# Patient Record
Sex: Male | Born: 1950 | Race: White | Hispanic: No | Marital: Married | Smoking: Former smoker
Health system: Southern US, Community
[De-identification: ages and names within clinical notes are randomized; demographics above are authoritative.]

## PROBLEM LIST (undated history)

## (undated) DIAGNOSIS — M1990 Unspecified osteoarthritis, unspecified site: Secondary | ICD-10-CM

## (undated) DIAGNOSIS — M109 Gout, unspecified: Secondary | ICD-10-CM

## (undated) DIAGNOSIS — H409 Unspecified glaucoma: Secondary | ICD-10-CM

## (undated) DIAGNOSIS — E7800 Pure hypercholesterolemia, unspecified: Secondary | ICD-10-CM

## (undated) DIAGNOSIS — C801 Malignant (primary) neoplasm, unspecified: Secondary | ICD-10-CM

## (undated) DIAGNOSIS — I10 Essential (primary) hypertension: Secondary | ICD-10-CM

## (undated) DIAGNOSIS — L570 Actinic keratosis: Secondary | ICD-10-CM

## (undated) DIAGNOSIS — C61 Malignant neoplasm of prostate: Secondary | ICD-10-CM

## (undated) DIAGNOSIS — E119 Type 2 diabetes mellitus without complications: Secondary | ICD-10-CM

## (undated) HISTORY — PX: PROSTATE SURGERY: SHX751

## (undated) HISTORY — PX: HERNIA REPAIR: SHX51

## (undated) HISTORY — DX: Unspecified osteoarthritis, unspecified site: M19.90

## (undated) HISTORY — PX: OTHER SURGICAL HISTORY: SHX169

## (undated) HISTORY — PX: MENISCUS REPAIR: SHX5179

## (undated) HISTORY — PX: RETINAL DETACHMENT SURGERY: SHX105

## (undated) HISTORY — DX: Actinic keratosis: L57.0

## (undated) HISTORY — PX: EYE SURGERY: SHX253

---

## 2008-10-13 ENCOUNTER — Ambulatory Visit: Payer: Self-pay | Admitting: General Surgery

## 2010-09-15 HISTORY — PX: ROTATOR CUFF REPAIR: SHX139

## 2010-10-15 ENCOUNTER — Ambulatory Visit: Payer: Self-pay | Admitting: Gastroenterology

## 2012-01-13 ENCOUNTER — Ambulatory Visit: Payer: Self-pay | Admitting: Family Medicine

## 2012-09-15 HISTORY — PX: JOINT REPLACEMENT: SHX530

## 2014-03-03 ENCOUNTER — Ambulatory Visit: Payer: Self-pay | Admitting: Family Medicine

## 2014-12-21 ENCOUNTER — Ambulatory Visit
Admit: 2014-12-21 | Disposition: A | Discharge status: Home / Self Care | Payer: Self-pay | Attending: Gastroenterology | Admitting: Gastroenterology

## 2015-01-16 ENCOUNTER — Other Ambulatory Visit: Payer: Self-pay | Admitting: Urology

## 2015-01-16 DIAGNOSIS — C61 Malignant neoplasm of prostate: Secondary | ICD-10-CM

## 2015-01-19 ENCOUNTER — Ambulatory Visit
Admission: RE | Admit: 2015-01-19 | Discharge: 2015-01-19 | Disposition: A | Discharge status: Home / Self Care | Payer: TRICARE For Life (TFL) | Source: Ambulatory Visit | Attending: Urology | Admitting: Urology

## 2015-01-19 DIAGNOSIS — M5137 Other intervertebral disc degeneration, lumbosacral region: Secondary | ICD-10-CM | POA: Insufficient documentation

## 2015-01-19 DIAGNOSIS — C61 Malignant neoplasm of prostate: Secondary | ICD-10-CM | POA: Diagnosis not present

## 2015-01-19 DIAGNOSIS — M47816 Spondylosis without myelopathy or radiculopathy, lumbar region: Secondary | ICD-10-CM | POA: Diagnosis not present

## 2015-01-19 DIAGNOSIS — K760 Fatty (change of) liver, not elsewhere classified: Secondary | ICD-10-CM | POA: Diagnosis not present

## 2015-01-19 MED ORDER — IOHEXOL 300 MG/ML  SOLN
100.0000 mL | Freq: Once | INTRAMUSCULAR | Status: AC | PRN
  Administered 2015-01-19: 100 mL via INTRAVENOUS

## 2015-01-26 ENCOUNTER — Encounter
Admission: RE | Admit: 2015-01-26 | Discharge: 2015-01-26 | Disposition: A | Discharge status: Home / Self Care | Payer: TRICARE For Life (TFL) | Source: Ambulatory Visit | Attending: Urology | Admitting: Urology

## 2015-01-26 DIAGNOSIS — C61 Malignant neoplasm of prostate: Secondary | ICD-10-CM | POA: Insufficient documentation

## 2015-01-26 HISTORY — DX: Malignant (primary) neoplasm, unspecified: C80.1

## 2015-01-26 HISTORY — DX: Type 2 diabetes mellitus without complications: E11.9

## 2015-01-26 MED ORDER — TECHNETIUM TC 99M MEDRONATE IV KIT
22.9780 | PACK | Freq: Once | INTRAVENOUS | Status: AC | PRN
  Administered 2015-01-26: 22.978 via INTRAVENOUS

## 2015-01-29 ENCOUNTER — Ambulatory Visit: Admission: RE | Admit: 2015-01-29 | Payer: Self-pay | Source: Ambulatory Visit

## 2015-02-07 ENCOUNTER — Institutional Professional Consult (permissible substitution): Payer: TRICARE For Life (TFL) | Admitting: Radiation Oncology

## 2015-02-13 ENCOUNTER — Ambulatory Visit
Admission: RE | Admit: 2015-02-13 | Discharge: 2015-02-13 | Disposition: A | Discharge status: Home / Self Care | Payer: TRICARE For Life (TFL) | Source: Ambulatory Visit | Attending: Radiation Oncology | Admitting: Radiation Oncology

## 2015-02-13 ENCOUNTER — Encounter: Payer: Self-pay | Admitting: Radiation Oncology

## 2015-02-13 VITALS — BP 140/83 | HR 70 | Temp 96.9°F | Resp 18 | Wt 204.8 lb

## 2015-02-13 DIAGNOSIS — Z510 Encounter for antineoplastic radiation therapy: Secondary | ICD-10-CM | POA: Insufficient documentation

## 2015-02-13 DIAGNOSIS — C61 Malignant neoplasm of prostate: Secondary | ICD-10-CM

## 2015-02-13 HISTORY — DX: Unspecified glaucoma: H40.9

## 2015-02-13 HISTORY — DX: Gout, unspecified: M10.9

## 2015-02-13 HISTORY — DX: Essential (primary) hypertension: I10

## 2015-02-13 HISTORY — DX: Malignant neoplasm of prostate: C61

## 2015-02-13 HISTORY — DX: Pure hypercholesterolemia, unspecified: E78.00

## 2015-02-13 NOTE — Consult Note (Signed)
Radiation Oncology NEW PATIENT EVALUATION  Name: Troy Christensen  MRN: [id number]  Date:   02/13/2015     DOB: Jan 25, 1951   This 64 y.o. 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  REFERRING PHYSICIAN: Guadalupe Maple, MD  CHIEF COMPLAINT:  Chief Complaint  Patient presents with  . Prostate Cancer    Pt is here for initial evaluation of prostate cancer.     DIAGNOSIS: The encounter diagnosis was Malignant neoplasm of prostate.   PREVIOUS INVESTIGATIONS:  CT scans reviewed bone scan reviewed Pathology report reviewed Clinical notes reviewed  HPI: Patient is a pleasant 64 year old male who presented to his PMD with an elevated PSA of 6.3 on 11/07/2013. He was seen by urology noted to have some minor lower urinary tract symptoms such as weak stream and urgency nocturia 1-2. He went on to have a transrectal ultrasound-guided biopsy showing bilateral adenocarcinoma mostly Gleason 8 (4+4) with also some Gleason 7 and Gleason 6 scores. He underwent a CT scan showing a 41 cc prostate no evidence of extracapsular spread or evidence of pelvic adenopathy or osseous metastatic disease. Bone scan also demonstrated no evidence of metastatic disease have focus of increased uptake in the proximal left femur corresponding to a benign-appearing lesion on recent CT scan. Patient is seen today for evaluation. He specifically denies any bone pain and again lower urinary tract symptoms as described above.  PLANNED TREATMENT REGIMEN: Image guidedIMRT radiation therapy treating both prostate and pelvic nodes with Lupron suppression therapy  PAST MEDICAL HISTORY:  has a past medical history of Cancer; Diabetes mellitus without complication; Prostate cancer; Glaucoma; Hypertension; Hypercholesteremia; and Gout.    PAST SURGICAL HISTORY:  Past Surgical History  Procedure Laterality Date   . Joint replacement  2014    Right Knee Replacement  . Rotator cuff repair Right 2012    FAMILY HISTORY: family history includes Cancer (age of onset: 47) in his father.  SOCIAL HISTORY:  reports that he has quit smoking. He does not have any smokeless tobacco history on file.  ALLERGIES: Naprosyn  MEDICATIONS:  Current Outpatient Prescriptions  Medication Sig Dispense Refill  . allopurinol (ZYLOPRIM) 100 MG tablet Take 100 mg by mouth daily.    Marland Kitchen amLODipine-benazepril (LOTREL) 5-20 MG per capsule Take 1 capsule by mouth daily.    Marland Kitchen aspirin 81 MG tablet Take 81 mg by mouth daily.    Marland Kitchen lovastatin (MEVACOR) 40 MG tablet Take 40 mg by mouth at bedtime.    . meloxicam (MOBIC) 15 MG tablet Take 15 mg by mouth daily.    . metFORMIN (GLUCOPHAGE) 500 MG tablet Take 1,000 mg by mouth 2 (two) times daily with a meal.    . pioglitazone (ACTOS) 45 MG tablet Take 45 mg by mouth daily.    . prenatal vitamin w/FE, FA (PRENATAL 1 + 1) 27-1 MG TABS tablet Take 1 tablet by mouth daily at 12 noon.    . timolol (TIMOPTIC) 0.5 % ophthalmic solution 1 drop 2 (two) times daily.     No current facility-administered medications for this encounter.    ECOG PERFORMANCE STATUS:  0 - Asymptomatic  REVIEW OF SYSTEMS:  Patient denies any weight loss, fatigue, weakness, fever, chills or night sweats. Patient denies any loss of vision, blurred vision. Patient denies any ringing  of the ears or hearing loss. No irregular heartbeat. Patient denies  heart murmur or history of fainting. Patient denies any chest pain or pain radiating to her upper extremities. Patient denies any shortness of breath, difficulty breathing at night, cough or hemoptysis. Patient denies any swelling in the lower legs. Patient denies any nausea vomiting, vomiting of blood, or coffee ground material in the vomitus. Patient denies any stomach pain. Patient states has had normal bowel movements no significant constipation or diarrhea. Patient  denies any dysuria, hematuria or significant nocturia. Patient denies any problems walking, swelling in the joints or loss of balance. Patient denies any skin changes, loss of hair or loss of weight. Patient denies any excessive worrying or anxiety or significant depression. Patient denies any problems with insomnia. Patient denies excessive thirst, polyuria, polydipsia. Patient denies any swollen glands, patient denies easy bruising or easy bleeding. Patient denies any recent infections, allergies or URI. Patient "s visual fields have not changed significantly in recent time.    PHYSICAL EXAM: BP 140/83 mmHg  Pulse 70  Temp(Src) 96.9 F (36.1 C)  Resp 18  Wt 204 lb 12.9 oz (92.9 kg) Well-developed slightly obese male in NAD. On rectal exam rectal sphincter tone is good prostate is firm no evidence of discrete nodularity is noted. Measures of breath approximate 40 cc prostate. Sulcus is preserved bilaterally. No other rectal abnormality is identified. Well-developed well-nourished patient in NAD. HEENT reveals PERLA, EOMI, discs not visualized.  Oral cavity is clear. No oral mucosal lesions are identified. Neck is clear without evidence of cervical or supraclavicular adenopathy. Lungs are clear to A&P. Cardiac examination is essentially unremarkable with regular rate and rhythm without murmur rub or thrill. Abdomen is benign with no organomegaly or masses noted. Motor sensory and DTR levels are equal and symmetric in the upper and lower extremities. Cranial nerves II through XII are grossly intact. Proprioception is intact. No peripheral adenopathy or edema is identified. No motor or sensory levels are noted. Crude visual fields are within normal range.   LABORATORY DATA:  Surgical pathology report of prostate biopsies are reviewed  RADIOLOGY RESULTS: CT scan and bone scan are reviewed IMPRESSION: Stage IIB adenocarcinoma the prostate in 64 year old male  PLAN: At this time I have run his  clinical markers through the [facility] nomogram which shows approximate 70% chance of extracapsular spread of his disease as well as an 18% chance of pelvic lymph node involvement. I believe radiation therapy with image guided technique andIMRT dose painting technique to treat his prostate 8000 cGy as well as his pelvic lymph nodes to 5400 cGy over 8 weeks. I also would like the patient suppressed on Lupron for about a year and a half and have ordered a 4 month Lupron Depo injection. Risks and benefits of treatment including increased lower urinary tract symptoms including frequency urgency and nocturia, possible diarrhea, fatigue, alteration of blood counts, and discussion of erectile dysfunction all were discussed with the patient and his wife. They both seem to comprehend my treatment plan well. I have asked urology to place gold fiduciary markers for image guided treatment. I have set and ordered CT simulation shortly thereafter and have also ordered a Lupron Depo injection.  I would like to take this opportunity for allowing me to participate in the care of your patient.Armstead Peaks., MD

## 2015-02-14 ENCOUNTER — Other Ambulatory Visit: Payer: Self-pay | Admitting: *Deleted

## 2015-02-14 DIAGNOSIS — C61 Malignant neoplasm of prostate: Secondary | ICD-10-CM

## 2015-02-22 ENCOUNTER — Other Ambulatory Visit: Payer: Self-pay | Admitting: *Deleted

## 2015-02-23 ENCOUNTER — Ambulatory Visit
Admission: RE | Admit: 2015-02-23 | Discharge: 2015-02-23 | Disposition: A | Discharge status: Home / Self Care | Payer: TRICARE For Life (TFL) | Source: Ambulatory Visit | Attending: Radiation Oncology | Admitting: Radiation Oncology

## 2015-02-23 ENCOUNTER — Inpatient Hospital Stay: Payer: TRICARE For Life (TFL) | Attending: Radiation Oncology

## 2015-02-23 DIAGNOSIS — Z79818 Long term (current) use of other agents affecting estrogen receptors and estrogen levels: Secondary | ICD-10-CM | POA: Diagnosis not present

## 2015-02-23 DIAGNOSIS — C61 Malignant neoplasm of prostate: Secondary | ICD-10-CM

## 2015-02-23 DIAGNOSIS — Z510 Encounter for antineoplastic radiation therapy: Secondary | ICD-10-CM | POA: Diagnosis present

## 2015-02-23 MED ORDER — LEUPROLIDE ACETATE (4 MONTH) 30 MG IM KIT
30.0000 mg | PACK | Freq: Once | INTRAMUSCULAR | Status: AC
  Administered 2015-02-23: 30 mg via INTRAMUSCULAR
  Filled 2015-02-23: qty 30

## 2015-03-01 DIAGNOSIS — Z510 Encounter for antineoplastic radiation therapy: Secondary | ICD-10-CM | POA: Diagnosis not present

## 2015-03-02 ENCOUNTER — Other Ambulatory Visit: Payer: Self-pay | Admitting: *Deleted

## 2015-03-02 DIAGNOSIS — C61 Malignant neoplasm of prostate: Secondary | ICD-10-CM

## 2015-03-05 ENCOUNTER — Other Ambulatory Visit: Payer: Self-pay

## 2015-03-05 DIAGNOSIS — A048 Other specified bacterial intestinal infections: Secondary | ICD-10-CM

## 2015-03-06 ENCOUNTER — Ambulatory Visit
Admission: RE | Admit: 2015-03-06 | Discharge: 2015-03-06 | Disposition: A | Discharge status: Home / Self Care | Payer: TRICARE For Life (TFL) | Source: Ambulatory Visit | Attending: Radiation Oncology | Admitting: Radiation Oncology

## 2015-03-06 DIAGNOSIS — Z510 Encounter for antineoplastic radiation therapy: Secondary | ICD-10-CM | POA: Diagnosis not present

## 2015-03-07 ENCOUNTER — Other Ambulatory Visit: Payer: Self-pay | Admitting: Family Medicine

## 2015-03-07 ENCOUNTER — Ambulatory Visit
Admission: RE | Admit: 2015-03-07 | Discharge: 2015-03-07 | Disposition: A | Discharge status: Home / Self Care | Payer: TRICARE For Life (TFL) | Source: Ambulatory Visit | Attending: Radiation Oncology | Admitting: Radiation Oncology

## 2015-03-07 DIAGNOSIS — Z510 Encounter for antineoplastic radiation therapy: Secondary | ICD-10-CM | POA: Diagnosis not present

## 2015-03-07 MED ORDER — LOVASTATIN 40 MG PO TABS: 40.0000 mg | ORAL_TABLET | Freq: Every day | ORAL | Status: DC

## 2015-03-08 ENCOUNTER — Ambulatory Visit
Admission: RE | Admit: 2015-03-08 | Discharge: 2015-03-08 | Disposition: A | Discharge status: Home / Self Care | Payer: TRICARE For Life (TFL) | Source: Ambulatory Visit | Attending: Radiation Oncology | Admitting: Radiation Oncology

## 2015-03-08 DIAGNOSIS — Z510 Encounter for antineoplastic radiation therapy: Secondary | ICD-10-CM | POA: Diagnosis not present

## 2015-03-09 ENCOUNTER — Ambulatory Visit
Admission: RE | Admit: 2015-03-09 | Discharge: 2015-03-09 | Disposition: A | Discharge status: Home / Self Care | Payer: TRICARE For Life (TFL) | Source: Ambulatory Visit | Attending: Radiation Oncology | Admitting: Radiation Oncology

## 2015-03-09 DIAGNOSIS — Z510 Encounter for antineoplastic radiation therapy: Secondary | ICD-10-CM | POA: Diagnosis not present

## 2015-03-12 ENCOUNTER — Ambulatory Visit
Admission: RE | Admit: 2015-03-12 | Discharge: 2015-03-12 | Disposition: A | Discharge status: Home / Self Care | Payer: TRICARE For Life (TFL) | Source: Ambulatory Visit | Attending: Radiation Oncology | Admitting: Radiation Oncology

## 2015-03-12 DIAGNOSIS — Z510 Encounter for antineoplastic radiation therapy: Secondary | ICD-10-CM | POA: Diagnosis not present

## 2015-03-13 ENCOUNTER — Ambulatory Visit
Admission: RE | Admit: 2015-03-13 | Discharge: 2015-03-13 | Disposition: A | Discharge status: Home / Self Care | Payer: TRICARE For Life (TFL) | Source: Ambulatory Visit | Attending: Radiation Oncology | Admitting: Radiation Oncology

## 2015-03-13 DIAGNOSIS — Z510 Encounter for antineoplastic radiation therapy: Secondary | ICD-10-CM | POA: Diagnosis not present

## 2015-03-14 ENCOUNTER — Ambulatory Visit
Admission: RE | Admit: 2015-03-14 | Discharge: 2015-03-14 | Disposition: A | Discharge status: Home / Self Care | Payer: TRICARE For Life (TFL) | Source: Ambulatory Visit | Attending: Radiation Oncology | Admitting: Radiation Oncology

## 2015-03-14 DIAGNOSIS — Z510 Encounter for antineoplastic radiation therapy: Secondary | ICD-10-CM | POA: Diagnosis not present

## 2015-03-15 ENCOUNTER — Ambulatory Visit
Admission: RE | Admit: 2015-03-15 | Discharge: 2015-03-15 | Disposition: A | Discharge status: Home / Self Care | Payer: TRICARE For Life (TFL) | Source: Ambulatory Visit | Attending: Radiation Oncology | Admitting: Radiation Oncology

## 2015-03-15 DIAGNOSIS — Z510 Encounter for antineoplastic radiation therapy: Secondary | ICD-10-CM | POA: Diagnosis not present

## 2015-03-16 ENCOUNTER — Ambulatory Visit
Admission: RE | Admit: 2015-03-16 | Discharge: 2015-03-16 | Disposition: A | Discharge status: Home / Self Care | Payer: TRICARE For Life (TFL) | Source: Ambulatory Visit | Attending: Radiation Oncology | Admitting: Radiation Oncology

## 2015-03-16 DIAGNOSIS — Z510 Encounter for antineoplastic radiation therapy: Secondary | ICD-10-CM | POA: Diagnosis not present

## 2015-03-20 ENCOUNTER — Ambulatory Visit
Admission: RE | Admit: 2015-03-20 | Discharge: 2015-03-20 | Disposition: A | Discharge status: Home / Self Care | Payer: TRICARE For Life (TFL) | Source: Ambulatory Visit | Attending: Radiation Oncology | Admitting: Radiation Oncology

## 2015-03-20 DIAGNOSIS — Z510 Encounter for antineoplastic radiation therapy: Secondary | ICD-10-CM | POA: Diagnosis not present

## 2015-03-21 ENCOUNTER — Inpatient Hospital Stay: Attending: Radiation Oncology

## 2015-03-21 ENCOUNTER — Ambulatory Visit
Admission: RE | Admit: 2015-03-21 | Discharge: 2015-03-21 | Disposition: A | Discharge status: Home / Self Care | Payer: TRICARE For Life (TFL) | Source: Ambulatory Visit | Attending: Radiation Oncology | Admitting: Radiation Oncology

## 2015-03-21 DIAGNOSIS — E669 Obesity, unspecified: Secondary | ICD-10-CM | POA: Insufficient documentation

## 2015-03-21 DIAGNOSIS — C61 Malignant neoplasm of prostate: Secondary | ICD-10-CM | POA: Diagnosis not present

## 2015-03-21 DIAGNOSIS — Z510 Encounter for antineoplastic radiation therapy: Secondary | ICD-10-CM | POA: Diagnosis not present

## 2015-03-21 DIAGNOSIS — E1169 Type 2 diabetes mellitus with other specified complication: Secondary | ICD-10-CM

## 2015-03-21 DIAGNOSIS — M1990 Unspecified osteoarthritis, unspecified site: Secondary | ICD-10-CM

## 2015-03-21 DIAGNOSIS — E785 Hyperlipidemia, unspecified: Secondary | ICD-10-CM

## 2015-03-21 DIAGNOSIS — Z125 Encounter for screening for malignant neoplasm of prostate: Secondary | ICD-10-CM

## 2015-03-21 DIAGNOSIS — IMO0002 Reserved for concepts with insufficient information to code with codable children: Secondary | ICD-10-CM | POA: Insufficient documentation

## 2015-03-21 DIAGNOSIS — D500 Iron deficiency anemia secondary to blood loss (chronic): Secondary | ICD-10-CM

## 2015-03-21 DIAGNOSIS — H40019 Open angle with borderline findings, low risk, unspecified eye: Secondary | ICD-10-CM

## 2015-03-21 DIAGNOSIS — E119 Type 2 diabetes mellitus without complications: Secondary | ICD-10-CM

## 2015-03-21 DIAGNOSIS — M75110 Incomplete rotator cuff tear or rupture of unspecified shoulder, not specified as traumatic: Secondary | ICD-10-CM

## 2015-03-21 DIAGNOSIS — E1159 Type 2 diabetes mellitus with other circulatory complications: Secondary | ICD-10-CM

## 2015-03-21 DIAGNOSIS — I10 Essential (primary) hypertension: Secondary | ICD-10-CM

## 2015-03-21 LAB — CBC
HCT: 39.1 % — ABNORMAL LOW (ref 40.0–52.0)
Hemoglobin: 12.8 g/dL — ABNORMAL LOW (ref 13.0–18.0)
MCH: 29.5 pg (ref 26.0–34.0)
MCHC: 32.8 g/dL (ref 32.0–36.0)
MCV: 89.9 fL (ref 80.0–100.0)
PLATELETS: 150 10*3/uL (ref 150–440)
RBC: 4.35 MIL/uL — AB (ref 4.40–5.90)
RDW: 17.6 % — ABNORMAL HIGH (ref 11.5–14.5)
WBC: 3 10*3/uL — AB (ref 3.8–10.6)

## 2015-03-22 ENCOUNTER — Ambulatory Visit
Admission: RE | Admit: 2015-03-22 | Discharge: 2015-03-22 | Disposition: A | Discharge status: Home / Self Care | Payer: TRICARE For Life (TFL) | Source: Ambulatory Visit | Attending: Radiation Oncology | Admitting: Radiation Oncology

## 2015-03-22 DIAGNOSIS — Z510 Encounter for antineoplastic radiation therapy: Secondary | ICD-10-CM | POA: Diagnosis not present

## 2015-03-23 ENCOUNTER — Ambulatory Visit
Admission: RE | Admit: 2015-03-23 | Discharge: 2015-03-23 | Disposition: A | Discharge status: Home / Self Care | Payer: TRICARE For Life (TFL) | Source: Ambulatory Visit | Attending: Radiation Oncology | Admitting: Radiation Oncology

## 2015-03-23 DIAGNOSIS — Z510 Encounter for antineoplastic radiation therapy: Secondary | ICD-10-CM | POA: Diagnosis not present

## 2015-03-26 ENCOUNTER — Ambulatory Visit
Admission: RE | Admit: 2015-03-26 | Discharge: 2015-03-26 | Disposition: A | Discharge status: Home / Self Care | Payer: TRICARE For Life (TFL) | Source: Ambulatory Visit | Attending: Radiation Oncology | Admitting: Radiation Oncology

## 2015-03-26 ENCOUNTER — Ambulatory Visit (INDEPENDENT_AMBULATORY_CARE_PROVIDER_SITE_OTHER): Payer: TRICARE For Life (TFL) | Admitting: Family Medicine

## 2015-03-26 ENCOUNTER — Encounter: Payer: Self-pay | Admitting: Family Medicine

## 2015-03-26 VITALS — BP 122/76 | HR 60 | Temp 98.6°F | Ht 70.0 in | Wt 197.0 lb

## 2015-03-26 DIAGNOSIS — I10 Essential (primary) hypertension: Secondary | ICD-10-CM

## 2015-03-26 DIAGNOSIS — Z510 Encounter for antineoplastic radiation therapy: Secondary | ICD-10-CM | POA: Diagnosis not present

## 2015-03-26 DIAGNOSIS — E119 Type 2 diabetes mellitus without complications: Secondary | ICD-10-CM

## 2015-03-26 DIAGNOSIS — D500 Iron deficiency anemia secondary to blood loss (chronic): Secondary | ICD-10-CM | POA: Diagnosis not present

## 2015-03-26 LAB — CBC WITH DIFFERENTIAL/PLATELET
HEMATOCRIT: 38.1 % (ref 37.5–51.0)
Hemoglobin: 13.1 g/dL (ref 12.6–17.7)
LYMPHS ABS: 0.9 10*3/uL (ref 0.7–3.1)
Lymphs: 28 %
MCH: 30.5 pg (ref 26.6–33.0)
MCHC: 34.4 g/dL (ref 31.5–35.7)
MCV: 89 fL (ref 79–97)
MID (ABSOLUTE): 0.5 10*3/uL (ref 0.1–1.6)
MID: 16 %
Neutrophils Absolute: 1.8 10*3/uL (ref 1.4–7.0)
Neutrophils: 56 %
PLATELETS: 146 10*3/uL — AB (ref 150–379)
RBC: 4.3 x10E6/uL (ref 4.14–5.80)
RDW: 16.6 % — AB (ref 12.3–15.4)
WBC: 3.2 10*3/uL — AB (ref 3.4–10.8)

## 2015-03-26 LAB — BAYER DCA HB A1C WAIVED: HB A1C (BAYER DCA - WAIVED): 7.4 % — ABNORMAL HIGH (ref <7.0)

## 2015-03-26 NOTE — Assessment & Plan Note (Signed)
The current medical regimen is effective;  continue present plan and medications.  

## 2015-03-26 NOTE — Assessment & Plan Note (Signed)
Resolved will cont iron for 6 more mo

## 2015-03-26 NOTE — Progress Notes (Signed)
BP 122/76 mmHg  Pulse 60  Temp(Src) 98.6 F (37 C)  Ht 5\' 10"  (1.778 m)  Wt 197 lb (89.359 kg)  BMI 28.27 kg/m2  SpO2 99%   Subjective:    Patient ID: Troy Christensen, male    DOB: 06/23/1951, 64 y.o.   MRN: [id number]  HPI: Troy Christensen is a 64 y.o. male  Chief Complaint  Patient presents with  . Anemia  . Diabetes  . Hypertension  getting radiation for prostate and doing well # 14 today Has continued to work on diet and modest exercise No low glu spells Also checking Fe deficiency from ulcer resolving and still taking vit with iron meds no side effects  Relevant past medical, surgical, family and social history reviewed and updated as indicated. Interim medical history since our last visit reviewed. Allergies and medications reviewed and updated.  Review of Systems  Constitutional: Negative.   Respiratory: Negative.   Cardiovascular: Negative.     Per HPI unless specifically indicated above     Objective:    BP 122/76 mmHg  Pulse 60  Temp(Src) 98.6 F (37 C)  Ht 5\' 10"  (1.778 m)  Wt 197 lb (89.359 kg)  BMI 28.27 kg/m2  SpO2 99%  Wt Readings from Last 3 Encounters:  03/26/15 197 lb (89.359 kg)  01/03/15 203 lb (92.08 kg)  02/13/15 204 lb 12.9 oz (92.9 kg)    Physical Exam  Constitutional: He is oriented to person, place, and time. He appears well-developed and well-nourished. No distress.  HENT:  Head: Normocephalic and atraumatic.  Right Ear: Hearing normal.  Left Ear: Hearing normal.  Nose: Nose normal.  Eyes: Conjunctivae and lids are normal. Right eye exhibits no discharge. Left eye exhibits no discharge. No scleral icterus.  Pulmonary/Chest: Effort normal. No respiratory distress.  Musculoskeletal: Normal range of motion.  Neurological: He is alert and oriented to person, place, and time.  Skin: Skin is intact. No rash noted.  Psychiatric: He has a normal mood and affect. His speech is normal and behavior is normal. Judgment and thought  content normal. Cognition and memory are normal.    Results for orders placed or performed in visit on 03/21/15  CBC  Result Value Ref Range   WBC 3.0 (L) 3.8 - 10.6 K/uL   RBC 4.35 (L) 4.40 - 5.90 MIL/uL   Hemoglobin 12.8 (L) 13.0 - 18.0 g/dL   HCT 39.1 (L) 40.0 - 52.0 %   MCV 89.9 80.0 - 100.0 fL   MCH 29.5 26.0 - 34.0 pg   MCHC 32.8 32.0 - 36.0 g/dL   RDW 17.6 (H) 11.5 - 14.5 %   Platelets 150 150 - 440 K/uL      Assessment & Plan:   Problem List Items Addressed This Visit      Cardiovascular and Mediastinum   Hypertension    The current medical regimen is effective;  continue present plan and medications.         Endocrine   Diabetes mellitus    The current medical regimen is effective;  continue present plan and medications.         Other   Blood loss anemia - Primary    Resolved will cont iron for 6 more mo      Relevant Orders   CBC With Differential/Platelet    Other Visit Diagnoses    Diabetes mellitus without complication        Relevant Orders    Bayer DCA Hb A1c  Waived    Essential hypertension, benign        Relevant Orders    Basic metabolic panel        Follow up plan: Return in about 3 months (around 06/26/2015) for recheck DM A1C.

## 2015-03-27 ENCOUNTER — Ambulatory Visit
Admission: RE | Admit: 2015-03-27 | Discharge: 2015-03-27 | Disposition: A | Discharge status: Home / Self Care | Payer: TRICARE For Life (TFL) | Source: Ambulatory Visit | Attending: Radiation Oncology | Admitting: Radiation Oncology

## 2015-03-27 DIAGNOSIS — Z510 Encounter for antineoplastic radiation therapy: Secondary | ICD-10-CM | POA: Diagnosis not present

## 2015-03-27 LAB — BASIC METABOLIC PANEL
BUN/Creatinine Ratio: 18 (ref 10–22)
BUN: 18 mg/dL (ref 8–27)
CALCIUM: 9.5 mg/dL (ref 8.6–10.2)
CO2: 22 mmol/L (ref 18–29)
Chloride: 98 mmol/L (ref 97–108)
Creatinine, Ser: 0.98 mg/dL (ref 0.76–1.27)
GFR calc Af Amer: 94 mL/min/{1.73_m2} (ref >59)
GFR calc non Af Amer: 82 mL/min/{1.73_m2} (ref >59)
Glucose: 145 mg/dL — ABNORMAL HIGH (ref 65–99)
Potassium: 4.8 mmol/L (ref 3.5–5.2)
Sodium: 139 mmol/L (ref 134–144)

## 2015-03-28 ENCOUNTER — Ambulatory Visit
Admission: RE | Admit: 2015-03-28 | Discharge: 2015-03-28 | Disposition: A | Discharge status: Home / Self Care | Payer: TRICARE For Life (TFL) | Source: Ambulatory Visit | Attending: Radiation Oncology | Admitting: Radiation Oncology

## 2015-03-28 DIAGNOSIS — Z510 Encounter for antineoplastic radiation therapy: Secondary | ICD-10-CM | POA: Diagnosis not present

## 2015-03-29 ENCOUNTER — Ambulatory Visit
Admission: RE | Admit: 2015-03-29 | Discharge: 2015-03-29 | Disposition: A | Discharge status: Home / Self Care | Payer: TRICARE For Life (TFL) | Source: Ambulatory Visit | Attending: Radiation Oncology | Admitting: Radiation Oncology

## 2015-03-29 DIAGNOSIS — Z510 Encounter for antineoplastic radiation therapy: Secondary | ICD-10-CM | POA: Diagnosis not present

## 2015-03-30 ENCOUNTER — Ambulatory Visit
Admission: RE | Admit: 2015-03-30 | Discharge: 2015-03-30 | Disposition: A | Discharge status: Home / Self Care | Payer: TRICARE For Life (TFL) | Source: Ambulatory Visit | Attending: Radiation Oncology | Admitting: Radiation Oncology

## 2015-03-30 ENCOUNTER — Other Ambulatory Visit: Payer: Self-pay | Admitting: Family Medicine

## 2015-03-30 DIAGNOSIS — Z510 Encounter for antineoplastic radiation therapy: Secondary | ICD-10-CM | POA: Diagnosis not present

## 2015-04-02 ENCOUNTER — Ambulatory Visit
Admission: RE | Admit: 2015-04-02 | Discharge: 2015-04-02 | Disposition: A | Discharge status: Home / Self Care | Payer: TRICARE For Life (TFL) | Source: Ambulatory Visit | Attending: Radiation Oncology | Admitting: Radiation Oncology

## 2015-04-02 DIAGNOSIS — Z510 Encounter for antineoplastic radiation therapy: Secondary | ICD-10-CM | POA: Diagnosis not present

## 2015-04-03 ENCOUNTER — Ambulatory Visit
Admission: RE | Admit: 2015-04-03 | Discharge: 2015-04-03 | Disposition: A | Discharge status: Home / Self Care | Payer: TRICARE For Life (TFL) | Source: Ambulatory Visit | Attending: Radiation Oncology | Admitting: Radiation Oncology

## 2015-04-03 DIAGNOSIS — Z510 Encounter for antineoplastic radiation therapy: Secondary | ICD-10-CM | POA: Diagnosis not present

## 2015-04-04 ENCOUNTER — Inpatient Hospital Stay

## 2015-04-04 ENCOUNTER — Ambulatory Visit
Admission: RE | Admit: 2015-04-04 | Discharge: 2015-04-04 | Disposition: A | Discharge status: Home / Self Care | Payer: TRICARE For Life (TFL) | Source: Ambulatory Visit | Attending: Radiation Oncology | Admitting: Radiation Oncology

## 2015-04-04 DIAGNOSIS — C61 Malignant neoplasm of prostate: Secondary | ICD-10-CM | POA: Diagnosis not present

## 2015-04-04 LAB — CBC
HCT: 38.1 % — ABNORMAL LOW (ref 40.0–52.0)
Hemoglobin: 12.8 g/dL — ABNORMAL LOW (ref 13.0–18.0)
MCH: 30.2 pg (ref 26.0–34.0)
MCHC: 33.6 g/dL (ref 32.0–36.0)
MCV: 89.9 fL (ref 80.0–100.0)
PLATELETS: 159 10*3/uL (ref 150–440)
RBC: 4.24 MIL/uL — ABNORMAL LOW (ref 4.40–5.90)
RDW: 17.3 % — ABNORMAL HIGH (ref 11.5–14.5)
WBC: 3.1 10*3/uL — ABNORMAL LOW (ref 3.8–10.6)

## 2015-04-05 ENCOUNTER — Ambulatory Visit
Admission: RE | Admit: 2015-04-05 | Discharge: 2015-04-05 | Disposition: A | Discharge status: Home / Self Care | Payer: TRICARE For Life (TFL) | Source: Ambulatory Visit | Attending: Radiation Oncology | Admitting: Radiation Oncology

## 2015-04-05 DIAGNOSIS — Z510 Encounter for antineoplastic radiation therapy: Secondary | ICD-10-CM | POA: Diagnosis not present

## 2015-04-06 ENCOUNTER — Ambulatory Visit
Admission: RE | Admit: 2015-04-06 | Discharge: 2015-04-06 | Disposition: A | Discharge status: Home / Self Care | Payer: TRICARE For Life (TFL) | Source: Ambulatory Visit | Attending: Radiation Oncology | Admitting: Radiation Oncology

## 2015-04-06 DIAGNOSIS — Z510 Encounter for antineoplastic radiation therapy: Secondary | ICD-10-CM | POA: Diagnosis not present

## 2015-04-09 ENCOUNTER — Ambulatory Visit
Admission: RE | Admit: 2015-04-09 | Discharge: 2015-04-09 | Disposition: A | Discharge status: Home / Self Care | Payer: TRICARE For Life (TFL) | Source: Ambulatory Visit | Attending: Radiation Oncology | Admitting: Radiation Oncology

## 2015-04-09 DIAGNOSIS — Z510 Encounter for antineoplastic radiation therapy: Secondary | ICD-10-CM | POA: Diagnosis not present

## 2015-04-10 ENCOUNTER — Ambulatory Visit
Admission: RE | Admit: 2015-04-10 | Discharge: 2015-04-10 | Disposition: A | Discharge status: Home / Self Care | Payer: TRICARE For Life (TFL) | Source: Ambulatory Visit | Attending: Radiation Oncology | Admitting: Radiation Oncology

## 2015-04-10 DIAGNOSIS — Z510 Encounter for antineoplastic radiation therapy: Secondary | ICD-10-CM | POA: Diagnosis not present

## 2015-04-11 ENCOUNTER — Ambulatory Visit
Admission: RE | Admit: 2015-04-11 | Discharge: 2015-04-11 | Disposition: A | Discharge status: Home / Self Care | Payer: TRICARE For Life (TFL) | Source: Ambulatory Visit | Attending: Radiation Oncology | Admitting: Radiation Oncology

## 2015-04-11 DIAGNOSIS — Z510 Encounter for antineoplastic radiation therapy: Secondary | ICD-10-CM | POA: Diagnosis not present

## 2015-04-12 ENCOUNTER — Ambulatory Visit
Admission: RE | Admit: 2015-04-12 | Discharge: 2015-04-12 | Disposition: A | Discharge status: Home / Self Care | Payer: TRICARE For Life (TFL) | Source: Ambulatory Visit | Attending: Radiation Oncology | Admitting: Radiation Oncology

## 2015-04-12 DIAGNOSIS — Z510 Encounter for antineoplastic radiation therapy: Secondary | ICD-10-CM | POA: Diagnosis not present

## 2015-04-13 ENCOUNTER — Ambulatory Visit
Admission: RE | Admit: 2015-04-13 | Discharge: 2015-04-13 | Disposition: A | Discharge status: Home / Self Care | Payer: TRICARE For Life (TFL) | Source: Ambulatory Visit | Attending: Radiation Oncology | Admitting: Radiation Oncology

## 2015-04-13 DIAGNOSIS — Z510 Encounter for antineoplastic radiation therapy: Secondary | ICD-10-CM | POA: Diagnosis not present

## 2015-04-16 ENCOUNTER — Ambulatory Visit
Admission: RE | Admit: 2015-04-16 | Discharge: 2015-04-16 | Disposition: A | Discharge status: Home / Self Care | Payer: TRICARE For Life (TFL) | Source: Ambulatory Visit | Attending: Radiation Oncology | Admitting: Radiation Oncology

## 2015-04-16 DIAGNOSIS — Z510 Encounter for antineoplastic radiation therapy: Secondary | ICD-10-CM | POA: Diagnosis not present

## 2015-04-17 ENCOUNTER — Ambulatory Visit
Admission: RE | Admit: 2015-04-17 | Discharge: 2015-04-17 | Disposition: A | Discharge status: Home / Self Care | Payer: TRICARE For Life (TFL) | Source: Ambulatory Visit | Attending: Radiation Oncology | Admitting: Radiation Oncology

## 2015-04-17 DIAGNOSIS — Z510 Encounter for antineoplastic radiation therapy: Secondary | ICD-10-CM | POA: Diagnosis not present

## 2015-04-18 ENCOUNTER — Inpatient Hospital Stay: Attending: Radiation Oncology

## 2015-04-18 ENCOUNTER — Ambulatory Visit
Admission: RE | Admit: 2015-04-18 | Discharge: 2015-04-18 | Disposition: A | Discharge status: Home / Self Care | Payer: TRICARE For Life (TFL) | Source: Ambulatory Visit | Attending: Radiation Oncology | Admitting: Radiation Oncology

## 2015-04-18 DIAGNOSIS — C61 Malignant neoplasm of prostate: Secondary | ICD-10-CM

## 2015-04-18 DIAGNOSIS — Z510 Encounter for antineoplastic radiation therapy: Secondary | ICD-10-CM | POA: Diagnosis not present

## 2015-04-18 LAB — CBC
HCT: 35.5 % — ABNORMAL LOW (ref 40.0–52.0)
Hemoglobin: 12.3 g/dL — ABNORMAL LOW (ref 13.0–18.0)
MCH: 31 pg (ref 26.0–34.0)
MCHC: 34.6 g/dL (ref 32.0–36.0)
MCV: 89.6 fL (ref 80.0–100.0)
Platelets: 164 10*3/uL (ref 150–440)
RBC: 3.96 MIL/uL — ABNORMAL LOW (ref 4.40–5.90)
RDW: 16.4 % — ABNORMAL HIGH (ref 11.5–14.5)
WBC: 3.6 10*3/uL — ABNORMAL LOW (ref 3.8–10.6)

## 2015-04-19 ENCOUNTER — Ambulatory Visit
Admission: RE | Admit: 2015-04-19 | Discharge: 2015-04-19 | Disposition: A | Discharge status: Home / Self Care | Payer: TRICARE For Life (TFL) | Source: Ambulatory Visit | Attending: Radiation Oncology | Admitting: Radiation Oncology

## 2015-04-19 DIAGNOSIS — Z510 Encounter for antineoplastic radiation therapy: Secondary | ICD-10-CM | POA: Diagnosis not present

## 2015-04-20 ENCOUNTER — Ambulatory Visit
Admission: RE | Admit: 2015-04-20 | Discharge: 2015-04-20 | Disposition: A | Discharge status: Home / Self Care | Payer: TRICARE For Life (TFL) | Source: Ambulatory Visit | Attending: Radiation Oncology | Admitting: Radiation Oncology

## 2015-04-20 DIAGNOSIS — Z510 Encounter for antineoplastic radiation therapy: Secondary | ICD-10-CM | POA: Diagnosis not present

## 2015-04-23 ENCOUNTER — Ambulatory Visit
Admission: RE | Admit: 2015-04-23 | Discharge: 2015-04-23 | Disposition: A | Discharge status: Home / Self Care | Payer: TRICARE For Life (TFL) | Source: Ambulatory Visit | Attending: Radiation Oncology | Admitting: Radiation Oncology

## 2015-04-23 DIAGNOSIS — Z510 Encounter for antineoplastic radiation therapy: Secondary | ICD-10-CM | POA: Diagnosis not present

## 2015-04-24 ENCOUNTER — Ambulatory Visit
Admission: RE | Admit: 2015-04-24 | Discharge: 2015-04-24 | Disposition: A | Discharge status: Home / Self Care | Payer: TRICARE For Life (TFL) | Source: Ambulatory Visit | Attending: Radiation Oncology | Admitting: Radiation Oncology

## 2015-04-24 DIAGNOSIS — Z510 Encounter for antineoplastic radiation therapy: Secondary | ICD-10-CM | POA: Diagnosis not present

## 2015-04-25 ENCOUNTER — Ambulatory Visit
Admission: RE | Admit: 2015-04-25 | Discharge: 2015-04-25 | Disposition: A | Discharge status: Home / Self Care | Payer: TRICARE For Life (TFL) | Source: Ambulatory Visit | Attending: Radiation Oncology | Admitting: Radiation Oncology

## 2015-04-25 DIAGNOSIS — Z510 Encounter for antineoplastic radiation therapy: Secondary | ICD-10-CM | POA: Diagnosis not present

## 2015-04-26 ENCOUNTER — Ambulatory Visit: Payer: TRICARE For Life (TFL)

## 2015-04-27 ENCOUNTER — Ambulatory Visit
Admission: RE | Admit: 2015-04-27 | Discharge: 2015-04-27 | Disposition: A | Discharge status: Home / Self Care | Payer: TRICARE For Life (TFL) | Source: Ambulatory Visit | Attending: Radiation Oncology | Admitting: Radiation Oncology

## 2015-04-27 DIAGNOSIS — Z510 Encounter for antineoplastic radiation therapy: Secondary | ICD-10-CM | POA: Diagnosis not present

## 2015-04-30 ENCOUNTER — Ambulatory Visit
Admission: RE | Admit: 2015-04-30 | Discharge: 2015-04-30 | Disposition: A | Discharge status: Home / Self Care | Payer: TRICARE For Life (TFL) | Source: Ambulatory Visit | Attending: Radiation Oncology | Admitting: Radiation Oncology

## 2015-04-30 DIAGNOSIS — Z510 Encounter for antineoplastic radiation therapy: Secondary | ICD-10-CM | POA: Diagnosis not present

## 2015-05-01 ENCOUNTER — Ambulatory Visit
Admission: RE | Admit: 2015-05-01 | Discharge: 2015-05-01 | Disposition: A | Discharge status: Home / Self Care | Payer: TRICARE For Life (TFL) | Source: Ambulatory Visit | Attending: Radiation Oncology | Admitting: Radiation Oncology

## 2015-05-01 DIAGNOSIS — Z510 Encounter for antineoplastic radiation therapy: Secondary | ICD-10-CM | POA: Diagnosis not present

## 2015-05-02 ENCOUNTER — Ambulatory Visit
Admission: RE | Admit: 2015-05-02 | Discharge: 2015-05-02 | Disposition: A | Discharge status: Home / Self Care | Payer: TRICARE For Life (TFL) | Source: Ambulatory Visit | Attending: Radiation Oncology | Admitting: Radiation Oncology

## 2015-05-02 DIAGNOSIS — Z510 Encounter for antineoplastic radiation therapy: Secondary | ICD-10-CM | POA: Diagnosis not present

## 2015-05-03 ENCOUNTER — Ambulatory Visit: Payer: TRICARE For Life (TFL)

## 2015-05-03 ENCOUNTER — Ambulatory Visit
Admission: RE | Admit: 2015-05-03 | Discharge: 2015-05-03 | Disposition: A | Discharge status: Home / Self Care | Payer: TRICARE For Life (TFL) | Source: Ambulatory Visit | Attending: Radiation Oncology | Admitting: Radiation Oncology

## 2015-05-03 DIAGNOSIS — Z510 Encounter for antineoplastic radiation therapy: Secondary | ICD-10-CM | POA: Diagnosis not present

## 2015-05-04 ENCOUNTER — Ambulatory Visit
Admission: RE | Admit: 2015-05-04 | Discharge: 2015-05-04 | Disposition: A | Discharge status: Home / Self Care | Payer: TRICARE For Life (TFL) | Source: Ambulatory Visit | Attending: Radiation Oncology | Admitting: Radiation Oncology

## 2015-05-04 DIAGNOSIS — Z510 Encounter for antineoplastic radiation therapy: Secondary | ICD-10-CM | POA: Diagnosis not present

## 2015-05-30 ENCOUNTER — Ambulatory Visit (INDEPENDENT_AMBULATORY_CARE_PROVIDER_SITE_OTHER): Payer: TRICARE For Life (TFL) | Admitting: Family Medicine

## 2015-05-30 ENCOUNTER — Encounter: Payer: Self-pay | Admitting: Family Medicine

## 2015-05-30 VITALS — BP 133/78 | HR 73 | Temp 98.6°F | Ht 69.5 in | Wt 192.0 lb

## 2015-05-30 DIAGNOSIS — E119 Type 2 diabetes mellitus without complications: Secondary | ICD-10-CM

## 2015-05-30 DIAGNOSIS — L259 Unspecified contact dermatitis, unspecified cause: Secondary | ICD-10-CM | POA: Diagnosis not present

## 2015-05-30 MED ORDER — PREDNISONE 10 MG (21) PO TBPK: ORAL_TABLET | ORAL | Status: DC

## 2015-05-30 NOTE — Progress Notes (Signed)
BP 133/78 mmHg  Pulse 73  Temp(Src) 98.6 F (37 C)  Ht 5' 9.5" (1.765 m)  Wt 192 lb (87.091 kg)  BMI 27.96 kg/m2  SpO2 99%   Subjective:    Patient ID: Troy Christensen, male    DOB: 1951-05-08, 64 y.o.   MRN: [id number]  HPI: Troy Christensen is a 64 y.o. male  Chief Complaint  Patient presents with  . Poison Karlene Einstein    pt states he has had this before so he has been trying to treat it himself for about a week now but has not gotten better.   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 Using sarna and allergy medicine to help keep form itching He got into it mowing a week ago he thinks It started on the left ankle, large blister; he thinks he had long pants when he mowed One on the abdomen right of midline; one on the buttock, clearing up No joint pain, nothing out of the ordinary No fevers He thinks it has gotten in his bloodstream Started out on the left foot Then these places have popped up on the left leg further up Has large blister on the left foot; had large blisters in the 4th grade, typical for him for poison oak He has not had any other rounds of prednisone for a year  I brought up his weight change; he was as heavy as he's ever been in February; he would like his weight to be like it is now or 5-6 pounds lighter; he got down to 187 pounds after radiation treatment; finished it up; no appetite, some things did not taste, not eating as much; has f/u with cancer doctor at [facility] soon  Relevant past medical, surgical, family and social history reviewed and updated as indicated. Interim medical history since our last visit reviewed. Allergies and medications reviewed and updated.  Review of Systems  Per HPI unless specifically indicated above     Objective:    BP 133/78 mmHg  Pulse 73  Temp(Src) 98.6 F (37 C)  Ht 5' 9.5" (1.765 m)  Wt 192 lb (87.091 kg)  BMI 27.96 kg/m2  SpO2 99%  Wt Readings from Last 3 Encounters:  05/30/15 192 lb (87.091 kg)  03/26/15  197 lb (89.359 kg)  01/03/15 203 lb (92.08 kg)    Physical Exam  Constitutional: He appears well-developed and well-nourished.  Cardiovascular: Normal rate.   Pulmonary/Chest: Effort normal.  Skin: Rash noted.  Clear blister on the medial instep of the left foot, dime-sized; several dark erythematous papules scattered on the arms, legs; cluster of 3 on medial left ankle; nonblanching; slight halo around a few; no purulence  Psychiatric: He has a normal mood and affect.    Results for orders placed or performed in visit on 04/18/15  CBC  Result Value Ref Range   WBC 3.6 (L) 3.8 - 10.6 K/uL   RBC 3.96 (L) 4.40 - 5.90 MIL/uL   Hemoglobin 12.3 (L) 13.0 - 18.0 g/dL   HCT 35.5 (L) 40.0 - 52.0 %   MCV 89.6 80.0 - 100.0 fL   MCH 31.0 26.0 - 34.0 pg   MCHC 34.6 32.0 - 36.0 g/dL   RDW 16.4 (H) 11.5 - 14.5 %   Platelets 164 150 - 440 K/uL      Assessment & Plan:   Problem List Items Addressed This Visit      Endocrine   Diabetes mellitus    Expect blood  sugars may go up temporarily while on prednisone        Musculoskeletal and Integument   Contact dermatitis - Primary    not typical; will use round of prednisone; if not clearing up or if new symptoms or places arise, refer to derm         Follow up plan: Return if symptoms worsen or fail to improve.  An after-visit summary was printed and given to the patient at [place].  Please see the patient instructions which may contain other information and recommendations beyond what is mentioned above in the assessment and plan. Meds ordered this encounter  Medications  . predniSONE (STERAPRED UNI-PAK 21 TAB) 10 MG (21) TBPK tablet    Sig: Take as directed; 6 day taper; take with food    Dispense:  21 tablet    Refill:  0

## 2015-05-30 NOTE — Patient Instructions (Signed)
Start the prednisone, take with food Do NOT take the meloxicam for one week If you notice abdominal pain or dark stools, seek medical help immediately Use the anti-histamine if needed for itching Okay to use Sarna or over-the-counter cortisone for the individual spots to help itching If new spots develop, or if you develop joint pains or other symptoms, then contact us

## 2015-05-30 NOTE — Assessment & Plan Note (Signed)
not typical; will use round of prednisone; if not clearing up or if new symptoms or places arise, refer to derm

## 2015-05-30 NOTE — Assessment & Plan Note (Signed)
Expect blood sugars may go up temporarily while on prednisone

## 2015-05-31 ENCOUNTER — Ambulatory Visit: Payer: TRICARE For Life (TFL) | Admitting: Family Medicine

## 2015-06-03 ENCOUNTER — Other Ambulatory Visit: Payer: Self-pay | Admitting: Family Medicine

## 2015-06-11 ENCOUNTER — Ambulatory Visit
Admission: RE | Admit: 2015-06-11 | Discharge: 2015-06-11 | Disposition: A | Discharge status: Home / Self Care | Source: Ambulatory Visit | Attending: Radiation Oncology | Admitting: Radiation Oncology

## 2015-06-11 ENCOUNTER — Encounter: Payer: Self-pay | Admitting: Radiation Oncology

## 2015-06-11 VITALS — BP 131/79 | HR 64 | Temp 97.1°F | Resp 18 | Ht 70.5 in | Wt 195.2 lb

## 2015-06-11 DIAGNOSIS — C61 Malignant neoplasm of prostate: Secondary | ICD-10-CM

## 2015-06-11 MED ORDER — VARDENAFIL HCL 20 MG PO TABS: ORAL_TABLET | ORAL | Status: DC

## 2015-06-11 MED ORDER — TAMSULOSIN HCL 0.4 MG PO CAPS: 0.4000 mg | ORAL_CAPSULE | Freq: Every day | ORAL | Status: DC

## 2015-06-11 NOTE — Progress Notes (Signed)
Radiation Oncology Follow up Note  Name: Troy Christensen   Date:   06/11/2015 MRN:  [id number] DOB: 1951-03-28    This 64 y.o. male presents to the clinic today for follow-up for prostate cancer  REFERRING PROVIDER: Guadalupe Maple, MD  HPI: Patient is a 63 year old male presented with stage IIB (T1 CN 0M00 Gleason 8 (4+4) adenocarcinoma the prostate with a PSA of 6.3. Underwent IM RT radiation therapy image guided to both his prostate and pelvic nodes. He is now seen out 1 month and is doing well. Does have some erectile dysfunction. Also having some nocturia 3-4 and some urgency and frequency during the day. No diarrhea at this time..  COMPLICATIONS OF TREATMENT: none  FOLLOW UP COMPLIANCE: keeps appointments   PHYSICAL EXAM:  BP 131/79 mmHg  Pulse 64  Temp(Src) 97.1 F (36.2 C)  Resp 18  Ht 5' 10.5" (1.791 m)  Wt 195 lb 3.5 oz (88.55 kg)  BMI 27.61 kg/m2 On rectal exam rectal sphincter tone is good. Prostate is smooth contracted without evidence of nodularity or mass. Sulcus is preserved bilaterally. No discrete nodularity is identified. No other rectal abnormalities are noted. Well-developed well-nourished patient in NAD. HEENT reveals PERLA, EOMI, discs not visualized.  Oral cavity is clear. No oral mucosal lesions are identified. Neck is clear without evidence of cervical or supraclavicular adenopathy. Lungs are clear to A&P. Cardiac examination is essentially unremarkable with regular rate and rhythm without murmur rub or thrill. Abdomen is benign with no organomegaly or masses noted. Motor sensory and DTR levels are equal and symmetric in the upper and lower extremities. Cranial nerves II through XII are grossly intact. Proprioception is intact. No peripheral adenopathy or edema is identified. No motor or sensory levels are noted. Crude visual fields are within normal range.   RADIOLOGY RESULTS: No current films for review  PLAN: At this time I'm starting him on some  Levitra 20 mg half a tab as directed for erectile dysfunction. I am also starting on Flomax a half an hour after dinner every night. I've explained to him our PSA protocol would like to wait another 3-4 months and perform a PSA at that time. I've asked to see him in follow-up at that time. Patient is to call sooner with any concerns.  I would like to take this opportunity for allowing me to participate in the care of your patient.Armstead Peaks., MD

## 2015-06-12 ENCOUNTER — Other Ambulatory Visit: Payer: Self-pay | Admitting: *Deleted

## 2015-06-12 MED ORDER — SILDENAFIL CITRATE 20 MG PO TABS: 20.0000 mg | ORAL_TABLET | Freq: Every day | ORAL | Status: DC | PRN

## 2015-06-21 ENCOUNTER — Other Ambulatory Visit: Payer: Self-pay | Admitting: *Deleted

## 2015-06-21 DIAGNOSIS — C61 Malignant neoplasm of prostate: Secondary | ICD-10-CM

## 2015-06-26 ENCOUNTER — Inpatient Hospital Stay: Attending: Radiation Oncology

## 2015-06-26 ENCOUNTER — Other Ambulatory Visit: Payer: Self-pay | Admitting: *Deleted

## 2015-06-26 ENCOUNTER — Ambulatory Visit (INDEPENDENT_AMBULATORY_CARE_PROVIDER_SITE_OTHER): Payer: TRICARE For Life (TFL) | Admitting: Family Medicine

## 2015-06-26 ENCOUNTER — Encounter: Payer: Self-pay | Admitting: Family Medicine

## 2015-06-26 VITALS — BP 138/82 | HR 72 | Temp 96.6°F

## 2015-06-26 VITALS — BP 131/76 | HR 73 | Temp 98.0°F | Ht 69.2 in | Wt 198.0 lb

## 2015-06-26 DIAGNOSIS — C61 Malignant neoplasm of prostate: Secondary | ICD-10-CM | POA: Insufficient documentation

## 2015-06-26 DIAGNOSIS — E785 Hyperlipidemia, unspecified: Secondary | ICD-10-CM | POA: Diagnosis not present

## 2015-06-26 DIAGNOSIS — E119 Type 2 diabetes mellitus without complications: Secondary | ICD-10-CM | POA: Diagnosis not present

## 2015-06-26 DIAGNOSIS — Z79818 Long term (current) use of other agents affecting estrogen receptors and estrogen levels: Secondary | ICD-10-CM | POA: Diagnosis not present

## 2015-06-26 DIAGNOSIS — Z23 Encounter for immunization: Secondary | ICD-10-CM

## 2015-06-26 DIAGNOSIS — I10 Essential (primary) hypertension: Secondary | ICD-10-CM | POA: Diagnosis not present

## 2015-06-26 LAB — BAYER DCA HB A1C WAIVED: HB A1C: 7.4 % — AB (ref <7.0)

## 2015-06-26 MED ORDER — LEUPROLIDE ACETATE (4 MONTH) 30 MG IM KIT: 30.0000 mg | PACK | Freq: Once | INTRAMUSCULAR | Status: DC

## 2015-06-26 MED ORDER — LEUPROLIDE ACETATE (4 MONTH) 30 MG IM KIT
30.0000 mg | PACK | Freq: Once | INTRAMUSCULAR | Status: AC
  Administered 2015-06-26: 30 mg via INTRAMUSCULAR
  Filled 2015-06-26: qty 30

## 2015-06-26 NOTE — Assessment & Plan Note (Signed)
The current medical regimen is effective;  continue present plan and medications.  

## 2015-06-26 NOTE — Assessment & Plan Note (Signed)
Discussed need for better control patient will be starting more exercise and activity with better diet.

## 2015-06-26 NOTE — Progress Notes (Signed)
BP 131/76 mmHg  Pulse 73  Temp(Src) 98 F (36.7 C)  Ht 5' 9.2" (1.758 m)  Wt 198 lb (89.812 kg)  BMI 29.06 kg/m2  SpO2 96%   Subjective:    Patient ID: Troy Christensen, male    DOB: 06/21/1951, 64 y.o.   MRN: [id number]  HPI: Troy Christensen is a 64 y.o. male  Chief Complaint  Patient presents with  . Diabetes   patient all in all doing well noted low blood sugar spells. Has been through radiation and completed. Through that just fine with some fatigue. Still some dysuria. Blood pressure doing well no side effects from medication No gout or gout symptoms Lipids doing well on lovastatin.  Taking medicines faithfully with no side effects.    Relevant past medical, surgical, family and social history reviewed and updated as indicated. Interim medical history since our last visit reviewed. Allergies and medications reviewed and updated.  Review of Systems  Constitutional: Negative.   Respiratory: Negative.   Cardiovascular: Negative.     Per HPI unless specifically indicated above     Objective:    BP 131/76 mmHg  Pulse 73  Temp(Src) 98 F (36.7 C)  Ht 5' 9.2" (1.758 m)  Wt 198 lb (89.812 kg)  BMI 29.06 kg/m2  SpO2 96%  Wt Readings from Last 3 Encounters:  06/26/15 198 lb (89.812 kg)  06/11/15 195 lb 3.5 oz (88.55 kg)  05/30/15 192 lb (87.091 kg)    Physical Exam  Constitutional: He is oriented to person, place, and time. He appears well-developed and well-nourished. No distress.  HENT:  Head: Normocephalic and atraumatic.  Right Ear: Hearing normal.  Left Ear: Hearing normal.  Nose: Nose normal.  Eyes: Conjunctivae and lids are normal. Right eye exhibits no discharge. Left eye exhibits no discharge. No scleral icterus.  Pulmonary/Chest: Effort normal. No respiratory distress.  Musculoskeletal: Normal range of motion.  Neurological: He is alert and oriented to person, place, and time.  Skin: Skin is intact. No rash noted.  Psychiatric: He has a  normal mood and affect. His speech is normal and behavior is normal. Judgment and thought content normal. Cognition and memory are normal.    Results for orders placed or performed in visit on 04/18/15  CBC  Result Value Ref Range   WBC 3.6 (L) 3.8 - 10.6 K/uL   RBC 3.96 (L) 4.40 - 5.90 MIL/uL   Hemoglobin 12.3 (L) 13.0 - 18.0 g/dL   HCT 35.5 (L) 40.0 - 52.0 %   MCV 89.6 80.0 - 100.0 fL   MCH 31.0 26.0 - 34.0 pg   MCHC 34.6 32.0 - 36.0 g/dL   RDW 16.4 (H) 11.5 - 14.5 %   Platelets 164 150 - 440 K/uL      Assessment & Plan:   Problem List Items Addressed This Visit      Cardiovascular and Mediastinum   Hypertension    The current medical regimen is effective;  continue present plan and medications.         Endocrine   Diabetes mellitus ([place])    Discussed need for better control patient will be starting more exercise and activity with better diet.        Other   Hyperlipidemia    The current medical regimen is effective;  continue present plan and medications.        Other Visit Diagnoses    Diabetes mellitus without complication ([place])    -  Primary  Relevant Orders    Bayer DCA Hb A1c Waived    Immunization due        Relevant Orders    Flu Vaccine QUAD 36+ mos PF IM (Fluarix & Fluzone Quad PF) (Completed)        Follow up plan: Return in about 3 months (around 09/26/2015) for Physical Exam and a1c.

## 2015-07-31 ENCOUNTER — Other Ambulatory Visit: Payer: Self-pay | Admitting: Family Medicine

## 2015-09-01 ENCOUNTER — Other Ambulatory Visit: Payer: Self-pay | Admitting: Family Medicine

## 2015-10-11 ENCOUNTER — Encounter: Payer: Self-pay | Admitting: Family Medicine

## 2015-10-11 ENCOUNTER — Ambulatory Visit (INDEPENDENT_AMBULATORY_CARE_PROVIDER_SITE_OTHER): Payer: TRICARE For Life (TFL) | Admitting: Family Medicine

## 2015-10-11 VITALS — BP 124/74 | HR 71 | Temp 98.4°F | Wt 203.0 lb

## 2015-10-11 DIAGNOSIS — J069 Acute upper respiratory infection, unspecified: Secondary | ICD-10-CM | POA: Diagnosis not present

## 2015-10-11 DIAGNOSIS — J029 Acute pharyngitis, unspecified: Secondary | ICD-10-CM | POA: Diagnosis not present

## 2015-10-11 DIAGNOSIS — Z8546 Personal history of malignant neoplasm of prostate: Secondary | ICD-10-CM | POA: Diagnosis not present

## 2015-10-11 NOTE — Progress Notes (Signed)
BP 124/74 mmHg  Pulse 71  Temp(Src) 98.4 F (36.9 C)  Wt 203 lb (92.08 kg)  SpO2 98%   Subjective:    Patient ID: Troy Christensen, male    DOB: Jul 09, 1951, 65 y.o.   MRN: [id number]  HPI: Troy Christensen is a 65 y.o. male  Chief Complaint  Patient presents with  . Sore Throat    x 2 days, little head congestion, some cough. No fever, no headache, no ear ache.   Woke up yesterday with sore throat; no known sick contacts with similar symptoms Has a cough some, but not bad He has been out in the cold weather some, does security for ball games No swollen glands in the neck No headaches No body aches like flu Felt a little warm, no fever Little head congestion  Relevant past medical, social history reviewed and updated as indicated Past Medical History  Diagnosis Date  . Cancer [facility])     Prostate  . Prostate cancer ([place])   . Glaucoma   . Hypertension   . Hypercholesteremia   . Gout   . Osteoarthritis    Social History  Substance Use Topics  . Smoking status: Former Smoker    Types: Cigarettes    Quit date: 03/26/1975  . Smokeless tobacco: Never Used  . Alcohol Use: No   Interim medical history since our last visit reviewed. Allergies and medications reviewed and updated.  Review of Systems  Per HPI unless specifically indicated above     Objective:    BP 124/74 mmHg  Pulse 71  Temp(Src) 98.4 F (36.9 C)  Wt 203 lb (92.08 kg)  SpO2 98%  Wt Readings from Last 3 Encounters:  10/11/15 203 lb (92.08 kg)  06/26/15 198 lb (89.812 kg)  06/11/15 195 lb 3.5 oz (88.55 kg)    Physical Exam  Constitutional: He appears well-developed and well-nourished. No distress.  HENT:  Right Ear: External ear and ear canal normal.  Left Ear: External ear and ear canal normal.  Nose: No rhinorrhea.  Mouth/Throat: Mucous membranes are normal. Posterior oropharyngeal erythema (minimal injection posteriorly) present. No oropharyngeal exudate.  Eyes: EOM are normal. Right  eye exhibits no discharge. Left eye exhibits no discharge. No scleral icterus.  Cardiovascular: Normal rate and regular rhythm.   Pulmonary/Chest: Effort normal and breath sounds normal. He has no decreased breath sounds. He has no wheezes. He has no rhonchi.  Lymphadenopathy:    He has no cervical adenopathy.  Skin: No rash noted. He is not diaphoretic. No pallor.   Rapid in-house Group A strep:  Negative; discussed with patient and his wife    Assessment & Plan:   Problem List Items Addressed This Visit      Other   History of prostate cancer    Other Visit Diagnoses    Sore throat    -  Primary    rapid strep collected, negative; culture pending; see AVS; symptomatic care; explained antibiotic is not indicated for this    Relevant Orders    Rapid Strep Screen (Completed)    Upper respiratory infection        rest, hydration, vit C, green tea; antibiotics not needed for viral illnesses       Follow up plan: Return if symptoms worsen or fail to improve.  An after-visit summary was printed and given to the patient at [place].  Please see the patient instructions which may contain other information and recommendations beyond what is mentioned above in  the assessment and plan.

## 2015-10-11 NOTE — Patient Instructions (Addendum)
Try vitamin C (orange juice if not diabetic or vitamin C tablets) and drink green tea to help your immune system during your illness Get plenty of rest and hydrationSore Throat A sore throat is pain, burning, irritation, or scratchiness of the throat. There is often pain or tenderness when swallowing or talking. A sore throat may be accompanied by other symptoms, such as coughing, sneezing, fever, and swollen neck glands. A sore throat is often the first sign of another sickness, such as a cold, flu, strep throat, or mononucleosis (commonly known as mono). Most sore throats go away without medical treatment. CAUSES  The most common causes of a sore throat include:  A viral infection, such as a cold, flu, or mono.  A bacterial infection, such as strep throat, tonsillitis, or whooping cough.  Seasonal allergies.  Dryness in the air.  Irritants, such as smoke or pollution.  Gastroesophageal reflux disease (GERD). HOME CARE INSTRUCTIONS   Only take over-the-counter medicines as directed by your caregiver.  Drink enough fluids to keep your urine clear or pale yellow.  Rest as needed.  Try using throat sprays, lozenges, or sucking on hard candy to ease any pain (if older than 4 years or as directed).  Sip warm liquids, such as broth, herbal tea, or warm water with honey to relieve pain temporarily. You may also eat or drink cold or frozen liquids such as frozen ice pops.  Gargle with salt water (mix 1 tsp salt with 8 oz of water).  Do not smoke and avoid secondhand smoke.  Put a cool-mist humidifier in your bedroom at night to moisten the air. You can also turn on a hot shower and sit in the bathroom with the door closed for 5-10 minutes. SEEK IMMEDIATE MEDICAL CARE IF:  You have difficulty breathing.  You are unable to swallow fluids, soft foods, or your saliva.  You have increased swelling in the throat.  Your sore throat does not get better in 7 days.  You have nausea and  vomiting.  You have a fever or persistent symptoms for more than 2-3 days.  You have a fever and your symptoms suddenly get worse. MAKE SURE YOU:   Understand these instructions.  Will watch your condition.  Will get help right away if you are not doing well or get worse.   This information is not intended to replace advice given to you by your health care provider. Make sure you discuss any questions you have with your health care provider.   Document Released: 10/09/2004 Document Revised: 09/22/2014 Document Reviewed: 05/09/2012 Elsevier Interactive Patient Education 2016 [employer]  

## 2015-10-13 DIAGNOSIS — Z8546 Personal history of malignant neoplasm of prostate: Secondary | ICD-10-CM | POA: Insufficient documentation

## 2015-10-13 LAB — RAPID STREP SCREEN (MED CTR MEBANE ONLY): Strep Gp A Ag, IA W/Reflex: NEGATIVE

## 2015-10-13 LAB — CULTURE, GROUP A STREP: Strep A Culture: NEGATIVE

## 2015-10-18 ENCOUNTER — Other Ambulatory Visit: Payer: Self-pay | Admitting: *Deleted

## 2015-10-18 DIAGNOSIS — C61 Malignant neoplasm of prostate: Secondary | ICD-10-CM

## 2015-10-23 ENCOUNTER — Other Ambulatory Visit: Payer: Self-pay | Admitting: *Deleted

## 2015-10-24 ENCOUNTER — Inpatient Hospital Stay: Attending: Radiation Oncology

## 2015-10-24 ENCOUNTER — Ambulatory Visit
Admission: RE | Admit: 2015-10-24 | Discharge: 2015-10-24 | Disposition: A | Discharge status: Home / Self Care | Source: Ambulatory Visit | Attending: Radiation Oncology | Admitting: Radiation Oncology

## 2015-10-24 ENCOUNTER — Encounter: Payer: Self-pay | Admitting: Radiation Oncology

## 2015-10-24 VITALS — BP 148/86 | HR 71 | Wt 205.2 lb

## 2015-10-24 DIAGNOSIS — C61 Malignant neoplasm of prostate: Secondary | ICD-10-CM

## 2015-10-24 LAB — PSA: PSA: 0.13 ng/mL (ref 0.00–4.00)

## 2015-10-24 NOTE — Progress Notes (Signed)
Radiation Oncology Follow up Note  Name: Troy Christensen   Date:   10/24/2015 MRN:  [id number] DOB: [birth year]    This 65 y.o. male presents to the clinic today for follow-up for prostate cancer 5 months out stage IIb (T1 cN0 M0).  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 He received both prostate and pelvic node radiation. He has been on Lupron for the past 6 months. He's been having some irregular bowel functions no specific diarrhea sounds more like irritable bowel syndrome with defecation shortly after eating. He says this is intermittent. He is having no lower urinary tract symptoms nocturia 2. He also complains of erectile dysfunction has been on generic Viagra is taking that intermittently.. Patient also complains of intermittent blood on the tissue paper after wiping and does take daily aspirin.  COMPLICATIONS OF TREATMENT: none  FOLLOW UP COMPLIANCE: keeps appointments   PHYSICAL EXAM:  BP 148/86 mmHg  Pulse 71  Wt 205 lb 4 oz (93.1 kg) On rectal exam rectal sphincter tone is good. Prostate is smooth contracted without evidence of nodularity or mass. Sulcus is preserved bilaterally. No discrete nodularity is identified. No other rectal abnormalities are noted. Well-developed well-nourished patient in NAD. HEENT reveals PERLA, EOMI, discs not visualized.  Oral cavity is clear. No oral mucosal lesions are identified. Neck is clear without evidence of cervical or supraclavicular adenopathy. Lungs are clear to A&P. Cardiac examination is essentially unremarkable with regular rate and rhythm without murmur rub or thrill. Abdomen is benign with no organomegaly or masses noted. Motor sensory and DTR levels are equal and symmetric in the upper and lower extremities. Cranial nerves II through XII are grossly intact.  Proprioception is intact. No peripheral adenopathy or edema is identified. No motor or sensory levels are noted. Crude visual fields are within normal range.  RADIOLOGY RESULTS: No current films for review  PLAN: At this time I asked him to follow a low residue diet. I've also asked him to stop his daily aspirin for a while since he is probably aggravating some hemorrhoidal tissue. I've also asked him to start taking the generic Viagra about 2-3 times a week whether he anticipates having erection or not. I have run a PSA level on him today and report that separately. Depending on his PSA may continue on Lupron therapy for another year and I have discussed that with the patient. I've asked to see him back in 6 months for follow-up.  I would like to take this opportunity for allowing me to participate in the care of your patient.Armstead Peaks., MD

## 2015-11-20 ENCOUNTER — Telehealth: Payer: Self-pay

## 2015-11-20 ENCOUNTER — Encounter: Payer: Self-pay | Admitting: Family Medicine

## 2015-11-20 ENCOUNTER — Ambulatory Visit (INDEPENDENT_AMBULATORY_CARE_PROVIDER_SITE_OTHER): Payer: TRICARE For Life (TFL) | Admitting: Family Medicine

## 2015-11-20 VITALS — BP 138/72 | HR 59 | Temp 97.4°F | Ht 69.6 in | Wt 202.0 lb

## 2015-11-20 DIAGNOSIS — E119 Type 2 diabetes mellitus without complications: Secondary | ICD-10-CM

## 2015-11-20 DIAGNOSIS — H409 Unspecified glaucoma: Secondary | ICD-10-CM | POA: Insufficient documentation

## 2015-11-20 DIAGNOSIS — M109 Gout, unspecified: Secondary | ICD-10-CM | POA: Insufficient documentation

## 2015-11-20 DIAGNOSIS — I10 Essential (primary) hypertension: Secondary | ICD-10-CM | POA: Diagnosis not present

## 2015-11-20 DIAGNOSIS — E785 Hyperlipidemia, unspecified: Secondary | ICD-10-CM

## 2015-11-20 DIAGNOSIS — T560X1D Toxic effect of lead and its compounds, accidental (unintentional), subsequent encounter: Secondary | ICD-10-CM

## 2015-11-20 DIAGNOSIS — K627 Radiation proctitis: Secondary | ICD-10-CM

## 2015-11-20 DIAGNOSIS — Z0000 Encounter for general adult medical examination without abnormal findings: Secondary | ICD-10-CM

## 2015-11-20 DIAGNOSIS — M1A1790 Lead-induced chronic gout, unspecified ankle and foot, without tophus (tophi): Secondary | ICD-10-CM

## 2015-11-20 LAB — URINALYSIS, ROUTINE W REFLEX MICROSCOPIC
Bilirubin, UA: NEGATIVE
GLUCOSE, UA: NEGATIVE
Ketones, UA: NEGATIVE
LEUKOCYTES UA: NEGATIVE
Nitrite, UA: NEGATIVE
PH UA: 5 (ref 5.0–7.5)
PROTEIN UA: NEGATIVE
Specific Gravity, UA: 1.025 (ref 1.005–1.030)
UUROB: 0.2 mg/dL (ref 0.2–1.0)

## 2015-11-20 LAB — LIPID PANEL PICCOLO, WAIVED
Chol/HDL Ratio Piccolo,Waive: 3.3 mg/dL
Cholesterol Piccolo, Waived: 171 mg/dL (ref <200)
HDL CHOL PICCOLO, WAIVED: 52 mg/dL — AB (ref >59)
LDL CHOL CALC PICCOLO WAIVED: 76 mg/dL (ref <100)
TRIGLYCERIDES PICCOLO,WAIVED: 213 mg/dL — AB (ref <150)
VLDL CHOL CALC PICCOLO,WAIVE: 43 mg/dL — AB (ref <30)

## 2015-11-20 LAB — MICROSCOPIC EXAMINATION
Epithelial Cells (non renal): NONE SEEN /hpf (ref 0–10)
WBC UA: NONE SEEN /HPF (ref 0–?)

## 2015-11-20 LAB — BAYER DCA HB A1C WAIVED: HB A1C: 7.6 % — AB (ref <7.0)

## 2015-11-20 MED ORDER — METFORMIN HCL 500 MG PO TABS: 1000.0000 mg | ORAL_TABLET | Freq: Two times a day (BID) | ORAL | Status: DC

## 2015-11-20 MED ORDER — LOVASTATIN 40 MG PO TABS: 40.0000 mg | ORAL_TABLET | Freq: Every day | ORAL | Status: DC

## 2015-11-20 MED ORDER — PIOGLITAZONE HCL 45 MG PO TABS: 45.0000 mg | ORAL_TABLET | Freq: Every day | ORAL | Status: DC

## 2015-11-20 MED ORDER — AMLODIPINE BESY-BENAZEPRIL HCL 5-20 MG PO CAPS: ORAL_CAPSULE | ORAL | Status: DC

## 2015-11-20 MED ORDER — ALLOPURINOL 100 MG PO TABS: 100.0000 mg | ORAL_TABLET | Freq: Every day | ORAL | Status: DC

## 2015-11-20 MED ORDER — MELOXICAM 15 MG PO TABS: 15.0000 mg | ORAL_TABLET | Freq: Every day | ORAL | Status: DC

## 2015-11-20 MED ORDER — PRAMOXINE HCL 1 % RE FOAM: 1.0000 "application " | Freq: Three times a day (TID) | RECTAL | Status: DC | PRN

## 2015-11-20 MED ORDER — HYDROCORTISONE ACETATE 25 MG RE SUPP: 25.0000 mg | Freq: Two times a day (BID) | RECTAL | Status: DC

## 2015-11-20 NOTE — Assessment & Plan Note (Addendum)
Discuss poor control of diabetes need for better diet offered new medications patient not willing yet wants to do diet for the next 3 months

## 2015-11-20 NOTE — Assessment & Plan Note (Signed)
The current medical regimen is effective;  continue present plan and medications.  

## 2015-11-20 NOTE — Progress Notes (Signed)
BP 138/72 mmHg  Pulse 59  Temp(Src) 97.4 F (36.3 C)  Ht 5' 9.6" (1.768 m)  Wt 202 lb (91.627 kg)  BMI 29.31 kg/m2  SpO2 98%   Subjective:    Patient ID: Troy Christensen, male    DOB: 11/10/1950, 65 y.o.   MRN: [id number]  HPI: Troy Christensen is a 65 y.o. male  Chief Complaint  Patient presents with  . Annual Exam   Patient over the last 1-2 months bright red rectal bleeding painless comes after bowel movement with some straining of red on the toilet paper then no further episodes no with no blood in underwear otherwise been doing well stopped his aspirin has not tried any other over-the-counter medications. Getting good reports from urology and radiation therapy with PSA 0.13 Last radiation was in August No gout symptoms But pressures been doing well no complaints from medications   no issues with diabetes low blood sugar spells or side effects from any medications no ankle swelling etc.   Relevant past medical, surgical, family and social history reviewed and updated as indicated. Interim medical history since our last visit reviewed. Allergies and medications reviewed and updated.  Review of Systems  Constitutional: Negative.   HENT: Negative.   Eyes: Negative.   Respiratory: Negative.   Cardiovascular: Negative.   Gastrointestinal: Negative.   Endocrine: Negative.   Genitourinary: Negative.   Musculoskeletal: Negative.   Skin: Negative.   Allergic/Immunologic: Negative.   Neurological: Negative.   Hematological: Negative.   Psychiatric/Behavioral: Negative.     Per HPI unless specifically indicated above     Objective:    BP 138/72 mmHg  Pulse 59  Temp(Src) 97.4 F (36.3 C)  Ht 5' 9.6" (1.768 m)  Wt 202 lb (91.627 kg)  BMI 29.31 kg/m2  SpO2 98%  Wt Readings from Last 3 Encounters:  11/20/15 202 lb (91.627 kg)  10/24/15 205 lb 4 oz (93.1 kg)  10/11/15 203 lb (92.08 kg)    Physical Exam  Constitutional: He is oriented to person, place, and  time. He appears well-developed and well-nourished.  HENT:  Head: Normocephalic.  Right Ear: External ear normal.  Left Ear: External ear normal.  Nose: Nose normal.  Eyes: Conjunctivae and EOM are normal. Pupils are equal, round, and reactive to light.  Neck: Normal range of motion. Neck supple. No thyromegaly present.  Cardiovascular: Normal rate, regular rhythm, normal heart sounds and intact distal pulses.   Pulmonary/Chest: Effort normal and breath sounds normal.  Abdominal: Soft. Bowel sounds are normal. There is no splenomegaly or hepatomegaly.  Genitourinary: Penis normal.  Prostate checkecd by Chrystal  Musculoskeletal: Normal range of motion.  Lymphadenopathy:    He has no cervical adenopathy.  Neurological: He is alert and oriented to person, place, and time. He has normal reflexes.  Skin: Skin is warm and dry.  Psychiatric: He has a normal mood and affect. His behavior is normal. Judgment and thought content normal.    Results for orders placed or performed in visit on 10/24/15  PSA  Result Value Ref Range   PSA 0.13 0.00 - 4.00 ng/mL      Assessment & Plan:   Problem List Items Addressed This Visit      Cardiovascular and Mediastinum   Hypertension    The current medical regimen is effective;  continue present plan and medications.       Relevant Medications   lovastatin (MEVACOR) 40 MG tablet   amLODipine-benazepril (LOTREL) 5-20 MG capsule  Digestive   Radiation proctitis - Primary    Discussed use of proctofoam PRN        Endocrine   Diabetes mellitus ([place])    Discuss poor control of diabetes need for better diet offered new medications patient not willing yet wants to do diet for the next 3 months      Relevant Medications   pioglitazone (ACTOS) 45 MG tablet   metFORMIN (GLUCOPHAGE) 500 MG tablet   lovastatin (MEVACOR) 40 MG tablet   amLODipine-benazepril (LOTREL) 5-20 MG capsule     Other   Hyperlipidemia    The current medical regimen  is effective;  continue present plan and medications.       Relevant Medications   lovastatin (MEVACOR) 40 MG tablet   amLODipine-benazepril (LOTREL) 5-20 MG capsule   Gout    The current medical regimen is effective;  continue present plan and medications.       Relevant Medications   allopurinol (ZYLOPRIM) 100 MG tablet   Other Relevant Orders   Uric acid   Glaucoma    Other Visit Diagnoses    PE (physical exam), annual        Relevant Orders    Comprehensive metabolic panel    Bayer DCA Hb A1c Waived    Lipid panel    CBC with Differential/Platelet    TSH    Urinalysis, Routine w reflex microscopic (not at [facility]        Follow up plan: Return in about 3 months (around 02/20/2016) for A1c.

## 2015-11-20 NOTE — Telephone Encounter (Signed)
Vladimir Faster called patient the "cream" is not covered by his insurance, please write something else

## 2015-11-20 NOTE — Assessment & Plan Note (Signed)
Discussed use of proctofoam PRN

## 2015-11-21 LAB — TSH: TSH: 1.76 u[IU]/mL (ref 0.450–4.500)

## 2015-11-21 LAB — COMPREHENSIVE METABOLIC PANEL
A/G RATIO: 2.1 (ref 1.1–2.5)
ALBUMIN: 4.4 g/dL (ref 3.6–4.8)
ALT: 22 IU/L (ref 0–44)
AST: 16 IU/L (ref 0–40)
Alkaline Phosphatase: 59 IU/L (ref 39–117)
BUN / CREAT RATIO: 17 (ref 10–22)
BUN: 16 mg/dL (ref 8–27)
CALCIUM: 9.7 mg/dL (ref 8.6–10.2)
CO2: 23 mmol/L (ref 18–29)
Chloride: 102 mmol/L (ref 96–106)
Creatinine, Ser: 0.96 mg/dL (ref 0.76–1.27)
GFR, EST AFRICAN AMERICAN: 96 mL/min/{1.73_m2} (ref >59)
GFR, EST NON AFRICAN AMERICAN: 83 mL/min/{1.73_m2} (ref >59)
Globulin, Total: 2.1 g/dL (ref 1.5–4.5)
Glucose: 139 mg/dL — ABNORMAL HIGH (ref 65–99)
POTASSIUM: 4.6 mmol/L (ref 3.5–5.2)
Sodium: 141 mmol/L (ref 134–144)
TOTAL PROTEIN: 6.5 g/dL (ref 6.0–8.5)

## 2015-11-21 LAB — CBC WITH DIFFERENTIAL/PLATELET
BASOS: 1 %
Basophils Absolute: 0 10*3/uL (ref 0.0–0.2)
EOS (ABSOLUTE): 0.3 10*3/uL (ref 0.0–0.4)
Eos: 8 %
HEMOGLOBIN: 12.3 g/dL — AB (ref 12.6–17.7)
Hematocrit: 36.2 % — ABNORMAL LOW (ref 37.5–51.0)
IMMATURE GRANS (ABS): 0 10*3/uL (ref 0.0–0.1)
Immature Granulocytes: 0 %
LYMPHS ABS: 0.9 10*3/uL (ref 0.7–3.1)
LYMPHS: 28 %
MCH: 32.4 pg (ref 26.6–33.0)
MCHC: 34 g/dL (ref 31.5–35.7)
MCV: 95 fL (ref 79–97)
MONOCYTES: 7 %
Monocytes Absolute: 0.2 10*3/uL (ref 0.1–0.9)
NEUTROS ABS: 1.8 10*3/uL (ref 1.4–7.0)
Neutrophils: 56 %
Platelets: 204 10*3/uL (ref 150–379)
RBC: 3.8 x10E6/uL — ABNORMAL LOW (ref 4.14–5.80)
RDW: 14.2 % (ref 12.3–15.4)
WBC: 3.3 10*3/uL — ABNORMAL LOW (ref 3.4–10.8)

## 2015-11-21 LAB — URIC ACID: Uric Acid: 6.4 mg/dL (ref 3.7–8.6)

## 2015-11-22 ENCOUNTER — Other Ambulatory Visit: Payer: Self-pay | Admitting: Family Medicine

## 2015-11-22 ENCOUNTER — Telehealth: Payer: Self-pay | Admitting: Family Medicine

## 2015-11-22 MED ORDER — METFORMIN HCL ER 500 MG PO TB24: 500.0000 mg | ORAL_TABLET | Freq: Every day | ORAL | Status: DC

## 2015-11-22 NOTE — Telephone Encounter (Signed)
-----   Message from Wynn Maudlin, [place] sent at 11/22/2015 12:00 PM EST ----- labs

## 2015-11-22 NOTE — Telephone Encounter (Signed)
On call Discussed with patient anemia is stable patient is aware has been worked up and followed by GI.

## 2015-12-03 ENCOUNTER — Other Ambulatory Visit: Payer: Self-pay | Admitting: Family Medicine

## 2015-12-03 MED ORDER — METFORMIN HCL ER 500 MG PO TB24: 1000.0000 mg | ORAL_TABLET | Freq: Two times a day (BID) | ORAL | Status: DC

## 2015-12-13 LAB — HM DIABETES EYE EXAM

## 2015-12-17 ENCOUNTER — Ambulatory Visit: Payer: TRICARE For Life (TFL) | Admitting: Family Medicine

## 2015-12-17 ENCOUNTER — Emergency Department

## 2015-12-17 DIAGNOSIS — R05 Cough: Secondary | ICD-10-CM | POA: Diagnosis not present

## 2015-12-17 DIAGNOSIS — I10 Essential (primary) hypertension: Secondary | ICD-10-CM | POA: Diagnosis not present

## 2015-12-17 DIAGNOSIS — Z87891 Personal history of nicotine dependence: Secondary | ICD-10-CM | POA: Insufficient documentation

## 2015-12-17 DIAGNOSIS — E7800 Pure hypercholesterolemia, unspecified: Secondary | ICD-10-CM | POA: Diagnosis not present

## 2015-12-17 DIAGNOSIS — R112 Nausea with vomiting, unspecified: Secondary | ICD-10-CM | POA: Diagnosis not present

## 2015-12-17 DIAGNOSIS — Z7984 Long term (current) use of oral hypoglycemic drugs: Secondary | ICD-10-CM | POA: Insufficient documentation

## 2015-12-17 DIAGNOSIS — Z79899 Other long term (current) drug therapy: Secondary | ICD-10-CM | POA: Insufficient documentation

## 2015-12-17 DIAGNOSIS — Z888 Allergy status to other drugs, medicaments and biological substances status: Secondary | ICD-10-CM | POA: Diagnosis not present

## 2015-12-17 DIAGNOSIS — Z7982 Long term (current) use of aspirin: Secondary | ICD-10-CM | POA: Insufficient documentation

## 2015-12-17 DIAGNOSIS — Z96651 Presence of right artificial knee joint: Secondary | ICD-10-CM | POA: Diagnosis not present

## 2015-12-17 DIAGNOSIS — Z8546 Personal history of malignant neoplasm of prostate: Secondary | ICD-10-CM | POA: Insufficient documentation

## 2015-12-17 DIAGNOSIS — M1990 Unspecified osteoarthritis, unspecified site: Secondary | ICD-10-CM | POA: Insufficient documentation

## 2015-12-17 DIAGNOSIS — E119 Type 2 diabetes mellitus without complications: Secondary | ICD-10-CM | POA: Insufficient documentation

## 2015-12-17 LAB — CBC
HCT: 37.4 % — ABNORMAL LOW (ref 40.0–52.0)
Hemoglobin: 13 g/dL (ref 13.0–18.0)
MCH: 32.4 pg (ref 26.0–34.0)
MCHC: 34.7 g/dL (ref 32.0–36.0)
MCV: 93.3 fL (ref 80.0–100.0)
PLATELETS: 155 10*3/uL (ref 150–440)
RBC: 4.01 MIL/uL — ABNORMAL LOW (ref 4.40–5.90)
RDW: 14.1 % (ref 11.5–14.5)
WBC: 4.9 10*3/uL (ref 3.8–10.6)

## 2015-12-17 LAB — COMPREHENSIVE METABOLIC PANEL
ALT: 28 U/L (ref 17–63)
AST: 29 U/L (ref 15–41)
Albumin: 4.3 g/dL (ref 3.5–5.0)
Alkaline Phosphatase: 49 U/L (ref 38–126)
Anion gap: 12 (ref 5–15)
BILIRUBIN TOTAL: 0.9 mg/dL (ref 0.3–1.2)
BUN: 15 mg/dL (ref 6–20)
CHLORIDE: 98 mmol/L — AB (ref 101–111)
CO2: 20 mmol/L — ABNORMAL LOW (ref 22–32)
CREATININE: 1.03 mg/dL (ref 0.61–1.24)
Calcium: 9.2 mg/dL (ref 8.9–10.3)
Glucose, Bld: 193 mg/dL — ABNORMAL HIGH (ref 65–99)
POTASSIUM: 3.4 mmol/L — AB (ref 3.5–5.1)
Sodium: 130 mmol/L — ABNORMAL LOW (ref 135–145)
TOTAL PROTEIN: 7.5 g/dL (ref 6.5–8.1)

## 2015-12-17 LAB — GLUCOSE, CAPILLARY
GLUCOSE-CAPILLARY: 133 mg/dL — AB (ref 65–99)
Glucose-Capillary: 189 mg/dL — ABNORMAL HIGH (ref 65–99)

## 2015-12-17 LAB — LIPASE, BLOOD: LIPASE: 23 U/L (ref 11–51)

## 2015-12-17 MED ORDER — ONDANSETRON 4 MG PO TBDP
4.0000 mg | ORAL_TABLET | Freq: Once | ORAL | Status: AC | PRN
  Administered 2015-12-17: 4 mg via ORAL
  Filled 2015-12-17: qty 1

## 2015-12-17 NOTE — ED Notes (Signed)
Pt arrives to ER via POV c/o NV X 2 days. Pt sent to ER from PCP for evaluation, pt not seen by PCP. Pt states that he has been bleeding through rectum " a little bit", hx of prostata CA. Bright red blood with BM, X 2 times. Hx of same and given suppositories to help with this per family member.

## 2015-12-18 ENCOUNTER — Emergency Department
Admission: EM | Admit: 2015-12-18 | Discharge: 2015-12-18 | Disposition: A | Discharge status: Home / Self Care | Attending: Emergency Medicine | Admitting: Emergency Medicine

## 2015-12-18 DIAGNOSIS — R112 Nausea with vomiting, unspecified: Secondary | ICD-10-CM

## 2015-12-18 MED ORDER — ONDANSETRON HCL 4 MG PO TABS: 4.0000 mg | ORAL_TABLET | Freq: Three times a day (TID) | ORAL | Status: DC | PRN

## 2015-12-18 MED ORDER — ONDANSETRON HCL 4 MG/2ML IJ SOLN
4.0000 mg | Freq: Once | INTRAMUSCULAR | Status: AC
  Administered 2015-12-18: 4 mg via INTRAVENOUS
  Filled 2015-12-18: qty 2

## 2015-12-18 MED ORDER — SODIUM CHLORIDE 0.9 % IV BOLUS (SEPSIS)
1000.0000 mL | Freq: Once | INTRAVENOUS | Status: AC
  Administered 2015-12-18: 1000 mL via INTRAVENOUS

## 2015-12-18 NOTE — ED Notes (Signed)
Pt sleeping   Family at bedside. 

## 2015-12-18 NOTE — ED Notes (Signed)
md in with pt now.  Family with pt.  Pt alert.   Pt reports he has vomiting x 2   No diarrhea.  No abd pain.  Pt watching tv.

## 2015-12-18 NOTE — ED Provider Notes (Signed)
Time Seen: Approximately *1219  I have reviewed the triage notes  Chief Complaint: Emesis and Cough   History of Present Illness: WINFERD COLGIN is a 65 y.o. male *who presents with some nausea and vomiting for the last 2 days. Patient was referred by his primary physician for evaluation. He states he's also had some small amount of rectal bleeding "" a little bit "". He's been told that he has history of hemorrhoids and he does have some suppositories a takes periodically for it. He's had a history of radiation therapy secondary to prostate cancer. He is not currently on any radiation therapy or chemotherapy. He states his main concern is the nausea and vomiting. He denies any hematemesis or biliary emesis he denies any fever or abdominal pain. He's had a mild cough which is been dry and nonproductive. He received Zofran in the triage area with symptomatic improvement Past Medical History  Diagnosis Date  . Cancer [facility])     Prostate  . Prostate cancer ([place])   . Glaucoma   . Hypertension   . Hypercholesteremia   . Gout   . Osteoarthritis     Patient Active Problem List   Diagnosis Date Noted  . 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 Glaucoma 11/20/2015  . Gout 11/20/2015  . History of prostate cancer 10/13/2015  . Osteoarthritis 03/21/2015  . 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 Diabetes mellitus ([place]) 03/21/2015  . Hypertension 03/21/2015  . Hyperlipidemia 03/21/2015    Past Surgical History  Procedure Laterality Date  . Joint replacement  2014    Right Knee Replacement  . Rotator cuff repair Right 2012  . Hernia repair    . Nephrolithiasis    . Prostate surgery    . Meniscus repair Left     Past Surgical History  Procedure Laterality Date  . Joint replacement  2014    Right Knee Replacement  . Rotator cuff repair Right 2012  . Hernia repair    . Nephrolithiasis    . Prostate surgery    . Meniscus repair Left     Current Outpatient Rx  Name   Route  Sig  Dispense  Refill  . allopurinol (ZYLOPRIM) 100 MG tablet   Oral   Take 1 tablet (100 mg total) by mouth daily.   90 tablet   4   . amLODipine-benazepril (LOTREL) 5-20 MG capsule      TAKE 2 CAPSULES ONCE EACH DAY   180 capsule   4   . aspirin 81 MG tablet   Oral   Take 81 mg by mouth daily. Reported on 11/20/2015         . glucose blood test strip   Other   1 each by Other route as needed for other. Use as instructed         . hydrocortisone (ANUSOL-HC) 25 MG suppository   Rectal   Place 1 suppository (25 mg total) rectally 2 (two) times daily.   12 suppository   2   . lovastatin (MEVACOR) 40 MG tablet   Oral   Take 1 tablet (40 mg total) by mouth daily.   90 tablet   4   . meloxicam (MOBIC) 15 MG tablet   Oral   Take 1 tablet (15 mg total) by mouth daily.   90 tablet   4   . metFORMIN (GLUCOPHAGE-XR) 500 MG 24 hr tablet   Oral   Take 2 tablets (1,000 mg total) by mouth 2 (two)  times daily.   360 tablet   4   . pioglitazone (ACTOS) 45 MG tablet   Oral   Take 1 tablet (45 mg total) by mouth daily.   90 tablet   4   . sildenafil (REVATIO) 20 MG tablet   Oral   Take 1 tablet (20 mg total) by mouth daily as needed.   10 tablet   5   . tamsulosin (FLOMAX) 0.4 MG CAPS capsule   Oral   Take 0.4 mg by mouth daily.         . timolol (TIMOPTIC) 0.5 % ophthalmic solution      1 drop 2 (two) times daily.           Allergies:  Naprosyn  Family History: Family History  Problem Relation Age of Onset  . Cancer Father 84    lung  . Cancer Brother     melanoma  . Emphysema Mother   . Dementia Mother     Social History: Social History  Substance Use Topics  . Smoking status: Former Smoker    Types: Cigarettes    Quit date: 03/26/1975  . Smokeless tobacco: Never Used  . Alcohol Use: No     Review of Systems:   10 point review of systems was performed and was otherwise negative:  Constitutional: No fever Eyes: No visual  disturbances ENT: No sore throat, ear pain Cardiac: No chest pain Respiratory: No shortness of breath, wheezing, or stridor Abdomen: No abdominal pain, nausea with decreased appetite and has been not been able to maintain some of this medication. No diarrhea Endocrine: No weight loss, No night sweats Extremities: No peripheral edema, cyanosis Skin: No rashes, easy bruising Neurologic: No focal weakness, trouble with speech or swollowing Urologic: No dysuria, Hematuria, or urinary frequency   Physical Exam:  ED Triage Vitals  Enc Vitals Group     BP 12/17/15 1438 154/84 mmHg     Pulse Rate 12/17/15 1438 94     Resp 12/17/15 1438 20     Temp 12/17/15 1438 98.7 F (37.1 C)     Temp Source 12/17/15 1438 Oral     SpO2 12/17/15 1438 96 %     Weight 12/17/15 1438 202 lb (91.627 kg)     Height --      Head Cir --      Peak Flow --      Pain Score --      Pain Loc --      Pain Edu? --      Excl. in [place]? --     General: Awake , Alert , and Oriented times 3; GCS 15 Head: Normal cephalic , atraumatic Eyes: Pupils equal , round, reactive to light Nose/Throat: No nasal drainage, patent upper airway without erythema or exudate.  Neck: Supple, Full range of motion, No anterior adenopathy or palpable thyroid masses Lungs: Clear to ascultation without wheezes , rhonchi, or rales Heart: Regular rate, regular rhythm without murmurs , gallops , or rubs Abdomen: Soft, non tender without rebound, guarding , or rigidity; bowel sounds positive and symmetric in all 4 quadrants. No organomegaly .        Extremities: 2 plus symmetric pulses. No edema, clubbing or cyanosis Neurologic: normal ambulation, Motor symmetric without deficits, sensory intact Skin: warm, dry, no rashes   Labs:   All laboratory work was reviewed including any pertinent negatives or positives listed below:  Labs Reviewed  COMPREHENSIVE METABOLIC PANEL - Abnormal; Notable for the following:  Sodium 130 (*)    Potassium  3.4 (*)    Chloride 98 (*)    CO2 20 (*)    Glucose, Bld 193 (*)    All other components within normal limits  CBC - Abnormal; Notable for the following:    RBC 4.01 (*)    HCT 37.4 (*)    All other components within normal limits  GLUCOSE, CAPILLARY - Abnormal; Notable for the following:    Glucose-Capillary 189 (*)    All other components within normal limits  GLUCOSE, CAPILLARY - Abnormal; Notable for the following:    Glucose-Capillary 133 (*)    All other components within normal limits  LIPASE, BLOOD   patient's sodium is slightly low which may be reflected from his hyperglycemia. Otherwise his lab work appears to be in grossly normal limits. He has normal renal function.  Radiology:    DG Chest 2 View (Final result) Result time: 12/17/15 15:52:45   Final result by Rad Results In Interface (12/17/15 15:52:45)   Narrative:   CLINICAL DATA: Cough and vomiting for 2-3 days  EXAM: CHEST 2 VIEW  COMPARISON: None.  FINDINGS: The heart size and vascular pattern normal. There are no pleural effusions. The left lung is clear. There are mildly prominent markings medial right lower lobe. This appears most consistent with atelectasis. The possibility of infiltrate related to pneumonia is difficult to exclude entirely.  IMPRESSION: Probable right lower lobe atelectasis although possibility of pneumonia not entirely excluded. Follow-up if clinically indicated.   Electronically Signed By: Skipper Cliche M.D. On: 12/17/2015 15:52      I personally reviewed the radiologic studies     ED Course:  Patient will be given a 1 L normal saline bolus. He had some relief with the sublingual Zofran in the triage area on the also given IV Zofran. He does not appear to be anemic or significantly dehydrated. I felt his chest x-ray was unlikely to be community-acquired pneumonia. He has no productive cough or chest pain and his white blood cell count is normal. He is  currently afebrile.    Assessment:  Nausea and vomiting with mild dehydration    Plan: * Outpatient management Patient was advised to return immediately if condition worsens. Patient was advised to follow up with their primary care physician or other specialized physicians involved in their outpatient care. The patient and/or family member/power of attorney had laboratory results reviewed at the bedside. All questions and concerns were addressed and appropriate discharge instructions were distributed by the nursing staff.            Daymon Larsen, MD 12/18/15 [phone]

## 2015-12-24 ENCOUNTER — Ambulatory Visit (INDEPENDENT_AMBULATORY_CARE_PROVIDER_SITE_OTHER): Payer: TRICARE For Life (TFL) | Admitting: Family Medicine

## 2015-12-24 ENCOUNTER — Encounter: Payer: Self-pay | Admitting: Family Medicine

## 2015-12-24 VITALS — BP 111/67 | HR 73 | Temp 97.4°F | Ht 69.6 in | Wt 189.0 lb

## 2015-12-24 DIAGNOSIS — K627 Radiation proctitis: Secondary | ICD-10-CM | POA: Diagnosis not present

## 2015-12-24 DIAGNOSIS — R05 Cough: Secondary | ICD-10-CM | POA: Diagnosis not present

## 2015-12-24 DIAGNOSIS — R059 Cough, unspecified: Secondary | ICD-10-CM

## 2015-12-24 NOTE — Progress Notes (Signed)
BP 111/67 mmHg  Pulse 73  Temp(Src) 97.4 F (36.3 C)  Ht 5' 9.6" (1.768 m)  Wt 189 lb (85.73 kg)  BMI 27.43 kg/m2  SpO2 95%   Subjective:    Patient ID: Troy Christensen, male    DOB: 08-12-1951, 65 y.o.   MRN: [id number]  HPI: Troy Christensen is a 65 y.o. male  Chief Complaint  Patient presents with  . Follow-up    ED on Monday 10/20/15  . Rectal Bleeding   Patient in emergency room for gastroenteritis with IV hydration and Zofran has recovered. On review of notes there is some thoughts of pneumonia which on further review there is no pneumonia patient was continued to cough nonproductive otherwise feels okay no further nausea vomiting Has had intermittent blood in stool probably 4 out of the last 7 days sometimes is biggest a small clot CBC reviewed from emergency room a week ago was back to normal. Patient had some previously low hemoglobins. This problems felt to have been from radiation proctitis from radiation completed about 8 months ago.  Relevant past medical, surgical, family and social history reviewed and updated as indicated. Interim medical history since our last visit reviewed. Allergies and medications reviewed and updated.  Review of Systems  Constitutional: Negative.   Respiratory: Positive for cough.   Cardiovascular: Negative.   Gastrointestinal: Negative.     Per HPI unless specifically indicated above     Objective:    BP 111/67 mmHg  Pulse 73  Temp(Src) 97.4 F (36.3 C)  Ht 5' 9.6" (1.768 m)  Wt 189 lb (85.73 kg)  BMI 27.43 kg/m2  SpO2 95%  Wt Readings from Last 3 Encounters:  12/24/15 189 lb (85.73 kg)  12/17/15 202 lb (91.627 kg)  11/20/15 202 lb (91.627 kg)    Physical Exam  Constitutional: He is oriented to person, place, and time. He appears well-developed and well-nourished. No distress.  HENT:  Head: Normocephalic and atraumatic.  Right Ear: Hearing normal.  Left Ear: Hearing normal.  Nose: Nose normal.  Eyes: Conjunctivae  and lids are normal. Right eye exhibits no discharge. Left eye exhibits no discharge. No scleral icterus.  Cardiovascular: Normal rate, regular rhythm and normal heart sounds.   Pulmonary/Chest: Effort normal and breath sounds normal. No respiratory distress.  Musculoskeletal: Normal range of motion.  Neurological: He is alert and oriented to person, place, and time.  Skin: Skin is intact. No rash noted.  Psychiatric: He has a normal mood and affect. His speech is normal and behavior is normal. Judgment and thought content normal. Cognition and memory are normal.    Results for orders placed or performed during the hospital encounter of 12/18/15  Lipase, blood  Result Value Ref Range   Lipase 23 11 - 51 U/L  Comprehensive metabolic panel  Result Value Ref Range   Sodium 130 (L) 135 - 145 mmol/L   Potassium 3.4 (L) 3.5 - 5.1 mmol/L   Chloride 98 (L) 101 - 111 mmol/L   CO2 20 (L) 22 - 32 mmol/L   Glucose, Bld 193 (H) 65 - 99 mg/dL   BUN 15 6 - 20 mg/dL   Creatinine, Ser 1.03 0.61 - 1.24 mg/dL   Calcium 9.2 8.9 - 10.3 mg/dL   Total Protein 7.5 6.5 - 8.1 g/dL   Albumin 4.3 3.5 - 5.0 g/dL   AST 29 15 - 41 U/L   ALT 28 17 - 63 U/L   Alkaline Phosphatase 49 38 - 126  U/L   Total Bilirubin 0.9 0.3 - 1.2 mg/dL   GFR calc non Af Amer >60 >60 mL/min   GFR calc Af Amer >60 >60 mL/min   Anion gap 12 5 - 15  CBC  Result Value Ref Range   WBC 4.9 3.8 - 10.6 K/uL   RBC 4.01 (L) 4.40 - 5.90 MIL/uL   Hemoglobin 13.0 13.0 - 18.0 g/dL   HCT 37.4 (L) 40.0 - 52.0 %   MCV 93.3 80.0 - 100.0 fL   MCH 32.4 26.0 - 34.0 pg   MCHC 34.7 32.0 - 36.0 g/dL   RDW 14.1 11.5 - 14.5 %   Platelets 155 150 - 440 K/uL  Glucose, capillary  Result Value Ref Range   Glucose-Capillary 189 (H) 65 - 99 mg/dL   Comment 1 Notify RN   Glucose, capillary  Result Value Ref Range   Glucose-Capillary 133 (H) 65 - 99 mg/dL      Assessment & Plan:   Problem List Items Addressed This Visit      Digestive   Radiation  proctitis - Primary    With intermittent bleeding continuing and intermittent anemia patient will take over-the-counter iron and will make appointment with gastroenterology to further evaluate.      Relevant Orders   Ambulatory referral to Gastroenterology    Other Visit Diagnoses    Cough        Discussed cough care and treatment gave sample of Brio patient will use 1 puff a day        Follow up plan: Return for As scheduled.

## 2015-12-24 NOTE — Assessment & Plan Note (Signed)
With intermittent bleeding continuing and intermittent anemia patient will take over-the-counter iron and will make appointment with gastroenterology to further evaluate.

## 2016-01-07 ENCOUNTER — Other Ambulatory Visit: Payer: Self-pay

## 2016-01-07 ENCOUNTER — Encounter: Payer: Self-pay | Admitting: Gastroenterology

## 2016-01-07 ENCOUNTER — Ambulatory Visit (INDEPENDENT_AMBULATORY_CARE_PROVIDER_SITE_OTHER): Payer: TRICARE For Life (TFL) | Admitting: Gastroenterology

## 2016-01-07 VITALS — BP 145/78 | HR 59 | Temp 96.5°F | Ht 70.0 in | Wt 200.0 lb

## 2016-01-07 DIAGNOSIS — K921 Melena: Secondary | ICD-10-CM

## 2016-01-07 NOTE — Progress Notes (Signed)
Primary Care Physician: Golden Pop, MD  Primary Gastroenterologist:  Dr. Lucilla Lame  Chief Complaint  Patient presents with  . Rectal Bleeding    Status Post Radiation for Prostate Cancer 06/2015    HPI: Troy Christensen is a 65 y.o. male here who comes here for follow-up of rectal bleeding. The patient had a colonoscopy by me last year without any findings. Since then the patient has been diagnosed and treated for prostate cancer. The patient received radiation to the rectum. The patient now reports that he has had 2 episodes of bright red blood per rectum. He denies any abdominal pain nausea vomiting fevers chills or unexplained weight loss. The patient also reports that his PSA has come back to normal. The patient has no other problems at the present time and denies any constipation or change in bowel habits.  Current Outpatient Prescriptions  Medication Sig Dispense Refill  . allopurinol (ZYLOPRIM) 100 MG tablet Take 1 tablet (100 mg total) by mouth daily. 90 tablet 4  . amLODipine-benazepril (LOTREL) 5-20 MG capsule TAKE 2 CAPSULES ONCE EACH DAY 180 capsule 4  . aspirin 81 MG tablet Take 81 mg by mouth daily. Reported on 11/20/2015    . glucose blood test strip 1 each by Other route as needed for other. Use as instructed    . hydrocortisone (ANUSOL-HC) 25 MG suppository Place 1 suppository (25 mg total) rectally 2 (two) times daily. 12 suppository 2  . lovastatin (MEVACOR) 40 MG tablet Take 1 tablet (40 mg total) by mouth daily. 90 tablet 4  . meloxicam (MOBIC) 15 MG tablet Take 1 tablet (15 mg total) by mouth daily. 90 tablet 4  . metFORMIN (GLUCOPHAGE-XR) 500 MG 24 hr tablet Take 2 tablets (1,000 mg total) by mouth 2 (two) times daily. 360 tablet 4  . ondansetron (ZOFRAN) 4 MG tablet Take 1 tablet (4 mg total) by mouth every 8 (eight) hours as needed for nausea or vomiting. 21 tablet 0  . pioglitazone (ACTOS) 45 MG tablet Take 1 tablet (45 mg total) by mouth daily. 90 tablet 4  .  sildenafil (REVATIO) 20 MG tablet Take 1 tablet (20 mg total) by mouth daily as needed. 10 tablet 5  . tamsulosin (FLOMAX) 0.4 MG CAPS capsule Take 0.4 mg by mouth daily.    . timolol (TIMOPTIC) 0.5 % ophthalmic solution 1 drop 2 (two) times daily.     No current facility-administered medications for this visit.    Allergies as of 01/07/2016 - Review Complete 01/07/2016  Allergen Reaction Noted  . Naprosyn [naproxen] Other (See Comments) 01/18/2015    ROS:  General: Negative for anorexia, weight loss, fever, chills, fatigue, weakness. ENT: Negative for hoarseness, difficulty swallowing , nasal congestion. CV: Negative for chest pain, angina, palpitations, dyspnea on exertion, peripheral edema.  Respiratory: Negative for dyspnea at rest, dyspnea on exertion, cough, sputum, wheezing.  GI: See history of present illness. GU:  Negative for dysuria, hematuria, urinary incontinence, urinary frequency, nocturnal urination.  Endo: Negative for unusual weight change.    Physical Examination:   BP 145/78 mmHg  Pulse 59  Temp(Src) 96.5 F (35.8 C) (Oral)  Ht 5\' 10"  (1.778 m)  Wt 200 lb (90.719 kg)  BMI 28.70 kg/m2  General: Well-nourished, well-developed in no acute distress.  Eyes: No icterus. Conjunctivae pink. Mouth: Oropharyngeal mucosa moist and pink , no lesions erythema or exudate. Lungs: Clear to auscultation bilaterally. Non-labored. Heart: Regular rate and rhythm, no murmurs rubs or gallops.  Abdomen: Bowel  sounds are normal, nontender, nondistended, no hepatosplenomegaly or masses, no abdominal bruits or hernia , no rebound or guarding.   Extremities: No lower extremity edema. No clubbing or deformities. Neuro: Alert and oriented x 3.  Grossly intact. Skin: Warm and dry, no jaundice.   Psych: Alert and cooperative, normal mood and affect.  Labs:    Imaging Studies: Dg Chest 2 View  12/17/2015  CLINICAL DATA:  Cough and vomiting for 2-3 days EXAM: CHEST  2 VIEW  COMPARISON:  None. FINDINGS: The heart size and vascular pattern normal. There are no pleural effusions. The left lung is clear. There are mildly prominent markings medial right lower lobe. This appears most consistent with atelectasis. The possibility of infiltrate related to pneumonia is difficult to exclude entirely. IMPRESSION: Probable right lower lobe atelectasis although possibility of pneumonia not entirely excluded. Follow-up if clinically indicated. Electronically Signed   By: Skipper Cliche M.D.   On: 12/17/2015 15:52    Assessment and Plan:   Troy Christensen is a 65 y.o. y/o male who comes in today with 2 episodes of rectal bleeding. 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 The patient will be set up for a flexible sigmoidoscopy with possible argon plasma coagulation. The patient has been explained the plan and agrees with it.   Note: This dictation was prepared with Dragon dictation along with smaller phrase technology. Any transcriptional errors that result from this process are unintentional.

## 2016-01-08 ENCOUNTER — Encounter: Payer: Self-pay | Admitting: *Deleted

## 2016-01-08 ENCOUNTER — Other Ambulatory Visit: Payer: Self-pay

## 2016-01-09 NOTE — Discharge Instructions (Signed)

## 2016-01-10 ENCOUNTER — Ambulatory Visit: Admitting: Anesthesiology

## 2016-01-10 ENCOUNTER — Ambulatory Visit
Admission: RE | Admit: 2016-01-10 | Discharge: 2016-01-10 | Disposition: A | Discharge status: Home / Self Care | Source: Ambulatory Visit | Attending: Gastroenterology | Admitting: Gastroenterology

## 2016-01-10 ENCOUNTER — Encounter
Admission: RE | Disposition: A | Discharge status: Home / Self Care | Payer: Self-pay | Source: Ambulatory Visit | Attending: Gastroenterology

## 2016-01-10 DIAGNOSIS — Z7984 Long term (current) use of oral hypoglycemic drugs: Secondary | ICD-10-CM | POA: Diagnosis not present

## 2016-01-10 DIAGNOSIS — M109 Gout, unspecified: Secondary | ICD-10-CM | POA: Insufficient documentation

## 2016-01-10 DIAGNOSIS — Z808 Family history of malignant neoplasm of other organs or systems: Secondary | ICD-10-CM | POA: Diagnosis not present

## 2016-01-10 DIAGNOSIS — Z923 Personal history of irradiation: Secondary | ICD-10-CM | POA: Insufficient documentation

## 2016-01-10 DIAGNOSIS — Z96651 Presence of right artificial knee joint: Secondary | ICD-10-CM | POA: Diagnosis not present

## 2016-01-10 DIAGNOSIS — E7800 Pure hypercholesterolemia, unspecified: Secondary | ICD-10-CM | POA: Insufficient documentation

## 2016-01-10 DIAGNOSIS — K5521 Angiodysplasia of colon with hemorrhage: Secondary | ICD-10-CM | POA: Insufficient documentation

## 2016-01-10 DIAGNOSIS — K921 Melena: Secondary | ICD-10-CM | POA: Diagnosis not present

## 2016-01-10 DIAGNOSIS — I10 Essential (primary) hypertension: Secondary | ICD-10-CM | POA: Insufficient documentation

## 2016-01-10 DIAGNOSIS — Z8546 Personal history of malignant neoplasm of prostate: Secondary | ICD-10-CM | POA: Insufficient documentation

## 2016-01-10 DIAGNOSIS — M19041 Primary osteoarthritis, right hand: Secondary | ICD-10-CM | POA: Diagnosis not present

## 2016-01-10 DIAGNOSIS — E119 Type 2 diabetes mellitus without complications: Secondary | ICD-10-CM | POA: Diagnosis not present

## 2016-01-10 DIAGNOSIS — H409 Unspecified glaucoma: Secondary | ICD-10-CM | POA: Insufficient documentation

## 2016-01-10 DIAGNOSIS — Z820 Family history of epilepsy and other diseases of the nervous system: Secondary | ICD-10-CM | POA: Insufficient documentation

## 2016-01-10 DIAGNOSIS — M19042 Primary osteoarthritis, left hand: Secondary | ICD-10-CM | POA: Insufficient documentation

## 2016-01-10 DIAGNOSIS — Z888 Allergy status to other drugs, medicaments and biological substances status: Secondary | ICD-10-CM | POA: Insufficient documentation

## 2016-01-10 DIAGNOSIS — Z825 Family history of asthma and other chronic lower respiratory diseases: Secondary | ICD-10-CM | POA: Diagnosis not present

## 2016-01-10 DIAGNOSIS — Z79899 Other long term (current) drug therapy: Secondary | ICD-10-CM | POA: Insufficient documentation

## 2016-01-10 DIAGNOSIS — Z801 Family history of malignant neoplasm of trachea, bronchus and lung: Secondary | ICD-10-CM | POA: Insufficient documentation

## 2016-01-10 HISTORY — PX: FLEXIBLE SIGMOIDOSCOPY: SHX5431

## 2016-01-10 LAB — GLUCOSE, CAPILLARY
GLUCOSE-CAPILLARY: 117 mg/dL — AB (ref 65–99)
Glucose-Capillary: 111 mg/dL — ABNORMAL HIGH (ref 65–99)

## 2016-01-10 SURGERY — SIGMOIDOSCOPY, FLEXIBLE
Anesthesia: Monitor Anesthesia Care | Wound class: Contaminated

## 2016-01-10 MED ORDER — STERILE WATER FOR IRRIGATION IR SOLN
Status: DC | PRN
  Administered 2016-01-10: 08:00:00

## 2016-01-10 MED ORDER — PROPOFOL 10 MG/ML IV BOLUS
INTRAVENOUS | Status: DC | PRN
  Administered 2016-01-10 (×6): 20 mg via INTRAVENOUS

## 2016-01-10 MED ORDER — LACTATED RINGERS IV SOLN
INTRAVENOUS | Status: DC
  Administered 2016-01-10: 07:00:00 via INTRAVENOUS

## 2016-01-10 MED ORDER — LACTATED RINGERS IV SOLN: 500.0000 mL | INTRAVENOUS | Status: DC

## 2016-01-10 SURGICAL SUPPLY — 22 items
CANISTER SUCT 1200ML W/VALVE (MISCELLANEOUS) ×3 IMPLANT
CLIP HMST 235XBRD CATH ROT (MISCELLANEOUS) IMPLANT
CLIP RESOLUTION 360 11X235 (MISCELLANEOUS)
FCP ESCP3.2XJMB 240X2.8X (MISCELLANEOUS)
FORCEPS BIOP RAD 4 LRG CAP 4 (CUTTING FORCEPS) IMPLANT
FORCEPS BIOP RJ4 240 W/NDL (MISCELLANEOUS)
FORCEPS ESCP3.2XJMB 240X2.8X (MISCELLANEOUS) IMPLANT
GOWN CVR UNV OPN BCK APRN NK (MISCELLANEOUS) ×2 IMPLANT
GOWN ISOL THUMB LOOP REG UNIV (MISCELLANEOUS) ×6
INJECTOR VARIJECT VIN23 (MISCELLANEOUS) IMPLANT
KIT DEFENDO VALVE AND CONN (KITS) IMPLANT
KIT ENDO PROCEDURE OLY (KITS) ×3 IMPLANT
MARKER SPOT ENDO TATTOO 5ML (MISCELLANEOUS) IMPLANT
PAD GROUND ADULT SPLIT (MISCELLANEOUS) IMPLANT
PROBE APC STR FIRE (PROBE) ×2 IMPLANT
SNARE SHORT THROW 13M SML OVAL (MISCELLANEOUS) IMPLANT
SNARE SHORT THROW 30M LRG OVAL (MISCELLANEOUS) IMPLANT
SNARE SNG USE RND 15MM (INSTRUMENTS) IMPLANT
SPOT EX ENDOSCOPIC TATTOO (MISCELLANEOUS)
TRAP ETRAP POLY (MISCELLANEOUS) IMPLANT
VARIJECT INJECTOR VIN23 (MISCELLANEOUS)
WATER STERILE IRR 250ML POUR (IV SOLUTION) ×3 IMPLANT

## 2016-01-10 NOTE — Transfer of Care (Signed)
Immediate Anesthesia Transfer of Care Note  Patient: Troy Christensen  Procedure(s) Performed: Procedure(s) with comments: FLEXIBLE SIGMOIDOSCOPY with  argon plasma coagulation (N/A) - Diabetic - oral meds  Patient Location: PACU  Anesthesia Type: MAC  Level of Consciousness: awake, alert  and patient cooperative  Airway and Oxygen Therapy: Patient Spontanous Breathing and Patient connected to supplemental oxygen  Post-op Assessment: Post-op Vital signs reviewed, Patient's Cardiovascular Status Stable, Respiratory Function Stable, Patent Airway and No signs of Nausea or vomiting  Post-op Vital Signs: Reviewed and stable  Complications: No apparent anesthesia complications

## 2016-01-10 NOTE — Anesthesia Procedure Notes (Signed)
Procedure Name: MAC Performed by: Anilah Huck Pre-anesthesia Checklist: Patient identified, Emergency Drugs available, Suction available, Timeout performed and Patient being monitored Patient Re-evaluated:Patient Re-evaluated prior to inductionOxygen Delivery Method: Nasal cannula Placement Confirmation: positive ETCO2     

## 2016-01-10 NOTE — H&P (Signed)
[facility]  [address], [place], [place] [address] Phone: [phone] Fax : [phone]  Primary Care Physician:  Golden Pop, MD Primary Gastroenterologist:  Dr. Allen Norris  Pre-Procedure History & Physical: HPI:  Troy Christensen is a 65 y.o. male is here for an flexible sigmoidoscopy.   Past Medical History  Diagnosis Date  . Cancer [facility])     Prostate  . Prostate cancer ([place])   . Glaucoma   . Hypertension   . Hypercholesteremia   . Gout   . Osteoarthritis     hands  . Diabetes mellitus without complication [facility])     Past Surgical History  Procedure Laterality Date  . Joint replacement  2014    Right Knee Replacement  . Rotator cuff repair Right 2012  . Hernia repair    . Nephrolithiasis      pt denies  . Meniscus repair Left   . Prostate surgery      Radiation treatments - no surgery    Prior to Admission medications   Medication Sig Start Date End Date Taking? Authorizing Provider  allopurinol (ZYLOPRIM) 100 MG tablet Take 1 tablet (100 mg total) by mouth daily. 11/20/15  Yes Guadalupe Maple, MD  amLODipine-benazepril (LOTREL) 5-20 MG capsule TAKE 2 CAPSULES ONCE EACH DAY 11/20/15  Yes Guadalupe Maple, MD  aspirin 81 MG tablet Take 81 mg by mouth daily. Reported on 11/20/2015   Yes Historical Provider, MD  hydrocortisone (ANUSOL-HC) 25 MG suppository Place 1 suppository (25 mg total) rectally 2 (two) times daily. 11/20/15  Yes Guadalupe Maple, MD  lovastatin (MEVACOR) 40 MG tablet Take 1 tablet (40 mg total) by mouth daily. 11/20/15  Yes Guadalupe Maple, MD  meloxicam (MOBIC) 15 MG tablet Take 1 tablet (15 mg total) by mouth daily. 11/20/15  Yes Guadalupe Maple, MD  metFORMIN (GLUCOPHAGE-XR) 500 MG 24 hr tablet Take 2 tablets (1,000 mg total) by mouth 2 (two) times daily. 12/03/15  Yes Guadalupe Maple, MD  ondansetron (ZOFRAN) 4 MG tablet Take 1 tablet (4 mg total) by mouth every 8 (eight) hours as needed for nausea or vomiting. 12/18/15  Yes Daymon Larsen, MD  pioglitazone (ACTOS) 45 MG tablet Take 1 tablet (45 mg total) by mouth daily. 11/20/15  Yes Guadalupe Maple, MD  sildenafil (REVATIO) 20 MG tablet Take 1 tablet (20 mg total) by mouth daily as needed. 06/12/15  Yes Noreene Filbert, MD  tamsulosin (FLOMAX) 0.4 MG CAPS capsule Take 0.4 mg by mouth daily. 08/25/15  Yes Historical Provider, MD  timolol (TIMOPTIC) 0.5 % ophthalmic solution 1 drop 2 (two) times daily.   Yes Historical Provider, MD  glucose blood test strip 1 each by Other route as needed for other. Use as instructed    Historical Provider, MD    Allergies as of 01/08/2016 - Review Complete 01/08/2016  Allergen Reaction Noted  . Naprosyn [naproxen] Other (See Comments) 01/18/2015    Family History  Problem Relation Age of Onset  . Cancer Father 85    lung  . Cancer Brother     melanoma  . Emphysema Mother   . Dementia Mother     Social History   Social History  . Marital Status: Married    Spouse Name: N/A  . Number of Children: N/A  . Years of Education: N/A   Occupational History  . Not on file.   Social History Main Topics  . Smoking status: Former Smoker  Types: Cigarettes    Quit date: 03/26/1975  . Smokeless tobacco: Never Used  . Alcohol Use: No  . Drug Use: No  . Sexual Activity: Not on file   Other Topics Concern  . Not on file   Social History Narrative    Review of Systems: See HPI, otherwise negative ROS  Physical Exam: BP 136/81 mmHg  Pulse 56  Temp(Src) 97.7 F (36.5 C) (Temporal)  Resp 16  Ht 5\' 10"  (1.778 m)  Wt 197 lb (89.359 kg)  BMI 28.27 kg/m2  SpO2 100% General:   Alert,  pleasant and cooperative in NAD Head:  Normocephalic and atraumatic. Neck:  Supple; no masses or thyromegaly. Lungs:  Clear throughout to auscultation.    Heart:  Regular rate and rhythm. Abdomen:  Soft, nontender and nondistended. Normal bowel sounds, without guarding, and without rebound.   Neurologic:  Alert and  oriented x4;  grossly  normal neurologically.  Impression/Plan: Troy Christensen is here for an flexible sigmoidoscopy to be performed for hematochezia  Risks, benefits, limitations, and alternatives regarding  flexible sigmoidoscopy have been reviewed with the patient.  Questions have been answered.  All parties agreeable.   Ollen Bowl, MD  01/10/2016, 7:41 AM

## 2016-01-10 NOTE — Anesthesia Preprocedure Evaluation (Addendum)
Anesthesia Evaluation  Patient identified by MRN, date of birth, ID band Patient awake    Reviewed: Allergy & Precautions, H&P , NPO status , Patient's Chart, lab work & pertinent test results, reviewed documented beta blocker date and time   Airway Mallampati: III  TM Distance: >3 FB Neck ROM: full    Dental no notable dental hx.    Pulmonary former smoker,    Pulmonary exam normal breath sounds clear to auscultation       Cardiovascular Exercise Tolerance: Good hypertension, On Medications  Rhythm:regular Rate:Normal     Neuro/Psych negative neurological ROS  negative psych ROS   GI/Hepatic negative GI ROS, Neg liver ROS,   Endo/Other  diabetes, Well Controlled, Type 2, Oral Hypoglycemic Agents  Renal/GU negative Renal ROS  negative genitourinary   Musculoskeletal   Abdominal   Peds  Hematology negative hematology ROS (+)   Anesthesia Other Findings   Reproductive/Obstetrics negative OB ROS                            Anesthesia Physical Anesthesia Plan  ASA: II  Anesthesia Plan: MAC   Post-op Pain Management:    Induction:   Airway Management Planned:   Additional Equipment:   Intra-op Plan:   Post-operative Plan:   Informed Consent: I have reviewed the patients History and Physical, chart, labs and discussed the procedure including the risks, benefits and alternatives for the proposed anesthesia with the patient or authorized representative who has indicated his/her understanding and acceptance.     Plan Discussed with: CRNA  Anesthesia Plan Comments:         Anesthesia Quick Evaluation

## 2016-01-10 NOTE — Anesthesia Postprocedure Evaluation (Signed)
Anesthesia Post Note  Patient: Troy Christensen  Procedure(s) Performed: Procedure(s) (LRB): FLEXIBLE SIGMOIDOSCOPY with  argon plasma coagulation (N/A)  Patient location during evaluation: PACU Anesthesia Type: MAC Level of consciousness: awake and alert Pain management: pain level controlled Vital Signs Assessment: post-procedure vital signs reviewed and stable Respiratory status: spontaneous breathing, nonlabored ventilation and respiratory function stable Cardiovascular status: blood pressure returned to baseline and stable Postop Assessment: no signs of nausea or vomiting Anesthetic complications: no    Lyle Niblett D Ryne Mctigue

## 2016-01-10 NOTE — Op Note (Signed)
[facility] Patient Name: Troy Christensen Procedure Date: 01/10/2016 7:56 AM MRN: [id number] Account #: [id number] Date of Birth: July 24, 1951 Admit Type: Outpatient Age: 65 Room: [facility] OR ROOM 01 Gender: Male Note Status: Finalized Procedure:            Flexible Sigmoidoscopy Indications:          Hematochezia Providers:            Lucilla Lame, MD Referring MD:         Guadalupe Maple, MD (Referring MD) Medicines:            Propofol per Anesthesia Complications:        No immediate complications. Procedure:            Pre-Anesthesia Assessment:                       - Prior to the procedure, a History and Physical was                        performed, and patient medications and allergies were                        reviewed. The patient's tolerance of previous                        anesthesia was also reviewed. The risks and benefits of                        the procedure and the sedation options and risks were                        discussed with the patient. All questions were                        answered, and informed consent was obtained. Prior                        Anticoagulants: The patient has taken no previous                        anticoagulant or antiplatelet agents. ASA Grade                        Assessment: II - A patient with mild systemic disease.                        After reviewing the risks and benefits, the patient was                        deemed in satisfactory condition to undergo the                        procedure.                       After obtaining informed consent, the scope was passed                        under direct vision. The Endoscope was introduced  through the anus and advanced to the the sigmoid colon.                        The flexible sigmoidoscopy was accomplished without                        difficulty. The patient tolerated the procedure well.      The quality of the bowel preparation was excellent. Findings:      The perianal and digital rectal examinations were normal.      Multiple large patchy angiodysplastic lesions with bleeding were found       in the rectum. Coagulation for tissue destruction using argon beam at 2       liters/minute and 40 watts was successful. Impression:           - Multiple bleeding colonic angiodysplastic lesions.                        Treated with argon beam coagulation.                       - No specimens collected. Recommendation:       - Repeat flexible sigmoidoscopy PRN for retreatment. Procedure Code(s):    --- Professional ---                       [phone], Sigmoidoscopy, flexible; with control of                        bleeding, any method Diagnosis Code(s):    --- Professional ---                       K92.1, Melena (includes Hematochezia)                       K55.21, Angiodysplasia of colon with hemorrhage CPT copyright 2016 American Medical Association. All rights reserved. The codes documented in this report are preliminary and upon coder review may  be revised to meet current compliance requirements. Lucilla Lame, MD 01/10/2016 8:14:46 AM This report has been signed electronically. Number of Addenda: 0 Note Initiated On: 01/10/2016 7:56 AM      [facility]

## 2016-01-10 NOTE — Anesthesia Postprocedure Evaluation (Signed)
Anesthesia Post Note  Patient: Troy Christensen  Procedure(s) Performed: Procedure(s) (LRB): FLEXIBLE SIGMOIDOSCOPY with  argon plasma coagulation (N/A)  Patient location during evaluation: PACU Anesthesia Type: MAC Level of consciousness: awake and alert Pain management: pain level controlled Vital Signs Assessment: post-procedure vital signs reviewed and stable Respiratory status: spontaneous breathing, nonlabored ventilation and respiratory function stable Cardiovascular status: blood pressure returned to baseline and stable Postop Assessment: no signs of nausea or vomiting Anesthetic complications: no    DANIEL D KOVACS

## 2016-01-11 ENCOUNTER — Encounter: Payer: Self-pay | Admitting: Gastroenterology

## 2016-03-06 ENCOUNTER — Encounter: Payer: Self-pay | Admitting: Family Medicine

## 2016-03-06 ENCOUNTER — Ambulatory Visit (INDEPENDENT_AMBULATORY_CARE_PROVIDER_SITE_OTHER): Payer: TRICARE For Life (TFL) | Admitting: Family Medicine

## 2016-03-06 VITALS — BP 112/69 | HR 60 | Temp 98.0°F | Ht 70.0 in | Wt 199.0 lb

## 2016-03-06 DIAGNOSIS — M1990 Unspecified osteoarthritis, unspecified site: Secondary | ICD-10-CM

## 2016-03-06 DIAGNOSIS — I10 Essential (primary) hypertension: Secondary | ICD-10-CM

## 2016-03-06 DIAGNOSIS — T560X1D Toxic effect of lead and its compounds, accidental (unintentional), subsequent encounter: Secondary | ICD-10-CM | POA: Diagnosis not present

## 2016-03-06 DIAGNOSIS — E119 Type 2 diabetes mellitus without complications: Secondary | ICD-10-CM

## 2016-03-06 DIAGNOSIS — M1A1790 Lead-induced chronic gout, unspecified ankle and foot, without tophus (tophi): Secondary | ICD-10-CM | POA: Diagnosis not present

## 2016-03-06 DIAGNOSIS — G5602 Carpal tunnel syndrome, left upper limb: Secondary | ICD-10-CM | POA: Diagnosis not present

## 2016-03-06 LAB — BAYER DCA HB A1C WAIVED: HB A1C (BAYER DCA - WAIVED): 7.1 % — ABNORMAL HIGH (ref <7.0)

## 2016-03-06 NOTE — Assessment & Plan Note (Signed)
Condition stable. Tylenol as needed for pain control

## 2016-03-06 NOTE — Progress Notes (Signed)
BP 112/69 mmHg  Pulse 60  Temp(Src) 98 F (36.7 C)  Ht 5\' 10"  (1.778 m)  Wt 199 lb (90.266 kg)  BMI 28.55 kg/m2  SpO2 99%   Subjective:    Patient ID: Troy Christensen, male    DOB: [birth year], 65 y.o.   MRN: [id number]  HPI: Troy Christensen is a 65 y.o. male  Chief Complaint  Patient presents with  . Diabetes  Patient presents today for routine follow up for his chronic conditions. Has had some recent concerns with hand numbness and tingling. All fingers, but especially thumb on left hand. Has a wrist brace that he has been trying to wear for relief. Drives for work every day, lots of repetitive movements.   DM- Continues to work on lifestyle modification. BS running pretty well when he checks at home. Usually 104-120. Compliant with medications. No reported side effects.  HTN - BPs doing well, no dizziness, CP, or syncope.  Gout - Compliant with allopurinol. Had an episode on left great toe a few weeks ago, first episode in 5-6 years. Symptoms resolved within 2-3 days with colchicine.  OA - Stable      Relevant past medical, surgical, family and social history reviewed and updated as indicated. Interim medical history since our last visit reviewed. Allergies and medications reviewed and updated.  Review of Systems  Constitutional: Negative.   Respiratory: Negative.   Cardiovascular: Negative.   Endocrine: Negative.   Musculoskeletal: Negative.   Neurological: Positive for numbness (left hand).  Psychiatric/Behavioral: Negative.     Per HPI unless specifically indicated above     Objective:    BP 112/69 mmHg  Pulse 60  Temp(Src) 98 F (36.7 C)  Ht 5\' 10"  (1.778 m)  Wt 199 lb (90.266 kg)  BMI 28.55 kg/m2  SpO2 99%  Wt Readings from Last 3 Encounters:  03/06/16 199 lb (90.266 kg)  01/10/16 197 lb (89.359 kg)  01/07/16 200 lb (90.719 kg)    Physical Exam  Constitutional: He is oriented to person, place, and time. He appears well-developed and  well-nourished. No distress.  HENT:  Head: Normocephalic and atraumatic.  Right Ear: Hearing normal.  Left Ear: Hearing normal.  Nose: Nose normal.  Eyes: Conjunctivae and lids are normal. Right eye exhibits no discharge. Left eye exhibits no discharge. No scleral icterus.  Pulmonary/Chest: Effort normal. No respiratory distress.  Musculoskeletal: Normal range of motion.  + Tinel's sign on left wrist  Neurological: He is alert and oriented to person, place, and time.  Skin: Skin is intact. No rash noted.  Psychiatric: He has a normal mood and affect. His speech is normal and behavior is normal. Judgment and thought content normal. Cognition and memory are normal.    Results for orders placed or performed during the hospital encounter of 01/10/16  Glucose, capillary  Result Value Ref Range   Glucose-Capillary 111 (H) 65 - 99 mg/dL  Glucose, capillary  Result Value Ref Range   Glucose-Capillary 117 (H) 65 - 99 mg/dL      Assessment & Plan:   Problem List Items Addressed This Visit      Cardiovascular and Mediastinum   Hypertension    The current medical regimen is effective;  continue present plan and medications.         Endocrine   Diabetes mellitus ([place]) - Primary    A1C much improved, the current medical regimen is effective;  continue present plan and medications.  Relevant Orders   Bayer DCA Hb A1c Waived     Musculoskeletal and Integument   Osteoarthritis    Condition stable. Tylenol as needed for pain control         Other   Gout    Doing fairly well with current regimen. Will call if needing refill on colchicine       Other Visit Diagnoses    Carpal tunnel syndrome, left        Continue to wear wrist brace, modify hand movements at work to reduce repetitive stress. Will refer to orthopedics if no improvement        Follow up plan: Return in about 3 months (around 06/06/2016) for A1c, BMP, lipid,AST, ALT, uric acid.

## 2016-03-06 NOTE — Assessment & Plan Note (Signed)
A1C much improved, the current medical regimen is effective;  continue present plan and medications.

## 2016-03-06 NOTE — Assessment & Plan Note (Signed)
The current medical regimen is effective;  continue present plan and medications.  

## 2016-03-06 NOTE — Assessment & Plan Note (Signed)
Doing fairly well with current regimen. Will call if needing refill on colchicine

## 2016-05-07 ENCOUNTER — Other Ambulatory Visit: Payer: Self-pay | Admitting: *Deleted

## 2016-05-07 DIAGNOSIS — C61 Malignant neoplasm of prostate: Secondary | ICD-10-CM

## 2016-05-09 ENCOUNTER — Inpatient Hospital Stay: Attending: Radiation Oncology

## 2016-05-09 DIAGNOSIS — C61 Malignant neoplasm of prostate: Secondary | ICD-10-CM | POA: Diagnosis not present

## 2016-05-09 LAB — PSA: PSA: 0.04 ng/mL (ref 0.00–4.00)

## 2016-05-15 ENCOUNTER — Ambulatory Visit
Admission: RE | Admit: 2016-05-15 | Discharge: 2016-05-15 | Disposition: A | Discharge status: Home / Self Care | Source: Ambulatory Visit | Attending: Radiation Oncology | Admitting: Radiation Oncology

## 2016-05-15 ENCOUNTER — Other Ambulatory Visit: Payer: Self-pay | Admitting: *Deleted

## 2016-05-15 ENCOUNTER — Encounter: Payer: Self-pay | Admitting: Radiation Oncology

## 2016-05-15 VITALS — BP 146/79 | HR 60 | Temp 97.8°F | Wt 203.2 lb

## 2016-05-15 DIAGNOSIS — Z923 Personal history of irradiation: Secondary | ICD-10-CM | POA: Diagnosis not present

## 2016-05-15 DIAGNOSIS — C61 Malignant neoplasm of prostate: Secondary | ICD-10-CM | POA: Diagnosis present

## 2016-05-15 NOTE — Progress Notes (Signed)
Radiation Oncology Follow up Note  Name: Troy Christensen   Date:   05/15/2016 MRN:  [id number] DOB: 1951-09-01    This 65 y.o. male presents to the clinic today for 11 month follow-up for stage IIB prostate cancer status post I MRT radiation therapy.  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 Gleason 8 (4+4) presenting the PSA of 6.3. He received both prostate and pelvic nodes radiation he is seen today in routine follow-up he is doing well. His most current PSA was 0000000.  COMPLICATIONS OF TREATMENT: none  FOLLOW UP COMPLIANCE: keeps appointments   PHYSICAL EXAM:  BP (!) 146/79   Pulse 60   Temp 97.8 F (36.6 C)   Wt 203 lb 2.5 oz (92.1 kg)   BMI 29.15 kg/m  On rectal exam rectal sphincter tone is good. Prostate is smooth contracted without evidence of nodularity or mass. Sulcus is preserved bilaterally. No discrete nodularity is identified. No other rectal abnormalities are noted. Well-developed well-nourished patient in NAD. HEENT reveals PERLA, EOMI, discs not visualized.  Oral cavity is clear. No oral mucosal lesions are identified. Neck is clear without evidence of cervical or supraclavicular adenopathy. Lungs are clear to A&P. Cardiac examination is essentially unremarkable with regular rate and rhythm without murmur rub or thrill. Abdomen is benign with no organomegaly or masses noted. Motor sensory and DTR levels are equal and symmetric in the upper and lower extremities. Cranial nerves II through XII are grossly intact. Proprioception is intact. No peripheral adenopathy or edema is identified. No motor or sensory levels are noted. Crude visual fields are within normal range.  RADIOLOGY RESULTS: No current films for review  PLAN: At the present time he is doing well. Will discuss with urology like to keep the patient  suppressed for another 6 months with Lupron. Will order that if it is not already been performed. Otherwise I'm please was overall progress. I've asked to see him back in 1 year for follow-up. He continues close follow-up care with urology.  I would like to take this opportunity to thank you for allowing me to participate in the care of your patient.Armstead Peaks., MD

## 2016-05-16 ENCOUNTER — Telehealth: Payer: Self-pay | Admitting: Family Medicine

## 2016-05-16 NOTE — Telephone Encounter (Signed)
Pt came by stated he needs a refill on lancets. Pharm is [facility]. Thanks.

## 2016-05-16 NOTE — Telephone Encounter (Signed)
RX form for lancets filled out, signed by Dr. Wynetta Emery in Dr. Rance Muir absence, and faxed to [place].

## 2016-06-19 ENCOUNTER — Ambulatory Visit (INDEPENDENT_AMBULATORY_CARE_PROVIDER_SITE_OTHER): Payer: TRICARE For Life (TFL) | Admitting: Family Medicine

## 2016-06-19 ENCOUNTER — Encounter: Payer: Self-pay | Admitting: Family Medicine

## 2016-06-19 VITALS — BP 138/76 | HR 63 | Temp 98.2°F | Wt 200.8 lb

## 2016-06-19 DIAGNOSIS — E119 Type 2 diabetes mellitus without complications: Secondary | ICD-10-CM

## 2016-06-19 DIAGNOSIS — I10 Essential (primary) hypertension: Secondary | ICD-10-CM | POA: Diagnosis not present

## 2016-06-19 DIAGNOSIS — E785 Hyperlipidemia, unspecified: Secondary | ICD-10-CM | POA: Diagnosis not present

## 2016-06-19 DIAGNOSIS — M1A09X0 Idiopathic chronic gout, multiple sites, without tophus (tophi): Secondary | ICD-10-CM | POA: Diagnosis not present

## 2016-06-19 LAB — LP+ALT+AST PICCOLO, WAIVED
ALT (SGPT) Piccolo, Waived: 30 U/L (ref 10–47)
AST (SGOT) Piccolo, Waived: 28 U/L (ref 11–38)
CHOL/HDL RATIO PICCOLO,WAIVE: 2.9 mg/dL
Cholesterol Piccolo, Waived: 165 mg/dL (ref <200)
HDL Chol Piccolo, Waived: 57 mg/dL — ABNORMAL LOW (ref >59)
LDL CHOL CALC PICCOLO WAIVED: 68 mg/dL (ref <100)
Triglycerides Piccolo,Waived: 201 mg/dL — ABNORMAL HIGH (ref <150)
VLDL CHOL CALC PICCOLO,WAIVE: 40 mg/dL — AB (ref <30)

## 2016-06-19 LAB — BAYER DCA HB A1C WAIVED: HB A1C (BAYER DCA - WAIVED): 7.5 % — ABNORMAL HIGH (ref <7.0)

## 2016-06-19 MED ORDER — EMPAGLIFLOZIN 25 MG PO TABS: 25.0000 mg | ORAL_TABLET | Freq: Every day | ORAL | 5 refills | Status: DC

## 2016-06-19 MED ORDER — EMPAGLIFLOZIN 25 MG PO TABS: 25.0000 mg | ORAL_TABLET | Freq: Every day | ORAL | 2 refills | Status: DC

## 2016-06-19 NOTE — Assessment & Plan Note (Signed)
The current medical regimen is effective;  continue present plan and medications.  

## 2016-06-19 NOTE — Progress Notes (Signed)
BP 138/76 (BP Location: Left Arm, Patient Position: Sitting, Cuff Size: Normal)   Pulse 63   Temp 98.2 F (36.8 C)   Wt 200 lb 12.8 oz (91.1 kg)   SpO2 99%   BMI 28.81 kg/m    Subjective:    Patient ID: Troy Christensen, male    DOB: 25-Mar-1951, 65 y.o.   MRN: [id number]  HPI: Troy Christensen is a 65 y.o. male  Chief Complaint  Patient presents with  . Follow-up   Patient doing well no complaints no gout symptoms taking allopurinol without problems. Diabetes doing well no complaints from medications taken faithfully Cholesterol doing well no concerns or problems.  Patient hadn't right knee replacement surgery done 2 years ago now his knees gotten stiff can't bend to 90 angle. Is going to need physical therapy. 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 Relevant past medical, surgical, family and social history reviewed and updated as indicated. Interim medical history since our last visit reviewed. Allergies and medications reviewed and updated.  Review of Systems  Constitutional: Negative.   Respiratory: Negative.   Cardiovascular: Negative.     Per HPI unless specifically indicated above     Objective:    BP 138/76 (BP Location: Left Arm, Patient Position: Sitting, Cuff Size: Normal)   Pulse 63   Temp 98.2 F (36.8 C)   Wt 200 lb 12.8 oz (91.1 kg)   SpO2 99%   BMI 28.81 kg/m   Wt Readings from Last 3 Encounters:  06/19/16 200 lb 12.8 oz (91.1 kg)  05/15/16 203 lb 2.5 oz (92.1 kg)  03/06/16 199 lb (90.3 kg)    Physical Exam  Constitutional: He is oriented to person, place, and time. He appears well-developed and well-nourished. No distress.  HENT:  Head: Normocephalic and atraumatic.  Right Ear: Hearing normal.  Left Ear: Hearing normal.  Nose: Nose normal.  Eyes: Conjunctivae and lids are normal. Right eye exhibits no discharge. Left eye exhibits no  discharge. No scleral icterus.  Cardiovascular: Normal rate, regular rhythm and normal heart sounds.   Pulmonary/Chest: Effort normal and breath sounds normal. No respiratory distress.  Musculoskeletal: Normal range of motion.  Neurological: He is alert and oriented to person, place, and time.  Skin: Skin is intact. No rash noted.  Psychiatric: He has a normal mood and affect. His speech is normal and behavior is normal. Judgment and thought content normal. Cognition and memory are normal.    Results for orders placed or performed in visit on 05/09/16  PSA  Result Value Ref Range   PSA 0.04 0.00 - 4.00 ng/mL      Assessment & Plan:   Problem List Items Addressed This Visit      Cardiovascular and Mediastinum   Hypertension    The current medical regimen is effective;  continue present plan and medications.       Relevant Orders   Basic metabolic panel     Other   Hyperlipidemia    The current medical regimen is effective;  continue present plan and medications.       Relevant Orders   Basic metabolic panel   LP+ALT+AST Piccolo, Waived   Gout    The current medical regimen is effective;  continue present plan and medications.       Relevant Orders   Uric acid    Other Visit Diagnoses  Diabetes mellitus without complication ([place])    -  Primary   Relevant Medications   empagliflozin (JARDIANCE) 25 MG TABS tablet   empagliflozin (JARDIANCE) 25 MG TABS tablet   Other Relevant Orders   Bayer DCA Hb A1c Waived       Follow up plan: Return in about 3 months (around 09/19/2016) for Physical Exam, Hemoglobin A1c.

## 2016-06-20 LAB — BASIC METABOLIC PANEL
BUN/Creatinine Ratio: 16 (ref 10–24)
BUN: 19 mg/dL (ref 8–27)
CALCIUM: 8.7 mg/dL (ref 8.6–10.2)
CHLORIDE: 99 mmol/L (ref 96–106)
CO2: 25 mmol/L (ref 18–29)
Creatinine, Ser: 1.16 mg/dL (ref 0.76–1.27)
GFR calc non Af Amer: 66 mL/min/{1.73_m2} (ref >59)
GFR, EST AFRICAN AMERICAN: 77 mL/min/{1.73_m2} (ref >59)
GLUCOSE: 136 mg/dL — AB (ref 65–99)
Potassium: 4.9 mmol/L (ref 3.5–5.2)
Sodium: 141 mmol/L (ref 134–144)

## 2016-06-20 LAB — URIC ACID: URIC ACID: 6.6 mg/dL (ref 3.7–8.6)

## 2016-06-23 ENCOUNTER — Encounter: Payer: Self-pay | Admitting: Family Medicine

## 2016-07-21 ENCOUNTER — Ambulatory Visit (INDEPENDENT_AMBULATORY_CARE_PROVIDER_SITE_OTHER): Payer: Medicare Other

## 2016-07-21 DIAGNOSIS — Z808 Family history of malignant neoplasm of other organs or systems: Secondary | ICD-10-CM | POA: Diagnosis not present

## 2016-07-21 DIAGNOSIS — D1800 Hemangioma unspecified site: Secondary | ICD-10-CM | POA: Diagnosis not present

## 2016-07-21 DIAGNOSIS — L812 Freckles: Secondary | ICD-10-CM | POA: Diagnosis not present

## 2016-07-21 DIAGNOSIS — Z23 Encounter for immunization: Secondary | ICD-10-CM | POA: Diagnosis not present

## 2016-07-21 DIAGNOSIS — L578 Other skin changes due to chronic exposure to nonionizing radiation: Secondary | ICD-10-CM | POA: Diagnosis not present

## 2016-07-21 DIAGNOSIS — L570 Actinic keratosis: Secondary | ICD-10-CM | POA: Diagnosis not present

## 2016-07-21 DIAGNOSIS — D229 Melanocytic nevi, unspecified: Secondary | ICD-10-CM | POA: Diagnosis not present

## 2016-07-21 DIAGNOSIS — Z1283 Encounter for screening for malignant neoplasm of skin: Secondary | ICD-10-CM | POA: Diagnosis not present

## 2016-08-14 ENCOUNTER — Telehealth: Payer: Self-pay | Admitting: Family Medicine

## 2016-08-14 DIAGNOSIS — H524 Presbyopia: Secondary | ICD-10-CM | POA: Diagnosis not present

## 2016-08-14 DIAGNOSIS — H25813 Combined forms of age-related cataract, bilateral: Secondary | ICD-10-CM | POA: Diagnosis not present

## 2016-08-14 DIAGNOSIS — H401132 Primary open-angle glaucoma, bilateral, moderate stage: Secondary | ICD-10-CM | POA: Diagnosis not present

## 2016-08-14 NOTE — Telephone Encounter (Signed)
Pt came by stated that he received a letter from his insurance company stating they would not cover his Jardiance. Can something different be sent to the pharmacy for the patient. Pharm is [facility]. Thanks. Pt however has already received a 90 day supply of Jardiance.

## 2016-08-14 NOTE — Telephone Encounter (Signed)
Routing to provider  

## 2016-08-18 MED ORDER — DAPAGLIFLOZIN PROPANEDIOL 10 MG PO TABS: 10.0000 mg | ORAL_TABLET | Freq: Every day | ORAL | 3 refills | Status: DC

## 2016-08-21 ENCOUNTER — Ambulatory Visit: Payer: Medicare Other | Admitting: Family Medicine

## 2016-08-21 DIAGNOSIS — G5602 Carpal tunnel syndrome, left upper limb: Secondary | ICD-10-CM | POA: Diagnosis not present

## 2016-08-21 DIAGNOSIS — M65342 Trigger finger, left ring finger: Secondary | ICD-10-CM | POA: Diagnosis not present

## 2016-09-16 ENCOUNTER — Encounter: Payer: Self-pay | Admitting: Family Medicine

## 2016-09-16 ENCOUNTER — Ambulatory Visit (INDEPENDENT_AMBULATORY_CARE_PROVIDER_SITE_OTHER): Payer: Medicare Other | Admitting: Family Medicine

## 2016-09-16 VITALS — BP 131/77 | HR 77 | Temp 97.4°F | Wt 194.8 lb

## 2016-09-16 DIAGNOSIS — J209 Acute bronchitis, unspecified: Secondary | ICD-10-CM | POA: Diagnosis not present

## 2016-09-16 MED ORDER — AZITHROMYCIN 250 MG PO TABS: ORAL_TABLET | ORAL | 0 refills | Status: DC

## 2016-09-16 MED ORDER — BENZONATATE 200 MG PO CAPS: 200.0000 mg | ORAL_CAPSULE | Freq: Two times a day (BID) | ORAL | 0 refills | Status: DC | PRN

## 2016-09-16 MED ORDER — HYDROCOD POLST-CPM POLST ER 10-8 MG/5ML PO SUER: 5.0000 mL | Freq: Every evening | ORAL | 0 refills | Status: DC | PRN

## 2016-09-16 MED ORDER — ALBUTEROL SULFATE (2.5 MG/3ML) 0.083% IN NEBU: 2.5000 mg | INHALATION_SOLUTION | Freq: Once | RESPIRATORY_TRACT | Status: DC

## 2016-09-16 MED ORDER — PREDNISONE 10 MG PO TABS: ORAL_TABLET | ORAL | 0 refills | Status: DC

## 2016-09-16 NOTE — Progress Notes (Signed)
BP 131/77 (BP Location: Left Arm, Patient Position: Sitting, Cuff Size: Large)   Pulse 77   Temp 97.4 F (36.3 C)   Wt 194 lb 12.8 oz (88.4 kg)   SpO2 99%   BMI 27.95 kg/m    Subjective:    Patient ID: Troy Christensen, male    DOB: [birth year], 66 y.o.   MRN: [id number]  HPI: Troy Christensen is a 66 y.o. male  Chief Complaint  Patient presents with  . URI    X 5 days, severe cough, nasal congestion and headache.    UPPER RESPIRATORY TRACT INFECTION Duration: 5 days Worst symptom: cough Fever: no Cough: yes Shortness of breath: yes Wheezing: no Chest pain: yes, with cough Chest tightness: no Chest congestion: no Nasal congestion: yes Runny nose: yes Post nasal drip: yes Sneezing: no Sore throat: yes Swollen glands: no Sinus pressure: yes Headache: yes Face pain: no Toothache: no Ear pain: no  Ear pressure: no  Eyes red/itching:no Eye drainage/crusting: no  Vomiting: no Rash: no Fatigue: yes Sick contacts: yes Strep contacts: no  Context: worse Recurrent sinusitis: no Relief with OTC cold/cough medications: no  Treatments attempted: cold/sinus, mucinex and cough syrup   Relevant past medical, surgical, family and social history reviewed and updated as indicated. Interim medical history since our last visit reviewed. Allergies and medications reviewed and updated.  Review of Systems  Constitutional: Positive for fatigue. Negative for activity change, appetite change, chills, diaphoresis, fever and unexpected weight change.  HENT: Positive for congestion, postnasal drip, rhinorrhea, sinus pressure and sore throat. Negative for dental problem, drooling, ear discharge, ear pain, facial swelling, hearing loss, mouth sores, nosebleeds, sinus pain, sneezing, tinnitus, trouble swallowing and voice change.   Respiratory: Positive for cough, chest tightness and shortness of breath. Negative for apnea, choking, wheezing and stridor.   Psychiatric/Behavioral:  Negative.     Per HPI unless specifically indicated above     Objective:    BP 131/77 (BP Location: Left Arm, Patient Position: Sitting, Cuff Size: Large)   Pulse 77   Temp 97.4 F (36.3 C)   Wt 194 lb 12.8 oz (88.4 kg)   SpO2 99%   BMI 27.95 kg/m   Wt Readings from Last 3 Encounters:  09/16/16 194 lb 12.8 oz (88.4 kg)  06/19/16 200 lb 12.8 oz (91.1 kg)  05/15/16 203 lb 2.5 oz (92.1 kg)    Physical Exam  Constitutional: He is oriented to person, place, and time. He appears well-developed and well-nourished. No distress.  HENT:  Head: Normocephalic and atraumatic.  Right Ear: Hearing and external ear normal.  Left Ear: Hearing and external ear normal.  Nose: Nose normal.  Mouth/Throat: Oropharynx is clear and moist. No oropharyngeal exudate.  Eyes: Conjunctivae, EOM and lids are normal. Pupils are equal, round, and reactive to light. Right eye exhibits no discharge. Left eye exhibits no discharge. No scleral icterus.  Neck: Normal range of motion. Neck supple. No JVD present. No tracheal deviation present. No thyromegaly present.  Cardiovascular: Normal rate, regular rhythm, normal heart sounds and intact distal pulses.  Exam reveals no gallop and no friction rub.   No murmur heard. Pulmonary/Chest: Effort normal. No stridor. No respiratory distress. He has wheezes in the right upper field, the right middle field, the right lower field, the left upper field, the left middle field and the left lower field. He has rhonchi in the right upper field, the right middle field, the right lower field, the left upper field,  the left middle field and the left lower field. He has no rales. He exhibits no tenderness.  Musculoskeletal: Normal range of motion.  Lymphadenopathy:    He has no cervical adenopathy.  Neurological: He is alert and oriented to person, place, and time.  Skin: Skin is intact. No rash noted. He is not diaphoretic.  Psychiatric: He has a normal mood and affect. His speech  is normal and behavior is normal. Judgment and thought content normal. Cognition and memory are normal.  Nursing note and vitals reviewed.   Results for orders placed or performed in visit on [id number]  Basic metabolic panel  Result Value Ref Range   Glucose 136 (H) 65 - 99 mg/dL   BUN 19 8 - 27 mg/dL   Creatinine, Ser 1.16 0.76 - 1.27 mg/dL   GFR calc non Af Amer 66 >59 mL/min/1.73   GFR calc Af Amer 77 >59 mL/min/1.73   BUN/Creatinine Ratio 16 10 - 24   Sodium 141 134 - 144 mmol/L   Potassium 4.9 3.5 - 5.2 mmol/L   Chloride 99 96 - 106 mmol/L   CO2 25 18 - 29 mmol/L   Calcium 8.7 8.6 - 10.2 mg/dL  Bayer DCA Hb A1c Waived  Result Value Ref Range   Bayer DCA Hb A1c Waived 7.5 (H) <7.0 %  LP+ALT+AST Piccolo, Waived  Result Value Ref Range   ALT (SGPT) Piccolo, Waived 30 10 - 47 U/L   AST (SGOT) Piccolo, Waived 28 11 - 38 U/L   Cholesterol Piccolo, Waived 165 <200 mg/dL   HDL Chol Piccolo, Waived 57 (L) >59 mg/dL   Triglycerides Piccolo,Waived 201 (H) <150 mg/dL   Chol/HDL Ratio Piccolo,Waive 2.9 mg/dL   LDL Chol Calc Piccolo Waived 68 <100 mg/dL   VLDL Chol Calc Piccolo,Waive 40 (H) <30 mg/dL  Uric acid  Result Value Ref Range   Uric Acid 6.6 3.7 - 8.6 mg/dL      Assessment & Plan:   Problem List Items Addressed This Visit    None    Visit Diagnoses    Acute bronchitis, unspecified organism    -  Primary   Slightly better following neb. Will treat with prednisone, azithromycin, tussionex and tessalon perles for comfort. Recheck lungs at follow up next week.    Relevant Medications   albuterol (PROVENTIL) (2.5 MG/3ML) 0.083% nebulizer solution 2.5 mg (Start on 09/16/2016 10:15 AM)       Follow up plan: Return As scheduled.

## 2016-09-24 ENCOUNTER — Encounter: Payer: Self-pay | Admitting: Family Medicine

## 2016-09-24 ENCOUNTER — Ambulatory Visit (INDEPENDENT_AMBULATORY_CARE_PROVIDER_SITE_OTHER): Payer: Medicare Other | Admitting: Family Medicine

## 2016-09-24 VITALS — BP 132/68 | HR 62 | Temp 97.5°F | Ht 69.8 in | Wt 194.0 lb

## 2016-09-24 DIAGNOSIS — M1A1790 Lead-induced chronic gout, unspecified ankle and foot, without tophus (tophi): Secondary | ICD-10-CM

## 2016-09-24 DIAGNOSIS — E119 Type 2 diabetes mellitus without complications: Secondary | ICD-10-CM | POA: Diagnosis not present

## 2016-09-24 DIAGNOSIS — Z0000 Encounter for general adult medical examination without abnormal findings: Secondary | ICD-10-CM

## 2016-09-24 DIAGNOSIS — T560X1D Toxic effect of lead and its compounds, accidental (unintentional), subsequent encounter: Secondary | ICD-10-CM

## 2016-09-24 DIAGNOSIS — Z23 Encounter for immunization: Secondary | ICD-10-CM | POA: Diagnosis not present

## 2016-09-24 DIAGNOSIS — E7800 Pure hypercholesterolemia, unspecified: Secondary | ICD-10-CM | POA: Diagnosis not present

## 2016-09-24 DIAGNOSIS — I10 Essential (primary) hypertension: Secondary | ICD-10-CM

## 2016-09-24 DIAGNOSIS — E785 Hyperlipidemia, unspecified: Secondary | ICD-10-CM | POA: Diagnosis not present

## 2016-09-24 DIAGNOSIS — Z8546 Personal history of malignant neoplasm of prostate: Secondary | ICD-10-CM

## 2016-09-24 LAB — URINALYSIS, ROUTINE W REFLEX MICROSCOPIC
Bilirubin, UA: NEGATIVE
Ketones, UA: NEGATIVE
Leukocytes, UA: NEGATIVE
NITRITE UA: NEGATIVE
PH UA: 5 (ref 5.0–7.5)
PROTEIN UA: NEGATIVE
RBC, UA: NEGATIVE
Specific Gravity, UA: 1.015 (ref 1.005–1.030)
UUROB: 0.2 mg/dL (ref 0.2–1.0)

## 2016-09-24 LAB — MICROSCOPIC EXAMINATION
BACTERIA UA: NONE SEEN
EPITHELIAL CELLS (NON RENAL): NONE SEEN /HPF (ref 0–10)
RBC MICROSCOPIC, UA: NONE SEEN /HPF (ref 0–?)
WBC UA: NONE SEEN /HPF (ref 0–?)

## 2016-09-24 LAB — BAYER DCA HB A1C WAIVED: HB A1C (BAYER DCA - WAIVED): 7.8 % — ABNORMAL HIGH (ref <7.0)

## 2016-09-24 MED ORDER — AMLODIPINE BESY-BENAZEPRIL HCL 5-20 MG PO CAPS: ORAL_CAPSULE | ORAL | 4 refills | Status: DC

## 2016-09-24 MED ORDER — ALLOPURINOL 100 MG PO TABS: 100.0000 mg | ORAL_TABLET | Freq: Every day | ORAL | 4 refills | Status: DC

## 2016-09-24 MED ORDER — PIOGLITAZONE HCL 45 MG PO TABS: 45.0000 mg | ORAL_TABLET | Freq: Every day | ORAL | 4 refills | Status: DC

## 2016-09-24 MED ORDER — CANAGLIFLOZIN 300 MG PO TABS: 300.0000 mg | ORAL_TABLET | Freq: Every day | ORAL | 1 refills | Status: DC

## 2016-09-24 MED ORDER — SITAGLIPTIN PHOSPHATE 100 MG PO TABS: 100.0000 mg | ORAL_TABLET | Freq: Every day | ORAL | 4 refills | Status: DC

## 2016-09-24 MED ORDER — LOVASTATIN 40 MG PO TABS: 40.0000 mg | ORAL_TABLET | Freq: Every day | ORAL | 4 refills | Status: DC

## 2016-09-24 MED ORDER — METFORMIN HCL ER 500 MG PO TB24: 1000.0000 mg | ORAL_TABLET | Freq: Two times a day (BID) | ORAL | 4 refills | Status: DC

## 2016-09-24 NOTE — Patient Instructions (Signed)
Pneumococcal Conjugate Vaccine (PCV13) What You Need to Know 1. Why get vaccinated? Vaccination can protect both children and adults from pneumococcal disease. Pneumococcal disease is caused by bacteria that can spread from person to person through close contact. It can cause ear infections, and it can also lead to more serious infections of the:  Lungs (pneumonia),  Blood (bacteremia), and  Covering of the brain and spinal cord (meningitis).  Pneumococcal pneumonia is most common among adults. Pneumococcal meningitis can cause deafness and brain damage, and it kills about 1 child in 10 who get it. Anyone can get pneumococcal disease, but children under 2 years of age and adults 65 years and older, people with certain medical conditions, and cigarette smokers are at the highest risk. Before there was a vaccine, the [place] saw:  more than 700 cases of meningitis,  about 13,000 blood infections,  about 5 million ear infections, and  about 200 deaths  in children under 5 each year from pneumococcal disease. Since vaccine became available, severe pneumococcal disease in these children has fallen by 88%. About 18,000 older adults die of pneumococcal disease each year in the [place]. Treatment of pneumococcal infections with penicillin and other drugs is not as effective as it used to be, because some strains of the disease have become resistant to these drugs. This makes prevention of the disease, through vaccination, even more important. 2. PCV13 vaccine Pneumococcal conjugate vaccine (called PCV13) protects against 13 types of pneumococcal bacteria. PCV13 is routinely given to children at 2, 4, 6, and 12-15 months of age. It is also recommended for children and adults 2 to 64 years of age with certain health conditions, and for all adults 65 years of age and older. Your doctor can give you details. 3. Some people should not get this vaccine Anyone who has ever had a  life-threatening allergic reaction to a dose of this vaccine, to an earlier pneumococcal vaccine called PCV7, or to any vaccine containing diphtheria toxoid (for example, DTaP), should not get PCV13. Anyone with a severe allergy to any component of PCV13 should not get the vaccine. Tell your doctor if the person being vaccinated has any severe allergies. If the person scheduled for vaccination is not feeling well, your healthcare provider might decide to reschedule the shot on another day. 4. Risks of a vaccine reaction With any medicine, including vaccines, there is a chance of reactions. These are usually mild and go away on their own, but serious reactions are also possible. Problems reported following PCV13 varied by age and dose in the series. The most common problems reported among children were:  About half became drowsy after the shot, had a temporary loss of appetite, or had redness or tenderness where the shot was given.  About 1 out of 3 had swelling where the shot was given.  About 1 out of 3 had a mild fever, and about 1 in 20 had a fever over 102.2F.  Up to about 8 out of 10 became fussy or irritable.  Adults have reported pain, redness, and swelling where the shot was given; also mild fever, fatigue, headache, chills, or muscle pain. Young children who get PCV13 along with inactivated flu vaccine at the same time may be at increased risk for seizures caused by fever. Ask your doctor for more information. Problems that could happen after any vaccine:  People sometimes faint after a medical procedure, including vaccination. Sitting or lying down for about 15 minutes can help prevent   fainting, and injuries caused by a fall. Tell your doctor if you feel dizzy, or have vision changes or ringing in the ears.  Some older children and adults get severe pain in the shoulder and have difficulty moving the arm where a shot was given. This happens very rarely.  Any medication can cause a  severe allergic reaction. Such reactions from a vaccine are very rare, estimated at about 1 in a million doses, and would happen within a few minutes to a few hours after the vaccination. As with any medicine, there is a very small chance of a vaccine causing a serious injury or death. The safety of vaccines is always being monitored. For more information, visit: www.cdc.gov/vaccinesafety/ 5. What if there is a serious reaction? What should I look for? Look for anything that concerns you, such as signs of a severe allergic reaction, very high fever, or unusual behavior. Signs of a severe allergic reaction can include hives, swelling of the face and throat, difficulty breathing, a fast heartbeat, dizziness, and weakness-usually within a few minutes to a few hours after the vaccination. What should I do?  If you think it is a severe allergic reaction or other emergency that can't wait, call 9-1-1 or get the person to the nearest hospital. Otherwise, call your doctor.  Reactions should be reported to the Vaccine Adverse Event Reporting System (VAERS). Your doctor should file this report, or you can do it yourself through the VAERS web site at www.vaers.hhs.gov, or by calling 1-800-822-7967. ? VAERS does not give medical advice. 6. The National Vaccine Injury Compensation Program The National Vaccine Injury Compensation Program (VICP) is a federal program that was created to compensate people who may have been injured by certain vaccines. Persons who believe they may have been injured by a vaccine can learn about the program and about filing a claim by calling 1-800-338-2382 or visiting the VICP website at www.hrsa.gov/vaccinecompensation. There is a time limit to file a claim for compensation. 7. How can I learn more?  Ask your healthcare provider. He or she can give you the vaccine package insert or suggest other sources of information.  Call your local or state health department.  Contact the  Centers for Disease Control and Prevention (CDC): ? Call 1-800-232-4636 (1-800-CDC-INFO) or ? Visit CDC's website at www.cdc.gov/vaccines Vaccine Information Statement, PCV13 Vaccine (07/20/2014) This information is not intended to replace advice given to you by your health care provider. Make sure you discuss any questions you have with your health care provider. Document Released: 06/29/2006 Document Revised: 05/22/2016 Document Reviewed: 05/22/2016 Elsevier Interactive Patient Education  2017 Elsevier Inc.  

## 2016-09-24 NOTE — Assessment & Plan Note (Signed)
Doing well 

## 2016-09-24 NOTE — Assessment & Plan Note (Signed)
The current medical regimen is effective;  continue present plan and medications.  

## 2016-09-24 NOTE — Assessment & Plan Note (Addendum)
With A1c rising instead of taking Jardiance last 90 days will take what patient has which is now for cigarette has 90 days worth will take that and add Januvia and recheck A1c in about 3 months or so.

## 2016-09-24 NOTE — Progress Notes (Signed)
BP 132/68 (BP Location: Left Arm)   Pulse 62   Temp 97.5 F (36.4 C)   Ht 5' 9.8" (1.773 m)   Wt 194 lb (88 kg)   SpO2 97%   BMI 28.00 kg/m    Subjective:    Patient ID: Troy Christensen, male    DOB: 12-09-1950, 66 y.o.   MRN: [id number]  HPI: Troy Christensen is a 66 y.o. male  Chief Complaint  Patient presents with  . Medicare Wellness  Welcome to medicare AWV metrics metrics met Patient all in all doing well cold from earlier is much better and resolving. Patient having some insurance issues with diabetes medicines for cigarette was costing too much will try Invokanna now. Patient will check with insurance company. Diabetes does feel better. Prostate being followed by Dr. Donella Stade and is been doing well with no further symptoms not taking tamsulosin. Cholesterol blood pressure doing well.   Relevant past medical, surgical, family and social history reviewed and updated as indicated. Interim medical history since our last visit reviewed. Allergies and medications reviewed and updated.  Review of Systems  Constitutional: Negative.   HENT: Negative.   Eyes: Negative.   Respiratory: Negative.   Cardiovascular: Negative.   Gastrointestinal: Negative.   Endocrine: Negative.   Genitourinary: Negative.   Musculoskeletal: Negative.   Skin: Negative.   Allergic/Immunologic: Negative.   Neurological: Negative.   Hematological: Negative.   Psychiatric/Behavioral: Negative.     Per HPI unless specifically indicated above     Objective:    BP 132/68 (BP Location: Left Arm)   Pulse 62   Temp 97.5 F (36.4 C)   Ht 5' 9.8" (1.773 m)   Wt 194 lb (88 kg)   SpO2 97%   BMI 28.00 kg/m   Wt Readings from Last 3 Encounters:  09/24/16 194 lb (88 kg)  09/16/16 194 lb 12.8 oz (88.4 kg)  06/19/16 200 lb 12.8 oz (91.1 kg)    Physical Exam  Constitutional: He is oriented to person, place, and time. He appears well-developed and well-nourished.  HENT:  Head:  Normocephalic.  Right Ear: External ear normal.  Left Ear: External ear normal.  Nose: Nose normal.  Eyes: Conjunctivae and EOM are normal. Pupils are equal, round, and reactive to light.  Neck: Normal range of motion. Neck supple. No thyromegaly present.  Cardiovascular: Normal rate, regular rhythm, normal heart sounds and intact distal pulses.   Pulmonary/Chest: Effort normal and breath sounds normal.  Abdominal: Soft. Bowel sounds are normal. There is no splenomegaly or hepatomegaly.  Genitourinary:  Genitourinary Comments: Done at urology  Musculoskeletal: Normal range of motion.  Lymphadenopathy:    He has no cervical adenopathy.  Neurological: He is alert and oriented to person, place, and time. He has normal reflexes.  Skin: Skin is warm and dry.  Psychiatric: He has a normal mood and affect. His behavior is normal. Judgment and thought content normal.    Results for orders placed or performed in visit on 96/28/36  Basic metabolic panel  Result Value Ref Range   Glucose 136 (H) 65 - 99 mg/dL   BUN 19 8 - 27 mg/dL   Creatinine, Ser 1.16 0.76 - 1.27 mg/dL   GFR calc non Af Amer 66 >59 mL/min/1.73   GFR calc Af Amer 77 >59 mL/min/1.73   BUN/Creatinine Ratio 16 10 - 24   Sodium 141 134 - 144 mmol/L   Potassium 4.9 3.5 - 5.2 mmol/L   Chloride 99 96 -  106 mmol/L   CO2 25 18 - 29 mmol/L   Calcium 8.7 8.6 - 10.2 mg/dL  Bayer DCA Hb A1c Waived  Result Value Ref Range   Bayer DCA Hb A1c Waived 7.5 (H) <7.0 %  LP+ALT+AST Piccolo, Waived  Result Value Ref Range   ALT (SGPT) Piccolo, Waived 30 10 - 47 U/L   AST (SGOT) Piccolo, Waived 28 11 - 38 U/L   Cholesterol Piccolo, Waived 165 <200 mg/dL   HDL Chol Piccolo, Waived 57 (L) >59 mg/dL   Triglycerides Piccolo,Waived 201 (H) <150 mg/dL   Chol/HDL Ratio Piccolo,Waive 2.9 mg/dL   LDL Chol Calc Piccolo Waived 68 <100 mg/dL   VLDL Chol Calc Piccolo,Waive 40 (H) <30 mg/dL  Uric acid  Result Value Ref Range   Uric Acid 6.6 3.7 -  8.6 mg/dL      Assessment & Plan:   Problem List Items Addressed This Visit      Cardiovascular and Mediastinum   Hypertension    The current medical regimen is effective;  continue present plan and medications.       Relevant Medications   lovastatin (MEVACOR) 40 MG tablet   amLODipine-benazepril (LOTREL) 5-20 MG capsule     Endocrine   Diabetes mellitus ([place])    With A1c rising instead of taking Jardiance last 90 days will take what patient has which is now for cigarette has 90 days worth will take that and add Januvia and recheck A1c in about 3 months or so.      Relevant Medications   canagliflozin (INVOKANA) 300 MG TABS tablet   pioglitazone (ACTOS) 45 MG tablet   metFORMIN (GLUCOPHAGE-XR) 500 MG 24 hr tablet   lovastatin (MEVACOR) 40 MG tablet   amLODipine-benazepril (LOTREL) 5-20 MG capsule   sitaGLIPtin (JANUVIA) 100 MG tablet   Other Relevant Orders   Bayer DCA Hb A1c Waived   Comprehensive metabolic panel   TSH   Urinalysis, Routine w reflex microscopic     Other   Hyperlipidemia    The current medical regimen is effective;  continue present plan and medications.       Relevant Medications   lovastatin (MEVACOR) 40 MG tablet   amLODipine-benazepril (LOTREL) 5-20 MG capsule   Other Relevant Orders   Lipid panel   TSH   History of prostate cancer    Doing well      Relevant Orders   PSA   Gout    The current medical regimen is effective;  continue present plan and medications.       Relevant Medications   allopurinol (ZYLOPRIM) 100 MG tablet    Other Visit Diagnoses    Need for pneumococcal vaccination    -  Primary   Relevant Orders   Pneumococcal conjugate vaccine 13-valent IM (Completed)   Initial Medicare annual wellness visit       Relevant Orders   EKG 12-Lead (Completed)       Follow up plan: Return in about 3 months (around 12/23/2016) for Hemoglobin A1c.

## 2016-09-25 ENCOUNTER — Encounter: Payer: Self-pay | Admitting: Family Medicine

## 2016-09-25 LAB — COMPREHENSIVE METABOLIC PANEL
A/G RATIO: 2.1 (ref 1.2–2.2)
ALBUMIN: 4.4 g/dL (ref 3.6–4.8)
ALK PHOS: 67 IU/L (ref 39–117)
ALT: 21 IU/L (ref 0–44)
AST: 14 IU/L (ref 0–40)
BUN / CREAT RATIO: 18 (ref 10–24)
BUN: 21 mg/dL (ref 8–27)
CHLORIDE: 99 mmol/L (ref 96–106)
CO2: 25 mmol/L (ref 18–29)
Calcium: 9.7 mg/dL (ref 8.6–10.2)
Creatinine, Ser: 1.17 mg/dL (ref 0.76–1.27)
GFR calc Af Amer: 75 mL/min/{1.73_m2} (ref >59)
GFR calc non Af Amer: 65 mL/min/{1.73_m2} (ref >59)
GLUCOSE: 121 mg/dL — AB (ref 65–99)
Globulin, Total: 2.1 g/dL (ref 1.5–4.5)
POTASSIUM: 4.7 mmol/L (ref 3.5–5.2)
SODIUM: 141 mmol/L (ref 134–144)
Total Protein: 6.5 g/dL (ref 6.0–8.5)

## 2016-09-25 LAB — LIPID PANEL
CHOLESTEROL TOTAL: 178 mg/dL (ref 100–199)
Chol/HDL Ratio: 3.9 ratio units (ref 0.0–5.0)
HDL: 46 mg/dL (ref >39)
LDL Calculated: 77 mg/dL (ref 0–99)
Triglycerides: 274 mg/dL — ABNORMAL HIGH (ref 0–149)
VLDL CHOLESTEROL CAL: 55 mg/dL — AB (ref 5–40)

## 2016-09-25 LAB — TSH: TSH: 1.66 u[IU]/mL (ref 0.450–4.500)

## 2016-09-25 LAB — PSA: Prostate Specific Ag, Serum: <0.1 ng/mL (ref 0.0–4.0)

## 2016-10-02 DIAGNOSIS — G5602 Carpal tunnel syndrome, left upper limb: Secondary | ICD-10-CM | POA: Diagnosis not present

## 2016-10-02 DIAGNOSIS — M65342 Trigger finger, left ring finger: Secondary | ICD-10-CM | POA: Diagnosis not present

## 2016-10-09 ENCOUNTER — Encounter: Payer: TRICARE For Life (TFL) | Admitting: Family Medicine

## 2016-10-14 ENCOUNTER — Telehealth: Payer: Self-pay | Admitting: Family Medicine

## 2016-10-14 NOTE — Telephone Encounter (Signed)
Patient stopped by the office wanting to let Dr Jeananne Rama know that his blood sugars over the past week or longer have been running under 110 and for the past few days under 100. At the last visit Dr Jeananne Rama added more medications for his sugars so he is wanting Dr Jeananne Rama to call him to discuss.  [phone]

## 2016-10-14 NOTE — Telephone Encounter (Signed)
Doing well on for see a instead of Invokanna Price was only $49 for 9 months and blood sugars been doing in the 80s and 90s fasting.

## 2016-10-14 NOTE — Telephone Encounter (Signed)
Call pt 

## 2016-10-14 NOTE — Telephone Encounter (Signed)
Please see message from front desk staff 

## 2016-10-20 DIAGNOSIS — M65342 Trigger finger, left ring finger: Secondary | ICD-10-CM | POA: Diagnosis not present

## 2016-10-20 DIAGNOSIS — G5602 Carpal tunnel syndrome, left upper limb: Secondary | ICD-10-CM | POA: Diagnosis not present

## 2016-11-17 ENCOUNTER — Encounter: Payer: Self-pay | Admitting: Family Medicine

## 2016-11-17 ENCOUNTER — Ambulatory Visit (INDEPENDENT_AMBULATORY_CARE_PROVIDER_SITE_OTHER): Payer: Medicare Other | Admitting: Family Medicine

## 2016-11-17 VITALS — BP 165/77 | HR 65 | Temp 98.3°F | Wt 199.0 lb

## 2016-11-17 DIAGNOSIS — M79605 Pain in left leg: Secondary | ICD-10-CM

## 2016-11-17 DIAGNOSIS — M25661 Stiffness of right knee, not elsewhere classified: Secondary | ICD-10-CM

## 2016-11-17 MED ORDER — PREDNISONE 20 MG PO TABS: 40.0000 mg | ORAL_TABLET | Freq: Every day | ORAL | 0 refills | Status: DC

## 2016-11-17 NOTE — Progress Notes (Signed)
BP (!) 165/77   Pulse 65   Temp 98.3 F (36.8 C)   Wt 199 lb (90.3 kg)   SpO2 98%   BMI 28.72 kg/m    Subjective:    Patient ID: Troy Christensen, male    DOB: Sep 29, 1950, 66 y.o.   MRN: [id number]  HPI: Troy Christensen is a 66 y.o. male  Chief Complaint  Patient presents with  . Leg Pain    x 1 week, left leg, side of his leg and calf. Walking doesn't hurt, but standing on it does. Feels similar to gout.    10 days of left leg pain in the lateral calf. Standing or sitting still seems to make it hurt worst. Laying or walking seems to make it better.No pain with direct pressure.  Describes the pain as a severe aching pain. Has tried some extra strength tylenol with minimal relief. Has had a similar spell several years ago and underwent workup to r/o DVT. Does not recall the end dx, but negative for clot at that time. Pt with hx of gout and feels this is related somehow. Still taking the allopurinol faithfully.   Relevant past medical, surgical, family and social history reviewed and updated as indicated. Interim medical history since our last visit reviewed. Allergies and medications reviewed and updated.  Review of Systems  Constitutional: Negative.   HENT: Negative.   Eyes: Negative.   Respiratory: Negative.  Negative for shortness of breath.   Cardiovascular: Negative for chest pain.  Genitourinary: Negative.   Musculoskeletal:       Left lower leg pain  Neurological: Negative.   Psychiatric/Behavioral: Negative.     Per HPI unless specifically indicated above     Objective:    BP (!) 165/77   Pulse 65   Temp 98.3 F (36.8 C)   Wt 199 lb (90.3 kg)   SpO2 98%   BMI 28.72 kg/m   Wt Readings from Last 3 Encounters:  11/17/16 199 lb (90.3 kg)  09/24/16 194 lb (88 kg)  09/16/16 194 lb 12.8 oz (88.4 kg)    Physical Exam  Constitutional: He is oriented to person, place, and time. He appears well-developed and well-nourished. No distress.  HENT:  Head:  Atraumatic.  Eyes: Conjunctivae are normal. Pupils are equal, round, and reactive to light.  Neck: Normal range of motion. Neck supple.  Cardiovascular: Normal rate, regular rhythm and normal heart sounds.   Pulmonary/Chest: Effort normal and breath sounds normal. No respiratory distress.  Musculoskeletal: Normal range of motion.  Minimal ttp in area of pain on lateral left calf. Some scant superficial edema in oblong distribution in area, non-fluctuant, no erythema or wound  Neurological: He is alert and oriented to person, place, and time.  Skin: Skin is warm and dry.  Psychiatric: He has a normal mood and affect. His behavior is normal.  Nursing note and vitals reviewed.     Assessment & Plan:   Problem List Items Addressed This Visit    None    Visit Diagnoses    Pain of left lower extremity    -  Primary   Relevant Orders   Uric acid   Joint stiffness of right lower leg       PT referral placed today per discussion previously with Dr. Jeananne Rama regarding leg stiffness s/p total knee replacement   Relevant Orders   Ambulatory referral to Physical Therapy    Discussed that this was very unlikely gout as the joints are  uninvolved but will get uric acid to make sure his allopurinol is still working well. Edema possibly lipomatous in nature, but difficult to tell. Could also represent muscle strain. Cannot r/o DVT at this point, but pt refusing imaging at this time. Will try some short course prednisone and see if any benefit. Pt will call with any CP, SOB, or worsening sxs in the area. Will order u/s if pt changes his mind regarding the DVT r/o.   Follow up plan: Return for as scheduled.

## 2016-11-17 NOTE — Patient Instructions (Signed)
Follow up as scheduled.  

## 2016-11-18 LAB — URIC ACID: Uric Acid: 4.9 mg/dL (ref 3.7–8.6)

## 2016-11-21 ENCOUNTER — Telehealth: Payer: Self-pay | Admitting: Family Medicine

## 2016-11-21 DIAGNOSIS — M79662 Pain in left lower leg: Secondary | ICD-10-CM

## 2016-11-21 NOTE — Telephone Encounter (Signed)
Pt called and stated that he would like to get an ultrasound and would like order placed.

## 2016-11-21 NOTE — Telephone Encounter (Signed)
Order placed

## 2016-11-21 NOTE — Telephone Encounter (Signed)
Routing to provider  

## 2016-11-24 ENCOUNTER — Telehealth: Payer: Self-pay | Admitting: Family Medicine

## 2016-11-24 DIAGNOSIS — M25669 Stiffness of unspecified knee, not elsewhere classified: Secondary | ICD-10-CM | POA: Diagnosis not present

## 2016-11-24 NOTE — Telephone Encounter (Signed)
Order refaxed to [facility].

## 2016-11-24 NOTE — Telephone Encounter (Signed)
Troy Christensen with [facility] called and would like an order for physical therapy faxed over to [phone]. Pt is there now but stated he didn't have the order.

## 2016-11-26 ENCOUNTER — Telehealth: Payer: Self-pay | Admitting: Family Medicine

## 2016-11-26 DIAGNOSIS — M79605 Pain in left leg: Secondary | ICD-10-CM

## 2016-11-26 NOTE — Telephone Encounter (Signed)
Pt calling about US Venous Img Lower Unilateral Left . That was placed 11/18/16. Pt calling to follow up as no one has contacted him to schedule. See no notes in referral.

## 2016-11-26 NOTE — Telephone Encounter (Signed)
These orders do not show up in referral WQ. I have to run an orders report. I have not ran an orders report yet.   Entered in referral to [facility] for U/S of lower left leg.  Marked high since patient is concerned.  They will call and schedule appt.

## 2016-11-26 NOTE — Telephone Encounter (Signed)
Patient called to check on the ultrasound Apolonio Schneiders had sent in for him, he has not heard anything about scheduling it and wants to make sure it is still in the works  Thanks

## 2016-11-27 ENCOUNTER — Telehealth: Payer: Self-pay

## 2016-11-27 DIAGNOSIS — M25669 Stiffness of unspecified knee, not elsewhere classified: Secondary | ICD-10-CM | POA: Diagnosis not present

## 2016-11-27 NOTE — Telephone Encounter (Signed)
[facility], [place],  [place]  [address] [address]irections Main: [phone]

## 2016-11-27 NOTE — Telephone Encounter (Signed)
Info relayed to patient.

## 2016-12-01 DIAGNOSIS — M25669 Stiffness of unspecified knee, not elsewhere classified: Secondary | ICD-10-CM | POA: Diagnosis not present

## 2016-12-04 ENCOUNTER — Ambulatory Visit
Admission: RE | Admit: 2016-12-04 | Discharge: 2016-12-04 | Disposition: A | Discharge status: Home / Self Care | Payer: Medicare Other | Source: Ambulatory Visit | Attending: Family Medicine | Admitting: Family Medicine

## 2016-12-04 DIAGNOSIS — M79662 Pain in left lower leg: Secondary | ICD-10-CM | POA: Insufficient documentation

## 2016-12-04 DIAGNOSIS — M25669 Stiffness of unspecified knee, not elsewhere classified: Secondary | ICD-10-CM | POA: Diagnosis not present

## 2016-12-04 DIAGNOSIS — M7989 Other specified soft tissue disorders: Secondary | ICD-10-CM | POA: Diagnosis not present

## 2016-12-05 ENCOUNTER — Telehealth: Payer: Self-pay | Admitting: Family Medicine

## 2016-12-05 DIAGNOSIS — L03115 Cellulitis of right lower limb: Secondary | ICD-10-CM

## 2016-12-05 NOTE — Telephone Encounter (Signed)
Patient notified

## 2016-12-05 NOTE — Telephone Encounter (Signed)
I had sent his results via mychart earlier today. U/S negative for DVT. I mentioned in the note that he should let us know if he's still having issues with the area so we can discuss next steps

## 2016-12-05 NOTE — Telephone Encounter (Signed)
Patient called to see if we had the results back from his ultrasound.  Please advise.   Thanks  [phone]

## 2016-12-05 NOTE — Telephone Encounter (Signed)
Routing to provider who ordered US.

## 2016-12-08 ENCOUNTER — Encounter: Payer: Self-pay | Admitting: Family Medicine

## 2016-12-08 ENCOUNTER — Ambulatory Visit (INDEPENDENT_AMBULATORY_CARE_PROVIDER_SITE_OTHER): Payer: Medicare Other | Admitting: Family Medicine

## 2016-12-08 DIAGNOSIS — L03115 Cellulitis of right lower limb: Secondary | ICD-10-CM

## 2016-12-08 DIAGNOSIS — M7989 Other specified soft tissue disorders: Secondary | ICD-10-CM | POA: Diagnosis not present

## 2016-12-08 DIAGNOSIS — M79662 Pain in left lower leg: Secondary | ICD-10-CM | POA: Diagnosis not present

## 2016-12-08 DIAGNOSIS — M25669 Stiffness of unspecified knee, not elsewhere classified: Secondary | ICD-10-CM | POA: Diagnosis not present

## 2016-12-08 LAB — CBC WITH DIFFERENTIAL/PLATELET
Hematocrit: 36.9 % — ABNORMAL LOW (ref 37.5–51.0)
Hemoglobin: 12.2 g/dL — ABNORMAL LOW (ref 13.0–17.7)
LYMPHS: 26 %
Lymphocytes Absolute: 1 10*3/uL (ref 0.7–3.1)
MCH: 30.8 pg (ref 26.6–33.0)
MCHC: 33.1 g/dL (ref 31.5–35.7)
MCV: 93 fL (ref 79–97)
MID (Absolute): 0.5 10*3/uL (ref 0.1–1.6)
MID: 13 %
NEUTROS ABS: 2.3 10*3/uL (ref 1.4–7.0)
Neutrophils: 62 %
PLATELETS: 178 10*3/uL (ref 150–379)
RBC: 3.96 x10E6/uL — AB (ref 4.14–5.80)
RDW: 14.8 % (ref 12.3–15.4)
WBC: 3.8 10*3/uL (ref 3.4–10.8)

## 2016-12-08 NOTE — Progress Notes (Signed)
   BP 122/70   Pulse 70   Resp 16   Wt 199 lb 12.8 oz (90.6 kg)   SpO2 97%   BMI 28.83 kg/m    Subjective:    Patient ID: Troy Christensen, male    DOB: Sep 18, 1950, 66 y.o.   MRN: [id number]  HPI: Troy Christensen is a 66 y.o. male  Chief Complaint  Patient presents with  . Follow-up  Patient with continued leg pain lateral lower leg with some slight swelling there is been ongoing for about a month and just not getting better reviewed labs which are normal and ultrasound which is negative for DVT. Patient with no known trauma irritation overuse. No complaints of redness or other skin surface changes. No radicular symptoms noted low back pain no other back symptoms or lifting bending issues. Relevant past medical, surgical, family and social history reviewed and updated as indicated. Interim medical history since our last visit reviewed. Allergies and medications reviewed and updated.  Review of Systems  Constitutional: Negative.   Respiratory: Negative.   Cardiovascular: Negative.     Per HPI unless specifically indicated above     Objective:    BP 122/70   Pulse 70   Resp 16   Wt 199 lb 12.8 oz (90.6 kg)   SpO2 97%   BMI 28.83 kg/m   Wt Readings from Last 3 Encounters:  12/08/16 199 lb 12.8 oz (90.6 kg)  11/17/16 199 lb (90.3 kg)  09/24/16 194 lb (88 kg)    Physical Exam  Constitutional: He is oriented to person, place, and time. He appears well-developed and well-nourished.  HENT:  Head: Normocephalic and atraumatic.  Eyes: Conjunctivae and EOM are normal.  Neck: Normal range of motion.  Cardiovascular: Normal rate, regular rhythm and normal heart sounds.   Pulmonary/Chest: Effort normal and breath sounds normal.  Musculoskeletal: Normal range of motion.  Leg no redness very mild swelling with some touchiness left lateral leg. No worsening symptoms or evidence of tendinitis with resistance to range of motion of both upper and lower legs and ankles.    Neurological: He is alert and oriented to person, place, and time.  Skin: No erythema.  Psychiatric: He has a normal mood and affect. His behavior is normal. Judgment and thought content normal.   CBC done here in the office with normal WBC count  Results for orders placed or performed in visit on 11/17/16  Uric acid  Result Value Ref Range   Uric Acid 4.9 3.7 - 8.6 mg/dL      Assessment & Plan:   Problem List Items Addressed This Visit      Other   Pain and swelling of left lower leg    Unknown cause of chronic worsening left lower lateral leg pain will refer to orthopedics to further evaluate.      Relevant Orders   Ambulatory referral to Orthopedic Surgery    Other Visit Diagnoses    Cellulitis of right lower extremity       No evidence of cellulitis on exam       Follow up plan: Return if symptoms worsen or fail to improve, for As scheduled.

## 2016-12-08 NOTE — Telephone Encounter (Signed)
Phone call Discussed with patient leg still bothersome swelling tender actually getting worse. Patient's been taking meloxicam without much help prednisone didn't do anything. Reviewed ultrasound negative for DVT. Because of worsening nature ongoing for over a month and leg swelling will have patient come in today will do CBC for possibility of infection and recheck may need orthopedic referral.

## 2016-12-08 NOTE — Telephone Encounter (Signed)
Spoke with patient Friday afternoon. He would like for Dr Jeananne Rama to try to get him in today to discuss the next step. He has no available slots but not sure if he would like for [place] to work him in.  Please advise.    Thank Dennis Bast Santiago Glad  [phone]

## 2016-12-08 NOTE — Addendum Note (Signed)
Addended byGolden Pop on: 12/08/2016 12:06 PM   Modules accepted: Orders

## 2016-12-08 NOTE — Assessment & Plan Note (Signed)
Unknown cause of chronic worsening left lower lateral leg pain will refer to orthopedics to further evaluate.

## 2016-12-08 NOTE — Telephone Encounter (Signed)
Patient coming in today after 1:00 please work in. Short visit with lab

## 2016-12-11 ENCOUNTER — Encounter (INDEPENDENT_AMBULATORY_CARE_PROVIDER_SITE_OTHER): Payer: Self-pay

## 2016-12-11 DIAGNOSIS — M25669 Stiffness of unspecified knee, not elsewhere classified: Secondary | ICD-10-CM | POA: Diagnosis not present

## 2016-12-15 DIAGNOSIS — M25561 Pain in right knee: Secondary | ICD-10-CM | POA: Diagnosis not present

## 2016-12-15 DIAGNOSIS — S86112A Strain of other muscle(s) and tendon(s) of posterior muscle group at lower leg level, left leg, initial encounter: Secondary | ICD-10-CM | POA: Diagnosis not present

## 2016-12-15 DIAGNOSIS — M25669 Stiffness of unspecified knee, not elsewhere classified: Secondary | ICD-10-CM | POA: Diagnosis not present

## 2016-12-18 DIAGNOSIS — M25669 Stiffness of unspecified knee, not elsewhere classified: Secondary | ICD-10-CM | POA: Diagnosis not present

## 2016-12-18 DIAGNOSIS — M1712 Unilateral primary osteoarthritis, left knee: Secondary | ICD-10-CM | POA: Diagnosis not present

## 2016-12-18 DIAGNOSIS — S86112A Strain of other muscle(s) and tendon(s) of posterior muscle group at lower leg level, left leg, initial encounter: Secondary | ICD-10-CM | POA: Diagnosis not present

## 2016-12-18 DIAGNOSIS — M25561 Pain in right knee: Secondary | ICD-10-CM | POA: Diagnosis not present

## 2016-12-18 DIAGNOSIS — M791 Myalgia: Secondary | ICD-10-CM | POA: Diagnosis not present

## 2016-12-22 DIAGNOSIS — M25669 Stiffness of unspecified knee, not elsewhere classified: Secondary | ICD-10-CM | POA: Diagnosis not present

## 2016-12-22 DIAGNOSIS — S86112A Strain of other muscle(s) and tendon(s) of posterior muscle group at lower leg level, left leg, initial encounter: Secondary | ICD-10-CM | POA: Diagnosis not present

## 2016-12-22 DIAGNOSIS — M25561 Pain in right knee: Secondary | ICD-10-CM | POA: Diagnosis not present

## 2016-12-25 ENCOUNTER — Ambulatory Visit: Payer: Medicare Other | Admitting: Family Medicine

## 2016-12-25 DIAGNOSIS — S86112A Strain of other muscle(s) and tendon(s) of posterior muscle group at lower leg level, left leg, initial encounter: Secondary | ICD-10-CM | POA: Diagnosis not present

## 2016-12-25 DIAGNOSIS — M25669 Stiffness of unspecified knee, not elsewhere classified: Secondary | ICD-10-CM | POA: Diagnosis not present

## 2016-12-25 DIAGNOSIS — M25561 Pain in right knee: Secondary | ICD-10-CM | POA: Diagnosis not present

## 2016-12-29 DIAGNOSIS — M25669 Stiffness of unspecified knee, not elsewhere classified: Secondary | ICD-10-CM | POA: Diagnosis not present

## 2016-12-29 DIAGNOSIS — S86112A Strain of other muscle(s) and tendon(s) of posterior muscle group at lower leg level, left leg, initial encounter: Secondary | ICD-10-CM | POA: Diagnosis not present

## 2016-12-29 DIAGNOSIS — M25561 Pain in right knee: Secondary | ICD-10-CM | POA: Diagnosis not present

## 2016-12-30 ENCOUNTER — Telehealth: Payer: Self-pay | Admitting: Family Medicine

## 2016-12-30 MED ORDER — PREDNISONE 10 MG PO TABS: ORAL_TABLET | ORAL | 0 refills | Status: DC

## 2016-12-30 NOTE — Telephone Encounter (Signed)
Troy Christensen could you give patient a call regarding needing something strong for I think he said poison ivy.    Thanks

## 2016-12-30 NOTE — Telephone Encounter (Signed)
Has been ongoing since yesterday, much worse today. Please advise.

## 2016-12-30 NOTE — Telephone Encounter (Signed)
Call pt 

## 2016-12-30 NOTE — Telephone Encounter (Signed)
Spoke with patient he needs something "Strong" for poison oak sent to [facility].

## 2016-12-30 NOTE — Telephone Encounter (Deleted)
[place] # [phone]

## 2016-12-30 NOTE — Telephone Encounter (Signed)
Phone call Discussed with patient poison ivy spread on arms chest abdomen and legs will call in prednisone

## 2017-01-01 DIAGNOSIS — M25669 Stiffness of unspecified knee, not elsewhere classified: Secondary | ICD-10-CM | POA: Diagnosis not present

## 2017-01-01 DIAGNOSIS — S86112A Strain of other muscle(s) and tendon(s) of posterior muscle group at lower leg level, left leg, initial encounter: Secondary | ICD-10-CM | POA: Diagnosis not present

## 2017-01-01 DIAGNOSIS — M25561 Pain in right knee: Secondary | ICD-10-CM | POA: Diagnosis not present

## 2017-01-05 ENCOUNTER — Encounter: Payer: Self-pay | Admitting: Family Medicine

## 2017-01-05 ENCOUNTER — Ambulatory Visit (INDEPENDENT_AMBULATORY_CARE_PROVIDER_SITE_OTHER): Payer: Medicare Other | Admitting: Family Medicine

## 2017-01-05 VITALS — BP 139/81 | HR 78 | Wt 201.0 lb

## 2017-01-05 DIAGNOSIS — M25561 Pain in right knee: Secondary | ICD-10-CM | POA: Diagnosis not present

## 2017-01-05 DIAGNOSIS — E785 Hyperlipidemia, unspecified: Secondary | ICD-10-CM | POA: Diagnosis not present

## 2017-01-05 DIAGNOSIS — I10 Essential (primary) hypertension: Secondary | ICD-10-CM

## 2017-01-05 DIAGNOSIS — S86112A Strain of other muscle(s) and tendon(s) of posterior muscle group at lower leg level, left leg, initial encounter: Secondary | ICD-10-CM | POA: Diagnosis not present

## 2017-01-05 DIAGNOSIS — M25669 Stiffness of unspecified knee, not elsewhere classified: Secondary | ICD-10-CM | POA: Diagnosis not present

## 2017-01-05 DIAGNOSIS — E119 Type 2 diabetes mellitus without complications: Secondary | ICD-10-CM

## 2017-01-05 LAB — BAYER DCA HB A1C WAIVED: HB A1C (BAYER DCA - WAIVED): 7 % — ABNORMAL HIGH (ref <7.0)

## 2017-01-05 MED ORDER — BETAMETHASONE DIPROPIONATE 0.05 % EX CREA: TOPICAL_CREAM | Freq: Two times a day (BID) | CUTANEOUS | 0 refills | Status: DC

## 2017-01-05 NOTE — Progress Notes (Signed)
   BP 139/81   Pulse 78   Wt 201 lb (91.2 kg)   SpO2 98%   BMI 29.01 kg/m    Subjective:    Patient ID: Troy Christensen, male    DOB: 1950/11/17, 66 y.o.   MRN: [id number]  HPI: Troy Christensen is a 66 y.o. male  Chief Complaint  Patient presents with  . Follow-up  . Diabetes  Patient all in all doing well no complaints taking medications without problems noted low blood sugar spells adjustments in diabetes medicines have worked out well with good control. IVs gotten better will give betamethasone cream or has wife's Temovate cream and he can use. Discussed avoidance  Relevant past medical, surgical, family and social history reviewed and updated as indicated. Interim medical history since our last visit reviewed. Allergies and medications reviewed and updated.  Review of Systems  Constitutional: Negative.   Respiratory: Negative.   Cardiovascular: Negative.     Per HPI unless specifically indicated above     Objective:    BP 139/81   Pulse 78   Wt 201 lb (91.2 kg)   SpO2 98%   BMI 29.01 kg/m   Wt Readings from Last 3 Encounters:  01/05/17 201 lb (91.2 kg)  12/08/16 199 lb 12.8 oz (90.6 kg)  11/17/16 199 lb (90.3 kg)    Physical Exam  Constitutional: He is oriented to person, place, and time. He appears well-developed and well-nourished.  HENT:  Head: Normocephalic and atraumatic.  Eyes: Conjunctivae and EOM are normal.  Neck: Normal range of motion.  Cardiovascular: Normal rate, regular rhythm and normal heart sounds.   Pulmonary/Chest: Effort normal and breath sounds normal.  Musculoskeletal: Normal range of motion.  Neurological: He is alert and oriented to person, place, and time.  Skin: No erythema.  Psychiatric: He has a normal mood and affect. His behavior is normal. Judgment and thought content normal.    Results for orders placed or performed in visit on 12/08/16  CBC With Differential/Platelet  Result Value Ref Range   WBC 3.8 3.4 - 10.8  x10E3/uL   RBC 3.96 (L) 4.14 - 5.80 x10E6/uL   Hemoglobin 12.2 (L) 13.0 - 17.7 g/dL   Hematocrit 36.9 (L) 37.5 - 51.0 %   MCV 93 79 - 97 fL   MCH 30.8 26.6 - 33.0 pg   MCHC 33.1 31.5 - 35.7 g/dL   RDW 14.8 12.3 - 15.4 %   Platelets 178 150 - 379 x10E3/uL   Neutrophils 62 Not Estab. %   Lymphs 26 Not Estab. %   MID 13 Not Estab. %   Neutrophils Absolute 2.3 1.4 - 7.0 x10E3/uL   Lymphocytes Absolute 1.0 0.7 - 3.1 x10E3/uL   MID (Absolute) 0.5 0.1 - 1.6 X10E3/uL      Assessment & Plan:   Problem List Items Addressed This Visit      Cardiovascular and Mediastinum   Hypertension - Primary   Relevant Orders   Bayer DCA Hb A1c Waived     Endocrine   Diabetes mellitus ([place])   Relevant Orders   Bayer DCA Hb A1c Waived     Other   Hyperlipidemia   Relevant Orders   Bayer DCA Hb A1c Waived       Follow up plan: Return in about 3 months (around 04/06/2017) for Hemoglobin A1c, BMP,  Lipids, ALT, AST.

## 2017-01-12 DIAGNOSIS — S86112A Strain of other muscle(s) and tendon(s) of posterior muscle group at lower leg level, left leg, initial encounter: Secondary | ICD-10-CM | POA: Diagnosis not present

## 2017-01-12 DIAGNOSIS — M25669 Stiffness of unspecified knee, not elsewhere classified: Secondary | ICD-10-CM | POA: Diagnosis not present

## 2017-01-12 DIAGNOSIS — M25561 Pain in right knee: Secondary | ICD-10-CM | POA: Diagnosis not present

## 2017-01-15 DIAGNOSIS — M791 Myalgia: Secondary | ICD-10-CM | POA: Diagnosis not present

## 2017-01-15 DIAGNOSIS — S86112A Strain of other muscle(s) and tendon(s) of posterior muscle group at lower leg level, left leg, initial encounter: Secondary | ICD-10-CM | POA: Diagnosis not present

## 2017-01-21 ENCOUNTER — Encounter (INDEPENDENT_AMBULATORY_CARE_PROVIDER_SITE_OTHER): Payer: Self-pay

## 2017-02-15 ENCOUNTER — Other Ambulatory Visit: Payer: Self-pay | Admitting: Family Medicine

## 2017-02-16 NOTE — Telephone Encounter (Signed)
Last (acute) OV: 01/05/17 Last routine OV: 12/08/16 Next OV:

## 2017-02-19 DIAGNOSIS — H10413 Chronic giant papillary conjunctivitis, bilateral: Secondary | ICD-10-CM | POA: Diagnosis not present

## 2017-02-19 DIAGNOSIS — H25813 Combined forms of age-related cataract, bilateral: Secondary | ICD-10-CM | POA: Diagnosis not present

## 2017-02-19 DIAGNOSIS — H524 Presbyopia: Secondary | ICD-10-CM | POA: Diagnosis not present

## 2017-02-19 DIAGNOSIS — H401132 Primary open-angle glaucoma, bilateral, moderate stage: Secondary | ICD-10-CM | POA: Diagnosis not present

## 2017-02-25 ENCOUNTER — Other Ambulatory Visit: Payer: Self-pay | Admitting: *Deleted

## 2017-02-25 MED ORDER — SILDENAFIL CITRATE 20 MG PO TABS: 20.0000 mg | ORAL_TABLET | Freq: Every day | ORAL | 5 refills | Status: DC | PRN

## 2017-03-03 ENCOUNTER — Other Ambulatory Visit: Payer: Self-pay | Admitting: *Deleted

## 2017-03-03 MED ORDER — SILDENAFIL CITRATE 20 MG PO TABS: 20.0000 mg | ORAL_TABLET | Freq: Every day | ORAL | 5 refills | Status: DC | PRN

## 2017-03-04 ENCOUNTER — Ambulatory Visit
Admission: RE | Admit: 2017-03-04 | Discharge: 2017-03-04 | Disposition: A | Discharge status: Home / Self Care | Payer: Medicare Other | Source: Ambulatory Visit | Attending: Family Medicine | Admitting: Family Medicine

## 2017-03-04 ENCOUNTER — Telehealth: Payer: Self-pay | Admitting: Family Medicine

## 2017-03-04 ENCOUNTER — Ambulatory Visit (INDEPENDENT_AMBULATORY_CARE_PROVIDER_SITE_OTHER): Payer: Medicare Other | Admitting: Family Medicine

## 2017-03-04 ENCOUNTER — Encounter: Payer: Self-pay | Admitting: Family Medicine

## 2017-03-04 VITALS — BP 104/69 | HR 74 | Temp 97.8°F | Wt 202.5 lb

## 2017-03-04 DIAGNOSIS — T07XXXA Unspecified multiple injuries, initial encounter: Secondary | ICD-10-CM | POA: Diagnosis not present

## 2017-03-04 DIAGNOSIS — M79672 Pain in left foot: Secondary | ICD-10-CM | POA: Diagnosis not present

## 2017-03-04 DIAGNOSIS — S8992XA Unspecified injury of left lower leg, initial encounter: Secondary | ICD-10-CM

## 2017-03-04 DIAGNOSIS — M799 Soft tissue disorder, unspecified: Secondary | ICD-10-CM | POA: Diagnosis not present
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
## 2017-03-04 NOTE — Telephone Encounter (Signed)
Called to let patient to let him know that his x-rays came back normal. RICE. Continue to monitor. Call with any concerns.

## 2017-04-06 ENCOUNTER — Encounter: Payer: Self-pay | Admitting: Family Medicine

## 2017-04-06 ENCOUNTER — Ambulatory Visit (INDEPENDENT_AMBULATORY_CARE_PROVIDER_SITE_OTHER): Payer: Medicare Other | Admitting: Family Medicine

## 2017-04-06 DIAGNOSIS — I10 Essential (primary) hypertension: Secondary | ICD-10-CM

## 2017-04-06 DIAGNOSIS — E119 Type 2 diabetes mellitus without complications: Secondary | ICD-10-CM | POA: Diagnosis not present

## 2017-04-06 DIAGNOSIS — E785 Hyperlipidemia, unspecified: Secondary | ICD-10-CM | POA: Diagnosis not present

## 2017-04-06 NOTE — Assessment & Plan Note (Signed)
The current medical regimen is effective;  continue present plan and medications.  

## 2017-04-06 NOTE — Progress Notes (Signed)
   BP 135/87   Pulse (!) 57   Ht 5\' 10"  (1.778 m)   Wt 199 lb (90.3 kg)   SpO2 98%   BMI 28.55 kg/m    Subjective:    Patient ID: Troy Christensen, male    DOB: 01-25-1951, 66 y.o.   MRN: [id number]  HPI: Troy Christensen is a 66 y.o. male  Chief Complaint  Patient presents with  . Follow-up  . Diabetes  . Hypertension  . Hyperlipidemia  Patient follow-up diabetes no complaints noted low blood sugar spells or issues. Blood pressure good control no issues with medications and the same with cholesterol.   Relevant past medical, surgical, family and social history reviewed and updated as indicated. Interim medical history since our last visit reviewed. Allergies and medications reviewed and updated.  Review of Systems  Constitutional: Negative.   Respiratory: Negative.   Cardiovascular: Negative.     Per HPI unless specifically indicated above     Objective:    BP 135/87   Pulse (!) 57   Ht 5\' 10"  (1.778 m)   Wt 199 lb (90.3 kg)   SpO2 98%   BMI 28.55 kg/m   Wt Readings from Last 3 Encounters:  04/06/17 199 lb (90.3 kg)  03/04/17 202 lb 8 oz (91.9 kg)  01/05/17 201 lb (91.2 kg)    Physical Exam  Constitutional: He is oriented to person, place, and time. He appears well-developed and well-nourished.  HENT:  Head: Normocephalic and atraumatic.  Eyes: Conjunctivae and EOM are normal.  Neck: Normal range of motion.  Cardiovascular: Normal rate, regular rhythm and normal heart sounds.   Pulmonary/Chest: Effort normal and breath sounds normal.  Musculoskeletal: Normal range of motion.  Neurological: He is alert and oriented to person, place, and time.  Skin: No erythema.  Psychiatric: He has a normal mood and affect. His behavior is normal. Judgment and thought content normal.    Results for orders placed or performed in visit on 01/05/17  Bayer DCA Hb A1c Waived  Result Value Ref Range   Bayer DCA Hb A1c Waived 7.0 (H) <7.0 %      Assessment & Plan:    Problem List Items Addressed This Visit      Cardiovascular and Mediastinum   Hypertension    The current medical regimen is effective;  continue present plan and medications.       Relevant Orders   Bayer DCA Hb A1c Waived   Basic metabolic panel   LP+ALT+AST [place], Waived     Endocrine   Diabetes mellitus ([place])    The current medical regimen is effective;  continue present plan and medications.       Relevant Orders   Bayer DCA Hb A1c Waived   Basic metabolic panel   LP+ALT+AST Piccolo, Waived     Other   Hyperlipidemia    The current medical regimen is effective;  continue present plan and medications.       Relevant Orders   Bayer DCA Hb A1c Waived   Basic metabolic panel   LP+ALT+AST Piccolo, Waived       Follow up plan: Return in about 3 months (around 07/07/2017) for Hemoglobin A1c.

## 2017-04-07 ENCOUNTER — Encounter: Payer: Self-pay | Admitting: Family Medicine

## 2017-04-07 LAB — BASIC METABOLIC PANEL
BUN / CREAT RATIO: 14 (ref 10–24)
BUN: 16 mg/dL (ref 8–27)
CO2: 23 mmol/L (ref 20–29)
CREATININE: 1.13 mg/dL (ref 0.76–1.27)
Calcium: 9.4 mg/dL (ref 8.6–10.2)
Chloride: 102 mmol/L (ref 96–106)
GFR calc Af Amer: 78 mL/min/{1.73_m2} (ref >59)
GFR, EST NON AFRICAN AMERICAN: 68 mL/min/{1.73_m2} (ref >59)
GLUCOSE: 112 mg/dL — AB (ref 65–99)
Potassium: 4.8 mmol/L (ref 3.5–5.2)
SODIUM: 140 mmol/L (ref 134–144)

## 2017-04-09 LAB — LP+ALT+AST PICCOLO, WAIVED
ALT (SGPT) PICCOLO, WAIVED: 19 U/L (ref 10–47)
AST (SGOT) PICCOLO, WAIVED: 20 U/L (ref 11–38)
CHOL/HDL RATIO PICCOLO,WAIVE: 3.4 mg/dL
Cholesterol Piccolo, Waived: 170 mg/dL (ref <200)
HDL Chol Piccolo, Waived: 50 mg/dL — ABNORMAL LOW (ref >59)
LDL Chol Calc Piccolo Waived: 75 mg/dL (ref <100)
Triglycerides Piccolo,Waived: 225 mg/dL — ABNORMAL HIGH (ref <150)
VLDL Chol Calc Piccolo,Waive: 45 mg/dL — ABNORMAL HIGH (ref <30)

## 2017-04-09 LAB — BAYER DCA HB A1C WAIVED: HB A1C: 6.6 % (ref <7.0)

## 2017-04-20 DIAGNOSIS — H401132 Primary open-angle glaucoma, bilateral, moderate stage: Secondary | ICD-10-CM | POA: Diagnosis not present

## 2017-04-20 DIAGNOSIS — H524 Presbyopia: Secondary | ICD-10-CM | POA: Diagnosis not present

## 2017-04-20 DIAGNOSIS — H25813 Combined forms of age-related cataract, bilateral: Secondary | ICD-10-CM | POA: Diagnosis not present

## 2017-04-20 DIAGNOSIS — H10413 Chronic giant papillary conjunctivitis, bilateral: Secondary | ICD-10-CM | POA: Diagnosis not present

## 2017-04-20 DIAGNOSIS — H01004 Unspecified blepharitis left upper eyelid: Secondary | ICD-10-CM | POA: Diagnosis not present

## 2017-04-20 DIAGNOSIS — H01005 Unspecified blepharitis left lower eyelid: Secondary | ICD-10-CM | POA: Diagnosis not present

## 2017-05-19 ENCOUNTER — Other Ambulatory Visit: Payer: Self-pay | Admitting: Family Medicine

## 2017-05-19 NOTE — Telephone Encounter (Signed)
Troy Christensen would like a refill for contour next test strips [facility]. Pt stated he did not need the microlets.

## 2017-05-20 MED ORDER — GLUCOSE BLOOD VI STRP: 1.0000 | ORAL_STRIP | 4 refills | Status: DC | PRN

## 2017-05-21 ENCOUNTER — Other Ambulatory Visit: Payer: Self-pay | Admitting: *Deleted

## 2017-05-21 ENCOUNTER — Inpatient Hospital Stay: Payer: Medicare Other | Attending: Radiation Oncology

## 2017-05-21 DIAGNOSIS — C61 Malignant neoplasm of prostate: Secondary | ICD-10-CM | POA: Insufficient documentation

## 2017-05-21 DIAGNOSIS — Z8546 Personal history of malignant neoplasm of prostate: Secondary | ICD-10-CM

## 2017-05-21 LAB — PSA: PROSTATIC SPECIFIC ANTIGEN: 0.05 ng/mL (ref 0.00–4.00)

## 2017-05-22 ENCOUNTER — Telehealth: Payer: Self-pay

## 2017-05-22 NOTE — Telephone Encounter (Signed)
Received a fax from [facility] for a PA on patient's contour next test strips. It states that the preferred strips are FreeStyle Lite and Precision Xtra. Can we send in one of these to [facility] instead so they will be covered please?

## 2017-05-22 NOTE — Telephone Encounter (Signed)
Patient returned my call. I explained what was going on and he states that he does not have a meter in either of the brands covered by his insurance. Will fill out form for meter, strips, and lancets, have Cheryl sign, and fax to [employer].

## 2017-05-22 NOTE — Telephone Encounter (Signed)
Form signed by Malachy Mood and faxed to [facility].

## 2017-05-22 NOTE — Telephone Encounter (Signed)
Called and left patient a VM asking for him to please return my call.  

## 2017-05-22 NOTE — Telephone Encounter (Signed)
Sure.  Call pt and make sure he has a monitor for those strips

## 2017-05-25 ENCOUNTER — Other Ambulatory Visit: Payer: Self-pay | Admitting: Family Medicine

## 2017-05-25 NOTE — Telephone Encounter (Signed)
Patient walked in to inform that [facility] will no longer cover for his meter. Patient is  Able to get a meter through [facility] on [address] if a prescription is written for "freestyle Lite meter".  Please Advise.  Thank you.

## 2017-05-25 NOTE — Telephone Encounter (Signed)
Last OV: 04/06/17 Next OV: 07/14/17   No results found for: HGBA1C

## 2017-05-28 ENCOUNTER — Telehealth: Payer: Self-pay | Admitting: Family Medicine

## 2017-05-28 ENCOUNTER — Encounter: Payer: Self-pay | Admitting: Radiation Oncology

## 2017-05-28 ENCOUNTER — Ambulatory Visit
Admission: RE | Admit: 2017-05-28 | Discharge: 2017-05-28 | Disposition: A | Discharge status: Home / Self Care | Payer: Medicare Other | Source: Ambulatory Visit | Attending: Radiation Oncology | Admitting: Radiation Oncology

## 2017-05-28 ENCOUNTER — Other Ambulatory Visit: Payer: Self-pay | Admitting: *Deleted

## 2017-05-28 VITALS — BP 141/85 | HR 64 | Temp 97.0°F | Resp 20 | Wt 198.4 lb

## 2017-05-28 DIAGNOSIS — Z923 Personal history of irradiation: Secondary | ICD-10-CM | POA: Diagnosis not present

## 2017-05-28 DIAGNOSIS — C61 Malignant neoplasm of prostate: Secondary | ICD-10-CM | POA: Insufficient documentation

## 2017-05-28 NOTE — Telephone Encounter (Signed)
Patient needs a script for free style lite meter sent to [facility] in [place] ASAP. He stopped by the office this week to verbally ask this to be done and I am not sure why the message did not get routed.  Please make sure it is sent to [facility] in [place].    Thank You

## 2017-05-28 NOTE — Telephone Encounter (Signed)
Called and spoke with patient. He states that he does not need strips or lancets, just a meter. I told the patient that I was faxing the prescription to [facility] in [facility] for him now.

## 2017-05-28 NOTE — Progress Notes (Signed)
Radiation Oncology Follow up Note  Name: Troy Christensen   Date:   05/28/2017 MRN:  [id number] DOB: [birth year]    This 66 y.o. male presents to the clinic today for 2 year follow-up status post I MRT radiation therapy for stage IIB adenocarcinoma the prostate.  REFERRING PROVIDER: Guadalupe Maple, MD  HPI: Patient is a 66 year old male now out 2 years having completed radiation therapy to his prostate for stage IIB (T1 CN 0 M0) adenocarcinoma Gleason score of 8 (4+4) presenting the PSA of 6.3. He is seen today in routine follow-up and is doing well. He specifically denies diarrhea dysuria or any other GI/GU complaints. His most recent PSA this month was 0.05.Marland Kitchen  COMPLICATIONS OF TREATMENT: none  FOLLOW UP COMPLIANCE: keeps appointments   PHYSICM:  BP (!) 141/85   Pulse 64   Temp (!) 97 F (36.1 C)   Resp 20   Wt 198 lb 6.6 oz (90 kg)   BMI 28.47 kg/m  On rectal exam rectal sphincter tone is good. Prostate is smooth contracted without evidence of nodularity or mass. Sulcus is preserved bilaterally. No discrete nodularity is identified. No other rectal abnormalities are noted. Well-developed well-nourished patient in NAD. HEENT reveals PERLA, EOMI, discs not visualized.  Oral cavity is clear. No oral mucosal lesions are identified. Neck is clear without evidence of cervical or supraclavicular adenopathy. Lungs are clear to A&P. Cardiac examination is essentially unremarkable with regular rate and rhythm without murmur rub or thrill. Abdomen is benign with no organomegaly or masses noted. Motor sensory and DTR levels are equal and symmetric in the upper and lower extremities. Cranial nerves II through XII are grossly intact. Proprioception is intact. No peripheral adenopathy or edema is identified. No motor or sensory levels are noted. Crude visual fields are within normal range.  RADIOLOGY RESULTS: films for review  PLAN: Present time patient is doing well under excellent biochemical  control of his prostate cancer. I'm please was overall progress. I've asked to see him back in 1 year for follow-up. Patient is to call with any concerns.  I would like to take this opportunity to thank you for allowing me to participate in the care of your patient.Armstead Peaks., MD

## 2017-05-28 NOTE — Telephone Encounter (Signed)
See other phone message from today.  

## 2017-05-28 NOTE — Telephone Encounter (Signed)
Patient called and wanted the meter for his blood glucose [facility] due to being able to afford and pick it up at [facility].  Please Advise.  Thank you

## 2017-05-28 NOTE — Telephone Encounter (Signed)
Entered in error

## 2017-07-14 ENCOUNTER — Encounter: Payer: Self-pay | Admitting: Family Medicine

## 2017-07-14 ENCOUNTER — Ambulatory Visit (INDEPENDENT_AMBULATORY_CARE_PROVIDER_SITE_OTHER): Payer: Medicare Other | Admitting: Family Medicine

## 2017-07-14 ENCOUNTER — Other Ambulatory Visit: Payer: Medicare Other

## 2017-07-14 VITALS — BP 123/73 | HR 80 | Wt 203.0 lb

## 2017-07-14 DIAGNOSIS — E785 Hyperlipidemia, unspecified: Secondary | ICD-10-CM | POA: Diagnosis not present

## 2017-07-14 DIAGNOSIS — K5521 Angiodysplasia of colon with hemorrhage: Secondary | ICD-10-CM

## 2017-07-14 DIAGNOSIS — M1A1790 Lead-induced chronic gout, unspecified ankle and foot, without tophus (tophi): Secondary | ICD-10-CM | POA: Diagnosis not present

## 2017-07-14 DIAGNOSIS — C61 Malignant neoplasm of prostate: Secondary | ICD-10-CM

## 2017-07-14 DIAGNOSIS — K627 Radiation proctitis: Secondary | ICD-10-CM

## 2017-07-14 DIAGNOSIS — T560X1D Toxic effect of lead and its compounds, accidental (unintentional), subsequent encounter: Secondary | ICD-10-CM | POA: Diagnosis not present

## 2017-07-14 DIAGNOSIS — E119 Type 2 diabetes mellitus without complications: Secondary | ICD-10-CM

## 2017-07-14 DIAGNOSIS — I10 Essential (primary) hypertension: Secondary | ICD-10-CM | POA: Diagnosis not present

## 2017-07-14 NOTE — Assessment & Plan Note (Signed)
The current medical regimen is effective;  continue present plan and medications.  

## 2017-07-14 NOTE — Progress Notes (Signed)
   BP 123/73   Pulse 80   Wt 203 lb (92.1 kg)   SpO2 98%   BMI 29.13 kg/m    Subjective:    Patient ID: Troy Christensen, male    DOB: July 04, 1951, 66 y.o.   MRN: [id number]  HPI: Troy Christensen is a 66 y.o. male  Chief Complaint  Patient presents with  . Follow-up  . Diabetes  Patient follow-up diabetes doing well no complaints from medications taken faithfully with good control no low blood sugar spells. Other issues all stable blood pressure cholesterol takes medications faithfully without issues  Relevant past medical, surgical, family and social history reviewed and updated as indicated. Interim medical history since our last visit reviewed. Allergies and medications reviewed and updated.  Review of Systems  Constitutional: Negative.   Respiratory: Negative.   Cardiovascular: Negative.     Per HPI unless specifically indicated above     Objective:    BP 123/73   Pulse 80   Wt 203 lb (92.1 kg)   SpO2 98%   BMI 29.13 kg/m   Wt Readings from Last 3 Encounters:  07/14/17 203 lb (92.1 kg)  05/28/17 198 lb 6.6 oz (90 kg)  04/06/17 199 lb (90.3 kg)    Physical Exam  Constitutional: He is oriented to person, place, and time. He appears well-developed and well-nourished.  HENT:  Head: Normocephalic and atraumatic.  Eyes: Conjunctivae and EOM are normal.  Neck: Normal range of motion.  Cardiovascular: Normal rate, regular rhythm and normal heart sounds.   Pulmonary/Chest: Effort normal and breath sounds normal.  Musculoskeletal: Normal range of motion.  Neurological: He is alert and oriented to person, place, and time.  Skin: No erythema.  Psychiatric: He has a normal mood and affect. His behavior is normal. Judgment and thought content normal.    Results for orders placed or performed in visit on 05/21/17  PSA  Result Value Ref Range   Prostatic Specific Antigen 0.05 0.00 - 4.00 ng/mL      Assessment & Plan:   Problem List Items Addressed This Visit      Cardiovascular and Mediastinum   Hypertension - Primary    The current medical regimen is effective;  continue present plan and medications.       Angiodysplasia of intestine with hemorrhage     Digestive   Radiation proctitis     Endocrine   Diabetes mellitus ([place])    The current medical regimen is effective;  continue present plan and medications.       Relevant Orders   Bayer DCA Hb A1c Waived     Genitourinary   Prostate cancer [facility])     Other   Hyperlipidemia    The current medical regimen is effective;  continue present plan and medications.       Gout       Follow up plan: Return in about 3 months (around 10/14/2017) for Physical Exam, Hemoglobin A1c.

## 2017-07-15 LAB — BAYER DCA HB A1C WAIVED: HB A1C: 6.7 % (ref <7.0)

## 2017-07-17 ENCOUNTER — Ambulatory Visit (INDEPENDENT_AMBULATORY_CARE_PROVIDER_SITE_OTHER): Payer: Medicare Other

## 2017-07-17 DIAGNOSIS — Z23 Encounter for immunization: Secondary | ICD-10-CM

## 2017-07-23 DIAGNOSIS — D1800 Hemangioma unspecified site: Secondary | ICD-10-CM | POA: Diagnosis not present

## 2017-07-23 DIAGNOSIS — L570 Actinic keratosis: Secondary | ICD-10-CM | POA: Diagnosis not present

## 2017-07-23 DIAGNOSIS — L821 Other seborrheic keratosis: Secondary | ICD-10-CM | POA: Diagnosis not present

## 2017-07-23 DIAGNOSIS — Z808 Family history of malignant neoplasm of other organs or systems: Secondary | ICD-10-CM | POA: Diagnosis not present

## 2017-07-23 DIAGNOSIS — L578 Other skin changes due to chronic exposure to nonionizing radiation: Secondary | ICD-10-CM | POA: Diagnosis not present

## 2017-07-23 DIAGNOSIS — Z1283 Encounter for screening for malignant neoplasm of skin: Secondary | ICD-10-CM | POA: Diagnosis not present

## 2017-07-23 DIAGNOSIS — D229 Melanocytic nevi, unspecified: Secondary | ICD-10-CM | POA: Diagnosis not present

## 2017-07-23 DIAGNOSIS — L812 Freckles: Secondary | ICD-10-CM | POA: Diagnosis not present

## 2017-07-31 ENCOUNTER — Encounter: Payer: Self-pay | Admitting: Family Medicine

## 2017-07-31 ENCOUNTER — Ambulatory Visit (INDEPENDENT_AMBULATORY_CARE_PROVIDER_SITE_OTHER): Payer: Medicare Other | Admitting: Family Medicine

## 2017-07-31 VITALS — BP 123/76 | HR 79 | Wt 202.0 lb

## 2017-07-31 DIAGNOSIS — J019 Acute sinusitis, unspecified: Secondary | ICD-10-CM | POA: Diagnosis not present

## 2017-07-31 MED ORDER — AZITHROMYCIN 250 MG PO TABS: ORAL_TABLET | ORAL | 0 refills | Status: DC

## 2017-07-31 MED ORDER — HYDROCOD POLST-CPM POLST ER 10-8 MG/5ML PO SUER: 2.5000 mL | Freq: Two times a day (BID) | ORAL | 0 refills | Status: DC | PRN

## 2017-07-31 NOTE — Progress Notes (Signed)
   BP 123/76   Pulse 79   Wt 202 lb (91.6 kg)   SpO2 99%   BMI 28.98 kg/m    Subjective:    Patient ID: Troy Christensen, male    DOB: 1951/05/06, 66 y.o.   MRN: [id number]  HPI: Troy Christensen is a 66 y.o. male  Chief Complaint  Patient presents with  . Cough  . Sore Throat  feeling bad and other systemic symptoms of some low-grade fever chills and achiness. This been ongoing for over a week. May be starting to clear a little bit but still just has bad cough and generally nonproductive. Also sinus pressure congestion drainage.   Relevant past medical, surgical, family and social history reviewed and updated as indicated. Interim medical history since our last visit reviewed. Allergies and medications reviewed and updated.  Review of Systems  Constitutional: Positive for activity change, chills, diaphoresis, fatigue and fever.  HENT: Positive for congestion, ear pain, sinus pressure, sinus pain, sneezing and sore throat.   Respiratory: Positive for cough.   Cardiovascular: Negative.     Per HPI unless specifically indicated above     Objective:    BP 123/76   Pulse 79   Wt 202 lb (91.6 kg)   SpO2 99%   BMI 28.98 kg/m   Wt Readings from Last 3 Encounters:  07/31/17 202 lb (91.6 kg)  07/14/17 203 lb (92.1 kg)  05/28/17 198 lb 6.6 oz (90 kg)    Physical Exam  Constitutional: He is oriented to person, place, and time. He appears well-developed and well-nourished.  HENT:  Head: Normocephalic and atraumatic.  Right Ear: External ear normal.  Left Ear: External ear normal.  Mouth/Throat: Oropharyngeal exudate present.  Eyes: Conjunctivae and EOM are normal.  Neck: Normal range of motion.  Cardiovascular: Normal rate, regular rhythm and normal heart sounds.  Pulmonary/Chest: Effort normal and breath sounds normal.  Musculoskeletal: Normal range of motion.  Neurological: He is alert and oriented to person, place, and time.  Skin: No erythema.  Psychiatric: He  has a normal mood and affect. His behavior is normal. Judgment and thought content normal.    Results for orders placed or performed in visit on 07/14/17  Bayer DCA Hb A1c Waived  Result Value Ref Range   Bayer DCA Hb A1c Waived 6.7 <7.0 %      Assessment & Plan:   Problem List Items Addressed This Visit    None    Visit Diagnoses    Acute sinusitis, recurrence not specified, unspecified location    -  Primary   Relevant Medications   azithromycin (ZITHROMAX) 250 MG tablet   chlorpheniramine-HYDROcodone (TUSSIONEX PENNKINETIC ER) 10-8 MG/5ML SUER     discussed sinusitis care and treatment use of over-the-counter medications such as Mucinex. Discussed cough syrup is limited and stay at home do not drive. Patient to try to hold antibiotics for now and not take right away. If getting worse will start antibiotics.  Follow up plan: Return if symptoms worsen or fail to improve, for As scheduled.

## 2017-08-05 ENCOUNTER — Other Ambulatory Visit: Payer: Self-pay | Admitting: Family Medicine

## 2017-08-27 DIAGNOSIS — H01004 Unspecified blepharitis left upper eyelid: Secondary | ICD-10-CM | POA: Diagnosis not present

## 2017-08-27 DIAGNOSIS — H401132 Primary open-angle glaucoma, bilateral, moderate stage: Secondary | ICD-10-CM | POA: Diagnosis not present

## 2017-08-27 DIAGNOSIS — H10413 Chronic giant papillary conjunctivitis, bilateral: Secondary | ICD-10-CM | POA: Diagnosis not present

## 2017-08-27 DIAGNOSIS — H25813 Combined forms of age-related cataract, bilateral: Secondary | ICD-10-CM | POA: Diagnosis not present

## 2017-10-02 ENCOUNTER — Other Ambulatory Visit: Payer: Self-pay | Admitting: Family Medicine

## 2017-10-02 DIAGNOSIS — E7800 Pure hypercholesterolemia, unspecified: Secondary | ICD-10-CM

## 2017-10-02 DIAGNOSIS — M1A1790 Lead-induced chronic gout, unspecified ankle and foot, without tophus (tophi): Secondary | ICD-10-CM

## 2017-10-02 DIAGNOSIS — T560X1D Toxic effect of lead and its compounds, accidental (unintentional), subsequent encounter: Principal | ICD-10-CM

## 2017-10-07 ENCOUNTER — Other Ambulatory Visit: Payer: Self-pay | Admitting: Family Medicine

## 2017-10-07 DIAGNOSIS — E119 Type 2 diabetes mellitus without complications: Secondary | ICD-10-CM

## 2017-10-15 ENCOUNTER — Other Ambulatory Visit: Payer: Self-pay | Admitting: Family Medicine

## 2017-10-15 ENCOUNTER — Encounter: Payer: Self-pay | Admitting: Family Medicine

## 2017-10-15 ENCOUNTER — Ambulatory Visit (INDEPENDENT_AMBULATORY_CARE_PROVIDER_SITE_OTHER): Payer: Medicare Other | Admitting: Family Medicine

## 2017-10-15 ENCOUNTER — Ambulatory Visit (INDEPENDENT_AMBULATORY_CARE_PROVIDER_SITE_OTHER): Payer: Medicare Other

## 2017-10-15 VITALS — BP 128/70 | HR 60 | Temp 97.3°F | Resp 16 | Ht 71.0 in | Wt 201.4 lb

## 2017-10-15 DIAGNOSIS — C61 Malignant neoplasm of prostate: Secondary | ICD-10-CM | POA: Diagnosis not present

## 2017-10-15 DIAGNOSIS — Z1159 Encounter for screening for other viral diseases: Secondary | ICD-10-CM | POA: Diagnosis not present

## 2017-10-15 DIAGNOSIS — T560X1D Toxic effect of lead and its compounds, accidental (unintentional), subsequent encounter: Secondary | ICD-10-CM

## 2017-10-15 DIAGNOSIS — I10 Essential (primary) hypertension: Secondary | ICD-10-CM

## 2017-10-15 DIAGNOSIS — E119 Type 2 diabetes mellitus without complications: Secondary | ICD-10-CM

## 2017-10-15 DIAGNOSIS — Z0000 Encounter for general adult medical examination without abnormal findings: Secondary | ICD-10-CM

## 2017-10-15 DIAGNOSIS — Z1329 Encounter for screening for other suspected endocrine disorder: Secondary | ICD-10-CM

## 2017-10-15 DIAGNOSIS — Z8546 Personal history of malignant neoplasm of prostate: Secondary | ICD-10-CM

## 2017-10-15 DIAGNOSIS — E785 Hyperlipidemia, unspecified: Secondary | ICD-10-CM | POA: Diagnosis not present

## 2017-10-15 DIAGNOSIS — Z23 Encounter for immunization: Secondary | ICD-10-CM

## 2017-10-15 DIAGNOSIS — Z7189 Other specified counseling: Secondary | ICD-10-CM

## 2017-10-15 DIAGNOSIS — M1A1790 Lead-induced chronic gout, unspecified ankle and foot, without tophus (tophi): Secondary | ICD-10-CM | POA: Diagnosis not present

## 2017-10-15 LAB — URINALYSIS, ROUTINE W REFLEX MICROSCOPIC
Bilirubin, UA: NEGATIVE
KETONES UA: NEGATIVE
LEUKOCYTES UA: NEGATIVE
NITRITE UA: NEGATIVE
Protein, UA: NEGATIVE
RBC UA: NEGATIVE
SPEC GRAV UA: 1.02 (ref 1.005–1.030)
Urobilinogen, Ur: 0.2 mg/dL (ref 0.2–1.0)
pH, UA: 5 (ref 5.0–7.5)

## 2017-10-15 LAB — BAYER DCA HB A1C WAIVED: HB A1C (BAYER DCA - WAIVED): 6.2 % (ref <7.0)

## 2017-10-15 MED ORDER — AMLODIPINE BESY-BENAZEPRIL HCL 5-20 MG PO CAPS: ORAL_CAPSULE | ORAL | 4 refills | Status: DC

## 2017-10-15 MED ORDER — DAPAGLIFLOZIN PROPANEDIOL 10 MG PO TABS: 10.0000 mg | ORAL_TABLET | Freq: Every day | ORAL | 4 refills | Status: DC

## 2017-10-15 MED ORDER — MELOXICAM 15 MG PO TABS: 15.0000 mg | ORAL_TABLET | Freq: Every day | ORAL | 4 refills | Status: DC

## 2017-10-15 MED ORDER — SILDENAFIL CITRATE 20 MG PO TABS: 20.0000 mg | ORAL_TABLET | ORAL | 12 refills | Status: DC | PRN

## 2017-10-15 MED ORDER — SITAGLIPTIN PHOSPHATE 100 MG PO TABS: 100.0000 mg | ORAL_TABLET | Freq: Every day | ORAL | 4 refills | Status: DC

## 2017-10-15 MED ORDER — METFORMIN HCL ER 500 MG PO TB24: 1000.0000 mg | ORAL_TABLET | Freq: Two times a day (BID) | ORAL | 4 refills | Status: DC

## 2017-10-15 NOTE — Assessment & Plan Note (Signed)
The current medical regimen is effective;  continue present plan and medications.  

## 2017-10-15 NOTE — Progress Notes (Signed)
Subjective:   Troy Christensen is a 67 y.o. male who presents for an Initial Medicare Annual Wellness Visit.  Review of Systems   Cardiac Risk Factors include: male gender;advanced age (>29men, >45 women);hypertension;dyslipidemia;diabetes mellitus    Objective:    Today's Vitals   10/15/17 0822  BP: 128/70  Pulse: 60  Resp: 16  Temp: (!) 97.3 F (36.3 C)  TempSrc: Temporal  Weight: 201 lb 6.4 oz (91.4 kg)  Height: 5\' 11"  (1.803 m)   Body mass index is 28.09 kg/m.  Advanced Directives 10/15/2017 05/28/2017 01/10/2016 12/17/2015 02/13/2015  Does Patient Have a Medical Advance Directive? Yes No Yes No Yes  Type of Paramedic of [place];Living will - Living will;Healthcare Power of [place]  Does patient want to make changes to medical advance directive? - - - - No - Patient declined  Copy of [place] in Chart? No - copy requested - No - copy requested - No - copy requested  Would patient like information on creating a medical advance directive? - No - Patient declined - - -    Current Medications (verified) Outpatient Encounter Medications as of 10/15/2017  Medication Sig  . allopurinol (ZYLOPRIM) 100 MG tablet TAKE 1 TABLET DAILY  . amLODipine-benazepril (LOTREL) 5-20 MG capsule TAKE 2 CAPSULES ONCE EACH DAY  . aspirin 81 MG tablet Take 81 mg by mouth daily. Reported on 11/20/2015  . FARXIGA 10 MG TABS tablet TAKE 1 TABLET DAILY  . glucose blood test strip 1 each by Other route as needed for other. Use contour next test strip to check sugar 2 times daily  . lovastatin (MEVACOR) 40 MG tablet TAKE 1 TABLET DAILY  . meloxicam (MOBIC) 15 MG tablet TAKE 1 TABLET DAILY  . metFORMIN (GLUCOPHAGE-XR) 500 MG 24 hr tablet Take 2 tablets (1,000 mg total) by mouth 2 (two) times daily.  . pioglitazone (ACTOS) 45 MG tablet TAKE 1 TABLET DAILY  . sildenafil (REVATIO) 20 MG tablet Take 1 tablet (20 mg total) by mouth  daily as needed.  . sitaGLIPtin (JANUVIA) 100 MG tablet Take 1 tablet (100 mg total) by mouth daily.  . timolol (TIMOPTIC) 0.5 % ophthalmic solution 1 drop 2 (two) times daily.  . DOCOSAHEXAENOIC ACID PO Take by mouth.  . [DISCONTINUED] azithromycin (ZITHROMAX) 250 MG tablet 2 now then 1 a day (Patient not taking: Reported on 10/15/2017)  . [DISCONTINUED] chlorpheniramine-HYDROcodone (TUSSIONEX PENNKINETIC ER) 10-8 MG/5ML SUER Take 2.5-5 mLs every 12 (twelve) hours as needed by mouth for cough. (Patient not taking: Reported on 10/15/2017)   No facility-administered encounter medications on file as of 10/15/2017.     Allergies (verified) Naprosyn [naproxen]   History: Past Medical History:  Diagnosis Date  . Cancer [facility])    Prostate  . Diabetes mellitus without complication ([place])   . Glaucoma   . Gout   . Hypercholesteremia   . Hypertension   . Osteoarthritis    hands  . Prostate cancer [facility])    Past Surgical History:  Procedure Laterality Date  . FLEXIBLE SIGMOIDOSCOPY N/A 01/10/2016   Procedure: FLEXIBLE SIGMOIDOSCOPY with  argon plasma coagulation;  Surgeon: Lucilla Lame, MD;  Location: [facility];  Service: Endoscopy;  Laterality: N/A;  Diabetic - oral meds  . HERNIA REPAIR    . JOINT REPLACEMENT  2014   Right Knee Replacement  . MENISCUS REPAIR Left   . nephrolithiasis     pt denies  . PROSTATE  SURGERY     Radiation treatments - no surgery  . ROTATOR CUFF REPAIR Right 2012   Family History  Problem Relation Age of Onset  . Cancer Father 68       lung  . Emphysema Mother   . Dementia Mother   . Cancer Brother        melanoma   Social History   Socioeconomic History  . Marital status: Married    Spouse name: None  . Number of children: None  . Years of education: None  . Highest education level: None  Social Needs  . Financial resource strain: Not hard at all  . Food insecurity - worry: Never true  . Food insecurity - inability: Never true  .  Transportation needs - medical: No  . Transportation needs - non-medical: No  Occupational History  . None  Tobacco Use  . Smoking status: Former Smoker    Types: Cigarettes    Last attempt to quit: 03/26/1975    Years since quitting: 42.5  . Smokeless tobacco: Never Used  Substance and Sexual Activity  . Alcohol use: No    Alcohol/week: 0.0 oz  . Drug use: No  . Sexual activity: None  Other Topics Concern  . None  Social History Narrative  . None   Tobacco Counseling Counseling given: Not Answered   Clinical Intake:  Pre-visit preparation completed: Yes  Pain : No/denies pain     Nutritional Status: BMI 25 -29 Overweight Nutritional Risks: None Diabetes: Yes CBG done?: No Did pt. bring in CBG monitor from home?: No  How often do you need to have someone help you when you read instructions, pamphlets, or other written materials from your doctor or pharmacy?: 1 - Never What is the last grade level you completed in school?: 12th grade  Interpreter Needed?: No  Information entered by :: Tiffany Hill,LPN   Activities of Daily Living In your present state of health, do you have any difficulty performing the following activities: 10/15/2017 03/04/2017  Hearing? N N  Vision? N N  Difficulty concentrating or making decisions? N N  Walking or climbing stairs? N N  Dressing or bathing? N N  Doing errands, shopping? N N  Preparing Food and eating ? N -  Using the Toilet? N -  In the past six months, have you accidently leaked urine? N -  Do you have problems with loss of bowel control? N -  Managing your Medications? N -  Managing your Finances? N -  Housekeeping or managing your Housekeeping? N -  Some recent data might be hidden     Immunizations and Health Maintenance Immunization History  Administered Date(s) Administered  . Influenza, High Dose Seasonal PF 07/21/2016, 07/17/2017  . Influenza,inj,Quad PF,6+ Mos 06/26/2015  . Influenza,inj,quad, With  Preservative 06/15/2016  . Pneumococcal Conjugate-13 09/24/2016  . Pneumococcal Polysaccharide-23 10/15/2017  . Tdap 08/19/2011  . Zoster 10/05/2013   Health Maintenance Due  Topic Date Due  . FOOT EXAM  11/19/2016  . OPHTHALMOLOGY EXAM  12/12/2016    Patient Care Team: Guadalupe Maple, MD as PCP - General (Family Medicine)  Indicate any recent Medical Services you may have received from other than [facility] providers in the past year (date may be approximate).    Assessment:   This is a routine wellness examination for [place].  Hearing/Vision screen Vision Screening Comments: Goes to [facility] associates every 6 months.   Dietary issues and exercise activities discussed: Current Exercise Habits: The patient  has a physically strenous job, but has no regular exercise apart from work., Exercise limited by: None identified  Goals    . DIET - INCREASE WATER INTAKE     recommend drinking at least 6-8 glasses of water a day        Depression Screen PHQ 2/9 Scores 10/15/2017 07/14/2017 05/28/2017 04/06/2017  PHQ - 2 Score 0 0 0 0  PHQ- 9 Score - - - -    Fall Risk Fall Risk  10/15/2017 07/31/2017 07/14/2017 05/28/2017 04/06/2017  Falls in the past year? No No No No No    Is the patient's home free of loose throw rugs in walkways, pet beds, electrical cords, etc?   yes      Grab bars in the bathroom? no      Handrails on the stairs?   no      Adequate lighting?   yes  Timed Get Up and Go performed: Completed in 8 seconds with no use of assistive devices, steady gait. No intervention needed at this time.   Cognitive Function:     6CIT Screen 10/15/2017  What Year? 0 points  What month? 0 points  What time? 0 points  Count back from 20 0 points  Months in reverse 0 points  Repeat phrase 0 points  Total Score 0    Screening Tests Health Maintenance  Topic Date Due  . FOOT EXAM  11/19/2016  . OPHTHALMOLOGY EXAM  12/12/2016  . Hepatitis C Screening  03/04/2018 (Originally  August 19, 1951)  . HEMOGLOBIN A1C  01/12/2018  . TETANUS/TDAP  08/18/2021  . COLONOSCOPY  12/20/2024  . INFLUENZA VACCINE  Completed  . PNA vac Low Risk Adult  Completed    Qualifies for Shingles Vaccine? Discussed shingrix vaccine   Cancer Screenings: Lung: Low Dose CT Chest recommended if Age 54-80 years, 30 pack-year currently smoking OR have quit w/in 15years. Patient does not qualify. Colorectal:completed 12/21/2014  Additional Screenings:  Hepatitis B/HIV/Syphillis: not indicated Hepatitis C Screening: done today      Plan:    I have personally reviewed and addressed the Medicare Annual Wellness questionnaire and have noted the following in the patient's chart:  A. Medical and social history B. Use of alcohol, tobacco or illicit drugs  C. Current medications and supplements D. Functional ability and status E.  Nutritional status F.  Physical activity G. Advance directives H. List of other physicians I.  Hospitalizations, surgeries, and ER visits in previous 12 months J.  [place] such as hearing and vision if needed, cognitive and depression L. Referrals and appointments   In addition, I have reviewed and discussed with patient certain preventive protocols, quality metrics, and best practice recommendations. A written personalized care plan for preventive services as well as general preventive health recommendations were provided to patient.   Signed,  Tyler Aas, LPN Nurse Health Advisor   Nurse Notes: will obtain records from [facility] for diabetic eye exam from oct 2018

## 2017-10-15 NOTE — Progress Notes (Signed)
Faxed request to [facility] for most recent diabetic eye exam results.

## 2017-10-15 NOTE — Progress Notes (Signed)
There were no vitals taken for this visit.   Subjective:    Patient ID: Troy Christensen, male    DOB: Apr 03, 1951, 66 y.o.   MRN: [id number]  HPI: Troy Christensen is a 67 y.o. male  Annual exam  Patient all in all doing well diabetes no low blood sugar spells taking medications without problems and good control. Same for cholesterol and blood pressure.  Takes without side effects or problems. Allopurinol no issues no gout symptoms or signs. Has some itching type dry skin on his right flank area with no rash changes. Taking sildenafil 20 mg does not seem to work.  Relevant past medical, surgical, family and social history reviewed and updated as indicated. Interim medical history since our last visit reviewed. Allergies and medications reviewed and updated.  Review of Systems  Constitutional: Negative.   HENT: Negative.   Eyes: Negative.   Respiratory: Negative.   Cardiovascular: Negative.   Gastrointestinal: Negative.   Endocrine: Negative.   Genitourinary: Negative.   Musculoskeletal: Negative.   Skin: Negative.   Allergic/Immunologic: Negative.   Neurological: Negative.   Hematological: Negative.   Psychiatric/Behavioral: Negative.     Per HPI unless specifically indicated above     Objective:    There were no vitals taken for this visit.  Wt Readings from Last 3 Encounters:  10/15/17 201 lb 6.4 oz (91.4 kg)  07/31/17 202 lb (91.6 kg)  07/14/17 203 lb (92.1 kg)    Physical Exam  Constitutional: He is oriented to person, place, and time. He appears well-developed and well-nourished.  HENT:  Head: Normocephalic and atraumatic.  Right Ear: External ear normal.  Left Ear: External ear normal.  Eyes: Conjunctivae and EOM are normal. Pupils are equal, round, and reactive to light.  Neck: Normal range of motion. Neck supple.  Cardiovascular: Normal rate, regular rhythm, normal heart sounds and intact distal pulses.  Pulmonary/Chest: Effort normal and breath  sounds normal.  Abdominal: Soft. Bowel sounds are normal. There is no splenomegaly or hepatomegaly.  Genitourinary: Rectum normal and penis normal.  Genitourinary Comments: radiation  Musculoskeletal: Normal range of motion.  Neurological: He is alert and oriented to person, place, and time. He has normal reflexes.  Skin: No rash noted. No erythema.  Psychiatric: He has a normal mood and affect. His behavior is normal. Judgment and thought content normal.    Results for orders placed or performed in visit on 10/15/17  Bayer DCA Hb A1c Waived  Result Value Ref Range   Bayer DCA Hb A1c Waived 6.2 <7.0 %  Urinalysis, Routine w reflex microscopic  Result Value Ref Range   Specific Gravity, UA 1.020 1.005 - 1.030   pH, UA 5.0 5.0 - 7.5   Color, UA Yellow Yellow   Appearance Ur Clear Clear   Leukocytes, UA Negative Negative   Protein, UA Negative Negative/Trace   Glucose, UA 3+ (A) Negative   Ketones, UA Negative Negative   RBC, UA Negative Negative   Bilirubin, UA Negative Negative   Urobilinogen, Ur 0.2 0.2 - 1.0 mg/dL   Nitrite, UA Negative Negative      Assessment & Plan:   Problem List Items Addressed This Visit      Cardiovascular and Mediastinum   Hypertension - Primary    The current medical regimen is effective;  continue present plan and medications.       Relevant Medications   amLODipine-benazepril (LOTREL) 5-20 MG capsule   sildenafil (REVATIO) 20 MG tablet   Other  Relevant Orders   Bayer DCA Hb A1c Waived (Completed)   CBC with Differential/Platelet   Comprehensive metabolic panel   Lipid panel   Urinalysis, Routine w reflex microscopic (Completed)     Endocrine   Diabetes mellitus ([place])    The current medical regimen is effective;  continue present plan and medications.       Relevant Medications   sitaGLIPtin (JANUVIA) 100 MG tablet   metFORMIN (GLUCOPHAGE-XR) 500 MG 24 hr tablet   dapagliflozin propanediol (FARXIGA) 10 MG TABS tablet    amLODipine-benazepril (LOTREL) 5-20 MG capsule   Other Relevant Orders   Bayer DCA Hb A1c Waived (Completed)   CBC with Differential/Platelet   Comprehensive metabolic panel   Urinalysis, Routine w reflex microscopic (Completed)     Genitourinary   Prostate cancer ([place])    stable      Relevant Orders   PSA     Other   Hyperlipidemia    The current medical regimen is effective;  continue present plan and medications.       Relevant Medications   amLODipine-benazepril (LOTREL) 5-20 MG capsule   sildenafil (REVATIO) 20 MG tablet   Other Relevant Orders   Lipid panel   History of prostate cancer   Relevant Orders   PSA   Hepatitis C Antibody   Gout    The current medical regimen is effective;  continue present plan and medications.       Advanced care planning/counseling discussion    A voluntary discussion about advance care planning including the explanation and discussion of advance directives was extensively discussed  with the patient.  Explanation about the health care proxy and Living will was reviewed and packet with forms with explanation of how to fill them out was given. Time spent: encounter 16+ min       Individuals present: pt and wife        Other Visit Diagnoses    Thyroid disorder screen       Relevant Orders   TSH       Follow up plan: Return in about 6 months (around 04/14/2018) for Hemoglobin A1c, BMP,  Lipids, ALT, AST.

## 2017-10-15 NOTE — Telephone Encounter (Signed)
Patient states all of his scripts need to be sent to [facility] instead of [facility] with the exception of Sildenafil which has been sent already correctly.   Please correct thank you

## 2017-10-15 NOTE — Assessment & Plan Note (Signed)
stable °

## 2017-10-15 NOTE — Patient Instructions (Signed)
Troy Christensen , Thank you for taking time to come for your Medicare Wellness Visit. I appreciate your ongoing commitment to your health goals. Please review the following plan we discussed and let me know if I can assist you in the future.   Screening recommendations/referrals: Colonoscopy: completed 12/21/2014 Recommended yearly ophthalmology/optometry visit for glaucoma screening and checkup Recommended yearly dental visit for hygiene and checkup  Vaccinations: Influenza vaccine: up to date  Pneumococcal vaccine: pneumovax 23 done today Tdap vaccine: up to date  Shingles vaccine: up to date  Advanced directives: Please bring a copy of your health care power of attorney and living will to the office at your convenience.  Conditions/risks identified: recommend drinking at least 6-8 glasses of water a day   Next appointment: Follow up in one year for your annual wellness exam.  Preventive Care 65 Years and Older, Male Preventive care refers to lifestyle choices and visits with your health care provider that can promote health and wellness. What does preventive care include?  A yearly physical exam. This is also called an annual well check.  Dental exams once or twice a year.  Routine eye exams. Ask your health care provider how often you should have your eyes checked.  Personal lifestyle choices, including:  Daily care of your teeth and gums.  Regular physical activity.  Eating a healthy diet.  Avoiding tobacco and drug use.  Limiting alcohol use.  Practicing safe sex.  Taking low doses of aspirin every day.  Taking vitamin and mineral supplements as recommended by your health care provider. What happens during an annual well check? The services and screenings done by your health care provider during your annual well check will depend on your age, overall health, lifestyle risk factors, and family history of disease. Counseling  Your health care provider may ask you  questions about your:  Alcohol use.  Tobacco use.  Drug use.  Emotional well-being.  Home and relationship well-being.  Sexual activity.  Eating habits.  History of falls.  Memory and ability to understand (cognition).  Work and work Statistician. Screening  You may have the following tests or measurements:  Height, weight, and BMI.  Blood pressure.  Lipid and cholesterol levels. These may be checked every 5 years, or more frequently if you are over 69 years old.  Skin check.  Lung cancer screening. You may have this screening every year starting at age 64 if you have a 30-pack-year history of smoking and currently smoke or have quit within the past 15 years.  Fecal occult blood test (FOBT) of the stool. You may have this test every year starting at age 25.  Flexible sigmoidoscopy or colonoscopy. You may have a sigmoidoscopy every 5 years or a colonoscopy every 10 years starting at age 36.  Prostate cancer screening. Recommendations will vary depending on your family history and other risks.  Hepatitis C blood test.  Hepatitis B blood test.  Sexually transmitted disease (STD) testing.  Diabetes screening. This is done by checking your blood sugar (glucose) after you have not eaten for a while (fasting). You may have this done every 1-3 years.  Abdominal aortic aneurysm (AAA) screening. You may need this if you are a current or former smoker.  Osteoporosis. You may be screened starting at age 24 if you are at high risk. Talk with your health care provider about your test results, treatment options, and if necessary, the need for more tests. Vaccines  Your health care provider may recommend  certain vaccines, such as:  Influenza vaccine. This is recommended every year.  Tetanus, diphtheria, and acellular pertussis (Tdap, Td) vaccine. You may need a Td booster every 10 years.  Zoster vaccine. You may need this after age 52.  Pneumococcal 13-valent conjugate  (PCV13) vaccine. One dose is recommended after age 56.  Pneumococcal polysaccharide (PPSV23) vaccine. One dose is recommended after age 34. Talk to your health care provider about which screenings and vaccines you need and how often you need them. This information is not intended to replace advice given to you by your health care provider. Make sure you discuss any questions you have with your health care provider. Document Released: 09/28/2015 Document Revised: 05/21/2016 Document Reviewed: 07/03/2015 Elsevier Interactive Patient Education  2017 [employer] Prevention in the Home Falls can cause injuries. They can happen to people of all ages. There are many things you can do to make your home safe and to help prevent falls. What can I do on the outside of my home?  Regularly fix the edges of walkways and driveways and fix any cracks.  Remove anything that might make you trip as you walk through a door, such as a raised step or threshold.  Trim any bushes or trees on the path to your home.  Use bright outdoor lighting.  Clear any walking paths of anything that might make someone trip, such as rocks or tools.  Regularly check to see if handrails are loose or broken. Make sure that both sides of any steps have handrails.  Any raised decks and porches should have guardrails on the edges.  Have any leaves, snow, or ice cleared regularly.  Use sand or salt on walking paths during winter.  Clean up any spills in your garage right away. This includes oil or grease spills. What can I do in the bathroom?  Use night lights.  Install grab bars by the toilet and in the tub and shower. Do not use towel bars as grab bars.  Use non-skid mats or decals in the tub or shower.  If you need to sit down in the shower, use a plastic, non-slip stool.  Keep the floor dry. Clean up any water that spills on the floor as soon as it happens.  Remove soap buildup in the tub or shower  regularly.  Attach bath mats securely with double-sided non-slip rug tape.  Do not have throw rugs and other things on the floor that can make you trip. What can I do in the bedroom?  Use night lights.  Make sure that you have a light by your bed that is easy to reach.  Do not use any sheets or blankets that are too big for your bed. They should not hang down onto the floor.  Have a firm chair that has side arms. You can use this for support while you get dressed.  Do not have throw rugs and other things on the floor that can make you trip. What can I do in the kitchen?  Clean up any spills right away.  Avoid walking on wet floors.  Keep items that you use a lot in easy-to-reach places.  If you need to reach something above you, use a strong step stool that has a grab bar.  Keep electrical cords out of the way.  Do not use floor polish or wax that makes floors slippery. If you must use wax, use non-skid floor wax.  Do not have throw rugs and other  things on the floor that can make you trip. What can I do with my stairs?  Do not leave any items on the stairs.  Make sure that there are handrails on both sides of the stairs and use them. Fix handrails that are broken or loose. Make sure that handrails are as long as the stairways.  Check any carpeting to make sure that it is firmly attached to the stairs. Fix any carpet that is loose or worn.  Avoid having throw rugs at the top or bottom of the stairs. If you do have throw rugs, attach them to the floor with carpet tape.  Make sure that you have a light switch at the top of the stairs and the bottom of the stairs. If you do not have them, ask someone to add them for you. What else can I do to help prevent falls?  Wear shoes that:  Do not have high heels.  Have rubber bottoms.  Are comfortable and fit you well.  Are closed at the toe. Do not wear sandals.  If you use a stepladder:  Make sure that it is fully  opened. Do not climb a closed stepladder.  Make sure that both sides of the stepladder are locked into place.  Ask someone to hold it for you, if possible.  Clearly mark and make sure that you can see:  Any grab bars or handrails.  First and last steps.  Where the edge of each step is.  Use tools that help you move around (mobility aids) if they are needed. These include:  Canes.  Walkers.  Scooters.  Crutches.  Turn on the lights when you go into a dark area. Replace any light bulbs as soon as they burn out.  Set up your furniture so you have a clear path. Avoid moving your furniture around.  If any of your floors are uneven, fix them.  If there are any pets around you, be aware of where they are.  Review your medicines with your doctor. Some medicines can make you feel dizzy. This can increase your chance of falling. Ask your doctor what other things that you can do to help prevent falls. This information is not intended to replace advice given to you by your health care provider. Make sure you discuss any questions you have with your health care provider. Document Released: 06/28/2009 Document Revised: 02/07/2016 Document Reviewed: 10/06/2014 Elsevier Interactive Patient Education  2017 [employer].

## 2017-10-15 NOTE — Assessment & Plan Note (Signed)
A voluntary discussion about advance care planning including the explanation and discussion of advance directives was extensively discussed  with the patient.  Explanation about the health care proxy and Living will was reviewed and packet with forms with explanation of how to fill them out was given. Time spent: encounter 16+ min       Individuals present: pt and wife

## 2017-10-16 ENCOUNTER — Other Ambulatory Visit: Payer: Self-pay | Admitting: Family Medicine

## 2017-10-16 DIAGNOSIS — E119 Type 2 diabetes mellitus without complications: Secondary | ICD-10-CM

## 2017-10-16 LAB — COMPREHENSIVE METABOLIC PANEL
ALT: 29 IU/L (ref 0–44)
AST: 26 IU/L (ref 0–40)
Albumin/Globulin Ratio: 2.1 (ref 1.2–2.2)
Albumin: 4.9 g/dL — ABNORMAL HIGH (ref 3.6–4.8)
Alkaline Phosphatase: 52 IU/L (ref 39–117)
BUN/Creatinine Ratio: 14 (ref 10–24)
BUN: 16 mg/dL (ref 8–27)
Bilirubin Total: 0.3 mg/dL (ref 0.0–1.2)
CALCIUM: 10 mg/dL (ref 8.6–10.2)
CO2: 23 mmol/L (ref 20–29)
CREATININE: 1.17 mg/dL (ref 0.76–1.27)
Chloride: 102 mmol/L (ref 96–106)
GFR, EST AFRICAN AMERICAN: 75 mL/min/{1.73_m2} (ref >59)
GFR, EST NON AFRICAN AMERICAN: 65 mL/min/{1.73_m2} (ref >59)
GLUCOSE: 110 mg/dL — AB (ref 65–99)
Globulin, Total: 2.3 g/dL (ref 1.5–4.5)
Potassium: 4.8 mmol/L (ref 3.5–5.2)
Sodium: 142 mmol/L (ref 134–144)
TOTAL PROTEIN: 7.2 g/dL (ref 6.0–8.5)

## 2017-10-16 LAB — CBC WITH DIFFERENTIAL/PLATELET
BASOS ABS: 0 10*3/uL (ref 0.0–0.2)
BASOS: 1 %
EOS (ABSOLUTE): 0.2 10*3/uL (ref 0.0–0.4)
Eos: 6 %
Hematocrit: 41.6 % (ref 37.5–51.0)
Hemoglobin: 14 g/dL (ref 13.0–17.7)
IMMATURE GRANS (ABS): 0 10*3/uL (ref 0.0–0.1)
IMMATURE GRANULOCYTES: 0 %
LYMPHS: 24 %
Lymphocytes Absolute: 0.9 10*3/uL (ref 0.7–3.1)
MCH: 31.9 pg (ref 26.6–33.0)
MCHC: 33.7 g/dL (ref 31.5–35.7)
MCV: 95 fL (ref 79–97)
Monocytes Absolute: 0.4 10*3/uL (ref 0.1–0.9)
Monocytes: 11 %
NEUTROS PCT: 58 %
Neutrophils Absolute: 2.2 10*3/uL (ref 1.4–7.0)
PLATELETS: 189 10*3/uL (ref 150–379)
RBC: 4.39 x10E6/uL (ref 4.14–5.80)
RDW: 14.4 % (ref 12.3–15.4)
WBC: 3.7 10*3/uL (ref 3.4–10.8)

## 2017-10-16 LAB — LIPID PANEL
Chol/HDL Ratio: 3.9 ratio (ref 0.0–5.0)
Cholesterol, Total: 185 mg/dL (ref 100–199)
HDL: 48 mg/dL (ref >39)
LDL Calculated: 90 mg/dL (ref 0–99)
TRIGLYCERIDES: 233 mg/dL — AB (ref 0–149)
VLDL CHOLESTEROL CAL: 47 mg/dL — AB (ref 5–40)

## 2017-10-16 LAB — HEPATITIS C ANTIBODY: Hep C Virus Ab: <0.1 s/co ratio (ref 0.0–0.9)

## 2017-10-16 LAB — TSH: TSH: 1.35 u[IU]/mL (ref 0.450–4.500)

## 2017-10-16 LAB — PSA: PROSTATE SPECIFIC AG, SERUM: 0.2 ng/mL (ref 0.0–4.0)

## 2017-10-16 NOTE — Telephone Encounter (Signed)
Pt needs sent to Mail order.

## 2017-10-19 ENCOUNTER — Encounter: Payer: Self-pay | Admitting: Family Medicine

## 2017-10-19 MED ORDER — AMLODIPINE BESY-BENAZEPRIL HCL 5-20 MG PO CAPS: ORAL_CAPSULE | ORAL | 4 refills | Status: DC

## 2017-10-19 MED ORDER — METFORMIN HCL ER 500 MG PO TB24: 1000.0000 mg | ORAL_TABLET | Freq: Two times a day (BID) | ORAL | 4 refills | Status: DC

## 2017-10-19 MED ORDER — DAPAGLIFLOZIN PROPANEDIOL 10 MG PO TABS: 10.0000 mg | ORAL_TABLET | Freq: Every day | ORAL | 4 refills | Status: DC

## 2017-10-19 MED ORDER — MELOXICAM 15 MG PO TABS: 15.0000 mg | ORAL_TABLET | Freq: Every day | ORAL | 4 refills | Status: DC

## 2017-10-19 MED ORDER — SITAGLIPTIN PHOSPHATE 100 MG PO TABS: 100.0000 mg | ORAL_TABLET | Freq: Every day | ORAL | 4 refills | Status: DC

## 2017-11-30 ENCOUNTER — Encounter: Payer: Self-pay | Admitting: Family Medicine

## 2017-11-30 ENCOUNTER — Ambulatory Visit (INDEPENDENT_AMBULATORY_CARE_PROVIDER_SITE_OTHER): Payer: Medicare Other | Admitting: Family Medicine

## 2017-11-30 VITALS — BP 152/76 | HR 59 | Temp 97.7°F | Ht 70.0 in | Wt 204.2 lb

## 2017-11-30 DIAGNOSIS — R112 Nausea with vomiting, unspecified: Secondary | ICD-10-CM | POA: Diagnosis not present

## 2017-11-30 DIAGNOSIS — R42 Dizziness and giddiness: Secondary | ICD-10-CM | POA: Diagnosis not present

## 2017-11-30 MED ORDER — PROMETHAZINE HCL 25 MG/ML IJ SOLN
25.0000 mg | Freq: Once | INTRAMUSCULAR | Status: AC
  Administered 2017-11-30: 25 mg via INTRAMUSCULAR

## 2017-11-30 MED ORDER — ONDANSETRON 4 MG PO TBDP: 4.0000 mg | ORAL_TABLET | Freq: Three times a day (TID) | ORAL | 0 refills | Status: DC | PRN

## 2017-11-30 MED ORDER — MECLIZINE HCL 25 MG PO TABS: 25.0000 mg | ORAL_TABLET | Freq: Three times a day (TID) | ORAL | 0 refills | Status: DC | PRN

## 2017-11-30 NOTE — Progress Notes (Signed)
   BP (!) 152/76 (BP Location: Right Arm, Patient Position: Sitting, Cuff Size: Normal)   Pulse (!) 59   Temp 97.7 F (36.5 C) (Oral)   Ht 5\' 10"  (1.778 m)   Wt 204 lb 3.2 oz (92.6 kg)   SpO2 99%   BMI 29.30 kg/m    Subjective:    Patient ID: Troy Christensen, male    DOB: 1951/03/24, 67 y.o.   MRN: [id number]  HPI: Troy Christensen is a 67 y.o. male  Chief Complaint  Patient presents with  . Dizziness    Patient states he woke up this morning dizzy with nausea and vomiting. States he feels like his equallibirum is off. No apetite. Denies diarrhea.   . Nausea  . Vomiting   Pt here with dizziness, nausea, vomiting (mostly dry heaving) since waking up this morning. Feels like "his equilibrium is off" ever since sitting up in bed. No hx of this type of episode in the past. Denies fever, abdominal pain, diarrhea, inability to tolerate PO, syncope, CP, visual changes. Has not tried anything OTC, just went back to bed and laid still.   Relevant past medical, surgical, family and social history reviewed and updated as indicated. Interim medical history since our last visit reviewed. Allergies and medications reviewed and updated.  Review of Systems  Per HPI unless specifically indicated above     Objective:    BP (!) 152/76 (BP Location: Right Arm, Patient Position: Sitting, Cuff Size: Normal)   Pulse (!) 59   Temp 97.7 F (36.5 C) (Oral)   Ht 5\' 10"  (1.778 m)   Wt 204 lb 3.2 oz (92.6 kg)   SpO2 99%   BMI 29.30 kg/m   Wt Readings from Last 3 Encounters:  11/30/17 204 lb 3.2 oz (92.6 kg)  10/15/17 201 lb 6.4 oz (91.4 kg)  07/31/17 202 lb (91.6 kg)    Physical Exam  Constitutional: He is oriented to person, place, and time. He appears well-developed and well-nourished. No distress.  HENT:  Head: Atraumatic.  Right Ear: External ear normal.  Left Ear: External ear normal.  Eyes: Conjunctivae are normal. Pupils are equal, round, and reactive to light.  No nystagmus    Neck: Normal range of motion. Neck supple.  Cardiovascular: Normal rate, normal heart sounds and intact distal pulses.  Pulmonary/Chest: Effort normal and breath sounds normal. No respiratory distress.  Musculoskeletal: Normal range of motion.  Neurological: He is alert and oriented to person, place, and time. No cranial nerve deficit.  Skin: Skin is warm.  Psychiatric: He has a normal mood and affect. His behavior is normal.  Nursing note and vitals reviewed.     Assessment & Plan:   Problem List Items Addressed This Visit    None    Visit Diagnoses    Dizziness    -  Primary   Suspect vertigo, will tx with meclizine, zofran prn. Epley maneuver handout given. Return precautions reviewed at length. Pt declines EKG today   Nausea and vomiting, intractability of vomiting not specified, unspecified vomiting type       IM phenergan given in office. Pt knows not to drive for the next 6-29 hours due to sedation.    Relevant Medications   promethazine (PHENERGAN) injection 25 mg (Completed)       Follow up plan: Return if symptoms worsen or fail to improve.

## 2017-11-30 NOTE — Patient Instructions (Addendum)

## 2018-01-11 ENCOUNTER — Encounter: Payer: Self-pay | Admitting: Family Medicine

## 2018-01-11 ENCOUNTER — Ambulatory Visit (INDEPENDENT_AMBULATORY_CARE_PROVIDER_SITE_OTHER): Payer: Medicare Other | Admitting: Family Medicine

## 2018-01-11 VITALS — BP 127/79 | HR 90 | Temp 98.6°F | Wt 201.9 lb

## 2018-01-11 DIAGNOSIS — L089 Local infection of the skin and subcutaneous tissue, unspecified: Secondary | ICD-10-CM | POA: Insufficient documentation

## 2018-01-11 DIAGNOSIS — L02419 Cutaneous abscess of limb, unspecified: Secondary | ICD-10-CM

## 2018-01-11 DIAGNOSIS — E785 Hyperlipidemia, unspecified: Secondary | ICD-10-CM | POA: Diagnosis not present

## 2018-01-11 DIAGNOSIS — L03119 Cellulitis of unspecified part of limb: Secondary | ICD-10-CM

## 2018-01-11 DIAGNOSIS — E119 Type 2 diabetes mellitus without complications: Secondary | ICD-10-CM | POA: Diagnosis not present

## 2018-01-11 LAB — BAYER DCA HB A1C WAIVED: HB A1C: 6.6 % (ref <7.0)

## 2018-01-11 MED ORDER — SULFAMETHOXAZOLE-TRIMETHOPRIM 800-160 MG PO TABS: 1.0000 | ORAL_TABLET | Freq: Two times a day (BID) | ORAL | 0 refills | Status: DC

## 2018-01-11 NOTE — Assessment & Plan Note (Signed)
Skin infection possibly MRSA CBC indicating normal WBC. Culture performed of left groin area. We will start on Septra DS 1 twice daily.  Discussed hot compresses. Recheck in 2 days.

## 2018-01-11 NOTE — Assessment & Plan Note (Signed)
The current medical regimen is effective;  continue present plan and medications.  

## 2018-01-11 NOTE — Progress Notes (Signed)
   BP 127/79   Pulse 90   Temp 98.6 F (37 C) (Oral)   Wt 201 lb 14.4 oz (91.6 kg)   SpO2 98%   BMI 28.97 kg/m    Subjective:    Patient ID: Troy Christensen, male    DOB: Mar 16, 1951, 67 y.o.   MRN: [id number]  HPI: Troy Christensen is a 67 y.o. male  Chief Complaint  Patient presents with  . Rash  Patient with rash in groin especially left groin area buttocks and left medial mid calf area with marked discoloration red infected.  Oozing pus from left leg. This is been ongoing weekend getting worse. Patient's diabetes has otherwise been doing well.  Relevant past medical, surgical, family and social history reviewed and updated as indicated. Interim medical history since our last visit reviewed. Allergies and medications reviewed and updated.  Review of Systems  Constitutional: Negative.   Respiratory: Negative.   Cardiovascular: Negative.     Per HPI unless specifically indicated above     Objective:    BP 127/79   Pulse 90   Temp 98.6 F (37 C) (Oral)   Wt 201 lb 14.4 oz (91.6 kg)   SpO2 98%   BMI 28.97 kg/m   Wt Readings from Last 3 Encounters:  01/11/18 201 lb 14.4 oz (91.6 kg)  11/30/17 204 lb 3.2 oz (92.6 kg)  10/15/17 201 lb 6.4 oz (91.4 kg)    Physical Exam  Constitutional: He is oriented to person, place, and time. He appears well-developed and well-nourished.  HENT:  Head: Normocephalic and atraumatic.  Eyes: Conjunctivae and EOM are normal.  Neck: Normal range of motion.  Cardiovascular: Normal rate, regular rhythm and normal heart sounds.  Pulmonary/Chest: Effort normal and breath sounds normal.  Musculoskeletal: Normal range of motion.  Neurological: He is alert and oriented to person, place, and time.  Skin: No erythema.  As noted above  Psychiatric: He has a normal mood and affect. His behavior is normal. Judgment and thought content normal.    Results for orders placed or performed in visit on 01/11/18  Bayer DCA Hb A1c Waived  Result  Value Ref Range   Bayer DCA Hb A1c Waived 6.6 <7.0 %      Assessment & Plan:   Problem List Items Addressed This Visit      Endocrine   Diabetes mellitus ([place])    The current medical regimen is effective;  continue present plan and medications.       Relevant Orders   Bayer DCA Hb A1c Waived (Completed)     Musculoskeletal and Integument   Skin infection    Skin infection possibly MRSA CBC indicating normal WBC. Culture performed of left groin area. We will start on Septra DS 1 twice daily.  Discussed hot compresses. Recheck in 2 days.      Relevant Medications   sulfamethoxazole-trimethoprim (BACTRIM DS,SEPTRA DS) 800-160 MG tablet     Other   Hyperlipidemia    Other Visit Diagnoses    Cellulitis and abscess of lower extremity    -  Primary   Relevant Orders   CBC With Differential/Platelet   Basic metabolic panel   Wound culture       Follow up plan: Return in about 2 days (around 01/13/2018), or if symptoms worsen or fail to improve.

## 2018-01-12 ENCOUNTER — Encounter: Payer: Self-pay | Admitting: Family Medicine

## 2018-01-12 LAB — CBC WITH DIFFERENTIAL/PLATELET
HEMOGLOBIN: 13.7 g/dL (ref 13.0–17.7)
Hematocrit: 40.2 % (ref 37.5–51.0)
Lymphocytes Absolute: 1.1 10*3/uL (ref 0.7–3.1)
Lymphs: 18 %
MCH: 32.4 pg (ref 26.6–33.0)
MCHC: 34.1 g/dL (ref 31.5–35.7)
MCV: 95 fL (ref 79–97)
MID (Absolute): 0.9 10*3/uL (ref 0.1–1.6)
MID: 15 %
NEUTROS ABS: 4 10*3/uL (ref 1.4–7.0)
NEUTROS PCT: 67 %
PLATELETS: 225 10*3/uL (ref 150–379)
RBC: 4.23 x10E6/uL (ref 4.14–5.80)
RDW: 14.3 % (ref 12.3–15.4)
WBC: 6 10*3/uL (ref 3.4–10.8)

## 2018-01-12 LAB — BASIC METABOLIC PANEL
BUN / CREAT RATIO: 15 (ref 10–24)
BUN: 17 mg/dL (ref 8–27)
CO2: 21 mmol/L (ref 20–29)
CREATININE: 1.17 mg/dL (ref 0.76–1.27)
Calcium: 9.5 mg/dL (ref 8.6–10.2)
Chloride: 100 mmol/L (ref 96–106)
GFR calc non Af Amer: 65 mL/min/{1.73_m2} (ref >59)
GFR, EST AFRICAN AMERICAN: 75 mL/min/{1.73_m2} (ref >59)
Glucose: 116 mg/dL — ABNORMAL HIGH (ref 65–99)
Potassium: 4.7 mmol/L (ref 3.5–5.2)
SODIUM: 137 mmol/L (ref 134–144)

## 2018-01-13 ENCOUNTER — Ambulatory Visit: Payer: Medicare Other | Admitting: Family Medicine

## 2018-01-13 ENCOUNTER — Ambulatory Visit (INDEPENDENT_AMBULATORY_CARE_PROVIDER_SITE_OTHER): Payer: Medicare Other | Admitting: Family Medicine

## 2018-01-13 ENCOUNTER — Encounter: Payer: Self-pay | Admitting: Family Medicine

## 2018-01-13 DIAGNOSIS — A4902 Methicillin resistant Staphylococcus aureus infection, unspecified site: Secondary | ICD-10-CM | POA: Insufficient documentation

## 2018-01-13 LAB — WOUND CULTURE

## 2018-01-13 LAB — SPECIMEN STATUS REPORT

## 2018-01-13 NOTE — Assessment & Plan Note (Signed)
We will continue current care hot compresses patient education given This is a presumptive diagnosis on review patient's culture was rejected by [facility] because of no gel in tube.

## 2018-01-13 NOTE — Progress Notes (Signed)
BP 101/67   Pulse 72   Ht 5\' 10"  (1.778 m)   Wt 202 lb (91.6 kg)   SpO2 97%   BMI 28.98 kg/m    Subjective:    Patient ID: Troy Christensen, male    DOB: 07-Feb-1951, 67 y.o.   MRN: [id number]  HPI: Troy Christensen is a 67 y.o. male  Chief Complaint  Patient presents with  . Follow-up    Cellulitis and abscess of lower extremity  Patient follow-up with presumptive diagnosis of MRSA. Patient taking Septra without problems. Oozing in the left groin area has largely stopped with redness improving.  Also lesions on buttocks area also showing improvement. Left calf area lesion solidified not using eschar was attempted to lift off but was too painful to move. Discussed continuing Septra Discussed hot compresses and how to do.  Use Vaseline on lesions.  Relevant past medical, surgical, family and social history reviewed and updated as indicated. Interim medical history since our last visit reviewed. Allergies and medications reviewed and updated.  Review of Systems  Constitutional: Negative.   Respiratory: Negative.   Cardiovascular: Negative.     Per HPI unless specifically indicated above     Objective:    BP 101/67   Pulse 72   Ht 5\' 10"  (1.778 m)   Wt 202 lb (91.6 kg)   SpO2 97%   BMI 28.98 kg/m   Wt Readings from Last 3 Encounters:  01/13/18 202 lb (91.6 kg)  01/11/18 201 lb 14.4 oz (91.6 kg)  11/30/17 204 lb 3.2 oz (92.6 kg)    Physical Exam  Constitutional: He is oriented to person, place, and time. He appears well-developed and well-nourished.  HENT:  Head: Normocephalic and atraumatic.  Eyes: Conjunctivae and EOM are normal.  Neck: Normal range of motion.  Cardiovascular: Normal rate, regular rhythm and normal heart sounds.  Pulmonary/Chest: Effort normal and breath sounds normal.  Musculoskeletal: Normal range of motion.  Neurological: He is alert and oriented to person, place, and time.  Skin: No erythema.  Lesion showing improvement as noted  above  Psychiatric: He has a normal mood and affect. His behavior is normal. Judgment and thought content normal.    Results for orders placed or performed in visit on 01/11/18  Wound culture  Result Value Ref Range   Gram Stain Result CANCELED    Aerobic Bacterial Culture CANCELED   Bayer DCA Hb A1c Waived  Result Value Ref Range   Bayer DCA Hb A1c Waived 6.6 <7.0 %  CBC With Differential/Platelet  Result Value Ref Range   WBC 6.0 3.4 - 10.8 x10E3/uL   RBC 4.23 4.14 - 5.80 x10E6/uL   Hemoglobin 13.7 13.0 - 17.7 g/dL   Hematocrit 40.2 37.5 - 51.0 %   MCV 95 79 - 97 fL   MCH 32.4 26.6 - 33.0 pg   MCHC 34.1 31.5 - 35.7 g/dL   RDW 14.3 12.3 - 15.4 %   Platelets 225 150 - 379 x10E3/uL   Neutrophils 67 Not Estab. %   Lymphs 18 Not Estab. %   MID 15 Not Estab. %   Neutrophils Absolute 4.0 1.4 - 7.0 x10E3/uL   Lymphocytes Absolute 1.1 0.7 - 3.1 x10E3/uL   MID (Absolute) 0.9 0.1 - 1.6 [id number]  Basic metabolic panel  Result Value Ref Range   Glucose 116 (H) 65 - 99 mg/dL   BUN 17 8 - 27 mg/dL   Creatinine, Ser 1.17 0.76 - 1.27 mg/dL   GFR  calc non Af Amer 65 >59 mL/min/1.73   GFR calc Af Amer 75 >59 mL/min/1.73   BUN/Creatinine Ratio 15 10 - 24   Sodium 137 134 - 144 mmol/L   Potassium 4.7 3.5 - 5.2 mmol/L   Chloride 100 96 - 106 mmol/L   CO2 21 20 - 29 mmol/L   Calcium 9.5 8.6 - 10.2 mg/dL  Specimen status report  Result Value Ref Range   specimen status report Comment       Assessment & Plan:   Problem List Items Addressed This Visit      Other   MRSA (methicillin resistant Staphylococcus aureus) infection    We will continue current care hot compresses patient education given This is a presumptive diagnosis on review patient's culture was rejected by [facility] because of no gel in tube.          Follow up plan: Return if symptoms worsen or fail to improve, for As scheduled.

## 2018-01-14 ENCOUNTER — Ambulatory Visit: Payer: Medicare Other | Admitting: Family Medicine

## 2018-03-04 DIAGNOSIS — H25813 Combined forms of age-related cataract, bilateral: Secondary | ICD-10-CM | POA: Diagnosis not present

## 2018-03-04 DIAGNOSIS — H524 Presbyopia: Secondary | ICD-10-CM | POA: Diagnosis not present

## 2018-03-04 DIAGNOSIS — H401132 Primary open-angle glaucoma, bilateral, moderate stage: Secondary | ICD-10-CM | POA: Diagnosis not present

## 2018-03-11 ENCOUNTER — Telehealth: Payer: Self-pay | Admitting: Family Medicine

## 2018-03-11 NOTE — Telephone Encounter (Signed)
Patient stopped by the office hoping to get an appointment with Dr Jeananne Rama but was unable to get the appt so was given an appointment 7/1 with Merrie Roof.  He wanted me to send a message to Dr Jeananne Rama to let him know that his arm has been hurting him for 3 weeks and he does not feel that it is muscular pain.  He does not know of an injury.   Just FYI

## 2018-03-15 ENCOUNTER — Ambulatory Visit (INDEPENDENT_AMBULATORY_CARE_PROVIDER_SITE_OTHER): Payer: Medicare Other | Admitting: Family Medicine

## 2018-03-15 ENCOUNTER — Encounter: Payer: Self-pay | Admitting: Family Medicine

## 2018-03-15 VITALS — BP 106/61 | HR 60 | Temp 98.7°F | Ht 70.0 in | Wt 200.4 lb

## 2018-03-15 DIAGNOSIS — M25512 Pain in left shoulder: Secondary | ICD-10-CM | POA: Diagnosis not present

## 2018-03-15 MED ORDER — PREDNISONE 10 MG PO TABS: ORAL_TABLET | ORAL | 0 refills | Status: DC

## 2018-03-15 NOTE — Progress Notes (Signed)
BP 106/61 (BP Location: Left Arm, Patient Position: Sitting, Cuff Size: Normal)   Pulse 60   Temp 98.7 F (37.1 C) (Oral)   Ht 5\' 10"  (1.778 m)   Wt 200 lb 6.4 oz (90.9 kg)   SpO2 95%   BMI 28.75 kg/m    Subjective:    Patient ID: Troy Christensen, male    DOB: [birth year], 67 y.o.   MRN: [id number]  HPI: Troy Christensen is a 67 y.o. male  Chief Complaint  Patient presents with  . Arm Pain    Lifts at work  . Shoulder Pain   Pt c/o left shoulder pain and stiffness for about a week now. Thinks it may be from lifting things at work. Trying voltaren gel and tylenol with some relief. Denies redness, swelling, fevers, numbness or tingling, weakness, significant decrease in ROM. Has had a rotator cuff injury on the other side and states it does not feel similar, and has known OA which he also does not feel this is related to.   Relevant past medical, surgical, family and social history reviewed and updated as indicated. Interim medical history since our last visit reviewed. Allergies and medications reviewed and updated.  Review of Systems  Per HPI unless specifically indicated above     Objective:    BP 106/61 (BP Location: Left Arm, Patient Position: Sitting, Cuff Size: Normal)   Pulse 60   Temp 98.7 F (37.1 C) (Oral)   Ht 5\' 10"  (1.778 m)   Wt 200 lb 6.4 oz (90.9 kg)   SpO2 95%   BMI 28.75 kg/m   Wt Readings from Last 3 Encounters:  03/15/18 200 lb 6.4 oz (90.9 kg)  01/13/18 202 lb (91.6 kg)  01/11/18 201 lb 14.4 oz (91.6 kg)    Physical Exam  Constitutional: He is oriented to person, place, and time. He appears well-developed and well-nourished. No distress.  HENT:  Head: Atraumatic.  Eyes: Pupils are equal, round, and reactive to light.  Neck: Normal range of motion. Neck supple.  Cardiovascular: Normal rate.  Pulmonary/Chest: Effort normal.  Musculoskeletal:  PROM full b/l UEs, left shoulder ROM minimally limited due to pt discomfort with posterior  extension Left deltoid ttp Grip strength full and equal b/l  Neurological: He is alert and oriented to person, place, and time.  Skin: Skin is warm and dry.  Psychiatric: He has a normal mood and affect. His behavior is normal.  Nursing note and vitals reviewed.   Results for orders placed or performed in visit on 01/11/18  Wound culture  Result Value Ref Range   Gram Stain Result CANCELED    Aerobic Bacterial Culture CANCELED   Bayer DCA Hb A1c Waived  Result Value Ref Range   HB A1C (BAYER DCA - WAIVED) 6.6 <7.0 %  CBC With Differential/Platelet  Result Value Ref Range   WBC 6.0 3.4 - 10.8 x10E3/uL   RBC 4.23 4.14 - 5.80 x10E6/uL   Hemoglobin 13.7 13.0 - 17.7 g/dL   Hematocrit 40.2 37.5 - 51.0 %   MCV 95 79 - 97 fL   MCH 32.4 26.6 - 33.0 pg   MCHC 34.1 31.5 - 35.7 g/dL   RDW 14.3 12.3 - 15.4 %   Platelets 225 150 - 379 x10E3/uL   Neutrophils 67 Not Estab. %   Lymphs 18 Not Estab. %   MID 15 Not Estab. %   Neutrophils Absolute 4.0 1.4 - 7.0 x10E3/uL   Lymphocytes Absolute 1.1 0.7 -  3.1 x10E3/uL   MID (Absolute) 0.9 0.1 - 1.6 [id number]  Basic metabolic panel  Result Value Ref Range   Glucose 116 (H) 65 - 99 mg/dL   BUN 17 8 - 27 mg/dL   Creatinine, Ser 1.17 0.76 - 1.27 mg/dL   GFR calc non Af Amer 65 >59 mL/min/1.73   GFR calc Af Amer 75 >59 mL/min/1.73   BUN/Creatinine Ratio 15 10 - 24   Sodium 137 134 - 144 mmol/L   Potassium 4.7 3.5 - 5.2 mmol/L   Chloride 100 96 - 106 mmol/L   CO2 21 20 - 29 mmol/L   Calcium 9.5 8.6 - 10.2 mg/dL  Specimen status report  Result Value Ref Range   specimen status report Comment       Assessment & Plan:   Problem List Items Addressed This Visit    None    Visit Diagnoses    Acute pain of left shoulder    -  Primary   Suspect muscular plus underlying OA, cont meloxicam, gentle stretches, heating pad. Declines muscle relaxers, will try prednisone taper. F/u if no better       Follow up plan: Return if symptoms worsen or  fail to improve.

## 2018-03-18 NOTE — Patient Instructions (Signed)
Follow up as needed

## 2018-04-27 ENCOUNTER — Encounter: Payer: Self-pay | Admitting: Family Medicine

## 2018-04-27 ENCOUNTER — Ambulatory Visit (INDEPENDENT_AMBULATORY_CARE_PROVIDER_SITE_OTHER): Payer: Medicare Other | Admitting: Family Medicine

## 2018-04-27 VITALS — BP 137/83 | HR 60 | Temp 97.4°F | Ht 70.0 in | Wt 198.9 lb

## 2018-04-27 DIAGNOSIS — E119 Type 2 diabetes mellitus without complications: Secondary | ICD-10-CM | POA: Diagnosis not present

## 2018-04-27 DIAGNOSIS — E785 Hyperlipidemia, unspecified: Secondary | ICD-10-CM | POA: Diagnosis not present

## 2018-04-27 DIAGNOSIS — I10 Essential (primary) hypertension: Secondary | ICD-10-CM

## 2018-04-27 LAB — LP+ALT+AST PICCOLO, WAIVED
ALT (SGPT) Piccolo, Waived: 31 U/L (ref 10–47)
AST (SGOT) Piccolo, Waived: 28 U/L (ref 11–38)
Chol/HDL Ratio Piccolo,Waive: 3.1 mg/dL
Cholesterol Piccolo, Waived: 160 mg/dL (ref <200)
HDL CHOL PICCOLO, WAIVED: 52 mg/dL — AB (ref >59)
LDL Chol Calc Piccolo Waived: 62 mg/dL (ref <100)
Triglycerides Piccolo,Waived: 231 mg/dL — ABNORMAL HIGH (ref <150)
VLDL Chol Calc Piccolo,Waive: 46 mg/dL — ABNORMAL HIGH (ref <30)

## 2018-04-27 LAB — BAYER DCA HB A1C WAIVED: HB A1C: 6.9 % (ref <7.0)

## 2018-04-27 NOTE — Assessment & Plan Note (Signed)
The current medical regimen is effective;  continue present plan and medications.  

## 2018-04-27 NOTE — Progress Notes (Signed)
BP 137/83   Pulse 60   Temp (!) 97.4 F (36.3 C) (Oral)   Ht 5\' 10"  (1.778 m)   Wt 198 lb 14.4 oz (90.2 kg)   SpO2 98%   BMI 28.54 kg/m    Subjective:    Patient ID: Troy Christensen, male    DOB: 1950/09/20, 67 y.o.   MRN: [id number]  HPI: Troy Christensen is a 67 y.o. male  Chief Complaint  Patient presents with  . Diabetes  . Hyperlipidemia  . Hypertension  Patient all in all doing well no complaints taking medication without problems or side effects.  No low blood sugar spells. Cholesterol doing well without problems blood pressure also doing well without problems. No gout signs or symptoms taking allopurinol without problems.   Relevant past medical, surgical, family and social history reviewed and updated as indicated. Interim medical history since our last visit reviewed. Allergies and medications reviewed and updated.  Review of Systems  Constitutional: Negative.   Respiratory: Negative.   Cardiovascular: Negative.     Per HPI unless specifically indicated above     Objective:    BP 137/83   Pulse 60   Temp (!) 97.4 F (36.3 C) (Oral)   Ht 5\' 10"  (1.778 m)   Wt 198 lb 14.4 oz (90.2 kg)   SpO2 98%   BMI 28.54 kg/m   Wt Readings from Last 3 Encounters:  04/27/18 198 lb 14.4 oz (90.2 kg)  03/15/18 200 lb 6.4 oz (90.9 kg)  01/13/18 202 lb (91.6 kg)    Physical Exam  Constitutional: He is oriented to person, place, and time. He appears well-developed and well-nourished.  HENT:  Head: Normocephalic and atraumatic.  Eyes: Conjunctivae and EOM are normal.  Neck: Normal range of motion.  Cardiovascular: Normal rate, regular rhythm and normal heart sounds.  Pulmonary/Chest: Effort normal and breath sounds normal.  Musculoskeletal: Normal range of motion.  Neurological: He is alert and oriented to person, place, and time.  Skin: No erythema.  Psychiatric: He has a normal mood and affect. His behavior is normal. Judgment and thought content normal.     Results for orders placed or performed in visit on 01/11/18  Wound culture  Result Value Ref Range   Gram Stain Result CANCELED    Aerobic Bacterial Culture CANCELED   Bayer DCA Hb A1c Waived  Result Value Ref Range   HB A1C (BAYER DCA - WAIVED) 6.6 <7.0 %  CBC With Differential/Platelet  Result Value Ref Range   WBC 6.0 3.4 - 10.8 x10E3/uL   RBC 4.23 4.14 - 5.80 x10E6/uL   Hemoglobin 13.7 13.0 - 17.7 g/dL   Hematocrit 40.2 37.5 - 51.0 %   MCV 95 79 - 97 fL   MCH 32.4 26.6 - 33.0 pg   MCHC 34.1 31.5 - 35.7 g/dL   RDW 14.3 12.3 - 15.4 %   Platelets 225 150 - 379 x10E3/uL   Neutrophils 67 Not Estab. %   Lymphs 18 Not Estab. %   MID 15 Not Estab. %   Neutrophils Absolute 4.0 1.4 - 7.0 x10E3/uL   Lymphocytes Absolute 1.1 0.7 - 3.1 x10E3/uL   MID (Absolute) 0.9 0.1 - 1.6 [id number]  Basic metabolic panel  Result Value Ref Range   Glucose 116 (H) 65 - 99 mg/dL   BUN 17 8 - 27 mg/dL   Creatinine, Ser 1.17 0.76 - 1.27 mg/dL   GFR calc non Af Amer 65 >59 mL/min/1.73   GFR calc  Af Amer 75 >59 mL/min/1.73   BUN/Creatinine Ratio 15 10 - 24   Sodium 137 134 - 144 mmol/L   Potassium 4.7 3.5 - 5.2 mmol/L   Chloride 100 96 - 106 mmol/L   CO2 21 20 - 29 mmol/L   Calcium 9.5 8.6 - 10.2 mg/dL  Specimen status report  Result Value Ref Range   specimen status report Comment       Assessment & Plan:   Problem List Items Addressed This Visit      Cardiovascular and Mediastinum   Hypertension    The current medical regimen is effective;  continue present plan and medications.       Relevant Orders   Basic metabolic panel     Endocrine   Diabetes mellitus ([place])    The current medical regimen is effective;  continue present plan and medications.         Other   Hyperlipidemia    The current medical regimen is effective;  continue present plan and medications.       Relevant Orders   LP+ALT+AST [place], [place]    Other Visit Diagnoses    Diabetes mellitus without  complication ([place])    -  Primary   Relevant Orders   Bayer DCA Hb A1c Waived       Follow up plan: Return in about 6 months (around 10/28/2018) for Physical Exam.

## 2018-04-28 LAB — BASIC METABOLIC PANEL
BUN / CREAT RATIO: 14 (ref 10–24)
BUN: 16 mg/dL (ref 8–27)
CO2: 21 mmol/L (ref 20–29)
CREATININE: 1.11 mg/dL (ref 0.76–1.27)
Calcium: 9.5 mg/dL (ref 8.6–10.2)
Chloride: 105 mmol/L (ref 96–106)
GFR calc non Af Amer: 69 mL/min/{1.73_m2} (ref >59)
GFR, EST AFRICAN AMERICAN: 80 mL/min/{1.73_m2} (ref >59)
GLUCOSE: 107 mg/dL — AB (ref 65–99)
Potassium: 4.8 mmol/L (ref 3.5–5.2)
Sodium: 141 mmol/L (ref 134–144)

## 2018-06-07 ENCOUNTER — Other Ambulatory Visit: Payer: Self-pay | Admitting: *Deleted

## 2018-06-07 ENCOUNTER — Inpatient Hospital Stay: Payer: Medicare Other | Attending: Radiation Oncology

## 2018-06-07 DIAGNOSIS — C61 Malignant neoplasm of prostate: Secondary | ICD-10-CM

## 2018-06-07 LAB — PSA: Prostatic Specific Antigen: 0.37 ng/mL (ref 0.00–4.00)

## 2018-06-14 ENCOUNTER — Other Ambulatory Visit: Payer: Self-pay

## 2018-06-14 ENCOUNTER — Ambulatory Visit
Admission: RE | Admit: 2018-06-14 | Discharge: 2018-06-14 | Disposition: A | Discharge status: Home / Self Care | Payer: Medicare Other | Source: Ambulatory Visit | Attending: Radiation Oncology | Admitting: Radiation Oncology

## 2018-06-14 ENCOUNTER — Encounter: Payer: Self-pay | Admitting: Radiation Oncology

## 2018-06-14 ENCOUNTER — Other Ambulatory Visit: Payer: Self-pay | Admitting: *Deleted

## 2018-06-14 VITALS — BP 117/73 | HR 65 | Temp 98.0°F | Wt 202.5 lb

## 2018-06-14 DIAGNOSIS — Z923 Personal history of irradiation: Secondary | ICD-10-CM | POA: Diagnosis not present

## 2018-06-14 DIAGNOSIS — C61 Malignant neoplasm of prostate: Secondary | ICD-10-CM | POA: Diagnosis not present

## 2018-06-14 NOTE — Progress Notes (Signed)
Radiation Oncology Follow up Note  Name: Troy Christensen   Date:   06/14/2018 MRN:  [id number] DOB: 1950-11-10    This 67 y.o. male presents to the clinic today for 3 year follow-up status post radiation therapy to his prostate and pelvic nodes for stage IIB Gleason 8 adenocarcinoma.  REFERRING PROVIDER: Guadalupe Maple, MD  HPI: patient is a 67 year old male now seen out 3 years having completed IM RT radiation therapy for stage IIB Gleason 8 (4+4) presenting the PSA of 6.3. Seen today in routine follow up is doing well. Specifically denies diarrhea dysuria or any other GI/GU complaints. His PSAs ticked up slightly from 0.05 last year to.0.37 recently.  COMPLICATIONS OF TREATMENT: none  FOLLOW UP COMPLIANCE: keeps appointments   PHYSICAL EXAM:  BP 117/73 (BP Location: Left Arm, Patient Position: Sitting)   Pulse 65   Temp 98 F (36.7 C) (Tympanic)   Wt 202 lb 7.9 oz (91.8 kg)   BMI 29.05 kg/m  Well-developed well-nourished patient in NAD. HEENT reveals PERLA, EOMI, discs not visualized.  Oral cavity is clear. No oral mucosal lesions are identified. Neck is clear without evidence of cervical or supraclavicular adenopathy. Lungs are clear to A&P. Cardiac examination is essentially unremarkable with regular rate and rhythm without murmur rub or thrill. Abdomen is benign with no organomegaly or masses noted. Motor sensory and DTR levels are equal and symmetric in the upper and lower extremities. Cranial nerves II through XII are grossly intact. Proprioception is intact. No peripheral adenopathy or edema is identified. No motor or sensory levels are noted. Crude visual fields are within normal range.  RADIOLOGY RESULTS: no current films for review  PLAN: at this time I believe we can continue to follow him on a yearly basis. Should we see incremental jump in his PSA may refer him to medical oncology or start him on initial pulse Lupron therapy. Patient Has my treatment plan well. Will see  him back in 1 year with a PSA.  I would like to take this opportunity to thank you for allowing me to participate in the care of your patient.Noreene Filbert, MD

## 2018-06-21 ENCOUNTER — Ambulatory Visit (INDEPENDENT_AMBULATORY_CARE_PROVIDER_SITE_OTHER): Payer: Medicare Other

## 2018-06-21 DIAGNOSIS — Z23 Encounter for immunization: Secondary | ICD-10-CM

## 2018-08-02 DIAGNOSIS — D1800 Hemangioma unspecified site: Secondary | ICD-10-CM | POA: Diagnosis not present

## 2018-08-02 DIAGNOSIS — L821 Other seborrheic keratosis: Secondary | ICD-10-CM | POA: Diagnosis not present

## 2018-08-02 DIAGNOSIS — L578 Other skin changes due to chronic exposure to nonionizing radiation: Secondary | ICD-10-CM | POA: Diagnosis not present

## 2018-08-02 DIAGNOSIS — L570 Actinic keratosis: Secondary | ICD-10-CM | POA: Diagnosis not present

## 2018-08-02 DIAGNOSIS — Z808 Family history of malignant neoplasm of other organs or systems: Secondary | ICD-10-CM | POA: Diagnosis not present

## 2018-08-25 ENCOUNTER — Other Ambulatory Visit: Payer: Self-pay | Admitting: Family Medicine

## 2018-08-25 MED ORDER — BENZONATATE 100 MG PO CAPS: 100.0000 mg | ORAL_CAPSULE | Freq: Two times a day (BID) | ORAL | 0 refills | Status: DC | PRN

## 2018-08-25 MED ORDER — AMOXICILLIN 875 MG PO TABS: 875.0000 mg | ORAL_TABLET | Freq: Two times a day (BID) | ORAL | 0 refills | Status: DC

## 2018-08-25 NOTE — Progress Notes (Signed)
Sinusitis

## 2018-09-02 DIAGNOSIS — H524 Presbyopia: Secondary | ICD-10-CM | POA: Diagnosis not present

## 2018-09-02 DIAGNOSIS — E119 Type 2 diabetes mellitus without complications: Secondary | ICD-10-CM | POA: Diagnosis not present

## 2018-09-02 DIAGNOSIS — H25813 Combined forms of age-related cataract, bilateral: Secondary | ICD-10-CM | POA: Diagnosis not present

## 2018-09-02 DIAGNOSIS — H401132 Primary open-angle glaucoma, bilateral, moderate stage: Secondary | ICD-10-CM | POA: Diagnosis not present

## 2018-09-02 LAB — HM DIABETES EYE EXAM

## 2018-09-27 DIAGNOSIS — H2513 Age-related nuclear cataract, bilateral: Secondary | ICD-10-CM | POA: Diagnosis not present

## 2018-09-27 DIAGNOSIS — H2512 Age-related nuclear cataract, left eye: Secondary | ICD-10-CM | POA: Diagnosis not present

## 2018-10-04 DIAGNOSIS — L853 Xerosis cutis: Secondary | ICD-10-CM | POA: Diagnosis not present

## 2018-10-04 DIAGNOSIS — L300 Nummular dermatitis: Secondary | ICD-10-CM | POA: Diagnosis not present

## 2018-10-11 DIAGNOSIS — H2512 Age-related nuclear cataract, left eye: Secondary | ICD-10-CM | POA: Diagnosis not present

## 2018-10-11 DIAGNOSIS — H25812 Combined forms of age-related cataract, left eye: Secondary | ICD-10-CM | POA: Diagnosis not present

## 2018-10-19 DIAGNOSIS — H2511 Age-related nuclear cataract, right eye: Secondary | ICD-10-CM | POA: Diagnosis not present

## 2018-10-25 ENCOUNTER — Ambulatory Visit (INDEPENDENT_AMBULATORY_CARE_PROVIDER_SITE_OTHER): Payer: Medicare Other

## 2018-10-25 VITALS — BP 132/70 | HR 74 | Temp 97.8°F | Resp 16 | Ht 70.0 in | Wt 202.0 lb

## 2018-10-25 DIAGNOSIS — Z0000 Encounter for general adult medical examination without abnormal findings: Secondary | ICD-10-CM | POA: Diagnosis not present

## 2018-10-25 DIAGNOSIS — E119 Type 2 diabetes mellitus without complications: Secondary | ICD-10-CM

## 2018-10-25 DIAGNOSIS — R5383 Other fatigue: Secondary | ICD-10-CM

## 2018-10-25 DIAGNOSIS — C61 Malignant neoplasm of prostate: Secondary | ICD-10-CM

## 2018-10-25 DIAGNOSIS — I10 Essential (primary) hypertension: Secondary | ICD-10-CM

## 2018-10-25 DIAGNOSIS — E785 Hyperlipidemia, unspecified: Secondary | ICD-10-CM

## 2018-10-25 NOTE — Patient Instructions (Signed)
Mr. Troy Christensen , Thank you for taking time to come for your Medicare Wellness Visit. I appreciate your ongoing commitment to your health goals. Please review the following plan we discussed and let me know if I can assist you in the future.   Screening recommendations/referrals: Colonoscopy: completed 01/10/2016 Recommended yearly ophthalmology/optometry visit for glaucoma screening and checkup Recommended yearly dental visit for hygiene and checkup  Vaccinations: Influenza vaccine: up to date  Pneumococcal vaccine: up to date  Tdap vaccine: up to date  Shingles vaccine: shingrix eligible, check with your insurance company for coverage   Advanced directives:Please bring a copy of your health care power of attorney and living will to the office at your convenience.  Conditions/risks identified: none   Next appointment: Follow up in one year for your annual wellness exam   Preventive Care 65 Years and Older, Male Preventive care refers to lifestyle choices and visits with your health care provider that can promote health and wellness. What does preventive care include?  A yearly physical exam. This is also called an annual well check.  Dental exams once or twice a year.  Routine eye exams. Ask your health care provider how often you should have your eyes checked.  Personal lifestyle choices, including:  Daily care of your teeth and gums.  Regular physical activity.  Eating a healthy diet.  Avoiding tobacco and drug use.  Limiting alcohol use.  Practicing safe sex.  Taking low doses of aspirin every day.  Taking vitamin and mineral supplements as recommended by your health care provider. What happens during an annual well check? The services and screenings done by your health care provider during your annual well check will depend on your age, overall health, lifestyle risk factors, and family history of disease. Counseling  Your health care provider may ask you questions  about your:  Alcohol use.  Tobacco use.  Drug use.  Emotional well-being.  Home and relationship well-being.  Sexual activity.  Eating habits.  History of falls.  Memory and ability to understand (cognition).  Work and work Statistician. Screening  You may have the following tests or measurements:  Height, weight, and BMI.  Blood pressure.  Lipid and cholesterol levels. These may be checked every 5 years, or more frequently if you are over 46 years old.  Skin check.  Lung cancer screening. You may have this screening every year starting at age 97 if you have a 30-pack-year history of smoking and currently smoke or have quit within the past 15 years.  Fecal occult blood test (FOBT) of the stool. You may have this test every year starting at age 53.  Flexible sigmoidoscopy or colonoscopy. You may have a sigmoidoscopy every 5 years or a colonoscopy every 10 years starting at age 68.  Prostate cancer screening. Recommendations will vary depending on your family history and other risks.  Hepatitis C blood test.  Hepatitis B blood test.  Sexually transmitted disease (STD) testing.  Diabetes screening. This is done by checking your blood sugar (glucose) after you have not eaten for a while (fasting). You may have this done every 1-3 years.  Abdominal aortic aneurysm (AAA) screening. You may need this if you are a current or former smoker.  Osteoporosis. You may be screened starting at age 39 if you are at high risk. Talk with your health care provider about your test results, treatment options, and if necessary, the need for more tests. Vaccines  Your health care provider may recommend certain vaccines,  such as:  Influenza vaccine. This is recommended every year.  Tetanus, diphtheria, and acellular pertussis (Tdap, Td) vaccine. You may need a Td booster every 10 years.  Zoster vaccine. You may need this after age 38.  Pneumococcal 13-valent conjugate (PCV13)  vaccine. One dose is recommended after age 29.  Pneumococcal polysaccharide (PPSV23) vaccine. One dose is recommended after age 37. Talk to your health care provider about which screenings and vaccines you need and how often you need them. This information is not intended to replace advice given to you by your health care provider. Make sure you discuss any questions you have with your health care provider. Document Released: 09/28/2015 Document Revised: 05/21/2016 Document Reviewed: 07/03/2015 Elsevier Interactive Patient Education  2017 [place] Prevention in the Home Falls can cause injuries. They can happen to people of all ages. There are many things you can do to make your home safe and to help prevent falls. What can I do on the outside of my home?  Regularly fix the edges of walkways and driveways and fix any cracks.  Remove anything that might make you trip as you walk through a door, such as a raised step or threshold.  Trim any bushes or trees on the path to your home.  Use bright outdoor lighting.  Clear any walking paths of anything that might make someone trip, such as rocks or tools.  Regularly check to see if handrails are loose or broken. Make sure that both sides of any steps have handrails.  Any raised decks and porches should have guardrails on the edges.  Have any leaves, snow, or ice cleared regularly.  Use sand or salt on walking paths during winter.  Clean up any spills in your garage right away. This includes oil or grease spills. What can I do in the bathroom?  Use night lights.  Install grab bars by the toilet and in the tub and shower. Do not use towel bars as grab bars.  Use non-skid mats or decals in the tub or shower.  If you need to sit down in the shower, use a plastic, non-slip stool.  Keep the floor dry. Clean up any water that spills on the floor as soon as it happens.  Remove soap buildup in the tub or shower  regularly.  Attach bath mats securely with double-sided non-slip rug tape.  Do not have throw rugs and other things on the floor that can make you trip. What can I do in the bedroom?  Use night lights.  Make sure that you have a light by your bed that is easy to reach.  Do not use any sheets or blankets that are too big for your bed. They should not hang down onto the floor.  Have a firm chair that has side arms. You can use this for support while you get dressed.  Do not have throw rugs and other things on the floor that can make you trip. What can I do in the kitchen?  Clean up any spills right away.  Avoid walking on wet floors.  Keep items that you use a lot in easy-to-reach places.  If you need to reach something above you, use a strong step stool that has a grab bar.  Keep electrical cords out of the way.  Do not use floor polish or wax that makes floors slippery. If you must use wax, use non-skid floor wax.  Do not have throw rugs and other things on  the floor that can make you trip. What can I do with my stairs?  Do not leave any items on the stairs.  Make sure that there are handrails on both sides of the stairs and use them. Fix handrails that are broken or loose. Make sure that handrails are as long as the stairways.  Check any carpeting to make sure that it is firmly attached to the stairs. Fix any carpet that is loose or worn.  Avoid having throw rugs at the top or bottom of the stairs. If you do have throw rugs, attach them to the floor with carpet tape.  Make sure that you have a light switch at the top of the stairs and the bottom of the stairs. If you do not have them, ask someone to add them for you. What else can I do to help prevent falls?  Wear shoes that:  Do not have high heels.  Have rubber bottoms.  Are comfortable and fit you well.  Are closed at the toe. Do not wear sandals.  If you use a stepladder:  Make sure that it is fully  opened. Do not climb a closed stepladder.  Make sure that both sides of the stepladder are locked into place.  Ask someone to hold it for you, if possible.  Clearly mark and make sure that you can see:  Any grab bars or handrails.  First and last steps.  Where the edge of each step is.  Use tools that help you move around (mobility aids) if they are needed. These include:  Canes.  Walkers.  Scooters.  Crutches.  Turn on the lights when you go into a dark area. Replace any light bulbs as soon as they burn out.  Set up your furniture so you have a clear path. Avoid moving your furniture around.  If any of your floors are uneven, fix them.  If there are any pets around you, be aware of where they are.  Review your medicines with your doctor. Some medicines can make you feel dizzy. This can increase your chance of falling. Ask your doctor what other things that you can do to help prevent falls. This information is not intended to replace advice given to you by your health care provider. Make sure you discuss any questions you have with your health care provider. Document Released: 06/28/2009 Document Revised: 02/07/2016 Document Reviewed: 10/06/2014 Elsevier Interactive Patient Education  2017 [employer].

## 2018-10-25 NOTE — Progress Notes (Addendum)
Subjective:   Troy Christensen is a 68 y.o. male who presents for Medicare Annual/Subsequent preventive examination. I, as the supervising physician, have reviewed the nurse health advisor's Medicare wellness visit note for this patient and concur with the findings and recommendations listed above. Golden Pop, MD Review of Systems:   Cardiac Risk Factors include: advanced age (>87men, >32 women);diabetes mellitus;hypertension;dyslipidemia     Objective:    Vitals: BP 132/70 (BP Location: Left Arm, Patient Position: Sitting, Cuff Size: Normal)   Pulse 74   Temp 97.8 F (36.6 C) (Temporal)   Resp 16   Ht 5\' 10"  (1.778 m)   Wt 202 lb (91.6 kg)   BMI 28.98 kg/m   Body mass index is 28.98 kg/m.  Advanced Directives 10/25/2018 06/14/2018 10/15/2017 05/28/2017 01/10/2016 12/17/2015 02/13/2015  Does Patient Have a Medical Advance Directive? Yes No Yes No Yes No Yes  Type of Advance Directive Living will;Healthcare Power of [place];Living will - Living will;Healthcare Power of [place]  Does patient want to make changes to medical advance directive? - - - - - - No - Patient declined  Copy of [place] in Chart? No - copy requested - No - copy requested - No - copy requested - No - copy requested  Would patient like information on creating a medical advance directive? - No - Patient declined - No - Patient declined - - -    Tobacco Social History   Tobacco Use  Smoking Status Former Smoker  . Types: Cigarettes  . Last attempt to quit: 03/26/1975  . Years since quitting: 43.6  Smokeless Tobacco Never Used     Counseling given: Not Answered   Clinical Intake:  Pre-visit preparation completed: Yes  Pain : No/denies pain     Nutritional Status: BMI 25 -29 Overweight Nutritional Risks: None Diabetes: No  How often do you need to have someone help you when you read instructions, pamphlets, or other  written materials from your doctor or pharmacy?: 1 - Never  Interpreter Needed?: No  Information entered by :: Tiffany Hill,LPN   Past Medical History:  Diagnosis Date  . Cancer [facility])    Prostate  . Diabetes mellitus without complication ([place])   . Glaucoma   . Gout   . Hypercholesteremia   . Hypertension   . Osteoarthritis    hands  . Prostate cancer [facility])    Past Surgical History:  Procedure Laterality Date  . FLEXIBLE SIGMOIDOSCOPY N/A 01/10/2016   Procedure: FLEXIBLE SIGMOIDOSCOPY with  argon plasma coagulation;  Surgeon: Lucilla Lame, MD;  Location: [place];  Service: Endoscopy;  Laterality: N/A;  Diabetic - oral meds  . HERNIA REPAIR    . JOINT REPLACEMENT  2014   Right Knee Replacement  . MENISCUS REPAIR Left   . nephrolithiasis     pt denies  . PROSTATE SURGERY     Radiation treatments - no surgery  . ROTATOR CUFF REPAIR Right 2012   Family History  Problem Relation Age of Onset  . Cancer Father 68       lung  . Emphysema Mother   . Dementia Mother   . Cancer Brother        melanoma   Social History   Socioeconomic History  . Marital status: Married    Spouse name: Not on file  . Number of children: Not on file  . Years of education: Not on file  .  Highest education level: Not on file  Occupational History  . Occupation: retired  Scientific laboratory technician  . Financial resource strain: Not hard at all  . Food insecurity:    Worry: Never true    Inability: Never true  . Transportation needs:    Medical: No    Non-medical: No  Tobacco Use  . Smoking status: Former Smoker    Types: Cigarettes    Last attempt to quit: 03/26/1975    Years since quitting: 43.6  . Smokeless tobacco: Never Used  Substance and Sexual Activity  . Alcohol use: No    Alcohol/week: 0.0 standard drinks  . Drug use: No  . Sexual activity: Not on file  Lifestyle  . Physical activity:    Days per week: 0 days    Minutes per session: 0 min  . Stress: Not at all    Relationships  . Social connections:    Talks on phone: More than three times a week    Gets together: More than three times a week    Attends religious service: More than 4 times per year    Active member of club or organization: Yes    Attends meetings of clubs or organizations: 1 to 4 times per year    Relationship status: Married  Other Topics Concern  . Not on file  Social History Narrative  . Not on file    Outpatient Encounter Medications as of 10/25/2018  Medication Sig  . allopurinol (ZYLOPRIM) 100 MG tablet TAKE 1 TABLET DAILY  . amLODipine-benazepril (LOTREL) 5-20 MG capsule TAKE 2 CAPSULES ONCE EACH DAY  . aspirin 81 MG tablet Take 81 mg by mouth daily. Reported on 11/20/2015  . dapagliflozin propanediol (FARXIGA) 10 MG TABS tablet Take 10 mg by mouth daily.  . DOCOSAHEXAENOIC ACID PO Take by mouth.  Marland Kitchen glucose blood test strip 1 each by Other route as needed for other. Use contour next test strip to check sugar 2 times daily  . LOTEMAX SM 0.38 % GEL   . lovastatin (MEVACOR) 40 MG tablet TAKE 1 TABLET DAILY  . meloxicam (MOBIC) 15 MG tablet Take 1 tablet (15 mg total) by mouth daily.  . metFORMIN (GLUCOPHAGE-XR) 500 MG 24 hr tablet Take 2 tablets (1,000 mg total) by mouth 2 (two) times daily.  . pioglitazone (ACTOS) 45 MG tablet TAKE 1 TABLET DAILY  . PROLENSA 0.07 % SOLN INSTILL 1 DROP INTO LEFT EYE ONCE DAILY; START 1 DAY BEFORE SURGERY  . sildenafil (REVATIO) 20 MG tablet Take 1-5 tablets (20-100 mg total) by mouth as needed.  . sitaGLIPtin (JANUVIA) 100 MG tablet Take 1 tablet (100 mg total) by mouth daily.  . timolol (TIMOPTIC) 0.5 % ophthalmic solution 1 drop 2 (two) times daily.  . [DISCONTINUED] amoxicillin (AMOXIL) 875 MG tablet Take 1 tablet (875 mg total) by mouth 2 (two) times daily. (Patient not taking: Reported on 10/25/2018)  . [DISCONTINUED] benzonatate (TESSALON) 100 MG capsule Take 1 capsule (100 mg total) by mouth 2 (two) times daily as needed for cough.  (Patient not taking: Reported on 10/25/2018)   No facility-administered encounter medications on file as of 10/25/2018.     Activities of Daily Living In your present state of health, do you have any difficulty performing the following activities: 10/25/2018  Hearing? N  Vision? N  Comment having catract surgery   Difficulty concentrating or making decisions? N  Walking or climbing stairs? N  Dressing or bathing? N  Doing errands, shopping? N  Preparing Food and eating ? N  Using the Toilet? N  In the past six months, have you accidently leaked urine? N  Do you have problems with loss of bowel control? N  Managing your Medications? N  Managing your Finances? N  Housekeeping or managing your Housekeeping? N  Some recent data might be hidden    Patient Care Team: Guadalupe Maple, MD as PCP - General (Family Medicine)   Assessment:   This is a routine wellness examination for [place].  Exercise Activities and Dietary recommendations Current Exercise Habits: The patient has a physically strenuous job, but has no regular exercise apart from work.(some lifting with work ), Exercise limited by: None identified  Goals    . DIET - INCREASE WATER INTAKE     recommend drinking at least 6-8 glasses of water a day         Fall Risk Fall Risk  10/25/2018 01/13/2018 01/11/2018 10/15/2017 07/31/2017  Falls in the past year? 0 No No No No  Number falls in past yr: 0 - - - -   FALL RISK PREVENTION PERTAINING TO THE HOME:  Any stairs in or around the home WITH handrails? No  stairs  Home free of loose throw rugs in walkways, pet beds, electrical cords, etc? Yes  Adequate lighting in your home to reduce risk of falls? Yes   ASSISTIVE DEVICES UTILIZED TO PREVENT FALLS:  Life alert? No  Use of a cane, walker or w/c? No  Grab bars in the bathroom? No  Shower chair or bench in shower? No  Elevated toilet seat or a handicapped toilet? No   DME ORDERS:  DME order needed?  No   TIMED UP AND  GO:  Was the test performed? no  GAIT:  Appearance of gait: Gait stead-fast without the use of an assistive device.  Education: Fall risk prevention has been discussed.  Intervention(s) required? No    Depression Screen PHQ 2/9 Scores 10/25/2018 01/11/2018 10/15/2017 07/14/2017  PHQ - 2 Score 0 0 0 0  PHQ- 9 Score - - - -    Cognitive Function     6CIT Screen 10/25/2018 10/15/2017  What Year? 0 points 0 points  What month? 0 points 0 points  What time? 0 points 0 points  Count back from 20 0 points 0 points  Months in reverse 0 points 0 points  Repeat phrase 0 points 0 points  Total Score 0 0    Immunization History  Administered Date(s) Administered  . Influenza, High Dose Seasonal PF 07/21/2016, 07/17/2017, 06/21/2018  . Influenza,inj,Quad PF,6+ Mos 06/26/2015  . Influenza,inj,quad, With Preservative 06/15/2016  . Pneumococcal Conjugate-13 09/24/2016  . Pneumococcal Polysaccharide-23 10/15/2017  . Tdap 08/19/2011  . Zoster 10/05/2013    Qualifies for Shingles Vaccine? Yes  Zostavax completed 10/05/2013. Due for Shingrix. Education has been provided regarding the importance of this vaccine. Pt has been advised to call insurance company to determine out of pocket expense. Advised may also receive vaccine at local pharmacy or Health Dept. Verbalized acceptance and understanding.  Tdap: up to date  Flu Vaccine: up to date  Pneumococcal Vaccine: up to date   Screening Tests Health Maintenance  Topic Date Due  . FOOT EXAM  11/19/2016  . HEMOGLOBIN A1C  10/28/2018  . OPHTHALMOLOGY EXAM  09/03/2019  . TETANUS/TDAP  08/18/2021  . COLONOSCOPY  12/20/2024  . INFLUENZA VACCINE  Completed  . Hepatitis C Screening  Completed  . PNA vac Low Risk Adult  Completed   Cancer Screenings:  Colorectal Screening: completed 01/10/2016  Lung Cancer Screening: (Low Dose CT Chest recommended if Age 20-80 years, 30 pack-year currently smoking OR have quit w/in 15years.) does not  qualify.    Additional Screening:  Hepatitis C Screening: does qualify; Completed 10/15/2017  Vision Screening: Recommended annual ophthalmology exams for early detection of glaucoma and other disorders of the eye. Is the patient up to date with their annual eye exam?  Yes  Who is the provider or what is the name of the office in which the pt attends annual eye exams? [facility]   Dental Screening: Recommended annual dental exams for proper oral hygiene  Community Resource Referral:  CRR required this visit?  No       Plan:    I have personally reviewed and addressed the Medicare Annual Wellness questionnaire and have noted the following in the patient's chart:  A. Medical and social history B. Use of alcohol, tobacco or illicit drugs  C. Current medications and supplements D. Functional ability and status E.  Nutritional status F.  Physical activity G. Advance directives H. List of other physicians I.  Hospitalizations, surgeries, and ER visits in previous 12 months J.  [place] such as hearing and vision if needed, cognitive and depression L. Referrals and appointments   In addition, I have reviewed and discussed with patient certain preventive protocols, quality metrics, and best practice recommendations. A written personalized care plan for preventive services as well as general preventive health recommendations were provided to patient.   Signed,  Tyler Aas, LPN Nurse Health Advisor   Nurse Notes: patient requested labs to be done at 8:30am tomorrow before his 9:00am CPE with Dr.Crissman. labs placed in chart for tomorrow.   Due for diabetic foot exam.

## 2018-10-26 ENCOUNTER — Ambulatory Visit (INDEPENDENT_AMBULATORY_CARE_PROVIDER_SITE_OTHER): Payer: Medicare Other | Admitting: Family Medicine

## 2018-10-26 ENCOUNTER — Other Ambulatory Visit: Payer: Medicare Other

## 2018-10-26 ENCOUNTER — Encounter: Payer: Self-pay | Admitting: Family Medicine

## 2018-10-26 DIAGNOSIS — M1A1790 Lead-induced chronic gout, unspecified ankle and foot, without tophus (tophi): Secondary | ICD-10-CM | POA: Diagnosis not present

## 2018-10-26 DIAGNOSIS — E1169 Type 2 diabetes mellitus with other specified complication: Secondary | ICD-10-CM

## 2018-10-26 DIAGNOSIS — I10 Essential (primary) hypertension: Secondary | ICD-10-CM

## 2018-10-26 DIAGNOSIS — E669 Obesity, unspecified: Secondary | ICD-10-CM

## 2018-10-26 DIAGNOSIS — E785 Hyperlipidemia, unspecified: Secondary | ICD-10-CM | POA: Diagnosis not present

## 2018-10-26 DIAGNOSIS — E1159 Type 2 diabetes mellitus with other circulatory complications: Secondary | ICD-10-CM | POA: Diagnosis not present

## 2018-10-26 DIAGNOSIS — T560X1D Toxic effect of lead and its compounds, accidental (unintentional), subsequent encounter: Secondary | ICD-10-CM

## 2018-10-26 DIAGNOSIS — E119 Type 2 diabetes mellitus without complications: Secondary | ICD-10-CM

## 2018-10-26 DIAGNOSIS — Z7189 Other specified counseling: Secondary | ICD-10-CM

## 2018-10-26 DIAGNOSIS — E7800 Pure hypercholesterolemia, unspecified: Secondary | ICD-10-CM

## 2018-10-26 DIAGNOSIS — Z0000 Encounter for general adult medical examination without abnormal findings: Secondary | ICD-10-CM | POA: Diagnosis not present

## 2018-10-26 DIAGNOSIS — C61 Malignant neoplasm of prostate: Secondary | ICD-10-CM

## 2018-10-26 DIAGNOSIS — R5383 Other fatigue: Secondary | ICD-10-CM

## 2018-10-26 LAB — URINALYSIS, ROUTINE W REFLEX MICROSCOPIC
Bilirubin, UA: NEGATIVE
Ketones, UA: NEGATIVE
Leukocytes, UA: NEGATIVE
Nitrite, UA: NEGATIVE
Protein, UA: NEGATIVE
RBC, UA: NEGATIVE
Specific Gravity, UA: 1.025 (ref 1.005–1.030)
Urobilinogen, Ur: 0.2 mg/dL (ref 0.2–1.0)
pH, UA: 5 (ref 5.0–7.5)

## 2018-10-26 LAB — BAYER DCA HB A1C WAIVED: HB A1C (BAYER DCA - WAIVED): 6.8 % (ref <7.0)

## 2018-10-26 MED ORDER — PIOGLITAZONE HCL 45 MG PO TABS: 45.0000 mg | ORAL_TABLET | Freq: Every day | ORAL | 4 refills | Status: DC

## 2018-10-26 MED ORDER — MELOXICAM 15 MG PO TABS: 15.0000 mg | ORAL_TABLET | Freq: Every day | ORAL | 4 refills | Status: DC

## 2018-10-26 MED ORDER — METFORMIN HCL ER 500 MG PO TB24: 1000.0000 mg | ORAL_TABLET | Freq: Two times a day (BID) | ORAL | 4 refills | Status: DC

## 2018-10-26 MED ORDER — DAPAGLIFLOZIN PROPANEDIOL 10 MG PO TABS: 10.0000 mg | ORAL_TABLET | Freq: Every day | ORAL | 4 refills | Status: DC

## 2018-10-26 MED ORDER — ALLOPURINOL 100 MG PO TABS: 100.0000 mg | ORAL_TABLET | Freq: Every day | ORAL | 4 refills | Status: DC

## 2018-10-26 MED ORDER — AMLODIPINE BESY-BENAZEPRIL HCL 5-20 MG PO CAPS: ORAL_CAPSULE | ORAL | 4 refills | Status: DC

## 2018-10-26 MED ORDER — SITAGLIPTIN PHOSPHATE 100 MG PO TABS: 100.0000 mg | ORAL_TABLET | Freq: Every day | ORAL | 4 refills | Status: DC

## 2018-10-26 MED ORDER — LOVASTATIN 40 MG PO TABS: 40.0000 mg | ORAL_TABLET | Freq: Every day | ORAL | 4 refills | Status: DC

## 2018-10-26 NOTE — Assessment & Plan Note (Signed)
The current medical regimen is effective;  continue present plan and medications.  

## 2018-10-26 NOTE — Assessment & Plan Note (Signed)
A voluntary discussion about advanced care planning including explanation and discussion of advanced directives was extentively discussed with the patient.  Explained about the healthcare proxy and living will was reviewed and packet with forms with expiration of how to fill them out was given.  Time spent: Encounter 16+ min individuals present: Patient 

## 2018-10-26 NOTE — Assessment & Plan Note (Signed)
Followed by radiation

## 2018-10-26 NOTE — Progress Notes (Signed)
BP (!) 110/50 (BP Location: Left Arm)   Pulse 64   Temp 98.5 F (36.9 C) (Oral)   Ht 5\' 10"  (1.778 m)   Wt 202 lb (91.6 kg)   SpO2 98%   BMI 28.98 kg/m    Subjective:    Patient ID: Troy Christensen, male    DOB: 1951/06/25, 68 y.o.   MRN: [id number]  HPI: Troy Christensen is a 68 y.o. male  Chief Complaint  Patient presents with  . Diabetes  . Hyperlipidemia  . Hypertension   Annual exam Patient all in all doing well no complaints. Followed by radiation for prostate cancer and PSA measured last September was 0.37.  No need to check again today as he is on yearly cycles. Diabetes no complaints no low blood sugar spells blood pressure good control no complaints from medications. Cholesterol also doing well with no complaints from medications. No symptoms of gout  Relevant past medical, surgical, family and social history reviewed and updated as indicated. Interim medical history since our last visit reviewed. Allergies and medications reviewed and updated.  Review of Systems  Constitutional: Negative.   HENT: Negative.   Eyes: Negative.   Respiratory: Negative.   Cardiovascular: Negative.   Gastrointestinal: Negative.   Endocrine: Negative.   Genitourinary: Negative.   Musculoskeletal: Negative.   Skin: Negative.   Allergic/Immunologic: Negative.   Neurological: Negative.   Hematological: Negative.   Psychiatric/Behavioral: Negative.     Per HPI unless specifically indicated above     Objective:    BP (!) 110/50 (BP Location: Left Arm)   Pulse 64   Temp 98.5 F (36.9 C) (Oral)   Ht 5\' 10"  (1.778 m)   Wt 202 lb (91.6 kg)   SpO2 98%   BMI 28.98 kg/m   Wt Readings from Last 3 Encounters:  10/26/18 202 lb (91.6 kg)  10/25/18 202 lb (91.6 kg)  06/14/18 202 lb 7.9 oz (91.8 kg)    Physical Exam Constitutional:      Appearance: He is well-developed.  HENT:     Head: Normocephalic.     Right Ear: External ear normal.     Left Ear: External ear  normal.     Nose: Nose normal.  Eyes:     Conjunctiva/sclera: Conjunctivae normal.     Pupils: Pupils are equal, round, and reactive to light.  Neck:     Musculoskeletal: Normal range of motion and neck supple.     Thyroid: No thyromegaly.  Cardiovascular:     Rate and Rhythm: Normal rate and regular rhythm.     Heart sounds: Normal heart sounds.  Pulmonary:     Effort: Pulmonary effort is normal.     Breath sounds: Normal breath sounds.  Abdominal:     General: Bowel sounds are normal.     Palpations: Abdomen is soft. There is no hepatomegaly or splenomegaly.  Genitourinary:    Comments: Done at urology Musculoskeletal: Normal range of motion.  Lymphadenopathy:     Cervical: No cervical adenopathy.  Skin:    General: Skin is warm and dry.  Neurological:     Mental Status: He is alert and oriented to person, place, and time.     Deep Tendon Reflexes: Reflexes are normal and symmetric.  Psychiatric:        Behavior: Behavior normal.        Thought Content: Thought content normal.        Judgment: Judgment normal.     Results for  orders placed or performed in visit on 09/03/18  HM DIABETES EYE EXAM  Result Value Ref Range   HM Diabetic Eye Exam No Retinopathy No Retinopathy      Assessment & Plan:   Problem List Items Addressed This Visit      Cardiovascular and Mediastinum   Obesity, diabetes, and hypertension syndrome ([place])    The current medical regimen is effective;  continue present plan and medications.       Relevant Medications   lovastatin (MEVACOR) 40 MG tablet   pioglitazone (ACTOS) 45 MG tablet   amLODipine-benazepril (LOTREL) 5-20 MG capsule   metFORMIN (GLUCOPHAGE-XR) 500 MG 24 hr tablet   dapagliflozin propanediol (FARXIGA) 10 MG TABS tablet   sitaGLIPtin (JANUVIA) 100 MG tablet   Hypertension    The current medical regimen is effective;  continue present plan and medications.       Relevant Medications   lovastatin (MEVACOR) 40 MG tablet    amLODipine-benazepril (LOTREL) 5-20 MG capsule     Genitourinary   Prostate cancer ([place])    Followed by radiation       Relevant Medications   allopurinol (ZYLOPRIM) 100 MG tablet     Other   Hyperlipidemia    The current medical regimen is effective;  continue present plan and medications.       Relevant Medications   lovastatin (MEVACOR) 40 MG tablet   amLODipine-benazepril (LOTREL) 5-20 MG capsule   Gout    The current medical regimen is effective;  continue present plan and medications.       Relevant Medications   allopurinol (ZYLOPRIM) 100 MG tablet   Other Relevant Orders   Uric acid   Advanced care planning/counseling discussion    A voluntary discussion about advanced care planning including explanation and discussion of advanced directives was extentively discussed with the patient.  Explained about the healthcare proxy and living will was reviewed and packet with forms with expiration of how to fill them out was given.  Time spent: Encounter 16+ min individuals present: Patient          Follow up plan: Return in about 6 months (around 04/26/2019) for Hemoglobin A1c, BMP,  Lipids, ALT, AST.

## 2018-10-28 LAB — CBC WITH DIFFERENTIAL/PLATELET
BASOS ABS: 0 10*3/uL (ref 0.0–0.2)
Basos: 1 %
EOS (ABSOLUTE): 0.2 10*3/uL (ref 0.0–0.4)
Eos: 5 %
Hematocrit: 40.4 % (ref 37.5–51.0)
Hemoglobin: 13.8 g/dL (ref 13.0–17.7)
Immature Grans (Abs): 0 10*3/uL (ref 0.0–0.1)
Immature Granulocytes: 0 %
Lymphocytes Absolute: 1 10*3/uL (ref 0.7–3.1)
Lymphs: 29 %
MCH: 32.2 pg (ref 26.6–33.0)
MCHC: 34.2 g/dL (ref 31.5–35.7)
MCV: 94 fL (ref 79–97)
Monocytes Absolute: 0.3 10*3/uL (ref 0.1–0.9)
Monocytes: 10 %
NEUTROS PCT: 55 %
Neutrophils Absolute: 1.9 10*3/uL (ref 1.4–7.0)
Platelets: 183 10*3/uL (ref 150–450)
RBC: 4.28 x10E6/uL (ref 4.14–5.80)
RDW: 13.6 % (ref 11.6–15.4)
WBC: 3.4 10*3/uL (ref 3.4–10.8)

## 2018-10-28 LAB — COMPREHENSIVE METABOLIC PANEL
ALT: 27 IU/L (ref 0–44)
AST: 25 IU/L (ref 0–40)
Albumin/Globulin Ratio: 2.2 (ref 1.2–2.2)
Albumin: 4.7 g/dL (ref 3.8–4.8)
Alkaline Phosphatase: 52 IU/L (ref 39–117)
BUN/Creatinine Ratio: 15 (ref 10–24)
BUN: 17 mg/dL (ref 8–27)
Bilirubin Total: 0.3 mg/dL (ref 0.0–1.2)
CO2: 21 mmol/L (ref 20–29)
CREATININE: 1.15 mg/dL (ref 0.76–1.27)
Calcium: 10 mg/dL (ref 8.6–10.2)
Chloride: 102 mmol/L (ref 96–106)
GFR calc Af Amer: 76 mL/min/{1.73_m2} (ref >59)
GFR, EST NON AFRICAN AMERICAN: 65 mL/min/{1.73_m2} (ref >59)
GLUCOSE: 112 mg/dL — AB (ref 65–99)
Globulin, Total: 2.1 g/dL (ref 1.5–4.5)
Potassium: 4.9 mmol/L (ref 3.5–5.2)
Sodium: 141 mmol/L (ref 134–144)
Total Protein: 6.8 g/dL (ref 6.0–8.5)

## 2018-10-28 LAB — TSH: TSH: 1.8 u[IU]/mL (ref 0.450–4.500)

## 2018-10-28 LAB — LIPID PANEL
Chol/HDL Ratio: 3.3 ratio (ref 0.0–5.0)
Cholesterol, Total: 189 mg/dL (ref 100–199)
HDL: 57 mg/dL (ref >39)
LDL CALC: 93 mg/dL (ref 0–99)
Triglycerides: 195 mg/dL — ABNORMAL HIGH (ref 0–149)
VLDL Cholesterol Cal: 39 mg/dL (ref 5–40)

## 2018-10-28 LAB — URIC ACID: URIC ACID: 5.7 mg/dL (ref 3.7–8.6)

## 2018-10-28 LAB — PSA: Prostate Specific Ag, Serum: 0.6 ng/mL (ref 0.0–4.0)

## 2018-11-01 DIAGNOSIS — H2511 Age-related nuclear cataract, right eye: Secondary | ICD-10-CM | POA: Diagnosis not present

## 2018-11-01 DIAGNOSIS — H25811 Combined forms of age-related cataract, right eye: Secondary | ICD-10-CM | POA: Diagnosis not present

## 2019-01-06 DIAGNOSIS — M25661 Stiffness of right knee, not elsewhere classified: Secondary | ICD-10-CM | POA: Diagnosis not present

## 2019-01-06 DIAGNOSIS — M25561 Pain in right knee: Secondary | ICD-10-CM | POA: Diagnosis not present

## 2019-01-06 DIAGNOSIS — Z96651 Presence of right artificial knee joint: Secondary | ICD-10-CM | POA: Diagnosis not present

## 2019-01-06 DIAGNOSIS — M25551 Pain in right hip: Secondary | ICD-10-CM | POA: Diagnosis not present

## 2019-01-31 DIAGNOSIS — M791 Myalgia, unspecified site: Secondary | ICD-10-CM | POA: Diagnosis not present

## 2019-01-31 DIAGNOSIS — M4727 Other spondylosis with radiculopathy, lumbosacral region: Secondary | ICD-10-CM | POA: Diagnosis not present

## 2019-02-03 DIAGNOSIS — M545 Low back pain: Secondary | ICD-10-CM | POA: Diagnosis not present

## 2019-02-08 DIAGNOSIS — M545 Low back pain: Secondary | ICD-10-CM | POA: Diagnosis not present

## 2019-02-10 DIAGNOSIS — M545 Low back pain: Secondary | ICD-10-CM | POA: Diagnosis not present

## 2019-02-15 DIAGNOSIS — M545 Low back pain: Secondary | ICD-10-CM | POA: Diagnosis not present

## 2019-02-18 DIAGNOSIS — M545 Low back pain: Secondary | ICD-10-CM | POA: Diagnosis not present

## 2019-02-21 DIAGNOSIS — M4727 Other spondylosis with radiculopathy, lumbosacral region: Secondary | ICD-10-CM | POA: Diagnosis not present

## 2019-02-22 DIAGNOSIS — M545 Low back pain: Secondary | ICD-10-CM | POA: Diagnosis not present

## 2019-02-24 DIAGNOSIS — M545 Low back pain: Secondary | ICD-10-CM | POA: Diagnosis not present

## 2019-03-01 DIAGNOSIS — M545 Low back pain: Secondary | ICD-10-CM | POA: Diagnosis not present

## 2019-03-03 DIAGNOSIS — H401132 Primary open-angle glaucoma, bilateral, moderate stage: Secondary | ICD-10-CM | POA: Diagnosis not present

## 2019-03-03 DIAGNOSIS — H26492 Other secondary cataract, left eye: Secondary | ICD-10-CM | POA: Diagnosis not present

## 2019-03-03 DIAGNOSIS — E119 Type 2 diabetes mellitus without complications: Secondary | ICD-10-CM | POA: Diagnosis not present

## 2019-03-07 ENCOUNTER — Encounter: Payer: Self-pay | Admitting: Nurse Practitioner

## 2019-03-07 ENCOUNTER — Ambulatory Visit (INDEPENDENT_AMBULATORY_CARE_PROVIDER_SITE_OTHER): Payer: Medicare Other | Admitting: Nurse Practitioner

## 2019-03-07 ENCOUNTER — Other Ambulatory Visit: Payer: Self-pay

## 2019-03-07 VITALS — BP 143/83 | HR 82 | Temp 98.5°F | Wt 192.4 lb

## 2019-03-07 DIAGNOSIS — R5381 Other malaise: Secondary | ICD-10-CM | POA: Diagnosis not present

## 2019-03-07 DIAGNOSIS — Z7984 Long term (current) use of oral hypoglycemic drugs: Secondary | ICD-10-CM | POA: Diagnosis not present

## 2019-03-07 DIAGNOSIS — E860 Dehydration: Secondary | ICD-10-CM | POA: Insufficient documentation

## 2019-03-07 DIAGNOSIS — D72819 Decreased white blood cell count, unspecified: Secondary | ICD-10-CM | POA: Diagnosis not present

## 2019-03-07 DIAGNOSIS — E119 Type 2 diabetes mellitus without complications: Secondary | ICD-10-CM | POA: Diagnosis not present

## 2019-03-07 DIAGNOSIS — Z7982 Long term (current) use of aspirin: Secondary | ICD-10-CM | POA: Diagnosis not present

## 2019-03-07 DIAGNOSIS — Z923 Personal history of irradiation: Secondary | ICD-10-CM | POA: Diagnosis not present

## 2019-03-07 DIAGNOSIS — Z8546 Personal history of malignant neoplasm of prostate: Secondary | ICD-10-CM | POA: Diagnosis not present

## 2019-03-07 DIAGNOSIS — I444 Left anterior fascicular block: Secondary | ICD-10-CM | POA: Diagnosis not present

## 2019-03-07 DIAGNOSIS — Z87891 Personal history of nicotine dependence: Secondary | ICD-10-CM | POA: Diagnosis not present

## 2019-03-07 DIAGNOSIS — D649 Anemia, unspecified: Secondary | ICD-10-CM | POA: Diagnosis not present

## 2019-03-07 DIAGNOSIS — R112 Nausea with vomiting, unspecified: Secondary | ICD-10-CM | POA: Diagnosis not present

## 2019-03-07 DIAGNOSIS — U071 COVID-19: Secondary | ICD-10-CM | POA: Diagnosis not present

## 2019-03-07 DIAGNOSIS — R531 Weakness: Secondary | ICD-10-CM | POA: Diagnosis not present

## 2019-03-07 DIAGNOSIS — I10 Essential (primary) hypertension: Secondary | ICD-10-CM | POA: Diagnosis not present

## 2019-03-07 DIAGNOSIS — I499 Cardiac arrhythmia, unspecified: Secondary | ICD-10-CM | POA: Diagnosis not present

## 2019-03-07 LAB — UA/M W/RFLX CULTURE, ROUTINE
Bilirubin, UA: NEGATIVE
Leukocytes,UA: NEGATIVE
Nitrite, UA: NEGATIVE
Specific Gravity, UA: 1.015 (ref 1.005–1.030)
Urobilinogen, Ur: 0.2 mg/dL (ref 0.2–1.0)
pH, UA: 5.5 (ref 5.0–7.5)

## 2019-03-07 LAB — LIPID PANEL PICCOLO, WAIVED
Chol/HDL Ratio Piccolo,Waive: 3.4 mg/dL
Cholesterol Piccolo, Waived: 169 mg/dL (ref <200)
HDL Chol Piccolo, Waived: 49 mg/dL — ABNORMAL LOW (ref >59)
LDL Chol Calc Piccolo Waived: 84 mg/dL (ref <100)
Triglycerides Piccolo,Waived: 180 mg/dL — ABNORMAL HIGH (ref <150)
VLDL Chol Calc Piccolo,Waive: 36 mg/dL — ABNORMAL HIGH (ref <30)

## 2019-03-07 LAB — MICROSCOPIC EXAMINATION: Bacteria, UA: NONE SEEN

## 2019-03-07 LAB — BAYER DCA HB A1C WAIVED: HB A1C (BAYER DCA - WAIVED): 7.4 % — ABNORMAL HIGH (ref <7.0)

## 2019-03-07 NOTE — Patient Instructions (Signed)
Abdominal Pain, Adult  Abdominal pain can be caused by many things. Often, abdominal pain is not serious and it gets better with no treatment or by being treated at home. However, sometimes abdominal pain is serious. Your health care provider will do a medical history and a physical exam to try to determine the cause of your abdominal pain.  Follow these instructions at home:   Take over-the-counter and prescription medicines only as told by your health care provider. Do not take a laxative unless told by your health care provider.   Drink enough fluid to keep your urine clear or pale yellow.   Watch your condition for any changes.   Keep all follow-up visits as told by your health care provider. This is important.  Contact a health care provider if:   Your abdominal pain changes or gets worse.   You are not hungry or you lose weight without trying.   You are constipated or have diarrhea for more than 2-3 days.   You have pain when you urinate or have a bowel movement.   Your abdominal pain wakes you up at night.   Your pain gets worse with meals, after eating, or with certain foods.   You are throwing up and cannot keep anything down.   You have a fever.  Get help right away if:   Your pain does not go away as soon as your health care provider told you to expect.   You cannot stop throwing up.   Your pain is only in areas of the abdomen, such as the right side or the left lower portion of the abdomen.   You have bloody or black stools, or stools that look like tar.   You have severe pain, cramping, or bloating in your abdomen.   You have signs of dehydration, such as:  ? Dark urine, very little urine, or no urine.  ? Cracked lips.  ? Dry mouth.  ? Sunken eyes.  ? Sleepiness.  ? Weakness.  This information is not intended to replace advice given to you by your health care provider. Make sure you discuss any questions you have with your health care provider.  Document Released: 06/11/2005 Document  Revised: 03/21/2016 Document Reviewed: 02/13/2016  [employer]vier Interactive Patient Education  2019 [employer]

## 2019-03-07 NOTE — Progress Notes (Signed)
BP (!) 143/83   Pulse 82   Temp 98.5 F (36.9 C) (Oral)   Wt 192 lb 6.4 oz (87.3 kg)   SpO2 97%   BMI 27.61 kg/m    Subjective:    Patient ID: Troy Starr Sr., male    DOB: [birth year], 68 y.o.   MRN: [id number]  HPI: Troy CARGILE Sr. is a 68 y.o. male  Chief Complaint  Patient presents with  . Anorexia    has not eaten x 4 days, nor took any medications. Denies diarrhea, vomiting x once, no pain.    ANOREXIA History of prostate CA.  Last seen by Dr. Eulas Post 06/14/18.  Sees his cancer provider once a year.  Reports past treatment with radiation, 40 treatments, but no h/o prostatectomy.  Has not been able to take medicine for 2-3 days.  No appetite, had a banana one day and a cereal bowl, that is all he has ate since Friday.  Has been drinking ginger ale and water, "maybe a cup every few hours".  Had bout of emesis on Saturday only.  Denies pain in abdomen.  He can think of nothing that makes appetite improve.  Overall, he feels bad.  Feels like if he eats something he will throw it up.  Has h/o angiodysplasia of intestine with hemorrhage, last work-up by GI (Dr. Allen Norris) in 2017.  Denies any hematemesis or blood in stool.  Reports he has been passing bowels without difficulty.  Symptoms have not become worse, but they are not improving.  Reports some dizziness with position changes at times.  Labs, urine noting 2+ ketone and 3+ glucose.  Discussed at length with patient options: outpatient urgent abdominal CT scan vs visit to ER today for IV fluids and further work-up, imaging.  Patient would prefer to go to ER, as he feels he needs some IV fluids.  His wife will be taking him to [facility] ER. Discussed need for CT of abdomen while at ER.   Status: remains the same and stable Treatments attempted: ginger ale and water Fever: no Nausea: yes Vomiting: yes Weight loss: no Decreased appetite: yes Diarrhea: no Constipation: no Blood in stool: no Heartburn: no Jaundice: no Rash:  no Dysuria/urinary frequency: no Hematuria: no Recurrent NSAID use: no  Relevant past medical, surgical, family and social history reviewed and updated as indicated. Interim medical history since our last visit reviewed. Allergies and medications reviewed and updated.  Review of Systems  Constitutional: Negative for activity change, diaphoresis, fatigue and fever.  Respiratory: Negative for cough, chest tightness, shortness of breath and wheezing.   Cardiovascular: Negative for chest pain, palpitations and leg swelling.  Gastrointestinal: Positive for nausea and vomiting. Negative for abdominal distention, abdominal pain, constipation and diarrhea.  Endocrine: Negative for cold intolerance, heat intolerance, polydipsia, polyphagia and polyuria.  Musculoskeletal: Negative.   Skin: Negative.   Neurological: Positive for dizziness and weakness. Negative for syncope, light-headedness, numbness and headaches.  Psychiatric/Behavioral: Negative.     Per HPI unless specifically indicated above     Objective:    BP (!) 143/83   Pulse 82   Temp 98.5 F (36.9 C) (Oral)   Wt 192 lb 6.4 oz (87.3 kg)   SpO2 97%   BMI 27.61 kg/m   Wt Readings from Last 3 Encounters:  03/07/19 192 lb 6.4 oz (87.3 kg)  10/26/18 202 lb (91.6 kg)  10/25/18 202 lb (91.6 kg)    Physical Exam Vitals signs and nursing note reviewed.  Constitutional:      General: He is awake. He is not in acute distress.    Appearance: He is well-developed and overweight. He is ill-appearing.     Comments: Lying on exam table, ill appearing.  HENT:     Head: Normocephalic and atraumatic.     Right Ear: Hearing normal. No drainage.     Left Ear: Hearing normal. No drainage.     Mouth/Throat:     Pharynx: Uvula midline.  Eyes:     General: Lids are normal.        Right eye: No discharge.        Left eye: No discharge.     Conjunctiva/sclera: Conjunctivae normal.     Pupils: Pupils are equal, round, and reactive to  light.  Neck:     Musculoskeletal: Normal range of motion and neck supple.     Thyroid: No thyromegaly.     Vascular: No carotid bruit or JVD.     Trachea: Trachea normal.  Cardiovascular:     Rate and Rhythm: Normal rate and regular rhythm.     Heart sounds: Normal heart sounds, S1 normal and S2 normal. No murmur. No gallop.   Pulmonary:     Effort: Pulmonary effort is normal.     Breath sounds: Normal breath sounds.  Abdominal:     General: Bowel sounds are normal. There is no distension.     Palpations: Abdomen is soft. There is no hepatomegaly or splenomegaly.     Tenderness: There is no abdominal tenderness. There is no right CVA tenderness or left CVA tenderness.     Hernia: No hernia is present.     Comments: No tenderness noted on light or deep palpation of abdomen.    Musculoskeletal: Normal range of motion.     Right lower leg: No edema.     Left lower leg: No edema.  Skin:    General: Skin is warm and dry.     Capillary Refill: Capillary refill takes less than 2 seconds.     Findings: No rash.  Neurological:     Mental Status: He is alert and oriented to person, place, and time.     Deep Tendon Reflexes: Reflexes are normal and symmetric.  Psychiatric:        Mood and Affect: Mood normal.        Behavior: Behavior normal. Behavior is cooperative.        Thought Content: Thought content normal.        Judgment: Judgment normal.     Results for orders placed or performed in visit on 03/07/19  Microscopic Examination   URINE  Result Value Ref Range   WBC, UA 0-5 0 - 5 /hpf   RBC 0-2 0 - 2 /hpf   Epithelial Cells (non renal) 0-10 0 - 10 /hpf   Casts Present None seen /lpf   Cast Type Granular casts (A) N/A   Bacteria, UA None seen None seen/Few  UA/M w/rflx Culture, Routine   Specimen: Urine   URINE  Result Value Ref Range   Specific Gravity, UA 1.015 1.005 - 1.030   pH, UA 5.5 5.0 - 7.5   Color, UA Yellow Yellow   Appearance Ur Clear Clear   Leukocytes,UA  Negative Negative   Protein,UA 2+ (A) Negative/Trace   Glucose, UA 3+ (A) Negative   Ketones, UA 2+ (A) Negative   RBC, UA Trace (A) Negative   Bilirubin, UA Negative Negative   Urobilinogen, Ur 0.2  0.2 - 1.0 mg/dL   Nitrite, UA Negative Negative   Microscopic Examination See below:   Bayer DCA Hb A1c Waived  Result Value Ref Range   HB A1C (BAYER DCA - WAIVED) 7.4 (H) <7.0 %  Lipid Panel Piccolo, Waived  Result Value Ref Range   Cholesterol Piccolo, Waived 169 <200 mg/dL   HDL Chol Piccolo, Waived 49 (L) >59 mg/dL   Triglycerides Piccolo,Waived 180 (H) <150 mg/dL   Chol/HDL Ratio Piccolo,Waive 3.4 mg/dL   LDL Chol Calc Piccolo Waived 84 <100 mg/dL   VLDL Chol Calc Piccolo,Waive 36 (H) <30 mg/dL      Assessment & Plan:   Problem List Items Addressed This Visit      Other   Dehydration - Primary    Acute with decreased appetite and h/o prostate CA + 10 pound weight loss since February.  Urine noting 2+ ketone, 3+ glucose, 2 + protein.  A1C 7.4% (has not taken medication in 4 days).  CBC and CMP sent.  Discussed at length with patient options urgent abdominal CT vs visit to ER.  He prefers to go to ER for IV hydration and further work-up.  Discussed that he would benefit from CT abdomen while in ER. Report provided to [facility].  Patient's wife to take him to ER today.      Relevant Orders   UA/M w/rflx Culture, Routine (Completed)   Comprehensive metabolic panel   CBC with Differential/Platelet   Bayer DCA Hb A1c Waived (Completed)   Lipid Panel Piccolo, Waived (Completed)      Time: 25 minutes, >50% spent counseling/or care coordination  Follow up plan: Return if symptoms worsen or fail to improve.

## 2019-03-07 NOTE — Assessment & Plan Note (Signed)
Acute with decreased appetite and h/o prostate CA + 10 pound weight loss since February.  Urine noting 2+ ketone, 3+ glucose, 2 + protein.  A1C 7.4% (has not taken medication in 4 days).  CBC and CMP sent.  Discussed at length with patient options urgent abdominal CT vs visit to ER.  He prefers to go to ER for IV hydration and further work-up.  Discussed that he would benefit from CT abdomen while in ER. Report provided to [facility].  Patient's wife to take him to ER today.

## 2019-03-08 ENCOUNTER — Telehealth: Payer: Self-pay | Admitting: Nurse Practitioner

## 2019-03-08 ENCOUNTER — Other Ambulatory Visit: Payer: Self-pay | Admitting: Nurse Practitioner

## 2019-03-08 DIAGNOSIS — D72810 Lymphocytopenia: Secondary | ICD-10-CM

## 2019-03-08 LAB — COMPREHENSIVE METABOLIC PANEL
ALT: 30 IU/L (ref 0–44)
AST: 32 IU/L (ref 0–40)
Albumin/Globulin Ratio: 1.9 (ref 1.2–2.2)
Albumin: 4.5 g/dL (ref 3.8–4.8)
Alkaline Phosphatase: 40 IU/L (ref 39–117)
BUN/Creatinine Ratio: 18 (ref 10–24)
BUN: 24 mg/dL (ref 8–27)
Bilirubin Total: 0.3 mg/dL (ref 0.0–1.2)
CO2: 20 mmol/L (ref 20–29)
Calcium: 9 mg/dL (ref 8.6–10.2)
Chloride: 95 mmol/L — ABNORMAL LOW (ref 96–106)
Creatinine, Ser: 1.36 mg/dL — ABNORMAL HIGH (ref 0.76–1.27)
GFR calc Af Amer: 62 mL/min/{1.73_m2} (ref >59)
GFR calc non Af Amer: 53 mL/min/{1.73_m2} — ABNORMAL LOW (ref >59)
Globulin, Total: 2.4 g/dL (ref 1.5–4.5)
Glucose: 234 mg/dL — ABNORMAL HIGH (ref 65–99)
Potassium: 4.5 mmol/L (ref 3.5–5.2)
Sodium: 131 mmol/L — ABNORMAL LOW (ref 134–144)
Total Protein: 6.9 g/dL (ref 6.0–8.5)

## 2019-03-08 LAB — CBC WITH DIFFERENTIAL/PLATELET
Basophils Absolute: 0 10*3/uL (ref 0.0–0.2)
Basos: 0 %
EOS (ABSOLUTE): 0 10*3/uL (ref 0.0–0.4)
Eos: 0 %
Hematocrit: 42.8 % (ref 37.5–51.0)
Hemoglobin: 14.8 g/dL (ref 13.0–17.7)
Immature Grans (Abs): 0 10*3/uL (ref 0.0–0.1)
Immature Granulocytes: 0 %
Lymphocytes Absolute: 0.5 10*3/uL — ABNORMAL LOW (ref 0.7–3.1)
Lymphs: 15 %
MCH: 32.8 pg (ref 26.6–33.0)
MCHC: 34.6 g/dL (ref 31.5–35.7)
MCV: 95 fL (ref 79–97)
Monocytes Absolute: 0.5 10*3/uL (ref 0.1–0.9)
Monocytes: 16 %
Neutrophils Absolute: 2.2 10*3/uL (ref 1.4–7.0)
Neutrophils: 69 %
Platelets: 143 10*3/uL — ABNORMAL LOW (ref 150–450)
RBC: 4.51 x10E6/uL (ref 4.14–5.80)
RDW: 13.3 % (ref 11.6–15.4)
WBC: 3.2 10*3/uL — ABNORMAL LOW (ref 3.4–10.8)

## 2019-03-08 NOTE — Progress Notes (Signed)
Repeat labs:

## 2019-03-08 NOTE — Telephone Encounter (Signed)
Spoke to him on phone and discussed [facility] and office labs noting low WBC, lymphs, and platelets.  Would like him to come in 4 weeks for lab repeat.  Can you call and schedule this please?  Thank you.

## 2019-03-08 NOTE — Telephone Encounter (Signed)
Pt presented in office stating that [facility] wanted him to have provider look at findings from yesterday. Pt would like to have Jolene call him. Routing to [employer]

## 2019-03-09 NOTE — Telephone Encounter (Signed)
Called pt to get him scheduled for labs, no answer left vm

## 2019-03-11 ENCOUNTER — Telehealth: Payer: Self-pay | Admitting: Nurse Practitioner

## 2019-03-11 ENCOUNTER — Emergency Department: Payer: Medicare Other

## 2019-03-11 ENCOUNTER — Other Ambulatory Visit: Payer: Self-pay

## 2019-03-11 ENCOUNTER — Emergency Department
Admission: EM | Admit: 2019-03-11 | Discharge: 2019-03-12 | Disposition: A | Discharge status: Other Acute Inpatient Hospital | Payer: Medicare Other | Attending: Emergency Medicine | Admitting: Emergency Medicine

## 2019-03-11 DIAGNOSIS — N281 Cyst of kidney, acquired: Secondary | ICD-10-CM | POA: Diagnosis not present

## 2019-03-11 DIAGNOSIS — Z7982 Long term (current) use of aspirin: Secondary | ICD-10-CM | POA: Diagnosis not present

## 2019-03-11 DIAGNOSIS — R410 Disorientation, unspecified: Secondary | ICD-10-CM | POA: Insufficient documentation

## 2019-03-11 DIAGNOSIS — K760 Fatty (change of) liver, not elsewhere classified: Secondary | ICD-10-CM | POA: Diagnosis not present

## 2019-03-11 DIAGNOSIS — Z87891 Personal history of nicotine dependence: Secondary | ICD-10-CM | POA: Diagnosis not present

## 2019-03-11 DIAGNOSIS — U071 COVID-19: Secondary | ICD-10-CM | POA: Diagnosis not present

## 2019-03-11 DIAGNOSIS — I10 Essential (primary) hypertension: Secondary | ICD-10-CM | POA: Insufficient documentation

## 2019-03-11 DIAGNOSIS — E119 Type 2 diabetes mellitus without complications: Secondary | ICD-10-CM | POA: Insufficient documentation

## 2019-03-11 DIAGNOSIS — Z7984 Long term (current) use of oral hypoglycemic drugs: Secondary | ICD-10-CM | POA: Diagnosis not present

## 2019-03-11 DIAGNOSIS — Z79899 Other long term (current) drug therapy: Secondary | ICD-10-CM | POA: Insufficient documentation

## 2019-03-11 DIAGNOSIS — R0989 Other specified symptoms and signs involving the circulatory and respiratory systems: Secondary | ICD-10-CM | POA: Diagnosis not present

## 2019-03-11 DIAGNOSIS — R4182 Altered mental status, unspecified: Secondary | ICD-10-CM | POA: Diagnosis not present

## 2019-03-11 DIAGNOSIS — R05 Cough: Secondary | ICD-10-CM | POA: Diagnosis not present

## 2019-03-11 LAB — CBC
HCT: 42 % (ref 39.0–52.0)
Hemoglobin: 14.5 g/dL (ref 13.0–17.0)
MCH: 32.7 pg (ref 26.0–34.0)
MCHC: 34.5 g/dL (ref 30.0–36.0)
MCV: 94.8 fL (ref 80.0–100.0)
Platelets: 200 10*3/uL (ref 150–400)
RBC: 4.43 MIL/uL (ref 4.22–5.81)
RDW: 13.2 % (ref 11.5–15.5)
WBC: 4 10*3/uL (ref 4.0–10.5)
nRBC: 0 % (ref 0.0–0.2)

## 2019-03-11 LAB — COMPREHENSIVE METABOLIC PANEL
ALT: 44 U/L (ref 0–44)
AST: 49 U/L — ABNORMAL HIGH (ref 15–41)
Albumin: 3.7 g/dL (ref 3.5–5.0)
Alkaline Phosphatase: 33 U/L — ABNORMAL LOW (ref 38–126)
Anion gap: 14 (ref 5–15)
BUN: 17 mg/dL (ref 8–23)
CO2: 20 mmol/L — ABNORMAL LOW (ref 22–32)
Calcium: 9 mg/dL (ref 8.9–10.3)
Chloride: 99 mmol/L (ref 98–111)
Creatinine, Ser: 1.09 mg/dL (ref 0.61–1.24)
GFR calc Af Amer: >60 mL/min (ref >60)
GFR calc non Af Amer: >60 mL/min (ref >60)
Glucose, Bld: 193 mg/dL — ABNORMAL HIGH (ref 70–99)
Potassium: 3.7 mmol/L (ref 3.5–5.1)
Sodium: 133 mmol/L — ABNORMAL LOW (ref 135–145)
Total Bilirubin: 0.6 mg/dL (ref 0.3–1.2)
Total Protein: 7.1 g/dL (ref 6.5–8.1)

## 2019-03-11 LAB — URINALYSIS, COMPLETE (UACMP) WITH MICROSCOPIC
Bacteria, UA: NONE SEEN
Bilirubin Urine: NEGATIVE
Glucose, UA: >=500 mg/dL — AB
Hgb urine dipstick: NEGATIVE
Ketones, ur: NEGATIVE mg/dL
Leukocytes,Ua: NEGATIVE
Nitrite: NEGATIVE
Protein, ur: 30 mg/dL — AB
Specific Gravity, Urine: 1.041 — ABNORMAL HIGH (ref 1.005–1.030)
Squamous Epithelial / HPF: NONE SEEN (ref 0–5)
pH: 6 (ref 5.0–8.0)

## 2019-03-11 LAB — SARS CORONAVIRUS 2 BY RT PCR (HOSPITAL ORDER, PERFORMED IN ~~LOC~~ HOSPITAL LAB): SARS Coronavirus 2: POSITIVE — AB

## 2019-03-11 LAB — TROPONIN I (HIGH SENSITIVITY): Troponin I (High Sensitivity): 12 ng/L (ref <18)

## 2019-03-11 LAB — GLUCOSE, CAPILLARY
Glucose-Capillary: 181 mg/dL — ABNORMAL HIGH (ref 70–99)
Glucose-Capillary: 129 mg/dL — ABNORMAL HIGH (ref 70–99)

## 2019-03-11 MED ORDER — IOHEXOL 240 MG/ML SOLN
50.0000 mL | Freq: Once | INTRAMUSCULAR | Status: AC | PRN
  Administered 2019-03-11: 50 mL via ORAL

## 2019-03-11 MED ORDER — SODIUM CHLORIDE 0.9 % IV BOLUS
1000.0000 mL | Freq: Once | INTRAVENOUS | Status: AC
  Administered 2019-03-11: 1000 mL via INTRAVENOUS

## 2019-03-11 MED ORDER — ACETAMINOPHEN 325 MG PO TABS
650.0000 mg | ORAL_TABLET | Freq: Once | ORAL | Status: AC
  Administered 2019-03-11: 650 mg via ORAL
  Filled 2019-03-11: qty 2

## 2019-03-11 MED ORDER — IOHEXOL 300 MG/ML  SOLN
100.0000 mL | Freq: Once | INTRAMUSCULAR | Status: AC | PRN
  Administered 2019-03-11: 100 mL via INTRAVENOUS

## 2019-03-11 NOTE — ED Provider Notes (Signed)
Patient's coronavirus test is positive.  I have discussed his case with the [place] resident and attending physician and someone else to make 3 different calls.  We are waiting now for the ER doctor evaluate the patient see if he can do an ER to ER transfer for for him to be on the floor.  I am waiting for them to call back.   Nena Polio, MD 03/11/19 2202

## 2019-03-11 NOTE — ED Provider Notes (Signed)
CT of the head and neck show no acute disease.  Patient with intermittent altered level consciousness.  Could also possibly be a complex seizures.  Patient should probably get an EEG as well.   Nena Polio, MD 03/11/19 [phone]

## 2019-03-11 NOTE — ED Notes (Signed)
Pt ambulated to commode with assistance. Able to urinate and pass a small quantity of formed brown stool. Pt performed self hygiene and returned to bed and is resting comfortably,

## 2019-03-11 NOTE — Telephone Encounter (Signed)
Troy Christensen pt's wife came in stating that pt has not took his medicine all week and is still not better. States that he is not eating still and urinating on himself. She states that pt is refusing to be seen again. Please advise.

## 2019-03-11 NOTE — Telephone Encounter (Addendum)
Attempted to call patient via telephone with no answer.  Called his wife, Clarise Cruz, who is a Set designer.  She stated he has continued to not take medication all week and still not better.  States she only got him to take a couple Metformin this morning.  Reports he is often just staring at pills.  He continues to barely eat and has started urinating on himself.  He was seen in [facility] ER on Monday, and patient did tell them that PCP recommend a CT scan abdomen due to his symptoms and past history of prostate CA.  Was noted to have some leukopenia in ER, but at time he was given IV fluids and wished to go home.  Was to return to office in 4 weeks for lab repeat and visit or for worsening symptoms sooner.  His wife reports he does not want to come into office today.  I have recommended that his wife call EMS ASAP and that patient have further evaluation at [facility] ER.  Needs imaging and further labs due to worsening symptoms.

## 2019-03-11 NOTE — ED Provider Notes (Signed)
[facility] Emergency Department Provider Note  Time seen: 1:19 PM  I have reviewed the triage vital signs and the nursing notes.   HISTORY  Chief Complaint Cough and Altered Mental Status   HPI Troy MOFFA Sr. is a 68 y.o. male with a past medical history of diabetes, hypertension, hyperlipidemia, presents to the emergency department "not feeling well."  According to the patient for the past 1 week he has just not been feeling well, is having a hard time describing exactly what that means.  Per record review it appears the patient has been seeing his PCP for the same, states he has been urinating on himself from time to time, has been confused and forgetful.  Upon coming to the emergency department, triage nurse states the patient had several episodes of sluggishness where he was very slow to respond and possible brief syncopal episodes where he would become hypoxic and his pulse rate would drop.  Patient denies any chest pain, denies any shortness of breath.  Denies any cough congestion or fever although the patient did cough during my evaluation.  Patient is alert and oriented x3.  Past Medical History:  Diagnosis Date  . Cancer [facility])    Prostate  . Diabetes mellitus without complication ([place])   . Glaucoma   . Gout   . Hypercholesteremia   . Hypertension   . Osteoarthritis    hands  . Prostate cancer [facility])     Patient Active Problem List   Diagnosis Date Noted  . Dehydration 03/07/2019  . Skin infection 01/11/2018  . Advanced care planning/counseling discussion 10/15/2017  . Prostate cancer ([place]) 07/14/2017  . Angiodysplasia of intestine with hemorrhage   . 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 Glaucoma 11/20/2015  . Gout 11/20/2015  . History of prostate cancer 10/13/2015  . Osteoarthritis 03/21/2015  . 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 Obesity, diabetes, and hypertension syndrome ([place]) 03/21/2015  . Hypertension 03/21/2015  .  Hyperlipidemia 03/21/2015    Past Surgical History:  Procedure Laterality Date  . FLEXIBLE SIGMOIDOSCOPY N/A 01/10/2016   Procedure: FLEXIBLE SIGMOIDOSCOPY with  argon plasma coagulation;  Surgeon: Lucilla Lame, MD;  Location: [place];  Service: Endoscopy;  Laterality: N/A;  Diabetic - oral meds  . HERNIA REPAIR    . JOINT REPLACEMENT  2014   Right Knee Replacement  . MENISCUS REPAIR Left   . nephrolithiasis     pt denies  . PROSTATE SURGERY     Radiation treatments - no surgery  . ROTATOR CUFF REPAIR Right 2012    Prior to Admission medications   Medication Sig Start Date End Date Taking? Authorizing Provider  allopurinol (ZYLOPRIM) 100 MG tablet Take 1 tablet (100 mg total) by mouth daily. 10/26/18   Guadalupe Maple, MD  amLODipine-benazepril (LOTREL) 5-20 MG capsule TAKE 2 CAPSULES ONCE EACH DAY 10/26/18   Guadalupe Maple, MD  aspirin 81 MG tablet Take 81 mg by mouth daily. Reported on 11/20/2015    [provider]  dapagliflozin propanediol (FARXIGA) 10 MG TABS tablet Take 10 mg by mouth daily. 10/26/18   Guadalupe Maple, MD  DOCOSAHEXAENOIC ACID PO Take by mouth.    [provider]  glucose blood test strip 1 each by Other route as needed for other. Use contour next test strip to check sugar 2 times daily 05/20/17   Kathrine Haddock, NP  LOTEMAX SM 0.38 % GEL  09/29/18   [provider]  lovastatin (MEVACOR)  40 MG tablet Take 1 tablet (40 mg total) by mouth daily. 10/26/18   Guadalupe Maple, MD  meloxicam (MOBIC) 15 MG tablet Take 1 tablet (15 mg total) by mouth daily. 10/26/18   Guadalupe Maple, MD  metFORMIN (GLUCOPHAGE-XR) 500 MG 24 hr tablet Take 2 tablets (1,000 mg total) by mouth 2 (two) times daily. 10/26/18   Guadalupe Maple, MD  pioglitazone (ACTOS) 45 MG tablet Take 1 tablet (45 mg total) by mouth daily. 10/26/18   Guadalupe Maple, MD  PROLENSA 0.07 % SOLN INSTILL 1 DROP INTO LEFT EYE ONCE DAILY; START 1 DAY BEFORE SURGERY 10/02/18    [provider]  sildenafil (REVATIO) 20 MG tablet Take 1-5 tablets (20-100 mg total) by mouth as needed. 10/15/17   Guadalupe Maple, MD  sitaGLIPtin (JANUVIA) 100 MG tablet Take 1 tablet (100 mg total) by mouth daily. 10/26/18   Guadalupe Maple, MD  timolol (TIMOPTIC) 0.5 % ophthalmic solution 1 drop 2 (two) times daily.    [provider]    No Known Allergies  Family History  Problem Relation Age of Onset  . Cancer Father 66       lung  . Emphysema Mother   . Dementia Mother   . Cancer Brother        melanoma    Social History Social History   Tobacco Use  . Smoking status: Former Smoker    Types: Cigarettes    Quit date: 03/26/1975    Years since quitting: 43.9  . Smokeless tobacco: Never Used  Substance Use Topics  . Alcohol use: No    Alcohol/week: 0.0 standard drinks  . Drug use: No    Review of Systems Constitutional: Negative for fever. Cardiovascular: Negative for chest pain. Respiratory: Negative for shortness of breath. Gastrointestinal: Negative for abdominal pain Musculoskeletal: Negative for musculoskeletal complaints Skin: Negative for skin complaints  Neurological: Negative for headache All other ROS negative  ____________________________________________   PHYSICAL EXAM:  VITAL SIGNS: ED Triage Vitals  Enc Vitals Group     BP 03/11/19 1250 (!) 99/55     Pulse Rate 03/11/19 1250 (!) 104     Resp 03/11/19 1250 18     Temp 03/11/19 1250 98.4 F (36.9 C)     Temp Source 03/11/19 1250 Oral     SpO2 03/11/19 1250 95 %     Weight 03/11/19 1247 190 lb (86.2 kg)     Height 03/11/19 1247 5\' 11"  (1.803 m)     Head Circumference --      Peak Flow --      Pain Score 03/11/19 1247 0     Pain Loc --      Pain Edu? --      Excl. in [place]? --    Constitutional: Alert and oriented. Well appearing and in no distress. Eyes: Normal exam ENT      Head: Normocephalic and atraumatic.      Mouth/Throat: Mucous membranes are  moist. Cardiovascular: Normal rate, regular rhythm.  Respiratory: Normal respiratory effort without tachypnea nor retractions. Breath sounds are clear Gastrointestinal: Soft and nontender. No distention.  Musculoskeletal: Nontender with normal range of motion in all extremities. Neurologic:  Normal speech and language. No gross focal neurologic deficits  Skin:  Skin is warm, dry and intact.  Psychiatric: Mood and affect are normal.   ____________________________________________    EKG  EKG viewed and interpreted by myself shows a normal sinus rhythm 80 bpm with a  narrow QRS, normal axis, normal intervals, nonspecific ST changes.    INITIAL IMPRESSION / ASSESSMENT AND PLAN / ED COURSE  Pertinent labs & imaging results that were available during my care of the patient were reviewed by me and considered in my medical decision making (see chart for details).   Patient presents to the emergency department for 1 week of not feeling well.  Triage reports patient sent for cough congestion decreased appetite, patient admits to decreased appetite but to myself denies any cough congestion fever or shortness of breath.  Has urinated on himself over the past 1 week which is atypical.  Currently the patient appears well he is answering all my questions appropriately, no complaints at this time besides not feeling well but he is having a hard time quantifying or qualifying exactly what that means.  We will check labs, EKG, IV fluids, chest x-ray, corona swab and continue to closely monitor.  Differential this time would be quite broad but would include infectious etiologies such as urinary tract infection, pneumonia, coronavirus, metabolic or electrolyte abnormality.  Troy Starr Sr. was evaluated in Emergency Department on 03/11/2019 for the symptoms described in the history of present illness. He was evaluated in the context of the global COVID-19 pandemic, which necessitated consideration that the  patient might be at risk for infection with the SARS-CoV-2 virus that causes COVID-19. Institutional protocols and algorithms that pertain to the evaluation of patients at risk for COVID-19 are in a state of rapid change based on information released by regulatory bodies including the CDC and federal and state organizations. These policies and algorithms were followed during the patient's care in the ED.  ____________________________________________   FINAL CLINICAL IMPRESSION(S) / ED DIAGNOSES  Confusion    Harvest Dark, MD 03/21/19 1447

## 2019-03-11 NOTE — Telephone Encounter (Signed)
Spoke to Troy Christensen via telephone and they are on way to ER at this time.  He does endorse urinating on himself two times yesterday and his wife reports she was just able to get him to take his medications.  Called triage nurse at [facility] ER to alert.

## 2019-03-11 NOTE — ED Triage Notes (Addendum)
Pt reports he has been sick x1 week - PCP sent here for cough, congestion, decreased appetite  Pt is very slow to respond and answer questions if he answers at all - pt states he is normally quick but cannot collect thoughts x1 week

## 2019-03-11 NOTE — ED Notes (Signed)
Pt placed on oxygen at 2lpm via [place] for pox on ra of 92%. Pt denies needs. Call bell at left side. Po fluids at bedside.

## 2019-03-12 ENCOUNTER — Ambulatory Visit (HOSPITAL_COMMUNITY)
Admission: AD | Admit: 2019-03-12 | Discharge: 2019-03-12 | Disposition: A | Discharge status: Home / Self Care | Payer: Medicare Other | Source: Other Acute Inpatient Hospital | Attending: Emergency Medicine | Admitting: Emergency Medicine

## 2019-03-12 ENCOUNTER — Other Ambulatory Visit: Payer: Self-pay

## 2019-03-12 DIAGNOSIS — U071 COVID-19: Secondary | ICD-10-CM | POA: Diagnosis not present

## 2019-03-12 LAB — GLUCOSE, CAPILLARY: Glucose-Capillary: 124 mg/dL — ABNORMAL HIGH (ref 70–99)

## 2019-03-12 MED ORDER — METFORMIN HCL ER 500 MG PO TB24
1000.0000 mg | ORAL_TABLET | Freq: Two times a day (BID) | ORAL | Status: DC
  Administered 2019-03-12: 1000 mg via ORAL
  Filled 2019-03-12 (×2): qty 2

## 2019-03-12 MED ORDER — PIOGLITAZONE HCL 30 MG PO TABS
45.0000 mg | ORAL_TABLET | Freq: Every day | ORAL | Status: DC
  Administered 2019-03-12: 45 mg via ORAL
  Filled 2019-03-12: qty 1

## 2019-03-12 MED ORDER — AMLODIPINE BESYLATE 5 MG PO TABS
10.0000 mg | ORAL_TABLET | Freq: Every day | ORAL | Status: DC
  Administered 2019-03-12: 10 mg via ORAL
  Filled 2019-03-12: qty 2

## 2019-03-12 MED ORDER — BENAZEPRIL HCL 20 MG PO TABS
40.0000 mg | ORAL_TABLET | Freq: Every day | ORAL | Status: DC
  Administered 2019-03-12: 09:00:00 40 mg via ORAL
  Filled 2019-03-12: qty 2

## 2019-03-12 MED ORDER — AMLODIPINE BESY-BENAZEPRIL HCL 5-20 MG PO CAPS: 2.0000 | ORAL_CAPSULE | Freq: Every day | ORAL | Status: DC

## 2019-03-12 MED ORDER — PRAVASTATIN SODIUM 40 MG PO TABS
40.0000 mg | ORAL_TABLET | Freq: Every day | ORAL | Status: DC
  Filled 2019-03-12: qty 1

## 2019-03-12 MED ORDER — ACETAMINOPHEN 500 MG PO TABS
1000.0000 mg | ORAL_TABLET | Freq: Once | ORAL | Status: AC
  Administered 2019-03-12: 1000 mg via ORAL
  Filled 2019-03-12: qty 2

## 2019-03-12 MED ORDER — MELOXICAM 7.5 MG PO TABS
15.0000 mg | ORAL_TABLET | Freq: Every day | ORAL | Status: DC
  Administered 2019-03-12: 15 mg via ORAL
  Filled 2019-03-12: qty 2

## 2019-03-12 MED ORDER — LINAGLIPTIN 5 MG PO TABS
5.0000 mg | ORAL_TABLET | Freq: Every day | ORAL | Status: DC
  Administered 2019-03-12: 5 mg via ORAL
  Filled 2019-03-12: qty 1

## 2019-03-12 MED ORDER — ALLOPURINOL 100 MG PO TABS
100.0000 mg | ORAL_TABLET | Freq: Every day | ORAL | Status: DC
  Administered 2019-03-12: 100 mg via ORAL
  Filled 2019-03-12: qty 1

## 2019-03-12 NOTE — ED Notes (Signed)
House supervisor notified of need for hospital bed.

## 2019-03-12 NOTE — ED Notes (Signed)
Pt alert, no c/o pain. Pt tolerating po liquids. Given juice per pt request. Tolerating well.

## 2019-03-12 NOTE — ED Notes (Signed)
Report to kim, rn.  

## 2019-03-12 NOTE — ED Notes (Signed)
Pt updated that spouse has been notified. Pt denies needs. Pt does not appears in any acute distress.

## 2019-03-12 NOTE — ED Notes (Addendum)
Po fluids provided. Pt offered snack, declines.

## 2019-03-12 NOTE — ED Notes (Signed)
Pt's spouse updated via telephone regarding progression of care plan and transfer.

## 2019-03-12 NOTE — ED Notes (Signed)
emtala reviewed by this RN 

## 2019-03-12 NOTE — ED Notes (Signed)
Pt awake, denies needs. Po fluids at bedside, call bell at left side. Pt has urinal if needed.

## 2019-03-12 NOTE — ED Notes (Signed)
Report call to [facility] at [facility] Will call when transferred [phone]

## 2019-03-12 NOTE — ED Notes (Signed)
Pt assisted up to commode to void. Pt is currently alert to self, situation and place, pt is alert to day of week and year, but thought it was July not June. Pt denies other needs.

## 2019-03-12 NOTE — ED Notes (Signed)
Pt taken to toilet with one assist. Pt had loose stool x1

## 2019-03-14 ENCOUNTER — Telehealth: Payer: Self-pay | Admitting: Family Medicine

## 2019-03-14 ENCOUNTER — Telehealth: Payer: Self-pay | Admitting: Nurse Practitioner

## 2019-03-14 NOTE — Telephone Encounter (Signed)
Patient's wife Judson Roch calling to notify PCP that patient tested positive for COVID 19 and is at the [facility] where he is doing well so far and is estimated to be out released in about 3 days.

## 2019-03-14 NOTE — Telephone Encounter (Signed)
Copied from [place] [id number]. Topic: Quick Communication - See Telephone Encounter >> Mar 14, 2019  4:19 PM [place] wrote: CRM for notification. See Telephone encounter for: 03/14/19. Judson Roch pt wife says that he is home hospital and doing much better.

## 2019-03-14 NOTE — Telephone Encounter (Signed)
Noted and reviewed past notes.

## 2019-03-14 NOTE — Telephone Encounter (Signed)
Noted, have been following in chart over weekend.

## 2019-04-11 ENCOUNTER — Other Ambulatory Visit: Payer: Medicare Other

## 2019-04-26 ENCOUNTER — Ambulatory Visit: Payer: Medicare Other | Admitting: Family Medicine

## 2019-05-04 ENCOUNTER — Ambulatory Visit (INDEPENDENT_AMBULATORY_CARE_PROVIDER_SITE_OTHER): Payer: Medicare Other | Admitting: Family Medicine

## 2019-05-04 ENCOUNTER — Other Ambulatory Visit: Payer: Self-pay

## 2019-05-04 ENCOUNTER — Encounter: Payer: Self-pay | Admitting: Family Medicine

## 2019-05-04 DIAGNOSIS — E119 Type 2 diabetes mellitus without complications: Secondary | ICD-10-CM

## 2019-05-04 DIAGNOSIS — E1169 Type 2 diabetes mellitus with other specified complication: Secondary | ICD-10-CM | POA: Diagnosis not present

## 2019-05-04 DIAGNOSIS — I10 Essential (primary) hypertension: Secondary | ICD-10-CM

## 2019-05-04 DIAGNOSIS — E785 Hyperlipidemia, unspecified: Secondary | ICD-10-CM | POA: Diagnosis not present

## 2019-05-04 DIAGNOSIS — E1159 Type 2 diabetes mellitus with other circulatory complications: Secondary | ICD-10-CM | POA: Diagnosis not present

## 2019-05-04 DIAGNOSIS — E669 Obesity, unspecified: Secondary | ICD-10-CM

## 2019-05-04 DIAGNOSIS — I152 Hypertension secondary to endocrine disorders: Secondary | ICD-10-CM

## 2019-05-04 NOTE — Assessment & Plan Note (Signed)
The current medical regimen is effective;  continue present plan and medications.  

## 2019-05-04 NOTE — Progress Notes (Signed)
There were no vitals taken for this visit.   Subjective:    Patient ID: Troy Starr Sr., male    DOB: 1951/01/06, 69 y.o.   MRN: [id number]  HPI: Troy Champney. is a 68 y.o. male  Med check Discussed with patient all in all doing well reviewed lab work from June with elevated A1c but otherwise stable. Reviewed [id number] recovery patient feels he is completely recovered with no residual side effects. No low blood sugar spells or other issues blood pressure good control no side effects from medication same with cholesterol.  Relevant past medical, surgical, family and social history reviewed and updated as indicated. Interim medical history since our last visit reviewed. Allergies and medications reviewed and updated.  Review of Systems  Constitutional: Negative.   Respiratory: Negative.   Cardiovascular: Negative.     Per HPI unless specifically indicated above     Objective:    There were no vitals taken for this visit.  Wt Readings from Last 3 Encounters:  03/11/19 190 lb (86.2 kg)  03/07/19 192 lb 6.4 oz (87.3 kg)  10/26/18 202 lb (91.6 kg)    Physical Exam  Results for orders placed or performed during the hospital encounter of 03/11/19  SARS Coronavirus 2 (CEPHEID - Performed in [place] hospital lab), [facility] Order   Specimen: Nasopharyngeal Swab  Result Value Ref Range   SARS Coronavirus 2 POSITIVE (A) NEGATIVE  Comprehensive metabolic panel  Result Value Ref Range   Sodium 133 (L) 135 - 145 mmol/L   Potassium 3.7 3.5 - 5.1 mmol/L   Chloride 99 98 - 111 mmol/L   CO2 20 (L) 22 - 32 mmol/L   Glucose, Bld 193 (H) 70 - 99 mg/dL   BUN 17 8 - 23 mg/dL   Creatinine, Ser 1.09 0.61 - 1.24 mg/dL   Calcium 9.0 8.9 - 10.3 mg/dL   Total Protein 7.1 6.5 - 8.1 g/dL   Albumin 3.7 3.5 - 5.0 g/dL   AST 49 (H) 15 - 41 U/L   ALT 44 0 - 44 U/L   Alkaline Phosphatase 33 (L) 38 - 126 U/L   Total Bilirubin 0.6 0.3 - 1.2 mg/dL   GFR calc non Af Amer >60 >60 mL/min    GFR calc Af Amer >60 >60 mL/min   Anion gap 14 5 - 15  CBC  Result Value Ref Range   WBC 4.0 4.0 - 10.5 K/uL   RBC 4.43 4.22 - 5.81 MIL/uL   Hemoglobin 14.5 13.0 - 17.0 g/dL   HCT 42.0 39.0 - 52.0 %   MCV 94.8 80.0 - 100.0 fL   MCH 32.7 26.0 - 34.0 pg   MCHC 34.5 30.0 - 36.0 g/dL   RDW 13.2 11.5 - 15.5 %   Platelets 200 150 - 400 K/uL   nRBC 0.0 0.0 - 0.2 %  Glucose, capillary  Result Value Ref Range   Glucose-Capillary 181 (H) 70 - 99 mg/dL  Troponin I (High Sensitivity)  Result Value Ref Range   Troponin I (High Sensitivity) 12 <18 ng/L  Urinalysis, Complete w Microscopic  Result Value Ref Range   Color, Urine YELLOW (A) YELLOW   APPearance CLEAR (A) CLEAR   Specific Gravity, Urine 1.041 (H) 1.005 - 1.030   pH 6.0 5.0 - 8.0   Glucose, UA >=500 (A) NEGATIVE mg/dL   Hgb urine dipstick NEGATIVE NEGATIVE   Bilirubin Urine NEGATIVE NEGATIVE   Ketones, ur NEGATIVE NEGATIVE mg/dL   Protein, ur  30 (A) NEGATIVE mg/dL   Nitrite NEGATIVE NEGATIVE   Leukocytes,Ua NEGATIVE NEGATIVE   RBC / HPF 0-5 0 - 5 RBC/hpf   WBC, UA 0-5 0 - 5 WBC/hpf   Bacteria, UA NONE SEEN NONE SEEN   Squamous Epithelial / LPF NONE SEEN 0 - 5  Glucose, capillary  Result Value Ref Range   Glucose-Capillary 129 (H) 70 - 99 mg/dL  Glucose, capillary  Result Value Ref Range   Glucose-Capillary 124 (H) 70 - 99 mg/dL      Assessment & Plan:   Problem List Items Addressed This Visit      Cardiovascular and Mediastinum   Obesity, diabetes, and hypertension syndrome (HCC)    The current medical regimen is effective;  continue present plan and medications.       Hypertension    The current medical regimen is effective;  continue present plan and medications.         Other   Hyperlipidemia    The current medical regimen is effective;  continue present plan and medications.           Follow up plan: Return in about 6 months (around 11/04/2019) for Physical Exam, Hemoglobin A1c.

## 2019-05-31 ENCOUNTER — Ambulatory Visit: Payer: Medicare Other | Admitting: Family Medicine

## 2019-06-01 ENCOUNTER — Other Ambulatory Visit: Payer: Self-pay

## 2019-06-01 ENCOUNTER — Encounter: Payer: Self-pay | Admitting: Family Medicine

## 2019-06-01 ENCOUNTER — Ambulatory Visit (INDEPENDENT_AMBULATORY_CARE_PROVIDER_SITE_OTHER): Payer: Medicare Other | Admitting: Family Medicine

## 2019-06-01 DIAGNOSIS — E1169 Type 2 diabetes mellitus with other specified complication: Secondary | ICD-10-CM

## 2019-06-01 DIAGNOSIS — E669 Obesity, unspecified: Secondary | ICD-10-CM | POA: Diagnosis not present

## 2019-06-01 DIAGNOSIS — E1159 Type 2 diabetes mellitus with other circulatory complications: Secondary | ICD-10-CM | POA: Diagnosis not present

## 2019-06-01 DIAGNOSIS — I10 Essential (primary) hypertension: Secondary | ICD-10-CM | POA: Diagnosis not present

## 2019-06-01 DIAGNOSIS — E785 Hyperlipidemia, unspecified: Secondary | ICD-10-CM

## 2019-06-01 DIAGNOSIS — T560X1D Toxic effect of lead and its compounds, accidental (unintentional), subsequent encounter: Secondary | ICD-10-CM | POA: Diagnosis not present

## 2019-06-01 DIAGNOSIS — M1A1790 Lead-induced chronic gout, unspecified ankle and foot, without tophus (tophi): Secondary | ICD-10-CM

## 2019-06-01 MED ORDER — COLCHICINE 0.6 MG PO TABS: 0.6000 mg | ORAL_TABLET | Freq: Every day | ORAL | 1 refills | Status: DC

## 2019-06-01 NOTE — Assessment & Plan Note (Signed)
The current medical regimen is effective;  continue present plan and medications.  

## 2019-06-01 NOTE — Progress Notes (Addendum)
There were no vitals taken for this visit.   Subjective:    Patient ID: Troy Starr Sr., male    DOB: [birth year], 68 y.o.   MRN: [id number]  HPI: Troy FENELON Sr. is a 68 y.o. male  Gout sx lt big toe 2 days ago.  Patient took some colchicine he had leftover which seem to help.  Has been taking allopurinol 100 mg without problems. Had some different foods which he thinks caused the flare. Diabetes blood pressure cholesterol all seems to be doing well with no complaints.   Relevant past medical, surgical, family and social history reviewed and updated as indicated. Interim medical history since our last visit reviewed. Allergies and medications reviewed and updated.  Review of Systems  Constitutional: Negative.   Respiratory: Negative.   Cardiovascular: Negative.     Per HPI unless specifically indicated above     Objective:    There were no vitals taken for this visit.  Wt Readings from Last 3 Encounters:  03/11/19 190 lb (86.2 kg)  03/07/19 192 lb 6.4 oz (87.3 kg)  10/26/18 202 lb (91.6 kg)    Physical Exam  Results for orders placed or performed during the hospital encounter of 03/11/19  SARS Coronavirus 2 (CEPHEID - Performed in [place] hospital lab), [facility] Order   Specimen: Nasopharyngeal Swab  Result Value Ref Range   SARS Coronavirus 2 POSITIVE (A) NEGATIVE  Comprehensive metabolic panel  Result Value Ref Range   Sodium 133 (L) 135 - 145 mmol/L   Potassium 3.7 3.5 - 5.1 mmol/L   Chloride 99 98 - 111 mmol/L   CO2 20 (L) 22 - 32 mmol/L   Glucose, Bld 193 (H) 70 - 99 mg/dL   BUN 17 8 - 23 mg/dL   Creatinine, Ser 1.09 0.61 - 1.24 mg/dL   Calcium 9.0 8.9 - 10.3 mg/dL   Total Protein 7.1 6.5 - 8.1 g/dL   Albumin 3.7 3.5 - 5.0 g/dL   AST 49 (H) 15 - 41 U/L   ALT 44 0 - 44 U/L   Alkaline Phosphatase 33 (L) 38 - 126 U/L   Total Bilirubin 0.6 0.3 - 1.2 mg/dL   GFR calc non Af Amer >60 >60 mL/min   GFR calc Af Amer >60 >60 mL/min   Anion gap 14 5  - 15  CBC  Result Value Ref Range   WBC 4.0 4.0 - 10.5 K/uL   RBC 4.43 4.22 - 5.81 MIL/uL   Hemoglobin 14.5 13.0 - 17.0 g/dL   HCT 42.0 39.0 - 52.0 %   MCV 94.8 80.0 - 100.0 fL   MCH 32.7 26.0 - 34.0 pg   MCHC 34.5 30.0 - 36.0 g/dL   RDW 13.2 11.5 - 15.5 %   Platelets 200 150 - 400 K/uL   nRBC 0.0 0.0 - 0.2 %  Glucose, capillary  Result Value Ref Range   Glucose-Capillary 181 (H) 70 - 99 mg/dL  Troponin I (High Sensitivity)  Result Value Ref Range   Troponin I (High Sensitivity) 12 <18 ng/L  Urinalysis, Complete w Microscopic  Result Value Ref Range   Color, Urine YELLOW (A) YELLOW   APPearance CLEAR (A) CLEAR   Specific Gravity, Urine 1.041 (H) 1.005 - 1.030   pH 6.0 5.0 - 8.0   Glucose, UA >=500 (A) NEGATIVE mg/dL   Hgb urine dipstick NEGATIVE NEGATIVE   Bilirubin Urine NEGATIVE NEGATIVE   Ketones, ur NEGATIVE NEGATIVE mg/dL   Protein, ur 30 (A)  NEGATIVE mg/dL   Nitrite NEGATIVE NEGATIVE   Leukocytes,Ua NEGATIVE NEGATIVE   RBC / HPF 0-5 0 - 5 RBC/hpf   WBC, UA 0-5 0 - 5 WBC/hpf   Bacteria, UA NONE SEEN NONE SEEN   Squamous Epithelial / LPF NONE SEEN 0 - 5  Glucose, capillary  Result Value Ref Range   Glucose-Capillary 129 (H) 70 - 99 mg/dL  Glucose, capillary  Result Value Ref Range   Glucose-Capillary 124 (H) 70 - 99 mg/dL      Assessment & Plan:   Problem List Items Addressed This Visit      Cardiovascular and Mediastinum   Obesity, diabetes, and hypertension syndrome (HCC)    The current medical regimen is effective;  continue present plan and medications.       Hypertension    The current medical regimen is effective;  continue present plan and medications.         Other   Hyperlipidemia    The current medical regimen is effective;  continue present plan and medications.       Gout    Discussed gout and gout symptoms of flare will give refill on colchicine to use as needed and continue allopurinol will check uric acid           Telemedicine using audio/video telecommunications for a synchronous communication visit. Today's visit due to COVID-19 isolation precautions I connected with and verified that I am speaking with the correct person using two identifiers.   I discussed the limitations, risks, security and privacy concerns of performing an evaluation and management service by telecommunication and the availability of in person appointments. I also discussed with the patient that there may be a patient responsible charge related to this service. The patient expressed understanding and agreed to proceed. The patient's location is home. I am at home.   I discussed the assessment and treatment plan with the patient. The patient was provided an opportunity to ask questions and all were answered. The patient agreed with the plan and demonstrated an understanding of the instructions.   The patient was advised to call back or seek an in-person evaluation if the symptoms worsen or if the condition fails to improve as anticipated.   I provided 21+ minutes of time during this encounter.Follow up plan: Return for Physical Exam, Hemoglobin A1c, in February.

## 2019-06-01 NOTE — Assessment & Plan Note (Signed)
Discussed gout and gout symptoms of flare will give refill on colchicine to use as needed and continue allopurinol will check uric acid

## 2019-06-09 ENCOUNTER — Ambulatory Visit (INDEPENDENT_AMBULATORY_CARE_PROVIDER_SITE_OTHER): Payer: Medicare Other

## 2019-06-09 ENCOUNTER — Other Ambulatory Visit: Payer: Medicare Other

## 2019-06-09 ENCOUNTER — Other Ambulatory Visit: Payer: Self-pay

## 2019-06-09 DIAGNOSIS — I10 Essential (primary) hypertension: Secondary | ICD-10-CM

## 2019-06-09 DIAGNOSIS — Z23 Encounter for immunization: Secondary | ICD-10-CM | POA: Diagnosis not present

## 2019-06-09 DIAGNOSIS — E1159 Type 2 diabetes mellitus with other circulatory complications: Secondary | ICD-10-CM | POA: Diagnosis not present

## 2019-06-09 DIAGNOSIS — E1169 Type 2 diabetes mellitus with other specified complication: Secondary | ICD-10-CM | POA: Diagnosis not present

## 2019-06-09 DIAGNOSIS — E785 Hyperlipidemia, unspecified: Secondary | ICD-10-CM

## 2019-06-09 DIAGNOSIS — E669 Obesity, unspecified: Secondary | ICD-10-CM | POA: Diagnosis not present

## 2019-06-09 DIAGNOSIS — M1A1790 Lead-induced chronic gout, unspecified ankle and foot, without tophus (tophi): Secondary | ICD-10-CM

## 2019-06-09 DIAGNOSIS — T560X1D Toxic effect of lead and its compounds, accidental (unintentional), subsequent encounter: Secondary | ICD-10-CM | POA: Diagnosis not present

## 2019-06-10 LAB — LP+ALT+AST PICCOLO, WAIVED
ALT (SGPT) Piccolo, Waived: 20 U/L (ref 10–47)
AST (SGOT) Piccolo, Waived: 20 U/L (ref 11–38)
Chol/HDL Ratio Piccolo,Waive: 3.2 mg/dL
Cholesterol Piccolo, Waived: 172 mg/dL (ref <200)
HDL Chol Piccolo, Waived: 54 mg/dL — ABNORMAL LOW (ref >59)
LDL Chol Calc Piccolo Waived: 49 mg/dL (ref <100)
Triglycerides Piccolo,Waived: 342 mg/dL — ABNORMAL HIGH (ref <150)
VLDL Chol Calc Piccolo,Waive: 68 mg/dL — ABNORMAL HIGH (ref <30)

## 2019-06-10 LAB — BASIC METABOLIC PANEL
BUN/Creatinine Ratio: 18 (ref 10–24)
BUN: 20 mg/dL (ref 8–27)
CO2: 22 mmol/L (ref 20–29)
Calcium: 9.7 mg/dL (ref 8.6–10.2)
Chloride: 100 mmol/L (ref 96–106)
Creatinine, Ser: 1.13 mg/dL (ref 0.76–1.27)
GFR calc Af Amer: 77 mL/min/{1.73_m2} (ref >59)
GFR calc non Af Amer: 67 mL/min/{1.73_m2} (ref >59)
Glucose: 121 mg/dL — ABNORMAL HIGH (ref 65–99)
Potassium: 4.5 mmol/L (ref 3.5–5.2)
Sodium: 137 mmol/L (ref 134–144)

## 2019-06-10 LAB — URIC ACID: Uric Acid: 5.8 mg/dL (ref 3.7–8.6)

## 2019-06-10 LAB — BAYER DCA HB A1C WAIVED: HB A1C (BAYER DCA - WAIVED): 6.6 % (ref <7.0)

## 2019-06-13 ENCOUNTER — Encounter: Payer: Self-pay | Admitting: Family Medicine

## 2019-06-16 ENCOUNTER — Other Ambulatory Visit: Payer: Self-pay

## 2019-06-16 ENCOUNTER — Inpatient Hospital Stay: Payer: Medicare Other | Attending: Radiation Oncology

## 2019-06-16 ENCOUNTER — Telehealth: Payer: Self-pay | Admitting: Family Medicine

## 2019-06-16 DIAGNOSIS — C61 Malignant neoplasm of prostate: Secondary | ICD-10-CM | POA: Insufficient documentation

## 2019-06-16 LAB — PSA: Prostatic Specific Antigen: 0.84 ng/mL (ref 0.00–4.00)

## 2019-06-16 NOTE — Telephone Encounter (Signed)
[facility] is requesting Medical Record for pt 06/05/19. They are requesting labs, immunization and all records for this day.      Phone: [phone]   Fax: [phone] - Attn: Dr. Spero Curb

## 2019-06-20 ENCOUNTER — Telehealth: Payer: Self-pay | Admitting: Family Medicine

## 2019-06-20 MED ORDER — SILDENAFIL CITRATE 20 MG PO TABS: 20.0000 mg | ORAL_TABLET | ORAL | 12 refills | Status: DC | PRN

## 2019-06-20 NOTE — Telephone Encounter (Signed)
Troy Christensen pt's wife presented in office stating that pt needs a refill on his sildenafil 20 MG.

## 2019-06-20 NOTE — Telephone Encounter (Signed)
Pt would like rx sent to [facility]

## 2019-06-20 NOTE — Telephone Encounter (Signed)
Pts wife came in to get release form so pts records can be transferred.

## 2019-06-23 ENCOUNTER — Other Ambulatory Visit: Payer: Self-pay | Admitting: *Deleted

## 2019-06-23 ENCOUNTER — Other Ambulatory Visit: Payer: Self-pay

## 2019-06-23 ENCOUNTER — Ambulatory Visit
Admission: RE | Admit: 2019-06-23 | Discharge: 2019-06-23 | Disposition: A | Discharge status: Home / Self Care | Payer: Medicare Other | Source: Ambulatory Visit | Attending: Radiation Oncology | Admitting: Radiation Oncology

## 2019-06-23 ENCOUNTER — Encounter: Payer: Self-pay | Admitting: Radiation Oncology

## 2019-06-23 VITALS — BP 121/71 | HR 58 | Temp 97.4°F | Resp 16 | Wt 194.4 lb

## 2019-06-23 DIAGNOSIS — C61 Malignant neoplasm of prostate: Secondary | ICD-10-CM | POA: Diagnosis not present

## 2019-06-23 DIAGNOSIS — Z923 Personal history of irradiation: Secondary | ICD-10-CM | POA: Diagnosis not present

## 2019-06-23 NOTE — Progress Notes (Signed)
Radiation Oncology Follow up Note  Name: Troy TENNY Sr.   Date:   06/23/2019 MRN:  [id number] DOB: 1951/04/21    This 68 y.o. 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  He specifically denies increased lower urinary tract symptoms diarrhea or fatigue.Marland Kitchen  His most recent PSA is 0.84 up slightly from 0.41-year prior.  Patient has CT scan back in June which I was reviewed shows no abnormality in the abdomen or pelvis.  CT scan of the head was also unremarkable.  COMPLICATIONS OF TREATMENT: none  FOLLOW UP COMPLIANCE: keeps appointments   PHYSICAL EXAM:  BP 121/71 (BP Location: Left Arm, Patient Position: Sitting)   Pulse (!) 58   Temp (!) 97.4 F (36.3 C) (Tympanic)   Resp 16   Wt 194 lb 6.4 oz (88.2 kg)   BMI 27.11 kg/m  Well-developed well-nourished patient in NAD. HEENT reveals PERLA, EOMI, discs not visualized.  Oral cavity is clear. No oral mucosal lesions are identified. Neck is clear without evidence of cervical or supraclavicular adenopathy. Lungs are clear to A&P. Cardiac examination is essentially unremarkable with regular rate and rhythm without murmur rub or thrill. Abdomen is benign with no organomegaly or masses noted. Motor sensory and DTR levels are equal and symmetric in the upper and lower extremities. Cranial nerves II through XII are grossly intact. Proprioception is intact. No peripheral adenopathy or edema is identified. No motor or sensory levels are noted. Crude visual fields are within normal range.  RADIOLOGY RESULTS: CT  scan of abdomen pelvis and brain reviewed compatible with above-stated findings  PLAN: This time patient has a slowly rising PSA still under 1.  I believe I can wait another year repeat his PSA should show an upward trend will make a determination whether to start pulsed androgen deprivation therapy.  Patient continues to be asymptomatic.  Follow-up appointments were made as well as drawing of PSA.  Patient knows to call with any concerns.  I would like to take this opportunity to thank you for allowing me to participate in the care of your patient.Noreene Filbert, MD

## 2019-06-29 ENCOUNTER — Ambulatory Visit: Payer: Self-pay | Admitting: Pharmacist

## 2019-06-29 NOTE — Chronic Care Management (AMB) (Signed)
  Chronic Care Management   Note  06/29/2019 Name: Troy MINUS Sr. MRN: [id number] DOB: 07/22/1951   Subjective:  Troy Starr Sr. is a 68 y.o. year old male who is a primary care patient of Crissman, Jeannette How, MD. The CCM team was consulted for assistance with chronic disease management and care coordination needs.    Met with patient today in clinic and helped demonstrate how to use his new FreeStyle glucometer. All questions answered.   Discussed CCM service. Patient DECLINED CCM referral at this time. Provided him with my contact information if he has future questions or changes his mind.   Catie Darnelle Maffucci, PharmD Clinical Pharmacist [place] [phone]

## 2019-08-04 ENCOUNTER — Encounter: Payer: Self-pay | Admitting: Family Medicine

## 2019-08-04 DIAGNOSIS — H401132 Primary open-angle glaucoma, bilateral, moderate stage: Secondary | ICD-10-CM | POA: Diagnosis not present

## 2019-08-22 DIAGNOSIS — D1801 Hemangioma of skin and subcutaneous tissue: Secondary | ICD-10-CM | POA: Diagnosis not present

## 2019-08-22 DIAGNOSIS — L821 Other seborrheic keratosis: Secondary | ICD-10-CM | POA: Diagnosis not present

## 2019-08-22 DIAGNOSIS — L578 Other skin changes due to chronic exposure to nonionizing radiation: Secondary | ICD-10-CM | POA: Diagnosis not present

## 2019-08-22 DIAGNOSIS — D225 Melanocytic nevi of trunk: Secondary | ICD-10-CM | POA: Diagnosis not present

## 2019-08-22 DIAGNOSIS — L814 Other melanin hyperpigmentation: Secondary | ICD-10-CM | POA: Diagnosis not present

## 2019-08-22 DIAGNOSIS — L570 Actinic keratosis: Secondary | ICD-10-CM | POA: Diagnosis not present

## 2019-08-22 DIAGNOSIS — Z1283 Encounter for screening for malignant neoplasm of skin: Secondary | ICD-10-CM | POA: Diagnosis not present

## 2019-09-30 ENCOUNTER — Ambulatory Visit (INDEPENDENT_AMBULATORY_CARE_PROVIDER_SITE_OTHER): Payer: Medicare Other | Admitting: Family Medicine

## 2019-09-30 ENCOUNTER — Other Ambulatory Visit: Payer: Self-pay

## 2019-09-30 ENCOUNTER — Encounter: Payer: Self-pay | Admitting: Family Medicine

## 2019-09-30 VITALS — BP 138/73 | HR 58 | Temp 97.8°F | Ht 70.0 in | Wt 202.4 lb

## 2019-09-30 DIAGNOSIS — E785 Hyperlipidemia, unspecified: Secondary | ICD-10-CM | POA: Diagnosis not present

## 2019-09-30 DIAGNOSIS — E7800 Pure hypercholesterolemia, unspecified: Secondary | ICD-10-CM | POA: Diagnosis not present

## 2019-09-30 DIAGNOSIS — T560X1D Toxic effect of lead and its compounds, accidental (unintentional), subsequent encounter: Secondary | ICD-10-CM | POA: Diagnosis not present

## 2019-09-30 DIAGNOSIS — I10 Essential (primary) hypertension: Secondary | ICD-10-CM

## 2019-09-30 DIAGNOSIS — E119 Type 2 diabetes mellitus without complications: Secondary | ICD-10-CM | POA: Diagnosis not present

## 2019-09-30 DIAGNOSIS — M1A1790 Lead-induced chronic gout, unspecified ankle and foot, without tophus (tophi): Secondary | ICD-10-CM

## 2019-09-30 DIAGNOSIS — C61 Malignant neoplasm of prostate: Secondary | ICD-10-CM | POA: Diagnosis not present

## 2019-09-30 DIAGNOSIS — E1129 Type 2 diabetes mellitus with other diabetic kidney complication: Secondary | ICD-10-CM | POA: Insufficient documentation

## 2019-09-30 DIAGNOSIS — E118 Type 2 diabetes mellitus with unspecified complications: Secondary | ICD-10-CM | POA: Diagnosis not present

## 2019-09-30 LAB — UA/M W/RFLX CULTURE, ROUTINE
Bilirubin, UA: NEGATIVE
Ketones, UA: NEGATIVE
Leukocytes,UA: NEGATIVE
Nitrite, UA: NEGATIVE
Protein,UA: NEGATIVE
RBC, UA: NEGATIVE
Specific Gravity, UA: 1.02 (ref 1.005–1.030)
Urobilinogen, Ur: 0.2 mg/dL (ref 0.2–1.0)
pH, UA: 5 (ref 5.0–7.5)

## 2019-09-30 LAB — MICROALBUMIN, URINE WAIVED
Creatinine, Urine Waived: 100 mg/dL (ref 10–300)
Microalb, Ur Waived: 10 mg/L (ref 0–19)
Microalb/Creat Ratio: <30 mg/g (ref <30)

## 2019-09-30 LAB — BAYER DCA HB A1C WAIVED: HB A1C (BAYER DCA - WAIVED): 7.2 % — ABNORMAL HIGH (ref <7.0)

## 2019-09-30 NOTE — Assessment & Plan Note (Signed)
Under good control on current regimen. Continue current regimen. Continue to monitor. Call with any concerns. Refills given. Labs drawn today.   

## 2019-09-30 NOTE — Assessment & Plan Note (Signed)
Up a little with A1c of 7.2- will work on diet and continue current regimen. Recheck 3 months.

## 2019-09-30 NOTE — Assessment & Plan Note (Signed)
Followed by radiation. Stable. Continue to monitor. Call with any concerns.

## 2019-09-30 NOTE — Patient Instructions (Signed)

## 2019-09-30 NOTE — Progress Notes (Signed)
BP 138/73   Pulse (!) 58   Temp 97.8 F (36.6 C) (Oral)   Ht _0  (1.778 m)   Wt 202 lb 6.4 oz (91.8 kg)   SpO2 99%   BMI 29.04 kg/m    Subjective:    Patient ID: Troy Starr Sr., male    DOB: [birth year], 69 y.o.   MRN: [id number]  HPI: Troy EDEN Sr. is a 69 y.o. male  Chief Complaint  Patient presents with  . Diabetes    pt states he had a recent eye exam at [facility], will call for report  . Hyperlipidemia  . Hypertension   DIABETES Hypoglycemic episodes:no Polydipsia/polyuria: no Visual disturbance: no Chest pain: no Paresthesias: no Glucose Monitoring: yes  Accucheck frequency: Daily Taking Insulin?: no Blood Pressure Monitoring: not checking Retinal Examination: Up to Date Foot Exam: Done today Diabetic Education: Completed Pneumovax: Up to Date Influenza: Up to Date Aspirin: yes  HYPERTENSION / HYPERLIPIDEMIA Satisfied with current treatment? yes Duration of hypertension: chronic BP monitoring frequency: not checking BP medication side effects: no Past BP meds: amlodipine, benazepril Duration of hyperlipidemia: chronic Cholesterol medication side effects: no Cholesterol supplements: none Past cholesterol medications: lovastatin Medication compliance: excellent compliance Aspirin: no Recent stressors: no Recurrent headaches: no Visual changes: no Palpitations: no Dyspnea: no Chest pain: no Lower extremity edema: no Dizzy/lightheaded: no  Gout- doing well. Tolerating allopurinol. No flares.   Relevant past medical, surgical, family and social history reviewed and updated as indicated. Interim medical history since our last visit reviewed. Allergies and medications reviewed and updated.  Review of Systems  Constitutional: Negative.   HENT: Negative.   Respiratory: Negative.   Cardiovascular: Negative.   Musculoskeletal: Negative.   Psychiatric/Behavioral: Negative.     Per HPI unless specifically indicated above       Objective:    BP 138/73   Pulse (!) 58   Temp 97.8 F (36.6 C) (Oral)   Ht _1  (1.778 m)   Wt 202 lb 6.4 oz (91.8 kg)   SpO2 99%   BMI 29.04 kg/m   Wt Readings from Last 3 Encounters:  09/30/19 202 lb 6.4 oz (91.8 kg)  06/23/19 194 lb 6.4 oz (88.2 kg)  03/11/19 190 lb (86.2 kg)    Physical Exam Vitals and nursing note reviewed.  Constitutional:      General: He is not in acute distress.    Appearance: Normal appearance. He is not ill-appearing, toxic-appearing or diaphoretic.  HENT:     Head: Normocephalic and atraumatic.     Right Ear: External ear normal.     Left Ear: External ear normal.     Nose: Nose normal.     Mouth/Throat:     Mouth: Mucous membranes are moist.     Pharynx: Oropharynx is clear.  Eyes:     General: No scleral icterus.       Right eye: No discharge.        Left eye: No discharge.     Extraocular Movements: Extraocular movements intact.     Conjunctiva/sclera: Conjunctivae normal.     Pupils: Pupils are equal, round, and reactive to light.  Cardiovascular:     Rate and Rhythm: Normal rate and regular rhythm.     Pulses: Normal pulses.     Heart sounds: Normal heart sounds. No murmur. No friction rub. No gallop.   Pulmonary:     Effort: Pulmonary effort is normal. No respiratory distress.     Breath  sounds: Normal breath sounds. No stridor. No wheezing, rhonchi or rales.  Chest:     Chest wall: No tenderness.  Musculoskeletal:        General: Normal range of motion.     Cervical back: Normal range of motion and neck supple.  Skin:    General: Skin is warm and dry.     Capillary Refill: Capillary refill takes less than 2 seconds.     Coloration: Skin is not jaundiced or pale.     Findings: No bruising, erythema, lesion or rash.  Neurological:     General: No focal deficit present.     Mental Status: He is alert and oriented to person, place, and time. Mental status is at baseline.  Psychiatric:        Mood and Affect: Mood normal.         Behavior: Behavior normal.        Thought Content: Thought content normal.        Judgment: Judgment normal.     Results for orders placed or performed in visit on 06/16/19  PSA  Result Value Ref Range   Prostatic Specific Antigen 0.84 0.00 - 4.00 ng/mL      Assessment & Plan:   Problem List Items Addressed This Visit      Cardiovascular and Mediastinum   Hypertension - Primary    Under good control on current regimen. Continue current regimen. Continue to monitor. Call with any concerns. Refills given. Labs drawn today.       Relevant Orders   CBC with Differential OUT   Comp Met (CMET)   Microalbumin, Urine Waived   TSH   UA/M w/rflx Culture, Routine     Endocrine   Controlled diabetes mellitus type 2 with complications ([place])    Up a little with A1c of 7.2- will work on diet and continue current regimen. Recheck 3 months.       Relevant Orders   Bayer DCA Hb A1c Waived   CBC with Differential OUT   Comp Met (CMET)   Microalbumin, Urine Waived   UA/M w/rflx Culture, Routine     Genitourinary   Prostate cancer ([place])    Followed by radiation. Stable. Continue to monitor. Call with any concerns.       Relevant Orders   CBC with Differential OUT   Comp Met (CMET)   PSA   UA/M w/rflx Culture, Routine     Other   Hyperlipidemia    Under good control on current regimen. Continue current regimen. Continue to monitor. Call with any concerns. Refills given. Labs drawn today.      Relevant Orders   CBC with Differential OUT   Comp Met (CMET)   Lipid Panel w/o Chol/HDL Ratio OUT   UA/M w/rflx Culture, Routine   Gout    Under good control on current regimen. Continue current regimen. Continue to monitor. Call with any concerns. Refills given. Labs drawn today.      Relevant Orders   CBC with Differential OUT   Comp Met (CMET)   UA/M w/rflx Culture, Routine   Uric acid    Other Visit Diagnoses    Type 2 diabetes mellitus without complication, without  long-term current use of insulin ([place])           Follow up plan: Return in about 3 months (around 12/29/2019).

## 2019-10-01 LAB — LIPID PANEL W/O CHOL/HDL RATIO
Cholesterol, Total: 174 mg/dL (ref 100–199)
HDL: 51 mg/dL (ref >39)
LDL Chol Calc (NIH): 85 mg/dL (ref 0–99)
Triglycerides: 229 mg/dL — ABNORMAL HIGH (ref 0–149)
VLDL Cholesterol Cal: 38 mg/dL (ref 5–40)

## 2019-10-01 LAB — CBC WITH DIFFERENTIAL/PLATELET
Basophils Absolute: 0 10*3/uL (ref 0.0–0.2)
Basos: 1 %
EOS (ABSOLUTE): 0.2 10*3/uL (ref 0.0–0.4)
Eos: 6 %
Hematocrit: 39.8 % (ref 37.5–51.0)
Hemoglobin: 13.7 g/dL (ref 13.0–17.7)
Immature Grans (Abs): 0 10*3/uL (ref 0.0–0.1)
Immature Granulocytes: 1 %
Lymphocytes Absolute: 1 10*3/uL (ref 0.7–3.1)
Lymphs: 28 %
MCH: 33 pg (ref 26.6–33.0)
MCHC: 34.4 g/dL (ref 31.5–35.7)
MCV: 96 fL (ref 79–97)
Monocytes Absolute: 0.3 10*3/uL (ref 0.1–0.9)
Monocytes: 9 %
Neutrophils Absolute: 1.9 10*3/uL (ref 1.4–7.0)
Neutrophils: 55 %
Platelets: 167 10*3/uL (ref 150–450)
RBC: 4.15 x10E6/uL (ref 4.14–5.80)
RDW: 13.3 % (ref 11.6–15.4)
WBC: 3.5 10*3/uL (ref 3.4–10.8)

## 2019-10-01 LAB — COMPREHENSIVE METABOLIC PANEL
ALT: 20 IU/L (ref 0–44)
AST: 19 IU/L (ref 0–40)
Albumin/Globulin Ratio: 2.2 (ref 1.2–2.2)
Albumin: 4.6 g/dL (ref 3.8–4.8)
Alkaline Phosphatase: 62 IU/L (ref 39–117)
BUN/Creatinine Ratio: 16 (ref 10–24)
BUN: 19 mg/dL (ref 8–27)
Bilirubin Total: 0.3 mg/dL (ref 0.0–1.2)
CO2: 22 mmol/L (ref 20–29)
Calcium: 9.6 mg/dL (ref 8.6–10.2)
Chloride: 103 mmol/L (ref 96–106)
Creatinine, Ser: 1.22 mg/dL (ref 0.76–1.27)
GFR calc Af Amer: 70 mL/min/{1.73_m2} (ref >59)
GFR calc non Af Amer: 61 mL/min/{1.73_m2} (ref >59)
Globulin, Total: 2.1 g/dL (ref 1.5–4.5)
Glucose: 156 mg/dL — ABNORMAL HIGH (ref 65–99)
Potassium: 4.6 mmol/L (ref 3.5–5.2)
Sodium: 140 mmol/L (ref 134–144)
Total Protein: 6.7 g/dL (ref 6.0–8.5)

## 2019-10-01 LAB — URIC ACID: Uric Acid: 5.7 mg/dL (ref 3.8–8.4)

## 2019-10-01 LAB — PSA: Prostate Specific Ag, Serum: 1.2 ng/mL (ref 0.0–4.0)

## 2019-10-01 LAB — TSH: TSH: 1.38 u[IU]/mL (ref 0.450–4.500)

## 2019-10-13 ENCOUNTER — Telehealth: Payer: Self-pay

## 2019-10-13 DIAGNOSIS — M542 Cervicalgia: Secondary | ICD-10-CM

## 2019-10-13 DIAGNOSIS — M25512 Pain in left shoulder: Secondary | ICD-10-CM

## 2019-10-13 NOTE — Telephone Encounter (Signed)
Spoke with Pt, he stated the exercises he was given to do for his neck pain have not helped. Pt thinks he may have a pinched nerve and wants to discuss a referral for Physical Therapy.

## 2019-10-16 NOTE — Telephone Encounter (Signed)
Referral to PT made

## 2019-10-16 NOTE — Addendum Note (Signed)
Addended by: Valerie Roys on: 10/16/2019 03:15 PM   Modules accepted: Orders

## 2019-10-18 ENCOUNTER — Telehealth: Payer: Self-pay | Admitting: Family Medicine

## 2019-10-18 NOTE — Telephone Encounter (Signed)
Pt,is here stating that he would like a referral to [facility] on  [address] that he has been going there his whole life and would like to go there.

## 2019-10-18 NOTE — Telephone Encounter (Signed)
Please redirect PT referral as below. Thanks.

## 2019-10-25 ENCOUNTER — Telehealth: Payer: Self-pay | Admitting: Family Medicine

## 2019-10-25 NOTE — Telephone Encounter (Signed)
Messaged referral coordinator to check on the status of referral.

## 2019-10-25 NOTE — Telephone Encounter (Signed)
Pt's wife is here inquiring about the referral to [facility], states she has not hear anything back from them I explained to her the process and she still wanted me to send provider/CMA a message about husband not having referral placed yet . A message on this was already sent on 10/18/19

## 2019-10-25 NOTE — Telephone Encounter (Signed)
Referral was sent to [facility] per referral coordinator [place]. Called patient and he stated that he wanted it to go to [facility] on [address]. Let Langley Gauss know that so referral could be resent. Apologized to the patient for the confusion and let him know that the referral was being sent this afternoon.

## 2019-10-31 DIAGNOSIS — M542 Cervicalgia: Secondary | ICD-10-CM | POA: Diagnosis not present

## 2019-11-02 ENCOUNTER — Ambulatory Visit: Payer: Medicare Other

## 2019-11-03 DIAGNOSIS — M542 Cervicalgia: Secondary | ICD-10-CM | POA: Diagnosis not present

## 2019-11-08 DIAGNOSIS — M542 Cervicalgia: Secondary | ICD-10-CM | POA: Diagnosis not present

## 2019-11-10 DIAGNOSIS — M542 Cervicalgia: Secondary | ICD-10-CM | POA: Diagnosis not present

## 2019-11-14 DIAGNOSIS — M542 Cervicalgia: Secondary | ICD-10-CM | POA: Diagnosis not present

## 2019-11-15 ENCOUNTER — Other Ambulatory Visit: Payer: Self-pay | Admitting: Family Medicine

## 2019-11-15 DIAGNOSIS — E119 Type 2 diabetes mellitus without complications: Secondary | ICD-10-CM

## 2019-11-15 MED ORDER — METFORMIN HCL ER 500 MG PO TB24: 1000.0000 mg | ORAL_TABLET | Freq: Two times a day (BID) | ORAL | 1 refills | Status: DC

## 2019-11-15 NOTE — Telephone Encounter (Signed)
Patient last seen 09/30/19 and has appointment 01/09/20

## 2019-11-15 NOTE — Telephone Encounter (Signed)
Pt wife is here stating Pt is out of metformin and would like [facility] to [facility].

## 2019-11-17 DIAGNOSIS — M542 Cervicalgia: Secondary | ICD-10-CM | POA: Diagnosis not present

## 2019-11-21 DIAGNOSIS — M542 Cervicalgia: Secondary | ICD-10-CM | POA: Diagnosis not present

## 2019-11-22 DIAGNOSIS — H524 Presbyopia: Secondary | ICD-10-CM | POA: Diagnosis not present

## 2019-11-22 DIAGNOSIS — E119 Type 2 diabetes mellitus without complications: Secondary | ICD-10-CM | POA: Diagnosis not present

## 2019-11-22 DIAGNOSIS — H33012 Retinal detachment with single break, left eye: Secondary | ICD-10-CM | POA: Diagnosis not present

## 2019-11-22 DIAGNOSIS — H401132 Primary open-angle glaucoma, bilateral, moderate stage: Secondary | ICD-10-CM | POA: Diagnosis not present

## 2019-11-22 DIAGNOSIS — H33052 Total retinal detachment, left eye: Secondary | ICD-10-CM | POA: Diagnosis not present

## 2019-11-22 LAB — HM DIABETES EYE EXAM

## 2019-11-24 DIAGNOSIS — H33022 Retinal detachment with multiple breaks, left eye: Secondary | ICD-10-CM | POA: Diagnosis not present

## 2019-11-25 DIAGNOSIS — H33012 Retinal detachment with single break, left eye: Secondary | ICD-10-CM | POA: Diagnosis not present

## 2019-12-02 DIAGNOSIS — H33012 Retinal detachment with single break, left eye: Secondary | ICD-10-CM | POA: Diagnosis not present

## 2019-12-05 DIAGNOSIS — M542 Cervicalgia: Secondary | ICD-10-CM | POA: Diagnosis not present

## 2019-12-08 DIAGNOSIS — M542 Cervicalgia: Secondary | ICD-10-CM | POA: Diagnosis not present

## 2019-12-12 DIAGNOSIS — M542 Cervicalgia: Secondary | ICD-10-CM | POA: Diagnosis not present

## 2019-12-15 DIAGNOSIS — M542 Cervicalgia: Secondary | ICD-10-CM | POA: Diagnosis not present

## 2019-12-20 DIAGNOSIS — M542 Cervicalgia: Secondary | ICD-10-CM | POA: Diagnosis not present

## 2019-12-22 DIAGNOSIS — M542 Cervicalgia: Secondary | ICD-10-CM | POA: Diagnosis not present

## 2019-12-26 DIAGNOSIS — M542 Cervicalgia: Secondary | ICD-10-CM | POA: Diagnosis not present

## 2019-12-27 DIAGNOSIS — H33012 Retinal detachment with single break, left eye: Secondary | ICD-10-CM | POA: Diagnosis not present

## 2019-12-28 DIAGNOSIS — H33012 Retinal detachment with single break, left eye: Secondary | ICD-10-CM | POA: Diagnosis not present

## 2019-12-29 DIAGNOSIS — H3522 Other non-diabetic proliferative retinopathy, left eye: Secondary | ICD-10-CM | POA: Diagnosis not present

## 2019-12-29 DIAGNOSIS — H33022 Retinal detachment with multiple breaks, left eye: Secondary | ICD-10-CM | POA: Diagnosis not present

## 2019-12-30 DIAGNOSIS — H33012 Retinal detachment with single break, left eye: Secondary | ICD-10-CM | POA: Diagnosis not present

## 2020-01-03 ENCOUNTER — Ambulatory Visit: Payer: Medicare Other | Admitting: Family Medicine

## 2020-01-06 DIAGNOSIS — H33012 Retinal detachment with single break, left eye: Secondary | ICD-10-CM | POA: Diagnosis not present

## 2020-01-09 ENCOUNTER — Ambulatory Visit: Payer: Self-pay

## 2020-01-09 ENCOUNTER — Encounter: Payer: Self-pay | Admitting: Family Medicine

## 2020-01-13 ENCOUNTER — Telehealth: Payer: Self-pay | Admitting: Family Medicine

## 2020-01-13 DIAGNOSIS — I10 Essential (primary) hypertension: Secondary | ICD-10-CM

## 2020-01-13 DIAGNOSIS — M1A1790 Lead-induced chronic gout, unspecified ankle and foot, without tophus (tophi): Secondary | ICD-10-CM

## 2020-01-13 DIAGNOSIS — E119 Type 2 diabetes mellitus without complications: Secondary | ICD-10-CM

## 2020-01-13 DIAGNOSIS — E7800 Pure hypercholesterolemia, unspecified: Secondary | ICD-10-CM

## 2020-01-13 MED ORDER — AMLODIPINE BESY-BENAZEPRIL HCL 5-20 MG PO CAPS: ORAL_CAPSULE | ORAL | 0 refills | Status: DC

## 2020-01-13 MED ORDER — FARXIGA 10 MG PO TABS: 10.0000 mg | ORAL_TABLET | Freq: Every day | ORAL | 0 refills | Status: DC

## 2020-01-13 MED ORDER — LOVASTATIN 40 MG PO TABS: 40.0000 mg | ORAL_TABLET | Freq: Every day | ORAL | 0 refills | Status: DC

## 2020-01-13 MED ORDER — ALLOPURINOL 100 MG PO TABS: 100.0000 mg | ORAL_TABLET | Freq: Every day | ORAL | 0 refills | Status: DC

## 2020-01-13 MED ORDER — PIOGLITAZONE HCL 45 MG PO TABS: 45.0000 mg | ORAL_TABLET | Freq: Every day | ORAL | 0 refills | Status: DC

## 2020-01-13 MED ORDER — SITAGLIPTIN PHOSPHATE 100 MG PO TABS: 100.0000 mg | ORAL_TABLET | Freq: Every day | ORAL | 0 refills | Status: DC

## 2020-01-13 NOTE — Telephone Encounter (Signed)
Pt is here stating he needs RX for Actos Pt states he needs it sent to [facility]. Please advise.

## 2020-01-13 NOTE — Telephone Encounter (Signed)
Last filled in 2020

## 2020-01-25 ENCOUNTER — Other Ambulatory Visit: Payer: Self-pay

## 2020-01-25 ENCOUNTER — Ambulatory Visit (INDEPENDENT_AMBULATORY_CARE_PROVIDER_SITE_OTHER): Payer: Medicare Other

## 2020-01-25 VITALS — BP 135/79 | HR 68 | Temp 97.5°F | Ht 70.0 in | Wt 202.4 lb

## 2020-01-25 DIAGNOSIS — Z0000 Encounter for general adult medical examination without abnormal findings: Secondary | ICD-10-CM

## 2020-01-25 NOTE — Progress Notes (Signed)
Subjective:   OLEN CHRISTODOULOU Sr. is a 69 y.o. male who presents for Medicare Annual/Subsequent preventive examination.  Review of Systems:   Cardiac Risk Factors include: advanced age (>49men, >68 women);male gender;diabetes mellitus;dyslipidemia;hypertension     Objective:    Vitals: BP 135/79 (BP Location: Left Arm, Patient Position: Sitting)   Pulse 68   Temp (!) 97.5 F (36.4 C) (Temporal)   Ht 5\' 10"  (1.778 m)   Wt 202 lb 6.4 oz (91.8 kg)   BMI 29.04 kg/m   Body mass index is 29.04 kg/m.  Advanced Directives 06/23/2019 03/11/2019 10/25/2018 06/14/2018 10/15/2017 05/28/2017 01/10/2016  Does Patient Have a Medical Advance Directive? No Yes Yes No Yes No Yes  Type of Advance Directive - Living will Living will;Healthcare Power of [place];Living will - Living will;Healthcare Power of Attorney  Does patient want to make changes to medical advance directive? - - - - - - -  Copy of [place] in Chart? - - No - copy requested - No - copy requested - No - copy requested  Would patient like information on creating a medical advance directive? No - Patient declined - - No - Patient declined - No - Patient declined -    Tobacco Social History   Tobacco Use  Smoking Status Former Smoker  . Types: Cigarettes  . Quit date: 03/26/1975  . Years since quitting: 44.8  Smokeless Tobacco Never Used     Counseling given: Not Answered   Clinical Intake:  Pre-visit preparation completed: Yes  Pain : No/denies pain     Nutritional Risks: None Diabetes: No  How often do you need to have someone help you when you read instructions, pamphlets, or other written materials from your doctor or pharmacy?: 1 - Never  Nutrition Risk Assessment:  Has the patient had any N/V/D within the last 2 months?  No  Does the patient have any non-healing wounds?  No  Has the patient had any unintentional weight loss or weight gain?  No    Diabetes:  Is the patient diabetic?  Yes  If diabetic, was a CBG obtained today?  No  Did the patient bring in their glucometer from home?  No  How often do you monitor your CBG's? 2-3 times a week   Financial Strains and Diabetes Management:  Are you having any financial strains with the device, your supplies or your medication? No .  Does the patient want to be seen by Chronic Care Management for management of their diabetes?  No  Would the patient like to be referred to a Nutritionist or for Diabetic Management?  No   Diabetic Exams:  Diabetic Eye Exam: Completed 11/22/2019.   Diabetic Foot Exam: Completed 09/30/2019.   Interpreter Needed?: No  Information entered by :: Jaynie Hitch,LPN  Past Medical History:  Diagnosis Date  . Cancer [facility])    Prostate  . Diabetes mellitus without complication ([place])   . Glaucoma   . Gout   . Hypercholesteremia   . Hypertension   . Osteoarthritis    hands  . Prostate cancer [facility])    Past Surgical History:  Procedure Laterality Date  . EYE SURGERY Bilateral    march and april 2021- [place]   . FLEXIBLE SIGMOIDOSCOPY N/A 01/10/2016   Procedure: FLEXIBLE SIGMOIDOSCOPY with  argon plasma coagulation;  Surgeon: Lucilla Lame, MD;  Location: [facility];  Service: Endoscopy;  Laterality: N/A;  Diabetic - oral meds  .  HERNIA REPAIR    . JOINT REPLACEMENT  2014   Right Knee Replacement  . MENISCUS REPAIR Left   . nephrolithiasis     pt denies  . PROSTATE SURGERY     Radiation treatments - no surgery  . ROTATOR CUFF REPAIR Right 2012   Family History  Problem Relation Age of Onset  . Cancer Father 25       lung  . Emphysema Mother   . Dementia Mother   . Cancer Brother        melanoma   Social History   Socioeconomic History  . Marital status: Married    Spouse name: Not on file  . Number of children: Not on file  . Years of education: Not on file  . Highest education level: Not on file  Occupational History  .  Occupation: retired  Tobacco Use  . Smoking status: Former Smoker    Types: Cigarettes    Quit date: 03/26/1975    Years since quitting: 44.8  . Smokeless tobacco: Never Used  Substance and Sexual Activity  . Alcohol use: No    Alcohol/week: 0.0 standard drinks  . Drug use: No  . Sexual activity: Not on file  Other Topics Concern  . Not on file  Social History Narrative  . Not on file   Social Determinants of Health   Financial Resource Strain: Low Risk   . Difficulty of Paying Living Expenses: Not hard at all  Food Insecurity: No Food Insecurity  . Worried About Charity fundraiser in the Last Year: Never true  . Ran Out of Food in the Last Year: Never true  Transportation Needs: No Transportation Needs  . Lack of Transportation (Medical): No  . Lack of Transportation (Non-Medical): No  Physical Activity: Inactive  . Days of Exercise per Week: 0 days  . Minutes of Exercise per Session: 0 min  Stress:   . Feeling of Stress :   Social Connections: Slightly Isolated  . Frequency of Communication with Friends and Family: More than three times a week  . Frequency of Social Gatherings with Friends and Family: More than three times a week  . Attends Religious Services: More than 4 times per year  . Active Member of Clubs or Organizations: No  . Attends Archivist Meetings: Never  . Marital Status: Married    Outpatient Encounter Medications as of 01/25/2020  Medication Sig  . allopurinol (ZYLOPRIM) 100 MG tablet Take 1 tablet (100 mg total) by mouth daily.  Marland Kitchen amLODipine-benazepril (LOTREL) 5-20 MG capsule TAKE 2 CAPSULES ONCE EACH DAY  . aspirin EC 81 MG tablet Take 81 mg by mouth daily.  . dapagliflozin propanediol (FARXIGA) 10 MG TABS tablet Take 10 mg by mouth daily.  Marland Kitchen glucose blood test strip 1 each by Other route as needed for other. Use contour next test strip to check sugar 2 times daily  . lovastatin (MEVACOR) 40 MG tablet Take 1 tablet (40 mg total) by  mouth daily.  . metFORMIN (GLUCOPHAGE-XR) 500 MG 24 hr tablet Take 2 tablets (1,000 mg total) by mouth 2 (two) times daily.  . pioglitazone (ACTOS) 45 MG tablet Take 1 tablet (45 mg total) by mouth daily.  . sildenafil (REVATIO) 20 MG tablet Take 1-5 tablets (20-100 mg total) by mouth as needed.  . sitaGLIPtin (JANUVIA) 100 MG tablet Take 1 tablet (100 mg total) by mouth daily.   No facility-administered encounter medications on file as of 01/25/2020.  Activities of Daily Living In your present state of health, do you have any difficulty performing the following activities: 01/25/2020  Hearing? N  Comment no hearing aids  Vision? Y  Comment eyeglasses,recent surgery  Difficulty concentrating or making decisions? N  Walking or climbing stairs? N  Dressing or bathing? N  Doing errands, shopping? Y  Comment wife is currently driving due to vision/ reccent surgery  Preparing Food and eating ? N  Using the Toilet? N  In the past six months, have you accidently leaked urine? N  Do you have problems with loss of bowel control? N  Managing your Medications? N  Managing your Finances? N  Housekeeping or managing your Housekeeping? N  Some recent data might be hidden    Patient Care Team: Valerie Roys, DO as PCP - General (Family Medicine)   Assessment:   This is a routine wellness examination for [place].  Exercise Activities and Dietary recommendations Current Exercise Habits: The patient does not participate in regular exercise at present, Exercise limited by: None identified  Goals Addressed   None     Fall Risk Fall Risk  01/25/2020 10/25/2018 01/13/2018 01/11/2018 10/15/2017  Falls in the past year? 0 0 No No No  Number falls in past yr: 0 0 - - -  Injury with Fall? 0 - - - -   FALL RISK PREVENTION PERTAINING TO THE HOME:  Any stairs in or around the home? Yes  If so, are there any without handrails? No   Home free of loose throw rugs in walkways, pet beds, electrical  cords, etc? Yes  Adequate lighting in your home to reduce risk of falls? Yes   ASSISTIVE DEVICES UTILIZED TO PREVENT FALLS:  Life alert? No  Use of a cane, walker or w/c? No  Grab bars in the bathroom? No  Shower chair or bench in shower? No  Elevated toilet seat or a handicapped toilet? No    TIMED UP AND GO:  Was the test performed? Yes .  Length of time to ambulate 10 feet: 8 sec.   GAIT:  Appearance of gait: Gait steady and fast without the use of an assistive device. Education: Fall risk prevention has been discussed.  Intervention(s) required? No   DME/home health order needed?  No    Depression Screen PHQ 2/9 Scores 01/25/2020 10/25/2018 01/11/2018 10/15/2017  PHQ - 2 Score 0 0 0 0  PHQ- 9 Score - - - -    Cognitive Function     6CIT Screen 10/25/2018 10/15/2017  What Year? 0 points 0 points  What month? 0 points 0 points  What time? 0 points 0 points  Count back from 20 0 points 0 points  Months in reverse 0 points 0 points  Repeat phrase 0 points 0 points  Total Score 0 0    Immunization History  Administered Date(s) Administered  . Fluad Quad(high Dose 65+) 06/09/2019  . Influenza, High Dose Seasonal PF 07/21/2016, 07/17/2017, 06/21/2018  . Influenza,inj,Quad PF,6+ Mos 06/26/2015  . Influenza,inj,quad, With Preservative 06/15/2016  . Moderna SARS-COVID-2 Vaccination 10/27/2019, 11/26/2019  . Pneumococcal Conjugate-13 09/24/2016  . Pneumococcal Polysaccharide-23 10/15/2017  . Tdap 08/19/2011  . Zoster 10/05/2013    Qualifies for Shingles Vaccine? Yes  Zostavax completed 10/05/2013. Due for Shingrix. Education has been provided regarding the importance of this vaccine. Pt has been advised to call insurance company to determine out of pocket expense. Advised may also receive vaccine at local pharmacy or Health Dept. Verbalized  acceptance and understanding.  Tdap: up to date   Flu Vaccine: up to date   Pneumococcal Vaccine: up to date   Covid-19  Vaccine: Completed vaccines  Screening Tests Health Maintenance  Topic Date Due  . HEMOGLOBIN A1C  03/29/2020  . INFLUENZA VACCINE  04/15/2020  . FOOT EXAM  09/29/2020  . OPHTHALMOLOGY EXAM  11/21/2020  . TETANUS/TDAP  08/18/2021  . COLONOSCOPY  12/20/2024  . COVID-19 Vaccine  Completed  . Hepatitis C Screening  Completed  . PNA vac Low Risk Adult  Completed   Cancer Screenings:  Colorectal Screening: Completed 12/21/2014. Repeat every 10 years  Lung Cancer Screening: (Low Dose CT Chest recommended if Age 32-80 years, 30 pack-year currently smoking OR have quit w/in 15years.) does not qualify.   Additional Screening:  Hepatitis C Screening: does qualify; Completed 10/15/2017  Dental Screening: Recommended annual dental exams for proper oral hygiene  Community Resource Referral:  CRR required this visit?  No        Plan:  I have personally reviewed and addressed the Medicare Annual Wellness questionnaire and have noted the following in the patient's chart:  A. Medical and social history B. Use of alcohol, tobacco or illicit drugs  C. Current medications and supplements D. Functional ability and status E.  Nutritional status F.  Physical activity G. Advance directives H. List of other physicians I.  Hospitalizations, surgeries, and ER visits in previous 12 months J.  [place] such as hearing and vision if needed, cognitive and depression L. Referrals and appointments   In addition, I have reviewed and discussed with patient certain preventive protocols, quality metrics, and best practice recommendations. A written personalized care plan for preventive services as well as general preventive health recommendations were provided to patient.   Signed,   Bevelyn Ngo, LPN  [id number] Nurse Health Advisor  Nurse Notes: none

## 2020-01-25 NOTE — Patient Instructions (Signed)
[name] , Thank you for taking time to come for your Medicare Wellness Visit. I appreciate your ongoing commitment to your health goals. Please review the following plan we discussed and let me know if I can assist you in the future.   Screening recommendations/referrals: Colonoscopy: completed 12/21/2014, due 2026 Recommended yearly ophthalmology/optometry visit for glaucoma screening and checkup Recommended yearly dental visit for hygiene and checkup  Vaccinations: Influenza vaccine: due 05/2020 Pneumococcal vaccine: completed series  Tdap vaccine: completed 2012 Shingles vaccine: shingrix eligible    Covid-19: completed   Advanced directives: Please bring a copy of your health care power of attorney and living will to the office at your convenience.  Conditions/risks identified: diabetic, discussed chronic care management team.  Next appointment: Follow up in one year for your annual wellness visit.   Preventive Care 47 Years and Older, Male Preventive care refers to lifestyle choices and visits with your health care provider that can promote health and wellness. What does preventive care include?  A yearly physical exam. This is also called an annual well check.  Dental exams once or twice a year.  Routine eye exams. Ask your health care provider how often you should have your eyes checked.  Personal lifestyle choices, including:  Daily care of your teeth and gums.  Regular physical activity.  Eating a healthy diet.  Avoiding tobacco and drug use.  Limiting alcohol use.  Practicing safe sex.  Taking low doses of aspirin every day.  Taking vitamin and mineral supplements as recommended by your health care provider. What happens during an annual well check? The services and screenings done by your health care provider during your annual well check will depend on your age, overall health, lifestyle risk factors, and family history of disease. Counseling    Your health care provider may ask you questions about your:  Alcohol use.  Tobacco use.  Drug use.  Emotional well-being.  Home and relationship well-being.  Sexual activity.  Eating habits.  History of falls.  Memory and ability to understand (cognition).  Work and work Statistician. Screening  You may have the following tests or measurements:  Height, weight, and BMI.  Blood pressure.  Lipid and cholesterol levels. These may be checked every 5 years, or more frequently if you are over 72 years old.  Skin check.  Lung cancer screening. You may have this screening every year starting at age 33 if you have a 30-pack-year history of smoking and currently smoke or have quit within the past 15 years.  Fecal occult blood test (FOBT) of the stool. You may have this test every year starting at age 77.  Flexible sigmoidoscopy or colonoscopy. You may have a sigmoidoscopy every 5 years or a colonoscopy every 10 years starting at age 14.  Prostate cancer screening. Recommendations will vary depending on your family history and other risks.  Hepatitis C blood test.  Hepatitis B blood test.  Sexually transmitted disease (STD) testing.  Diabetes screening. This is done by checking your blood sugar (glucose) after you have not eaten for a while (fasting). You may have this done every 1-3 years.  Abdominal aortic aneurysm (AAA) screening. You may need this if you are a current or former smoker.  Osteoporosis. You may be screened starting at age 3 if you are at high risk. Talk with your health care provider about your test results, treatment options, and if necessary, the need for more tests. Vaccines  Your health care provider may recommend  certain vaccines, such as:  Influenza vaccine. This is recommended every year.  Tetanus, diphtheria, and acellular pertussis (Tdap, Td) vaccine. You may need a Td booster every 10 years.  Zoster vaccine. You may need this after age  52.  Pneumococcal 13-valent conjugate (PCV13) vaccine. One dose is recommended after age 28.  Pneumococcal polysaccharide (PPSV23) vaccine. One dose is recommended after age 13. Talk to your health care provider about which screenings and vaccines you need and how often you need them. This information is not intended to replace advice given to you by your health care provider. Make sure you discuss any questions you have with your health care provider. Document Released: 09/28/2015 Document Revised: 05/21/2016 Document Reviewed: 07/03/2015 Elsevier Interactive Patient Education  2017 [place] Prevention in the Home Falls can cause injuries. They can happen to people of all ages. There are many things you can do to make your home safe and to help prevent falls. What can I do on the outside of my home?  Regularly fix the edges of walkways and driveways and fix any cracks.  Remove anything that might make you trip as you walk through a door, such as a raised step or threshold.  Trim any bushes or trees on the path to your home.  Use bright outdoor lighting.  Clear any walking paths of anything that might make someone trip, such as rocks or tools.  Regularly check to see if handrails are loose or broken. Make sure that both sides of any steps have handrails.  Any raised decks and porches should have guardrails on the edges.  Have any leaves, snow, or ice cleared regularly.  Use sand or salt on walking paths during winter.  Clean up any spills in your garage right away. This includes oil or grease spills. What can I do in the bathroom?  Use night lights.  Install grab bars by the toilet and in the tub and shower. Do not use towel bars as grab bars.  Use non-skid mats or decals in the tub or shower.  If you need to sit down in the shower, use a plastic, non-slip stool.  Keep the floor dry. Clean up any water that spills on the floor as soon as it happens.  Remove  soap buildup in the tub or shower regularly.  Attach bath mats securely with double-sided non-slip rug tape.  Do not have throw rugs and other things on the floor that can make you trip. What can I do in the bedroom?  Use night lights.  Make sure that you have a light by your bed that is easy to reach.  Do not use any sheets or blankets that are too big for your bed. They should not hang down onto the floor.  Have a firm chair that has side arms. You can use this for support while you get dressed.  Do not have throw rugs and other things on the floor that can make you trip. What can I do in the kitchen?  Clean up any spills right away.  Avoid walking on wet floors.  Keep items that you use a lot in easy-to-reach places.  If you need to reach something above you, use a strong step stool that has a grab bar.  Keep electrical cords out of the way.  Do not use floor polish or wax that makes floors slippery. If you must use wax, use non-skid floor wax.  Do not have throw rugs and other  things on the floor that can make you trip. What can I do with my stairs?  Do not leave any items on the stairs.  Make sure that there are handrails on both sides of the stairs and use them. Fix handrails that are broken or loose. Make sure that handrails are as long as the stairways.  Check any carpeting to make sure that it is firmly attached to the stairs. Fix any carpet that is loose or worn.  Avoid having throw rugs at the top or bottom of the stairs. If you do have throw rugs, attach them to the floor with carpet tape.  Make sure that you have a light switch at the top of the stairs and the bottom of the stairs. If you do not have them, ask someone to add them for you. What else can I do to help prevent falls?  Wear shoes that:  Do not have high heels.  Have rubber bottoms.  Are comfortable and fit you well.  Are closed at the toe. Do not wear sandals.  If you use a  stepladder:  Make sure that it is fully opened. Do not climb a closed stepladder.  Make sure that both sides of the stepladder are locked into place.  Ask someone to hold it for you, if possible.  Clearly mark and make sure that you can see:  Any grab bars or handrails.  First and last steps.  Where the edge of each step is.  Use tools that help you move around (mobility aids) if they are needed. These include:  Canes.  Walkers.  Scooters.  Crutches.  Turn on the lights when you go into a dark area. Replace any light bulbs as soon as they burn out.  Set up your furniture so you have a clear path. Avoid moving your furniture around.  If any of your floors are uneven, fix them.  If there are any pets around you, be aware of where they are.  Review your medicines with your doctor. Some medicines can make you feel dizzy. This can increase your chance of falling. Ask your doctor what other things that you can do to help prevent falls. This information is not intended to replace advice given to you by your health care provider. Make sure you discuss any questions you have with your health care provider. Document Released: 06/28/2009 Document Revised: 02/07/2016 Document Reviewed: 10/06/2014 Elsevier Interactive Patient Education  2017 [employer].

## 2020-01-26 ENCOUNTER — Ambulatory Visit (INDEPENDENT_AMBULATORY_CARE_PROVIDER_SITE_OTHER): Payer: Medicare Other | Admitting: Family Medicine

## 2020-01-26 ENCOUNTER — Encounter: Payer: Self-pay | Admitting: Family Medicine

## 2020-01-26 VITALS — BP 119/73 | HR 72 | Temp 97.9°F | Ht 69.69 in | Wt 202.8 lb

## 2020-01-26 DIAGNOSIS — E118 Type 2 diabetes mellitus with unspecified complications: Secondary | ICD-10-CM | POA: Diagnosis not present

## 2020-01-26 DIAGNOSIS — C61 Malignant neoplasm of prostate: Secondary | ICD-10-CM

## 2020-01-26 DIAGNOSIS — E669 Obesity, unspecified: Secondary | ICD-10-CM | POA: Diagnosis not present

## 2020-01-26 DIAGNOSIS — E119 Type 2 diabetes mellitus without complications: Secondary | ICD-10-CM

## 2020-01-26 DIAGNOSIS — I10 Essential (primary) hypertension: Secondary | ICD-10-CM | POA: Diagnosis not present

## 2020-01-26 DIAGNOSIS — T560X1D Toxic effect of lead and its compounds, accidental (unintentional), subsequent encounter: Secondary | ICD-10-CM

## 2020-01-26 DIAGNOSIS — E7800 Pure hypercholesterolemia, unspecified: Secondary | ICD-10-CM

## 2020-01-26 DIAGNOSIS — M1A1790 Lead-induced chronic gout, unspecified ankle and foot, without tophus (tophi): Secondary | ICD-10-CM

## 2020-01-26 DIAGNOSIS — M542 Cervicalgia: Secondary | ICD-10-CM

## 2020-01-26 DIAGNOSIS — E1159 Type 2 diabetes mellitus with other circulatory complications: Secondary | ICD-10-CM

## 2020-01-26 DIAGNOSIS — E1169 Type 2 diabetes mellitus with other specified complication: Secondary | ICD-10-CM | POA: Diagnosis not present

## 2020-01-26 DIAGNOSIS — E785 Hyperlipidemia, unspecified: Secondary | ICD-10-CM | POA: Diagnosis not present

## 2020-01-26 LAB — URINALYSIS, ROUTINE W REFLEX MICROSCOPIC
Bilirubin, UA: NEGATIVE
Ketones, UA: NEGATIVE
Leukocytes,UA: NEGATIVE
Nitrite, UA: NEGATIVE
Protein,UA: NEGATIVE
Specific Gravity, UA: 1.01 (ref 1.005–1.030)
Urobilinogen, Ur: 0.2 mg/dL (ref 0.2–1.0)
pH, UA: 5 (ref 5.0–7.5)

## 2020-01-26 LAB — BAYER DCA HB A1C WAIVED: HB A1C (BAYER DCA - WAIVED): 7 % — ABNORMAL HIGH (ref <7.0)

## 2020-01-26 LAB — MICROSCOPIC EXAMINATION
Bacteria, UA: NONE SEEN
WBC, UA: NONE SEEN /hpf (ref 0–5)

## 2020-01-26 LAB — MICROALBUMIN, URINE WAIVED
Creatinine, Urine Waived: 50 mg/dL (ref 10–300)
Microalb, Ur Waived: 30 mg/L — ABNORMAL HIGH (ref 0–19)

## 2020-01-26 MED ORDER — FARXIGA 10 MG PO TABS: 10.0000 mg | ORAL_TABLET | Freq: Every day | ORAL | 1 refills | Status: DC

## 2020-01-26 MED ORDER — PIOGLITAZONE HCL 45 MG PO TABS: 45.0000 mg | ORAL_TABLET | Freq: Every day | ORAL | 1 refills | Status: DC

## 2020-01-26 MED ORDER — SITAGLIPTIN PHOSPHATE 100 MG PO TABS: 100.0000 mg | ORAL_TABLET | Freq: Every day | ORAL | 1 refills | Status: DC

## 2020-01-26 MED ORDER — ALLOPURINOL 100 MG PO TABS: 100.0000 mg | ORAL_TABLET | Freq: Every day | ORAL | 1 refills | Status: DC

## 2020-01-26 MED ORDER — METFORMIN HCL ER 500 MG PO TB24: 1000.0000 mg | ORAL_TABLET | Freq: Two times a day (BID) | ORAL | 0 refills | Status: DC

## 2020-01-26 MED ORDER — LOVASTATIN 40 MG PO TABS: 40.0000 mg | ORAL_TABLET | Freq: Every day | ORAL | 1 refills | Status: DC

## 2020-01-26 NOTE — Progress Notes (Signed)
BP 119/73 (BP Location: Left Arm, Patient Position: Sitting, Cuff Size: Normal)   Pulse 72   Temp 97.9 F (36.6 C) (Oral)   Ht 5' 9.69" (1.77 m)   Wt 202 lb 12.8 oz (92 kg)   SpO2 99%   BMI 29.36 kg/m    Subjective:    Patient ID: Troy Starr Sr., male    DOB: 1950-12-13, 69 y.o.   MRN: [id number]  HPI: Troy WIESER Sr. is a 69 y.o. male  No chief complaint on file.  Has had 2 eye surgeries and has been doing better with them. He was doing PT, but he had to stop due to the surgeries. He continues with some tightness in his neck and he'd like to get back in to finish his PT  HYPERTENSION / HYPERLIPIDEMIA Satisfied with current treatment? yes Duration of hypertension: chronic BP monitoring frequency: not checking BP medication side effects: no Past BP meds: amlodipine, benazepril Duration of hyperlipidemia: chronic Cholesterol medication side effects: no Cholesterol supplements: none Past cholesterol medications: lovastatin Medication compliance: excellent compliance Aspirin: yes Recent stressors: no Recurrent headaches: no Visual changes: no Palpitations: no Dyspnea: no Chest pain: no Lower extremity edema: no Dizzy/lightheaded: no  DIABETES Hypoglycemic episodes:no Polydipsia/polyuria: no Visual disturbance: yes Chest pain: no Paresthesias: no Glucose Monitoring: yes  Accucheck frequency: Daily  Fasting glucose: 110s-120s Taking Insulin?: no Blood Pressure Monitoring: not checking Retinal Examination: Up to Date Foot Exam: Up to Date Diabetic Education: Completed Pneumovax: Up to Date Influenza: Up to Date Aspirin: yes  Relevant past medical, surgical, family and social history reviewed and updated as indicated. Interim medical history since our last visit reviewed. Allergies and medications reviewed and updated.  Review of Systems  Constitutional: Negative.   HENT: Negative.   Eyes: Positive for visual disturbance. Negative for  photophobia, pain, discharge, redness and itching.  Respiratory: Negative.   Cardiovascular: Negative.   Gastrointestinal: Negative.   Genitourinary: Negative.   Musculoskeletal: Negative.   Neurological: Negative.   Psychiatric/Behavioral: Negative.     Per HPI unless specifically indicated above     Objective:    BP 119/73 (BP Location: Left Arm, Patient Position: Sitting, Cuff Size: Normal)   Pulse 72   Temp 97.9 F (36.6 C) (Oral)   Ht 5' 9.69" (1.77 m)   Wt 202 lb 12.8 oz (92 kg)   SpO2 99%   BMI 29.36 kg/m   Wt Readings from Last 3 Encounters:  01/26/20 202 lb 12.8 oz (92 kg)  01/25/20 202 lb 6.4 oz (91.8 kg)  09/30/19 202 lb 6.4 oz (91.8 kg)    Physical Exam Vitals and nursing note reviewed.  Constitutional:      General: He is not in acute distress.    Appearance: Normal appearance. He is not ill-appearing, toxic-appearing or diaphoretic.  HENT:     Head: Normocephalic and atraumatic.     Right Ear: External ear normal.     Left Ear: External ear normal.     Nose: Nose normal.     Mouth/Throat:     Mouth: Mucous membranes are moist.     Pharynx: Oropharynx is clear.  Eyes:     General: No scleral icterus.       Right eye: No discharge.        Left eye: No discharge.     Extraocular Movements: Extraocular movements intact.     Conjunctiva/sclera: Conjunctivae normal.     Pupils: Pupils are equal, round, and reactive to light.  Cardiovascular:     Rate and Rhythm: Normal rate and regular rhythm.     Pulses: Normal pulses.     Heart sounds: Normal heart sounds. No murmur. No friction rub. No gallop.   Pulmonary:     Effort: Pulmonary effort is normal. No respiratory distress.     Breath sounds: Normal breath sounds. No stridor. No wheezing, rhonchi or rales.  Chest:     Chest wall: No tenderness.  Musculoskeletal:        General: Normal range of motion.     Cervical back: Normal range of motion and neck supple.  Skin:    General: Skin is warm and  dry.     Capillary Refill: Capillary refill takes less than 2 seconds.     Coloration: Skin is not jaundiced or pale.     Findings: No bruising, erythema, lesion or rash.  Neurological:     General: No focal deficit present.     Mental Status: He is alert and oriented to person, place, and time. Mental status is at baseline.  Psychiatric:        Mood and Affect: Mood normal.        Behavior: Behavior normal.        Thought Content: Thought content normal.        Judgment: Judgment normal.     Results for orders placed or performed in visit on 11/23/19  HM DIABETES EYE EXAM  Result Value Ref Range   HM Diabetic Eye Exam No Retinopathy No Retinopathy      Assessment & Plan:   Problem List Items Addressed This Visit      Cardiovascular and Mediastinum   Hypertension    Under good control on current regimen. Continue current regimen. Continue to monitor. Call with any concerns. Refills given. Labs drawn today.       Relevant Medications   lovastatin (MEVACOR) 40 MG tablet   Other Relevant Orders   CBC with Differential/Platelet   Comprehensive metabolic panel   Microalbumin, Urine Waived   TSH   Urinalysis, Routine w reflex microscopic   RESOLVED: Obesity, diabetes, and hypertension syndrome (HCC)    Weight now in the overweight category with BMI of 29.36. Continue to work on diet and exercise. Resolving off problem list.      Relevant Medications   dapagliflozin propanediol (FARXIGA) 10 MG TABS tablet   lovastatin (MEVACOR) 40 MG tablet   pioglitazone (ACTOS) 45 MG tablet   sitaGLIPtin (JANUVIA) 100 MG tablet   metFORMIN (GLUCOPHAGE-XR) 500 MG 24 hr tablet   Other Relevant Orders   CBC with Differential/Platelet   Comprehensive metabolic panel     Endocrine   Controlled diabetes mellitus type 2 with complications ([place]) - Primary    Under good control on current regimen with A1c of 7.0. Continue current regimen. Continue to monitor. Call with any concerns. Refills  given. Labs drawn today.      Relevant Medications   dapagliflozin propanediol (FARXIGA) 10 MG TABS tablet   lovastatin (MEVACOR) 40 MG tablet   pioglitazone (ACTOS) 45 MG tablet   sitaGLIPtin (JANUVIA) 100 MG tablet   metFORMIN (GLUCOPHAGE-XR) 500 MG 24 hr tablet   Other Relevant Orders   CBC with Differential/Platelet   Bayer DCA Hb A1c Waived   Comprehensive metabolic panel   Microalbumin, Urine Waived   Urinalysis, Routine w reflex microscopic     Genitourinary   Prostate cancer (HCC)    Rechecking PSA today. Await results. Continue to follow  with oncology. Call with any concerns.       Relevant Medications   allopurinol (ZYLOPRIM) 100 MG tablet   Other Relevant Orders   PSA     Other   Hyperlipidemia    Under good control on current regimen. Continue current regimen. Continue to monitor. Call with any concerns. Refills given. Labs drawn today.      Relevant Medications   lovastatin (MEVACOR) 40 MG tablet   Other Relevant Orders   CBC with Differential/Platelet   Comprehensive metabolic panel   Lipid Panel w/o Chol/HDL Ratio   Gout    Under good control on current regimen. Continue current regimen. Continue to monitor. Call with any concerns. Refills given. Labs drawn today.      Relevant Medications   allopurinol (ZYLOPRIM) 100 MG tablet   Other Relevant Orders   CBC with Differential/Platelet   Comprehensive metabolic panel    Other Visit Diagnoses    Type 2 diabetes mellitus without complication, without long-term current use of insulin (HCC)       Relevant Medications   dapagliflozin propanediol (FARXIGA) 10 MG TABS tablet   lovastatin (MEVACOR) 40 MG tablet   pioglitazone (ACTOS) 45 MG tablet   sitaGLIPtin (JANUVIA) 100 MG tablet   metFORMIN (GLUCOPHAGE-XR) 500 MG 24 hr tablet   Neck pain       Would like to get back into PT. Referral generated today. Call with any concerns.    Relevant Orders   Ambulatory referral to Physical Therapy        Follow up plan: Return in about 6 months (around 07/28/2020).

## 2020-01-26 NOTE — Assessment & Plan Note (Signed)
Under good control on current regimen. Continue current regimen. Continue to monitor. Call with any concerns. Refills given. Labs drawn today.   

## 2020-01-26 NOTE — Assessment & Plan Note (Addendum)
Under good control on current regimen with A1c of 7.0. Continue current regimen. Continue to monitor. Call with any concerns. Refills given. Labs drawn today.

## 2020-01-26 NOTE — Assessment & Plan Note (Signed)
Rechecking PSA today. Await results. Continue to follow with oncology. Call with any concerns.

## 2020-01-26 NOTE — Assessment & Plan Note (Signed)
Weight now in the overweight category with BMI of 29.36. Continue to work on diet and exercise. Resolving off problem list.

## 2020-01-27 DIAGNOSIS — H33012 Retinal detachment with single break, left eye: Secondary | ICD-10-CM | POA: Diagnosis not present

## 2020-01-27 LAB — CBC WITH DIFFERENTIAL/PLATELET
Basophils Absolute: 0 10*3/uL (ref 0.0–0.2)
Basos: 1 %
EOS (ABSOLUTE): 0.3 10*3/uL (ref 0.0–0.4)
Eos: 6 %
Hematocrit: 40.5 % (ref 37.5–51.0)
Hemoglobin: 13.7 g/dL (ref 13.0–17.7)
Immature Grans (Abs): 0 10*3/uL (ref 0.0–0.1)
Immature Granulocytes: 0 %
Lymphocytes Absolute: 1 10*3/uL (ref 0.7–3.1)
Lymphs: 25 %
MCH: 32.6 pg (ref 26.6–33.0)
MCHC: 33.8 g/dL (ref 31.5–35.7)
MCV: 96 fL (ref 79–97)
Monocytes Absolute: 0.4 10*3/uL (ref 0.1–0.9)
Monocytes: 10 %
Neutrophils Absolute: 2.4 10*3/uL (ref 1.4–7.0)
Neutrophils: 58 %
Platelets: 170 10*3/uL (ref 150–450)
RBC: 4.2 x10E6/uL (ref 4.14–5.80)
RDW: 13.5 % (ref 11.6–15.4)
WBC: 4.1 10*3/uL (ref 3.4–10.8)

## 2020-01-27 LAB — COMPREHENSIVE METABOLIC PANEL
ALT: 26 IU/L (ref 0–44)
AST: 18 IU/L (ref 0–40)
Albumin/Globulin Ratio: 2.2 (ref 1.2–2.2)
Albumin: 4.6 g/dL (ref 3.8–4.8)
Alkaline Phosphatase: 62 IU/L (ref 39–117)
BUN/Creatinine Ratio: 15 (ref 10–24)
BUN: 19 mg/dL (ref 8–27)
Bilirubin Total: 0.3 mg/dL (ref 0.0–1.2)
CO2: 23 mmol/L (ref 20–29)
Calcium: 9.7 mg/dL (ref 8.6–10.2)
Chloride: 98 mmol/L (ref 96–106)
Creatinine, Ser: 1.31 mg/dL — ABNORMAL HIGH (ref 0.76–1.27)
GFR calc Af Amer: 64 mL/min/{1.73_m2} (ref >59)
GFR calc non Af Amer: 56 mL/min/{1.73_m2} — ABNORMAL LOW (ref >59)
Globulin, Total: 2.1 g/dL (ref 1.5–4.5)
Glucose: 145 mg/dL — ABNORMAL HIGH (ref 65–99)
Potassium: 4.6 mmol/L (ref 3.5–5.2)
Sodium: 137 mmol/L (ref 134–144)
Total Protein: 6.7 g/dL (ref 6.0–8.5)

## 2020-01-27 LAB — LIPID PANEL W/O CHOL/HDL RATIO
Cholesterol, Total: 171 mg/dL (ref 100–199)
HDL: 47 mg/dL (ref >39)
LDL Chol Calc (NIH): 76 mg/dL (ref 0–99)
Triglycerides: 300 mg/dL — ABNORMAL HIGH (ref 0–149)
VLDL Cholesterol Cal: 48 mg/dL — ABNORMAL HIGH (ref 5–40)

## 2020-01-27 LAB — TSH: TSH: 1.79 u[IU]/mL (ref 0.450–4.500)

## 2020-01-27 LAB — PSA: Prostate Specific Ag, Serum: 1.4 ng/mL (ref 0.0–4.0)

## 2020-01-31 DIAGNOSIS — M542 Cervicalgia: Secondary | ICD-10-CM | POA: Diagnosis not present

## 2020-02-03 DIAGNOSIS — M542 Cervicalgia: Secondary | ICD-10-CM | POA: Diagnosis not present

## 2020-02-07 DIAGNOSIS — M542 Cervicalgia: Secondary | ICD-10-CM | POA: Diagnosis not present

## 2020-02-09 DIAGNOSIS — M542 Cervicalgia: Secondary | ICD-10-CM | POA: Diagnosis not present

## 2020-02-21 DIAGNOSIS — H31092 Other chorioretinal scars, left eye: Secondary | ICD-10-CM | POA: Diagnosis not present

## 2020-04-19 ENCOUNTER — Other Ambulatory Visit: Payer: Self-pay | Admitting: Family Medicine

## 2020-04-19 DIAGNOSIS — E119 Type 2 diabetes mellitus without complications: Secondary | ICD-10-CM

## 2020-05-02 DIAGNOSIS — H33022 Retinal detachment with multiple breaks, left eye: Secondary | ICD-10-CM | POA: Diagnosis not present

## 2020-05-02 DIAGNOSIS — Z8669 Personal history of other diseases of the nervous system and sense organs: Secondary | ICD-10-CM | POA: Diagnosis not present

## 2020-05-02 DIAGNOSIS — H31092 Other chorioretinal scars, left eye: Secondary | ICD-10-CM | POA: Diagnosis not present

## 2020-06-14 ENCOUNTER — Other Ambulatory Visit: Payer: Self-pay

## 2020-06-14 ENCOUNTER — Inpatient Hospital Stay: Payer: Medicare Other | Attending: Radiation Oncology

## 2020-06-14 DIAGNOSIS — C61 Malignant neoplasm of prostate: Secondary | ICD-10-CM | POA: Diagnosis not present

## 2020-06-14 LAB — PSA: Prostatic Specific Antigen: 1.14 ng/mL (ref 0.00–4.00)

## 2020-06-21 ENCOUNTER — Ambulatory Visit
Admission: RE | Admit: 2020-06-21 | Discharge: 2020-06-21 | Disposition: A | Discharge status: Home / Self Care | Payer: Medicare Other | Source: Ambulatory Visit | Attending: Radiation Oncology | Admitting: Radiation Oncology

## 2020-06-21 ENCOUNTER — Encounter: Payer: Self-pay | Admitting: Radiation Oncology

## 2020-06-21 ENCOUNTER — Other Ambulatory Visit: Payer: Self-pay

## 2020-06-21 ENCOUNTER — Ambulatory Visit (INDEPENDENT_AMBULATORY_CARE_PROVIDER_SITE_OTHER): Payer: Medicare Other

## 2020-06-21 VITALS — HR 57 | Temp 96.1°F | Resp 16 | Wt 204.8 lb

## 2020-06-21 DIAGNOSIS — Z23 Encounter for immunization: Secondary | ICD-10-CM

## 2020-06-21 DIAGNOSIS — C61 Malignant neoplasm of prostate: Secondary | ICD-10-CM | POA: Diagnosis not present

## 2020-06-21 DIAGNOSIS — Z923 Personal history of irradiation: Secondary | ICD-10-CM | POA: Insufficient documentation

## 2020-06-21 DIAGNOSIS — R9721 Rising PSA following treatment for malignant neoplasm of prostate: Secondary | ICD-10-CM | POA: Insufficient documentation

## 2020-06-21 NOTE — Progress Notes (Signed)
Radiation Oncology Follow up Note  Name: Troy Christensen Sr.   Date:   06/21/2020 MRN:  [id number] DOB: [birth year]    This 69 y.o. 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  REFERRING PROVIDER: Guadalupe Maple, MD  HPI: Patient is a 69 year old male now out 5 years having completed IMRT radiation therapy for stage IIb adenocarcinoma the prostate seen today in routine follow-up he is doing well.  He specifically denies any increased lower urinary tract symptoms diarrhea or fatigue.Troy Christensen  His PSA is slowly increasing is from 0.81-year ago to 1.1 most recently.  He is having no bone pain.  COMPLICATIONS OF TREATMENT: none  FOLLOW UP COMPLIANCE: keeps appointments   PHYSICAL EXAM:  Pulse (!) 57   Temp (!) 96.1 F (35.6 C) (Tympanic)   Resp 16   Wt 204 lb 12.8 oz (92.9 kg)   BMI 29.65 kg/m  Well-developed well-nourished patient in NAD. HEENT reveals PERLA, EOMI, discs not visualized.  Oral cavity is clear. No oral mucosal lesions are identified. Neck is clear without evidence of cervical or supraclavicular adenopathy. Lungs are clear to A&P. Cardiac examination is essentially unremarkable with regular rate and rhythm without murmur rub or thrill. Abdomen is benign with no organomegaly or masses noted. Motor sensory and DTR levels are equal and symmetric in the upper and lower extremities. Cranial nerves II through XII are grossly intact. Proprioception is intact. No peripheral adenopathy or edema is identified. No motor or sensory levels are noted. Crude visual fields are within normal range.  RADIOLOGY RESULTS: No current films to review  PLAN: Present time he has a very slowly incremental increase in his PSA 5.3 over a year.  Still not back concerned.  I will see him back in 6 months with a repeat PSA to monitor the slight increase.  We will make further  determinations based on that value at that time.  Patient is comfortable with my continued observation and follow-up.  I would like to take this opportunity to thank you for allowing me to participate in the care of your patient.Noreene Filbert, MD

## 2020-06-22 ENCOUNTER — Other Ambulatory Visit: Payer: Self-pay

## 2020-06-22 ENCOUNTER — Ambulatory Visit
Admission: EM | Admit: 2020-06-22 | Discharge: 2020-06-22 | Disposition: A | Discharge status: Home / Self Care | Payer: Medicare Other

## 2020-06-22 ENCOUNTER — Encounter: Payer: Self-pay | Admitting: Emergency Medicine

## 2020-06-22 DIAGNOSIS — S30811A Abrasion of abdominal wall, initial encounter: Secondary | ICD-10-CM | POA: Diagnosis not present

## 2020-06-22 NOTE — ED Provider Notes (Signed)
[facility]    CSN: [id number] Arrival date & time: 06/22/20  1121      History   Chief Complaint Chief Complaint  Patient presents with  . Abdominal Pain    HPI Troy GOELLER Sr. is a 69 y.o. male.   69 yo male who presents with a red, sore navel x 2 days. He had an umbilical hernia repaired 11 years ago and he wants to be sure that he is not developing a problem from taht surgery. He denies N/V, fever, changes in appetite, or abdominal pain. He just has soreness at the bottom of his navel.      Past Medical History:  Diagnosis Date  . Cancer [facility])    Prostate  . Diabetes mellitus without complication ([place])   . Glaucoma   . Gout   . Hypercholesteremia   . Hypertension   . Osteoarthritis    hands  . Prostate cancer [facility])     Patient Active Problem List   Diagnosis Date Noted  . Controlled diabetes mellitus type 2 with complications ([place]) 09/62/8366  . Advanced care planning/counseling discussion 10/15/2017  . Prostate cancer ([place]) 07/14/2017  . Angiodysplasia of intestine with hemorrhage   . 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 Glaucoma 11/20/2015  . Gout 11/20/2015  . Osteoarthritis 03/21/2015  . 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 Hypertension 03/21/2015  . Hyperlipidemia 03/21/2015    Past Surgical History:  Procedure Laterality Date  . EYE SURGERY Bilateral    march and april 2021- [place]   . FLEXIBLE SIGMOIDOSCOPY N/A 01/10/2016   Procedure: FLEXIBLE SIGMOIDOSCOPY with  argon plasma coagulation;  Surgeon: Lucilla Lame, MD;  Location: [place];  Service: Endoscopy;  Laterality: N/A;  Diabetic - oral meds  . HERNIA REPAIR    . JOINT REPLACEMENT  2014   Right Knee Replacement  . MENISCUS REPAIR Left   . nephrolithiasis     pt denies  . PROSTATE SURGERY     Radiation treatments - no surgery  . ROTATOR CUFF REPAIR Right 2012       Home Medications    Prior to Admission medications   Medication Sig Start  Date End Date Taking? Authorizing Provider  allopurinol (ZYLOPRIM) 100 MG tablet Take 1 tablet (100 mg total) by mouth daily. 01/26/20   Johnson, [name] P, DO  amLODipine-benazepril (LOTREL) 5-20 MG capsule TAKE 2 CAPSULES ONCE EACH DAY 01/13/20   Johnson, [name] P, DO  aspirin EC 81 MG tablet Take 81 mg by mouth daily.    [provider]  dapagliflozin propanediol (FARXIGA) 10 MG TABS tablet Take 10 mg by mouth daily. 01/26/20   Johnson, [name] P, DO  glucose blood test strip 1 each by Other route as needed for other. Use contour next test strip to check sugar 2 times daily 05/20/17   Kathrine Haddock, NP  lovastatin (MEVACOR) 40 MG tablet Take 1 tablet (40 mg total) by mouth daily. 01/26/20   Johnson, [name] P, DO  metFORMIN (GLUCOPHAGE-XR) 500 MG 24 hr tablet TAKE 2 TABLETS TWICE A DAY 04/19/20   Johnson, [name] P, DO  pioglitazone (ACTOS) 45 MG tablet Take 1 tablet (45 mg total) by mouth daily. 01/26/20   Johnson, [name] P, DO  sildenafil (REVATIO) 20 MG tablet Take 1-5 tablets (20-100 mg total) by mouth as needed. 06/20/19   Guadalupe Maple, MD  sitaGLIPtin (JANUVIA) 100 MG tablet Take 1 tablet (100 mg total) by mouth daily. 01/26/20   Park Liter  P, DO    Family History Family History  Problem Relation Age of Onset  . Cancer Father 41       lung  . Emphysema Mother   . Dementia Mother   . Cancer Brother        melanoma    Social History Social History   Tobacco Use  . Smoking status: Former Smoker    Types: Cigarettes    Quit date: 03/26/1975    Years since quitting: 45.2  . Smokeless tobacco: Never Used  Vaping Use  . Vaping Use: Never used  Substance Use Topics  . Alcohol use: No    Alcohol/week: 0.0 standard drinks  . Drug use: No     Allergies   Patient has no known allergies.   Review of Systems Review of Systems  Constitutional: Negative for activity change, appetite change, fatigue and fever.  HENT: Negative for congestion, nosebleeds and sore throat.     Respiratory: Negative for cough and shortness of breath.   Cardiovascular: Negative for chest pain.  Gastrointestinal: Negative for abdominal pain, diarrhea, nausea and vomiting.  Musculoskeletal: Negative for arthralgias and myalgias.  Skin: Negative for rash.  Neurological: Negative for syncope and headaches.  Hematological: Negative.   Psychiatric/Behavioral: Negative.      Physical Exam Triage Vital Signs ED Triage Vitals  Enc Vitals Group     BP 06/22/20 1155 123/71     Pulse Rate 06/22/20 1155 63     Resp 06/22/20 1155 16     Temp 06/22/20 1155 98.2 F (36.8 C)     Temp Source 06/22/20 1155 Oral     SpO2 06/22/20 1155 99 %     Weight 06/22/20 1153 204 lb (92.5 kg)     Height 06/22/20 1153 5\' 10"  (1.778 m)     Head Circumference --      Peak Flow --      Pain Score 06/22/20 1152 0     Pain Loc --      Pain Edu? --      Excl. in [place]? --    No data found.  Updated Vital Signs BP 123/71 (BP Location: Right Arm)   Pulse 63   Temp 98.2 F (36.8 C) (Oral)   Resp 16   Ht 5\' 10"  (1.778 m)   Wt 204 lb (92.5 kg)   SpO2 99%   BMI 29.27 kg/m   Visual Acuity Right Eye Distance:   Left Eye Distance:   Bilateral Distance:    Right Eye Near:   Left Eye Near:    Bilateral Near:     Physical Exam Vitals and nursing note reviewed.  Constitutional:      General: He is not in acute distress.    Appearance: He is well-developed. He is not toxic-appearing.  HENT:     Head: Normocephalic and atraumatic.  Cardiovascular:     Rate and Rhythm: Normal rate and regular rhythm.     Heart sounds: Normal heart sounds. No murmur heard.  No gallop.   Pulmonary:     Effort: Pulmonary effort is normal.     Breath sounds: No wheezing, rhonchi or rales.  Abdominal:     General: Abdomen is protuberant. Bowel sounds are normal. There is no distension.     Palpations: Abdomen is soft. There is no hepatomegaly or mass.     Tenderness: There is no guarding or rebound.     Hernia:  There is no hernia in the umbilical area or  ventral area.     Comments: There are two small scabs at the bottom of the navel. There is no redness or drainage. [place] does not remember getting bitten by anything or finding Ticks.   Skin:    General: Skin is warm and dry.     Capillary Refill: Capillary refill takes less than 2 seconds.     Findings: No erythema or rash.  Neurological:     General: No focal deficit present.     Mental Status: He is alert and oriented to person, place, and time.  Psychiatric:        Mood and Affect: Mood normal.        Behavior: Behavior normal.      UC Treatments / Results  Labs (all labs ordered are listed, but only abnormal results are displayed) Labs Reviewed - No data to display  EKG   Radiology No results found.  Procedures Procedures (including critical care time)  Medications Ordered in UC Medications - No data to display  Initial Impression / Assessment and Plan / UC Course  I have reviewed the triage vital signs and the nursing notes.  Pertinent labs & imaging results that were available during my care of the patient were reviewed by me and considered in my medical decision making (see chart for details).   Patient wants to be evaluated for soreness to his navel that started yesterday. He has no fever, GU symptoms, or N/V. There are two areas that look like scabbed insect bites at the bottom of the navel. There is no surrounding redness, no drainage, and no tenderness. The patient denies seeing or pulling off any Ticks. No HA, joint pain, or rashes.   Will treat for superficial soft tissue injury, recommend Bacitracin BID x 5 days and monitor. Final Clinical Impressions(s) / UC Diagnoses   Final diagnoses:  Abrasion of abdominal wall, initial encounter     Discharge Instructions     Apply Bacitracin twice daily to the sores in your navel.  If you develop fever, or the areas start draining return for re-evaluation.      ED Prescriptions    None     PDMP not reviewed this encounter.   Margarette Canada, NP 06/22/20 1221

## 2020-06-22 NOTE — Discharge Instructions (Addendum)
Apply Bacitracin twice daily to the sores in your navel.  If you develop fever, or the areas start draining return for re-evaluation.

## 2020-06-22 NOTE — ED Triage Notes (Signed)
Patient c/o tenderness and redness on and inside his belly button since yesterday.  Patient had umbilical hernia repair in 2010.  Patient denies fevers.  Patient denies N/V.

## 2020-07-03 DIAGNOSIS — E103212 Type 1 diabetes mellitus with mild nonproliferative diabetic retinopathy with macular edema, left eye: Secondary | ICD-10-CM | POA: Diagnosis not present

## 2020-07-03 DIAGNOSIS — H524 Presbyopia: Secondary | ICD-10-CM | POA: Diagnosis not present

## 2020-07-03 DIAGNOSIS — Z961 Presence of intraocular lens: Secondary | ICD-10-CM | POA: Diagnosis not present

## 2020-07-03 DIAGNOSIS — H401132 Primary open-angle glaucoma, bilateral, moderate stage: Secondary | ICD-10-CM | POA: Diagnosis not present

## 2020-07-03 DIAGNOSIS — E119 Type 2 diabetes mellitus without complications: Secondary | ICD-10-CM | POA: Diagnosis not present

## 2020-07-03 LAB — HM DIABETES EYE EXAM

## 2020-07-31 ENCOUNTER — Ambulatory Visit (INDEPENDENT_AMBULATORY_CARE_PROVIDER_SITE_OTHER): Payer: Medicare Other | Admitting: Family Medicine

## 2020-07-31 ENCOUNTER — Other Ambulatory Visit: Payer: Self-pay

## 2020-07-31 ENCOUNTER — Encounter: Payer: Self-pay | Admitting: Family Medicine

## 2020-07-31 VITALS — BP 132/76 | HR 64 | Temp 98.3°F | Ht 70.5 in | Wt 203.8 lb

## 2020-07-31 DIAGNOSIS — E785 Hyperlipidemia, unspecified: Secondary | ICD-10-CM

## 2020-07-31 DIAGNOSIS — E118 Type 2 diabetes mellitus with unspecified complications: Secondary | ICD-10-CM

## 2020-07-31 DIAGNOSIS — E7800 Pure hypercholesterolemia, unspecified: Secondary | ICD-10-CM | POA: Diagnosis not present

## 2020-07-31 DIAGNOSIS — M1A1790 Lead-induced chronic gout, unspecified ankle and foot, without tophus (tophi): Secondary | ICD-10-CM | POA: Diagnosis not present

## 2020-07-31 DIAGNOSIS — E119 Type 2 diabetes mellitus without complications: Secondary | ICD-10-CM | POA: Diagnosis not present

## 2020-07-31 DIAGNOSIS — I10 Essential (primary) hypertension: Secondary | ICD-10-CM | POA: Diagnosis not present

## 2020-07-31 DIAGNOSIS — T560X1D Toxic effect of lead and its compounds, accidental (unintentional), subsequent encounter: Secondary | ICD-10-CM

## 2020-07-31 LAB — BAYER DCA HB A1C WAIVED: HB A1C (BAYER DCA - WAIVED): 6.9 % (ref <7.0)

## 2020-07-31 MED ORDER — DAPAGLIFLOZIN PROPANEDIOL 10 MG PO TABS
10.0000 mg | ORAL_TABLET | Freq: Every day | ORAL | 1 refills | Status: DC
Start: 2020-07-31 — End: 2021-01-30

## 2020-07-31 MED ORDER — PIOGLITAZONE HCL 45 MG PO TABS: 45.0000 mg | ORAL_TABLET | Freq: Every day | ORAL | 1 refills | Status: DC

## 2020-07-31 MED ORDER — SITAGLIPTIN PHOSPHATE 100 MG PO TABS: 100.0000 mg | ORAL_TABLET | Freq: Every day | ORAL | 1 refills | Status: DC

## 2020-07-31 MED ORDER — AMLODIPINE BESY-BENAZEPRIL HCL 5-20 MG PO CAPS: ORAL_CAPSULE | ORAL | 0 refills | Status: DC

## 2020-07-31 MED ORDER — METFORMIN HCL ER 500 MG PO TB24: 1000.0000 mg | ORAL_TABLET | Freq: Two times a day (BID) | ORAL | 1 refills | Status: DC

## 2020-07-31 MED ORDER — ALLOPURINOL 100 MG PO TABS: 100.0000 mg | ORAL_TABLET | Freq: Every day | ORAL | 1 refills | Status: DC

## 2020-07-31 MED ORDER — LOVASTATIN 40 MG PO TABS: 40.0000 mg | ORAL_TABLET | Freq: Every day | ORAL | 1 refills | Status: DC

## 2020-07-31 NOTE — Assessment & Plan Note (Signed)
Under good control on current regimen. Continue current regimen. Continue to monitor. Call with any concerns. Refills given. Labs drawn today.   

## 2020-07-31 NOTE — Progress Notes (Signed)
BP 132/76   Pulse 64   Temp 98.3 F (36.8 C) (Oral)   Ht 5' 10.5" (1.791 m)   Wt 203 lb 12.8 oz (92.4 kg)   SpO2 100%   BMI 28.83 kg/m    Subjective:    Patient ID: Troy Starr Sr., male    DOB: Dec 21, 1950, 69 y.o.   MRN: [id number]  HPI: Troy CAST Sr. is a 69 y.o. male  Chief Complaint  Patient presents with  . Diabetes  . Hypertension   DIABETES Hypoglycemic episodes:no Polydipsia/polyuria: no Visual disturbance: no Chest pain: no Paresthesias: no Glucose Monitoring: yes  Accucheck frequency: Not Checking Taking Insulin?: no Blood Pressure Monitoring: not checking Retinal Examination: Up to Date Foot Exam: Up to Date Diabetic Education: Completed Pneumovax: Up to Date Influenza: Up to Date Aspirin: yes  HYPERTENSION / HYPERLIPIDEMIA Satisfied with current treatment? yes Duration of hypertension: chronic BP monitoring frequency: not checking BP medication side effects: no Past BP meds: amlodipine, benazepril Duration of hyperlipidemia: chronic Cholesterol medication side effects: no Cholesterol supplements: none Past cholesterol medications: lovastatin Medication compliance: excellent compliance Aspirin: yes Recent stressors: no Recurrent headaches: no Visual changes: no Palpitations: no Dyspnea: no Chest pain: no Lower extremity edema: no Dizzy/lightheaded: no   Relevant past medical, surgical, family and social history reviewed and updated as indicated. Interim medical history since our last visit reviewed. Allergies and medications reviewed and updated.  Review of Systems  Constitutional: Negative.   Respiratory: Negative.   Cardiovascular: Negative.   Gastrointestinal: Negative.   Musculoskeletal: Negative.   Psychiatric/Behavioral: Negative.     Per HPI unless specifically indicated above     Objective:    BP 132/76   Pulse 64   Temp 98.3 F (36.8 C) (Oral)   Ht 5' 10.5" (1.791 m)   Wt 203 lb 12.8 oz (92.4 kg)    SpO2 100%   BMI 28.83 kg/m   Wt Readings from Last 3 Encounters:  07/31/20 203 lb 12.8 oz (92.4 kg)  06/22/20 204 lb (92.5 kg)  06/21/20 204 lb 12.8 oz (92.9 kg)    Physical Exam Vitals and nursing note reviewed.  Constitutional:      General: He is not in acute distress.    Appearance: Normal appearance. He is not ill-appearing, toxic-appearing or diaphoretic.  HENT:     Head: Normocephalic and atraumatic.     Right Ear: External ear normal.     Left Ear: External ear normal.     Nose: Nose normal.     Mouth/Throat:     Mouth: Mucous membranes are moist.     Pharynx: Oropharynx is clear.  Eyes:     General: No scleral icterus.       Right eye: No discharge.        Left eye: No discharge.     Extraocular Movements: Extraocular movements intact.     Conjunctiva/sclera: Conjunctivae normal.     Pupils: Pupils are equal, round, and reactive to light.  Cardiovascular:     Rate and Rhythm: Normal rate and regular rhythm.     Pulses: Normal pulses.     Heart sounds: Normal heart sounds. No murmur heard.  No friction rub. No gallop.   Pulmonary:     Effort: Pulmonary effort is normal. No respiratory distress.     Breath sounds: Normal breath sounds. No stridor. No wheezing, rhonchi or rales.  Chest:     Chest wall: No tenderness.  Musculoskeletal:  General: Normal range of motion.     Cervical back: Normal range of motion and neck supple.  Skin:    General: Skin is warm and dry.     Capillary Refill: Capillary refill takes less than 2 seconds.     Coloration: Skin is not jaundiced or pale.     Findings: No bruising, erythema, lesion or rash.  Neurological:     General: No focal deficit present.     Mental Status: He is alert and oriented to person, place, and time. Mental status is at baseline.  Psychiatric:        Mood and Affect: Mood normal.        Behavior: Behavior normal.        Thought Content: Thought content normal.        Judgment: Judgment normal.      Results for orders placed or performed in visit on 07/31/20  Bayer DCA Hb A1c Waived  Result Value Ref Range   HB A1C (BAYER DCA - WAIVED) 6.9 <7.0 %      Assessment & Plan:   Problem List Items Addressed This Visit      Cardiovascular and Mediastinum   Hypertension    Under good control on current regimen. Continue current regimen. Continue to monitor. Call with any concerns. Refills given. Labs drawn today.       Relevant Medications   lovastatin (MEVACOR) 40 MG tablet   amLODipine-benazepril (LOTREL) 5-20 MG capsule     Endocrine   Controlled diabetes mellitus type 2 with complications ([place]) - Primary    Under good control on current regimen with A1c of 6.9. Continue current regimen. Continue to monitor. Call with any concerns. Refills given. Labs drawn today.       Relevant Medications   sitaGLIPtin (JANUVIA) 100 MG tablet   pioglitazone (ACTOS) 45 MG tablet   metFORMIN (GLUCOPHAGE-XR) 500 MG 24 hr tablet   lovastatin (MEVACOR) 40 MG tablet   dapagliflozin propanediol (FARXIGA) 10 MG TABS tablet   amLODipine-benazepril (LOTREL) 5-20 MG capsule   Other Relevant Orders   Bayer DCA Hb A1c Waived (Completed)   CBC with Differential/Platelet   Comprehensive metabolic panel     Other   Hyperlipidemia    Under good control on current regimen. Continue current regimen. Continue to monitor. Call with any concerns. Refills given. Labs drawn today.       Relevant Medications   lovastatin (MEVACOR) 40 MG tablet   amLODipine-benazepril (LOTREL) 5-20 MG capsule   Other Relevant Orders   CBC with Differential/Platelet   Comprehensive metabolic panel   Lipid Panel w/o Chol/HDL Ratio   Gout    Under good control on current regimen. Continue current regimen. Continue to monitor. Call with any concerns. Refills given. Labs drawn today.       Relevant Medications   allopurinol (ZYLOPRIM) 100 MG tablet   Other Relevant Orders   CBC with Differential/Platelet    Comprehensive metabolic panel   Uric acid    Other Visit Diagnoses    Essential hypertension       Relevant Medications   lovastatin (MEVACOR) 40 MG tablet   amLODipine-benazepril (LOTREL) 5-20 MG capsule   Other Relevant Orders   CBC with Differential/Platelet   Comprehensive metabolic panel   Type 2 diabetes mellitus without complication, without long-term current use of insulin ([facility])       Relevant Medications   sitaGLIPtin (JANUVIA) 100 MG tablet   pioglitazone (ACTOS) 45 MG tablet   metFORMIN (  GLUCOPHAGE-XR) 500 MG 24 hr tablet   lovastatin (MEVACOR) 40 MG tablet   dapagliflozin propanediol (FARXIGA) 10 MG TABS tablet   amLODipine-benazepril (LOTREL) 5-20 MG capsule       Follow up plan: Return in about 6 months (around 01/28/2021).

## 2020-07-31 NOTE — Assessment & Plan Note (Addendum)
Under good control on current regimen with A1c of 6.9. Continue current regimen. Continue to monitor. Call with any concerns. Refills given. Labs drawn today.

## 2020-08-01 LAB — CBC WITH DIFFERENTIAL/PLATELET
Basophils Absolute: 0 10*3/uL (ref 0.0–0.2)
Basos: 1 %
EOS (ABSOLUTE): 0.2 10*3/uL (ref 0.0–0.4)
Eos: 5 %
Hematocrit: 42.6 % (ref 37.5–51.0)
Hemoglobin: 14.2 g/dL (ref 13.0–17.7)
Immature Grans (Abs): 0 10*3/uL (ref 0.0–0.1)
Immature Granulocytes: 1 %
Lymphocytes Absolute: 1 10*3/uL (ref 0.7–3.1)
Lymphs: 24 %
MCH: 32.3 pg (ref 26.6–33.0)
MCHC: 33.3 g/dL (ref 31.5–35.7)
MCV: 97 fL (ref 79–97)
Monocytes Absolute: 0.3 10*3/uL (ref 0.1–0.9)
Monocytes: 8 %
Neutrophils Absolute: 2.6 10*3/uL (ref 1.4–7.0)
Neutrophils: 61 %
Platelets: 164 10*3/uL (ref 150–450)
RBC: 4.4 x10E6/uL (ref 4.14–5.80)
RDW: 13.6 % (ref 11.6–15.4)
WBC: 4.2 10*3/uL (ref 3.4–10.8)

## 2020-08-01 LAB — COMPREHENSIVE METABOLIC PANEL
ALT: 25 IU/L (ref 0–44)
AST: 24 IU/L (ref 0–40)
Albumin/Globulin Ratio: 2.4 — ABNORMAL HIGH (ref 1.2–2.2)
Albumin: 4.8 g/dL (ref 3.8–4.8)
Alkaline Phosphatase: 55 IU/L (ref 44–121)
BUN/Creatinine Ratio: 17 (ref 10–24)
BUN: 19 mg/dL (ref 8–27)
Bilirubin Total: 0.2 mg/dL (ref 0.0–1.2)
CO2: 19 mmol/L — ABNORMAL LOW (ref 20–29)
Calcium: 10.1 mg/dL (ref 8.6–10.2)
Chloride: 101 mmol/L (ref 96–106)
Creatinine, Ser: 1.09 mg/dL (ref 0.76–1.27)
GFR calc Af Amer: 80 mL/min/{1.73_m2} (ref >59)
GFR calc non Af Amer: 69 mL/min/{1.73_m2} (ref >59)
Globulin, Total: 2 g/dL (ref 1.5–4.5)
Glucose: 138 mg/dL — ABNORMAL HIGH (ref 65–99)
Potassium: 4.3 mmol/L (ref 3.5–5.2)
Sodium: 138 mmol/L (ref 134–144)
Total Protein: 6.8 g/dL (ref 6.0–8.5)

## 2020-08-01 LAB — LIPID PANEL W/O CHOL/HDL RATIO
Cholesterol, Total: 203 mg/dL — ABNORMAL HIGH (ref 100–199)
HDL: 53 mg/dL (ref >39)
LDL Chol Calc (NIH): 109 mg/dL — ABNORMAL HIGH (ref 0–99)
Triglycerides: 237 mg/dL — ABNORMAL HIGH (ref 0–149)
VLDL Cholesterol Cal: 41 mg/dL — ABNORMAL HIGH (ref 5–40)

## 2020-08-01 LAB — URIC ACID: Uric Acid: 6.5 mg/dL (ref 3.8–8.4)

## 2020-08-27 ENCOUNTER — Ambulatory Visit (INDEPENDENT_AMBULATORY_CARE_PROVIDER_SITE_OTHER): Payer: Medicare Other | Admitting: Dermatology

## 2020-08-27 ENCOUNTER — Other Ambulatory Visit: Payer: Self-pay

## 2020-08-27 DIAGNOSIS — L821 Other seborrheic keratosis: Secondary | ICD-10-CM

## 2020-08-27 DIAGNOSIS — L820 Inflamed seborrheic keratosis: Secondary | ICD-10-CM

## 2020-08-27 DIAGNOSIS — D229 Melanocytic nevi, unspecified: Secondary | ICD-10-CM | POA: Diagnosis not present

## 2020-08-27 DIAGNOSIS — L570 Actinic keratosis: Secondary | ICD-10-CM

## 2020-08-27 DIAGNOSIS — L814 Other melanin hyperpigmentation: Secondary | ICD-10-CM | POA: Diagnosis not present

## 2020-08-27 DIAGNOSIS — D1800 Hemangioma unspecified site: Secondary | ICD-10-CM | POA: Diagnosis not present

## 2020-08-27 DIAGNOSIS — Z1283 Encounter for screening for malignant neoplasm of skin: Secondary | ICD-10-CM | POA: Diagnosis not present

## 2020-08-27 DIAGNOSIS — L718 Other rosacea: Secondary | ICD-10-CM | POA: Diagnosis not present

## 2020-08-27 DIAGNOSIS — L578 Other skin changes due to chronic exposure to nonionizing radiation: Secondary | ICD-10-CM | POA: Diagnosis not present

## 2020-08-27 DIAGNOSIS — L853 Xerosis cutis: Secondary | ICD-10-CM | POA: Diagnosis not present

## 2020-08-27 NOTE — Progress Notes (Signed)
Follow-Up Visit   Subjective  Troy Christensen. is a 69 y.o. male who presents for the following: Annual Exam. The patient presents for Total-Body Skin Exam (TBSE) for skin cancer screening and mole check.  The following portions of the chart were reviewed this encounter and updated as appropriate:   Tobacco  Allergies  Meds  Problems  Med Hx  Surg Hx  Fam Hx     Review of Systems:  No other skin or systemic complaints except as noted in HPI or Assessment and Plan.  Objective  Well appearing patient in no apparent distress; mood and affect are within normal limits.  A full examination was performed including scalp, head, eyes, ears, nose, lips, neck, chest, axillae, abdomen, back, buttocks, bilateral upper extremities, bilateral lower extremities, hands, feet, fingers, toes, fingernails, and toenails. All findings within normal limits unless otherwise noted below.  Objective  Scalp and face (12): Erythematous thin papules/macules with gritty scale.   Objective  Eyelids:  Erythema of eyelid rims.  Objective  B/L hands (3): Erythematous keratotic or waxy stuck-on papule or plaque.   Assessment & Plan  AK (actinic keratosis) (12) Scalp and face  Destruction of lesion - Scalp and face Complexity: simple   Destruction method: cryotherapy   Informed consent: discussed and consent obtained   Timeout:  patient name, date of birth, surgical site, and procedure verified Lesion destroyed using liquid nitrogen: Yes   Region frozen until ice ball extended beyond lesion: Yes   Outcome: patient tolerated procedure well with no complications   Post-procedure details: wound care instructions given    Ocular rosacea Eyelids Discussed BBL laser treatment Benign appearing, observe.   Inflamed seborrheic keratosis (3) B/L hands  Destruction of lesion - B/L hands Complexity: simple   Destruction method: cryotherapy   Informed consent: discussed and consent obtained    Timeout:  patient name, date of birth, surgical site, and procedure verified Lesion destroyed using liquid nitrogen: Yes   Region frozen until ice ball extended beyond lesion: Yes   Outcome: patient tolerated procedure well with no complications   Post-procedure details: wound care instructions given    Skin cancer screening   Lentigines - Scattered tan macules - Discussed due to sun exposure - Benign, observe - Call for any changes  Seborrheic Keratoses - Stuck-on, waxy, tan-brown papules and plaques  - Discussed benign etiology and prognosis. - Observe - Call for any changes  Melanocytic Nevi - Tan-brown and/or pink-flesh-colored symmetric macules and papules - Benign appearing on exam today - Observation - Call clinic for new or changing moles - Recommend daily use of broad spectrum spf 30+ sunscreen to sun-exposed areas.   Hemangiomas - Red papules - Discussed benign nature - Observe - Call for any changes  Actinic Damage - Chronic, secondary to cumulative UV/sun exposure - diffuse scaly erythematous macules with underlying dyspigmentation - Recommend daily broad spectrum sunscreen SPF 30+ to sun-exposed areas, reapply every 2 hours as needed.  - Call for new or changing lesions.  Severe, Confluent Chronic Actinic Changes with Pre-Cancerous Actinic Keratoses due to cumulative sun exposure/UV radiation exposure over time - Discussed "Field Treatment" Field treatment involves treatment of an entire area of skin that has confluent Actinic Changes (Sun/ Ultraviolet light damage) and PreCancerous Actinic Keratoses by method of PhotoDynamic Therapy (PDT) and/or prescription Topical Chemotherapy agents such as 5-fluorouracil, 5-fluorouracil/calcipotriene, and/or imiquimod.  The purpose is to decrease the number of clinically evident and subclinical PreCancerous lesions to prevent progression to  development of skin cancer by chemically destroying early precancer changes that  may or may not be visible.  It has been shown to reduce the risk of developing skin cancer in the treated area. As a result of treatment, redness, scaling, crusting, and open sores may occur during treatment course. One or more than one of these methods may be used and may have to be used several times to control, suppress and eliminate the PreCancerous changes. Discussed treatment course, expected reaction, and possible side effects. - Plan PDT of the scalp, forehead, and temples.   Xerosis - diffuse xerotic patches - recommend gentle, hydrating skin care - gentle skin care handout given  Skin cancer screening performed today.  Return in about 1 month (around 09/27/2020) for nurse visit - PDT of the scalp, forehead, and temples; Dr.K F/u 61mths.  Luther Redo, CMA, am acting as scribe for Sarina Ser, MD .  Documentation: I have reviewed the above documentation for accuracy and completeness, and I agree with the above.  Sarina Ser, MD

## 2020-09-02 ENCOUNTER — Encounter: Payer: Self-pay | Admitting: Dermatology

## 2020-10-01 ENCOUNTER — Ambulatory Visit: Payer: Medicare Other

## 2020-10-10 ENCOUNTER — Ambulatory Visit (INDEPENDENT_AMBULATORY_CARE_PROVIDER_SITE_OTHER): Payer: Medicare Other

## 2020-10-10 ENCOUNTER — Ambulatory Visit: Payer: Medicare Other

## 2020-10-10 ENCOUNTER — Other Ambulatory Visit: Payer: Self-pay

## 2020-10-10 DIAGNOSIS — L570 Actinic keratosis: Secondary | ICD-10-CM

## 2020-10-10 MED ORDER — AMINOLEVULINIC ACID HCL 20 % EX SOLR
1.0000 "application " | Freq: Once | CUTANEOUS | Status: AC
  Administered 2020-10-10: 354 mg via TOPICAL

## 2020-10-10 NOTE — Progress Notes (Signed)
Patient completed PDT therapy today.  1. AK (actinic keratosis) Scalp, forehead, temples  Photodynamic therapy - Scalp, forehead, temples Procedure discussed: discussed risks, benefits, side effects. and alternatives   Prep: site scrubbed/prepped with acetone   Location:  Scalp, temples and forehead Number of lesions:  Multiple Type of treatment:  Blue light Aminolevulinic Acid (see MAR for details): Levulan Number of Levulan sticks used:  1 Incubation time (minutes):  120 Number of minutes under lamp:  16 Number of seconds under lamp:  40 Cooling:  Floor fan Outcome: patient tolerated procedure well with no complications   Post-procedure details: sunscreen applied    Aminolevulinic Acid HCl 20 % SOLR 354 mg - Scalp, forehead, temples

## 2020-10-11 DIAGNOSIS — H26491 Other secondary cataract, right eye: Secondary | ICD-10-CM | POA: Diagnosis not present

## 2020-10-11 DIAGNOSIS — H31092 Other chorioretinal scars, left eye: Secondary | ICD-10-CM | POA: Diagnosis not present

## 2020-10-11 DIAGNOSIS — Z961 Presence of intraocular lens: Secondary | ICD-10-CM | POA: Diagnosis not present

## 2020-10-11 DIAGNOSIS — E113292 Type 2 diabetes mellitus with mild nonproliferative diabetic retinopathy without macular edema, left eye: Secondary | ICD-10-CM | POA: Diagnosis not present

## 2020-10-11 DIAGNOSIS — H401132 Primary open-angle glaucoma, bilateral, moderate stage: Secondary | ICD-10-CM | POA: Diagnosis not present

## 2020-12-05 DIAGNOSIS — I700 Atherosclerosis of aorta: Secondary | ICD-10-CM | POA: Insufficient documentation

## 2020-12-19 ENCOUNTER — Inpatient Hospital Stay: Payer: Medicare Other | Attending: Radiation Oncology

## 2020-12-19 ENCOUNTER — Other Ambulatory Visit: Payer: Self-pay

## 2020-12-19 DIAGNOSIS — Z7982 Long term (current) use of aspirin: Secondary | ICD-10-CM | POA: Insufficient documentation

## 2020-12-19 DIAGNOSIS — E119 Type 2 diabetes mellitus without complications: Secondary | ICD-10-CM | POA: Insufficient documentation

## 2020-12-19 DIAGNOSIS — E7800 Pure hypercholesterolemia, unspecified: Secondary | ICD-10-CM | POA: Insufficient documentation

## 2020-12-19 DIAGNOSIS — M79602 Pain in left arm: Secondary | ICD-10-CM | POA: Diagnosis not present

## 2020-12-19 DIAGNOSIS — C61 Malignant neoplasm of prostate: Secondary | ICD-10-CM | POA: Diagnosis not present

## 2020-12-19 DIAGNOSIS — Z87891 Personal history of nicotine dependence: Secondary | ICD-10-CM | POA: Insufficient documentation

## 2020-12-19 DIAGNOSIS — M1990 Unspecified osteoarthritis, unspecified site: Secondary | ICD-10-CM | POA: Insufficient documentation

## 2020-12-19 DIAGNOSIS — I10 Essential (primary) hypertension: Secondary | ICD-10-CM | POA: Insufficient documentation

## 2020-12-19 DIAGNOSIS — Z79899 Other long term (current) drug therapy: Secondary | ICD-10-CM | POA: Diagnosis not present

## 2020-12-19 DIAGNOSIS — Z7984 Long term (current) use of oral hypoglycemic drugs: Secondary | ICD-10-CM | POA: Diagnosis not present

## 2020-12-19 LAB — PSA: Prostatic Specific Antigen: 2.03 ng/mL (ref 0.00–4.00)

## 2020-12-26 ENCOUNTER — Ambulatory Visit
Admission: RE | Admit: 2020-12-26 | Discharge: 2020-12-26 | Disposition: A | Discharge status: Home / Self Care | Payer: Medicare Other | Source: Ambulatory Visit | Attending: Radiation Oncology | Admitting: Radiation Oncology

## 2020-12-26 ENCOUNTER — Other Ambulatory Visit: Payer: Self-pay | Admitting: *Deleted

## 2020-12-26 ENCOUNTER — Other Ambulatory Visit: Payer: Self-pay

## 2020-12-26 ENCOUNTER — Encounter: Payer: Self-pay | Admitting: Radiation Oncology

## 2020-12-26 VITALS — BP 126/67 | HR 57 | Temp 96.0°F | Wt 208.9 lb

## 2020-12-26 DIAGNOSIS — C61 Malignant neoplasm of prostate: Secondary | ICD-10-CM | POA: Diagnosis not present

## 2020-12-26 DIAGNOSIS — Z923 Personal history of irradiation: Secondary | ICD-10-CM | POA: Diagnosis not present

## 2020-12-26 NOTE — Progress Notes (Signed)
Radiation Oncology Follow up Note  Name: Troy WIECK Sr.   Date:   12/26/2020 MRN:  [id number] DOB: 1951-01-01    This 70 y.o. 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  REFERRING PROVIDER: Valerie Roys, DO  HPI: Patient is a 70 year old male now out 5 and half years having completed IMRT radiation therapy for stage IIb adenocarcinoma the prostate Gleason 8.  Seen today in routine follow-up he continues to be asymptomatic specifically denies increased lower urinary tract symptoms diarrhea or fatigue.Marland Kitchen  His PSA has slowly been rising from 0.053 years ago to 1.16 months ago now 2.03.  He is having no bone pain.  COMPLICATIONS OF TREATMENT: none  FOLLOW UP COMPLIANCE: keeps appointments   PHYSICAL EXAM:  BP 126/67   Pulse (!) 57   Temp (!) 96 F (35.6 C) (Tympanic)   Wt 208 lb 14.4 oz (94.8 kg)   BMI 29.55 kg/m  Well-developed well-nourished patient in NAD. HEENT reveals PERLA, EOMI, discs not visualized.  Oral cavity is clear. No oral mucosal lesions are identified. Neck is clear without evidence of cervical or supraclavicular adenopathy. Lungs are clear to A&P. Cardiac examination is essentially unremarkable with regular rate and rhythm without murmur rub or thrill. Abdomen is benign with no organomegaly or masses noted. Motor sensory and DTR levels are equal and symmetric in the upper and lower extremities. Cranial nerves II through XII are grossly intact. Proprioception is intact. No peripheral adenopathy or edema is identified. No motor or sensory levels are noted. Crude visual fields are within normal range.  RADIOLOGY RESULTS: F-18 PET CT scan has been ordered  PLAN: At this time I have ordered a F 18 PET CT scan for baseline study and to rule out possibility of bone metastasis or recurrent disease in his prostate.  I am also referring  him to medical oncology for early intervention.  He may benefit again from ADT therapy.  I have asked to see him back in 6 months for follow-up.  We will review his PET findings with him when the become available.  Patient is to call with any concerns.  I would like to take this opportunity to thank you for allowing me to participate in the care of your patient.Noreene Filbert, MD

## 2021-01-02 ENCOUNTER — Inpatient Hospital Stay: Payer: Medicare Other

## 2021-01-02 ENCOUNTER — Inpatient Hospital Stay (HOSPITAL_BASED_OUTPATIENT_CLINIC_OR_DEPARTMENT_OTHER): Payer: Medicare Other | Admitting: Internal Medicine

## 2021-01-02 ENCOUNTER — Encounter: Payer: Self-pay | Admitting: Internal Medicine

## 2021-01-02 ENCOUNTER — Other Ambulatory Visit: Payer: Self-pay

## 2021-01-02 DIAGNOSIS — I10 Essential (primary) hypertension: Secondary | ICD-10-CM | POA: Diagnosis not present

## 2021-01-02 DIAGNOSIS — C61 Malignant neoplasm of prostate: Secondary | ICD-10-CM | POA: Diagnosis not present

## 2021-01-02 DIAGNOSIS — M79602 Pain in left arm: Secondary | ICD-10-CM | POA: Diagnosis not present

## 2021-01-02 DIAGNOSIS — M1990 Unspecified osteoarthritis, unspecified site: Secondary | ICD-10-CM | POA: Diagnosis not present

## 2021-01-02 DIAGNOSIS — E119 Type 2 diabetes mellitus without complications: Secondary | ICD-10-CM | POA: Diagnosis not present

## 2021-01-02 DIAGNOSIS — E7800 Pure hypercholesterolemia, unspecified: Secondary | ICD-10-CM | POA: Diagnosis not present

## 2021-01-02 NOTE — Assessment & Plan Note (Addendum)
#   Prostate cancer [stage II; Gleason score 4+4 -high risk; s/p Lupron x1 2016]-s/p EBRT.  Given the rising PSA most recently April 2022-PSA- 2.1.   Recommend a PET scan for further evaluation.  Awaiting PET scan ordered by radiation oncology on 4/25.   #Discussed with the patient the rising PSA; in the context of his previous high risk prostate cancer-is concerning for biochemical recurrence.  Await PET scan to evaluate for metastatic disease.  #Discussed the treatment options usually include-Lupron/Eligard-antiandrogen therapy.  Discussed pills likely not recommended.  Will discuss at next visit.  # left arm sore- 3 weeks; volataren gel; with movement. Await PET scan.   # DM-2 [FBS-117]-stable.  # DISPOSITION:[phone]-we will collect PET scan/plan.  # follow up TBD- Dr.B  Thank you Dr.Crystal  for allowing me to participate in the care of your pleasant patient. Please do not hesitate to contact me with questions or concerns in the interim.  Cc; Dr.Johnson/Dr. Donella Stade.

## 2021-01-02 NOTE — Progress Notes (Signed)
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   Prostate cancer ([place])  07/14/2017 Initial Diagnosis   Prostate cancer ([place])     HISTORY OF PRESENTING ILLNESS:  Troy Starr Sr. 70 y.o.  male with above history of prostate cancer has been referred to [place] for further evaluation recommendations.  Patient diagnosed with prostate cancer approximately 6 years ago; diagnosed with stage II.  S/p biopsy adenocarcinoma Gleason score 4+4.  Patient underwent radiation.  Patient thinks he might have received 1 Lupron injection at the time of the radiation.  As per the patient there were some concerns for cost also.  Patient has been monitored closely by radiation oncology-PSA checks.  However more recently PSA has been slowly trending up-most recent PSA up to 2.  Patient is currently pending PET scan ordered by radiation oncology.   Patient denies any difficulty with urination.  Denies any difficulty with nausea vomiting abdominal pain or back pain.  He notes to have pain in his left arm-vague.  Worse with movement.  No lumps or masses.  Has been using Voltaren gel with minimal improvement.   Review of Systems  Constitutional: Negative for chills, diaphoresis, fever, malaise/fatigue and weight loss.  HENT: Negative for nosebleeds and sore throat.   Eyes: Negative for double vision.  Respiratory: Negative for cough, hemoptysis, sputum production, shortness of breath and wheezing.   Cardiovascular: Negative for chest pain, palpitations, orthopnea and leg swelling.  Gastrointestinal: Negative for abdominal pain, blood in stool, constipation, diarrhea, heartburn, melena, nausea and vomiting.  Genitourinary:  Negative for dysuria, frequency and urgency.  Musculoskeletal: Positive for joint pain. Negative for back pain.  Skin: Negative.  Negative for itching and rash.  Neurological: Negative for dizziness, tingling, focal weakness, weakness and headaches.  Endo/Heme/Allergies: Does not bruise/bleed easily.  Psychiatric/Behavioral: Negative for depression. The patient is not nervous/anxious and does not have insomnia.      MEDICAL HISTORY:  Past Medical History:  Diagnosis Date  . Cancer [facility])    Prostate  . Diabetes mellitus without complication ([place])   . Glaucoma   . Gout   . Hypercholesteremia   . Hypertension   . Osteoarthritis    hands  . Prostate cancer [facility])     SURGICAL HISTORY: Past Surgical History:  Procedure Laterality Date  . EYE SURGERY Bilateral    march and april 2021- [place]   . FLEXIBLE SIGMOIDOSCOPY N/A 01/10/2016   Procedure: FLEXIBLE SIGMOIDOSCOPY with  argon plasma coagulation;  Surgeon: Lucilla Lame, MD;  Location: [facility];  Service: Endoscopy;  Laterality: N/A;  Diabetic - oral meds  . HERNIA REPAIR    . JOINT REPLACEMENT  2014   Right Knee Replacement  . MENISCUS REPAIR Left   . nephrolithiasis     pt denies  . PROSTATE SURGERY     Radiation treatments - no surgery  . RETINAL DETACHMENT SURGERY Bilateral    One surgery in March 2021, and Second in April 2021  . ROTATOR CUFF REPAIR Right 2012    SOCIAL HISTORY: Social History   Socioeconomic History  . Marital status: Married    Spouse name: Not on file  . Number of children: Not on file  . Years of  education: Not on file  . Highest education level: Not on file  Occupational History  . Occupation: retired  Tobacco Use  . Smoking status: Former Smoker    Types: Cigarettes    Quit date: 03/26/1975    Years since quitting: 45.8  . Smokeless tobacco: Never Used  Vaping Use  . Vaping Use: Never used  Substance and Sexual Activity  . Alcohol use: No    Alcohol/week: 0.0 standard  drinks  . Drug use: No  . Sexual activity: Not on file  Other Topics Concern  . Not on file  Social History Narrative   Lives in [place]; with wife; 5 children. 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 Quit smoking 50 years; no alcohol.    Social Determinants of Health   Financial Resource Strain: Low Risk   . Difficulty of Paying Living Expenses: Not hard at all  Food Insecurity: No Food Insecurity  . Worried About Charity fundraiser in the Last Year: Never true  . Ran Out of Food in the Last Year: Never true  Transportation Needs: No Transportation Needs  . Lack of Transportation (Medical): No  . Lack of Transportation (Non-Medical): No  Physical Activity: Inactive  . Days of Exercise per Week: 0 days  . Minutes of Exercise per Session: 0 min  Stress: Not on file  Social Connections: Moderately Integrated  . Frequency of Communication with Friends and Family: More than three times a week  . Frequency of Social Gatherings with Friends and Family: More than three times a week  . Attends Religious Services: More than 4 times per year  . Active Member of Clubs or Organizations: No  . Attends Archivist Meetings: Never  . Marital Status: Married  Human resources officer Violence: Not on file    FAMILY HISTORY: Family History  Problem Relation Age of Onset  . Cancer Father 15       lung  . Emphysema Mother   . Dementia Mother   . Cancer Brother        melanoma    ALLERGIES:  has No Known Allergies.  MEDICATIONS:  Current Outpatient Medications  Medication Sig Dispense Refill  . allopurinol (ZYLOPRIM) 100 MG tablet Take 1 tablet (100 mg total) by mouth daily. 90 tablet 1  . amLODipine-benazepril (LOTREL) 5-20 MG capsule TAKE 2 CAPSULES ONCE EACH DAY 180 capsule 0  . aspirin EC 81 MG tablet Take 81 mg by mouth daily.    . dapagliflozin propanediol (FARXIGA) 10 MG TABS tablet Take 1 tablet (10 mg total) by mouth daily. 90 tablet 1  . glucose blood test strip  1 each by Other route as needed for other. Use contour next test strip to check sugar 2 times daily 200 each 4  . lovastatin (MEVACOR) 40 MG tablet Take 1 tablet (40 mg total) by mouth daily. 90 tablet 1  . metFORMIN (GLUCOPHAGE-XR) 500 MG 24 hr tablet Take 2 tablets (1,000 mg total) by mouth 2 (two) times daily. 360 tablet 1  . pioglitazone (ACTOS) 45 MG tablet Take 1 tablet (45 mg total) by mouth daily. 90 tablet 1  . sildenafil (REVATIO) 20 MG tablet Take 1-5 tablets (20-100 mg total) by mouth as needed. 50 tablet 12  . sitaGLIPtin (JANUVIA) 100 MG tablet Take 1 tablet (100 mg total) by mouth daily. 90 tablet 1   No current facility-administered medications for this visit.      Marland Kitchen  PHYSICAL EXAMINATION: ECOG PERFORMANCE STATUS: 0 -  Asymptomatic  Vitals:   01/02/21 1128  BP: 118/64  Pulse: (!) 55  Resp: 20  Temp: (!) 97.5 F (36.4 C)   Filed Weights   01/02/21 1128  Weight: 209 lb (94.8 kg)    Physical Exam Constitutional:      Comments: Walking independently.  Accompanied by his wife.  HENT:     Head: Normocephalic and atraumatic.     Mouth/Throat:     Pharynx: No oropharyngeal exudate.  Eyes:     Pupils: Pupils are equal, round, and reactive to light.  Cardiovascular:     Rate and Rhythm: Normal rate and regular rhythm.  Pulmonary:     Effort: Pulmonary effort is normal. No respiratory distress.     Breath sounds: Normal breath sounds. No wheezing.  Abdominal:     General: Bowel sounds are normal. There is no distension.     Palpations: Abdomen is soft. There is no mass.     Tenderness: There is no abdominal tenderness. There is no guarding or rebound.  Musculoskeletal:        General: No tenderness. Normal range of motion.     Cervical back: Normal range of motion and neck supple.  Skin:    General: Skin is warm.  Neurological:     Mental Status: He is alert and oriented to person, place, and time.  Psychiatric:        Mood and Affect: Affect normal.       LABORATORY DATA:  I have reviewed the data as listed Lab Results  Component Value Date   WBC 4.2 07/31/2020   HGB 14.2 07/31/2020   HCT 42.6 07/31/2020   MCV 97 07/31/2020   PLT 164 07/31/2020   Recent Labs    01/26/20 0823 07/31/20 0839  NA 137 138  K 4.6 4.3  CL 98 101  CO2 23 19*  GLUCOSE 145* 138*  BUN 19 19  CREATININE 1.31* 1.09  CALCIUM 9.7 10.1  GFRNONAA 56* 69  GFRAA 64 80  PROT 6.7 6.8  ALBUMIN 4.6 4.8  AST 18 24  ALT 26 25  ALKPHOS 62 55  BILITOT 0.3 0.2    RADIOGRAPHIC STUDIES: I have personally reviewed the radiological images as listed and agreed with the findings in the report. No results found.  Results for Neill, Jurewicz (MRN [id number]) as of 01/02/2021 11:43  Ref. Range 10/24/2015 09:11 05/09/2016 09:18 09/24/2016 09:23 05/21/2017 08:57 10/15/2017 10:55 06/07/2018 08:50 10/26/2018 10:28 06/16/2019 08:58 09/30/2019 09:55 01/26/2020 08:23 06/14/2020 08:55 12/19/2020 09:03  PSA Latest Ref Range: 0.00 - 4.00 ng/mL 0.13 0.04            Prostate Specific Ag, Serum Latest Ref Range: 0.0 - 4.0 ng/mL   <0.1  0.2  0.6  1.2 1.4    Prostatic Specific Antigen Latest Ref Range: 0.00 - 4.00 ng/mL    0.05  0.37  0.84   1.14 2.03    ASSESSMENT & PLAN:   Prostate cancer ([place]) # Prostate cancer [stage II; Gleason score 4+4 -high risk; s/p Lupron x1 2016]-s/p EBRT.  Given the rising PSA most recently April 2022-PSA- 2.1.   Recommend a PET scan for further evaluation.  Awaiting PET scan ordered by radiation oncology on 4/25.   #Discussed with the patient the rising PSA; in the context of his previous high risk prostate cancer-is concerning for biochemical recurrence.  Await PET scan to evaluate for metastatic disease.  #Discussed the treatment options usually include-Lupron/Eligard-antiandrogen therapy.  Discussed pills  likely not recommended.  Will discuss at next visit.  # left arm sore- 3 weeks; volataren gel; with movement. Await PET scan.   # DM-2  [FBS-117]-stable.  # DISPOSITION:[phone]-we will collect PET scan/plan.  # follow up TBD- Dr.B  Thank you Dr.Crystal  for allowing me to participate in the care of your pleasant patient. Please do not hesitate to contact me with questions or concerns in the interim.  Cc; Dr.Johnson/Dr. Donella Stade.    All questions were answered. The patient knows to call the clinic with any problems, questions or concerns.    Cammie Sickle, MD 01/02/2021 12:53 PM

## 2021-01-07 ENCOUNTER — Encounter
Admission: RE | Admit: 2021-01-07 | Discharge: 2021-01-07 | Disposition: A | Discharge status: Home / Self Care | Payer: Medicare Other | Source: Ambulatory Visit | Attending: Radiation Oncology | Admitting: Radiation Oncology

## 2021-01-07 ENCOUNTER — Other Ambulatory Visit: Payer: Self-pay

## 2021-01-07 ENCOUNTER — Other Ambulatory Visit: Payer: Medicare Other

## 2021-01-07 DIAGNOSIS — C61 Malignant neoplasm of prostate: Secondary | ICD-10-CM | POA: Diagnosis present

## 2021-01-07 MED ORDER — PIFLIFOLASTAT F 18 (PYLARIFY) INJECTION
9.0000 | Freq: Once | INTRAVENOUS | Status: AC
  Administered 2021-01-07: 9.295 via INTRAVENOUS

## 2021-01-09 ENCOUNTER — Telehealth: Payer: Self-pay | Admitting: *Deleted

## 2021-01-09 ENCOUNTER — Telehealth: Payer: Self-pay | Admitting: Internal Medicine

## 2021-01-09 DIAGNOSIS — C61 Malignant neoplasm of prostate: Secondary | ICD-10-CM

## 2021-01-09 NOTE — Telephone Encounter (Signed)
Patient called asking that Dr B return his call regarding his PET scan Monday, He states Dr Olena Leatherwood nurse called him yesterday and told him he was being referred back to Dr B and that Dr B would call him. He has seen Dr B, but has no follow up appointment as disposition states follow up tbd. Please advise.  IMPRESSION: 1. Intense radiotracer activity in the midline base of the prostate gland suggestive primary prostate carcinoma. 2. No evidence of metastatic adenopathy within the pelvis or periaortic retroperitoneum. 3. No evidence of visceral metastasis or skeletal metastasis.   Electronically Signed   By: Suzy Bouchard M.D.   On: 01/07/2021 22:00

## 2021-01-09 NOTE — Telephone Encounter (Signed)
On 4/27-spoke to patient regarding the results of the PET scan; my discussion with Dr. Sonny Masters local therapy.  Please Schedule follow-up in 3 months-MD; labs CBC CMP PSA  GB

## 2021-01-09 NOTE — Telephone Encounter (Signed)
Pt.s wife came in and just wanted to [facility] that Namari's Prostate Cancer is back and he is seeing Dr. Donella Stade and she wanted to know if you are getting the notes from the Hospital.

## 2021-01-10 NOTE — Addendum Note (Signed)
Addended by: Gloris Ham on: 01/10/2021 08:53 AM   Modules accepted: Orders

## 2021-01-10 NOTE — Telephone Encounter (Signed)
Colette- please arrange for apts/labs

## 2021-01-10 NOTE — Telephone Encounter (Signed)
See md phone note 

## 2021-01-11 NOTE — Telephone Encounter (Signed)
Iam

## 2021-01-11 NOTE — Telephone Encounter (Signed)
Patient aware.

## 2021-01-14 ENCOUNTER — Encounter: Payer: Self-pay | Admitting: Radiation Oncology

## 2021-01-14 ENCOUNTER — Other Ambulatory Visit: Payer: Self-pay | Admitting: *Deleted

## 2021-01-14 ENCOUNTER — Ambulatory Visit
Admission: RE | Admit: 2021-01-14 | Discharge: 2021-01-14 | Disposition: A | Discharge status: Home / Self Care | Payer: Medicare Other | Source: Ambulatory Visit | Attending: Radiation Oncology | Admitting: Radiation Oncology

## 2021-01-14 ENCOUNTER — Other Ambulatory Visit: Payer: Self-pay

## 2021-01-14 VITALS — Temp 97.4°F | Wt 209.5 lb

## 2021-01-14 DIAGNOSIS — Z923 Personal history of irradiation: Secondary | ICD-10-CM | POA: Insufficient documentation

## 2021-01-14 DIAGNOSIS — C61 Malignant neoplasm of prostate: Secondary | ICD-10-CM | POA: Diagnosis not present

## 2021-01-14 DIAGNOSIS — Z08 Encounter for follow-up examination after completed treatment for malignant neoplasm: Secondary | ICD-10-CM | POA: Diagnosis not present

## 2021-01-14 DIAGNOSIS — R9721 Rising PSA following treatment for malignant neoplasm of prostate: Secondary | ICD-10-CM | POA: Insufficient documentation

## 2021-01-14 NOTE — Progress Notes (Signed)
Radiation Oncology Follow up Note  Name: Troy WILDEN Sr.   Date:   01/14/2021 MRN:  [id number] DOB: [birth year]    This 70 y.o. male presents to the clinic today for follow-up of PET CT scan and patient treated 5 and half years prior for stage IIb Gleason 8 (4+4) adenocarcinoma now with biochemical failure.  REFERRING PROVIDER: Valerie Roys, DO  HPI: Patient is a 70 year old male treated back 5 and half years prior with IMRT radiation therapy for stage IIb Gleason 8 (4+4) adenocarcinoma presenting with a PSA of 6.3.  He had been doing extremely well and continues to be asymptomatic although PSA is beginning to rise from 0.05 a year ago to 2.0 recently.  He has no bone pain.  We had a PET CT scan that only showed hypermetabolic activity in his prostate.  No evidence of bone metastasis or adenopathy.  He continues to be asymptomatic no specific lower urinary tract symptoms diarrhea or fatigue.  Patient has been referred to medical oncology.  COMPLICATIONS OF TREATMENT: none  FOLLOW UP COMPLIANCE: keeps appointments   PHYSICAL EXAM:  Temp (!) 97.4 F (36.3 C) (Tympanic)   Wt 209 lb 8 oz (95 kg)   BMI 29.64 kg/m  Well-developed well-nourished patient in NAD. HEENT reveals PERLA, EOMI, discs not visualized.  Oral cavity is clear. No oral mucosal lesions are identified. Neck is clear without evidence of cervical or supraclavicular adenopathy. Lungs are clear to A&P. Cardiac examination is essentially unremarkable with regular rate and rhythm without murmur rub or thrill. Abdomen is benign with no organomegaly or masses noted. Motor sensory and DTR levels are equal and symmetric in the upper and lower extremities. Cranial nerves II through XII are grossly intact. Proprioception is intact. No peripheral adenopathy or edema is identified. No motor or sensory levels are noted. Crude visual fields are within normal range.  RADIOLOGY RESULTS: PET CT scan reviewed compatible with above-stated  findings  PLAN: At this time I have recommended I-125 interstitial implant for salvage.  We will treat with a boost dose of 115 Gray.  I also like the patient started on a 31-month Eligard.  We are arranging for a volume study and I have referred him to urology to coordinate his implant care.  Risks and benefits of implant including radiation safety precautions increased lower Neri tract symptoms possible diarrhea and fatigue and risks of general anesthesia all were reviewed with the patient and his wife they both seem to comprehend my treatment plan well.  I would like to take this opportunity to thank you for allowing me to participate in the care of your patient.Noreene Filbert, MD

## 2021-01-14 NOTE — Progress Notes (Signed)
01/16/2021  4:40 PM   Troy Starr Sr. 1950/12/16 [id number]  Referring provider: Noreene Filbert, MD [place]   [place]   [place],  [place] [address] Chief Complaint  Patient presents with  . Prostate Cancer    HPI: Troy Orozco. is a 70 y.o. male with a personal history of high-risk prostate cancer stage II, who presents today for further evaluation.  Patient diagnosed with stage II prostate cancer approximately 6 years ago. Status post biopsy adenocarcinoma Gleason score was 4+4 on 01/02/2021. Patient underwent radiation. He thinks he might have received 1 Lupron injection at the time of the radiation. As per the patient there were some concerns for cost also.  Patient has been monitored closely by radiation oncology and consistent PSA checks. However, more recently PSA has been slowly trending up. PSA trend below:  0.05 - 05/21/2017 0.37 - 06/07/2018 0.84 - 06/16/2019 1.14 - 06/14/2020 2.03 - 12/19/2020  He had a recent PET scan (F18-Pylarify) on 01/07/2021 which showed the base of the prostate gland being suggestive of primary prostate carcinoma. There was no evidence of metastatic adenopathy within pelvis or retroperitoneum. No evidence of visceral metastasis or skeletal metastasis.  He was seen and evaluated by Dr. Baruch Gouty and radiation oncology.  He feels the patient would be best served with I-125 interstitial salvage brachytherapy with [place].  He also recommended consideration of 6 months of adjuvant Eligard.  Today he denies any specific urinary symptoms.  No urgency frequency or difficulties voiding.  Former smoker, quit about 46 years ago.  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    PMH: Past Medical History:  Diagnosis Date  . Cancer [facility])    Prostate  . Diabetes mellitus without complication ([place])   . Glaucoma   . Gout   . Hypercholesteremia   . Hypertension   . Osteoarthritis    hands  . Prostate cancer [facility])      Surgical History: Past Surgical History:  Procedure Laterality Date  . EYE SURGERY Bilateral    march and april 2021- [place]   . FLEXIBLE SIGMOIDOSCOPY N/A 01/10/2016   Procedure: FLEXIBLE SIGMOIDOSCOPY with  argon plasma coagulation;  Surgeon: Lucilla Lame, MD;  Location: [facility];  Service: Endoscopy;  Laterality: N/A;  Diabetic - oral meds  . HERNIA REPAIR    . JOINT REPLACEMENT  2014   Right Knee Replacement  . MENISCUS REPAIR Left   . nephrolithiasis     pt denies  . PROSTATE SURGERY     Radiation treatments - no surgery  . RETINAL DETACHMENT SURGERY Bilateral    One surgery in March 2021, and Second in April 2021  . ROTATOR CUFF REPAIR Right 2012    Home Medications:  Allergies as of 01/16/2021   No Known Allergies     Medication List       Accurate as of Jan 16, 2021  4:40 PM. If you have any questions, ask your nurse or doctor.        allopurinol 100 MG tablet Commonly known as: ZYLOPRIM Take 1 tablet (100 mg total) by mouth daily.   amLODipine-benazepril 5-20 MG capsule Commonly known as: LOTREL TAKE 2 CAPSULES ONCE EACH DAY   aspirin EC 81 MG tablet Take 81 mg by mouth daily.   dapagliflozin propanediol 10 MG Tabs tablet Commonly known as: Farxiga Take 1 tablet (10 mg total) by mouth daily.   glucose blood test strip 1 each by Other route as needed  for other. Use contour next test strip to check sugar 2 times daily   lovastatin 40 MG tablet Commonly known as: MEVACOR Take 1 tablet (40 mg total) by mouth daily.   metFORMIN 500 MG 24 hr tablet Commonly known as: GLUCOPHAGE-XR Take 2 tablets (1,000 mg total) by mouth 2 (two) times daily.   pioglitazone 45 MG tablet Commonly known as: ACTOS Take 1 tablet (45 mg total) by mouth daily.   sildenafil 20 MG tablet Commonly known as: REVATIO Take 1-5 tablets (20-100 mg total) by mouth as needed.   sitaGLIPtin 100 MG tablet Commonly known as: Januvia Take 1 tablet (100 mg total) by  mouth daily.       Allergies: No Known Allergies  Family History: Family History  Problem Relation Age of Onset  . Cancer Father 64       lung  . Emphysema Mother   . Dementia Mother   . Cancer Brother        melanoma    Social History:   reports that he quit smoking about 45 years ago. His smoking use included cigarettes. He has never used smokeless tobacco. He reports that he does not drink alcohol and does not use drugs.  ROS: Pertinent ROS in HPI.  Physical Exam: BP 133/86   Pulse 69   Ht 5\' 10"  (1.778 m)   Wt 209 lb (94.8 kg)   BMI 29.99 kg/m   Constitutional:  Alert and oriented, No acute distress.  Accompanied by his wife today. HEENT: [place] AT, moist mucus membranes.  Trachea midline, no masses. Cardiovascular: No clubbing, cyanosis, or edema. Respiratory: Normal respiratory effort, no increased work of breathing. Skin: No rashes, bruises or suspicious lesions. Neurologic: Grossly intact, no focal deficits, moving all 4 extremities. Psychiatric: Normal mood and affect.  Pertinent Imaging:  CLINICAL DATA: Prostate cancer. Initial staging.   EXAM:  NUCLEAR MEDICINE PET SKULL BASE TO THIGH   TECHNIQUE:  9.3 mCi F18 Piflufolastat (Pylarify) was injected intravenously.  Full-ring PET imaging was performed from the skull base to thigh  after the radiotracer. CT data was obtained and used for attenuation  correction and anatomic localization.   COMPARISON: CT abdomen 02/19/2019   FINDINGS:  NECK   No radiotracer activity in neck lymph nodes.   Incidental CT finding: None   CHEST   No radiotracer accumulation within mediastinal or hilar lymph nodes.  No suspicious pulmonary nodules on the CT scan.   Incidental CT finding: None   ABDOMEN/PELVIS   Prostate: Intense radiotracer activity in the midline base of the  prostate gland with SUV max equal 19.3.   Lymph nodes: No abnormal radiotracer accumulation within pelvic or  abdominal nodes.    Liver: No evidence of liver metastasis   Incidental CT finding: Simple cyst of the LEFT kidney   SKELETON   No focal activity to suggest skeletal metastasis.   IMPRESSION:  1. Intense radiotracer activity in the midline base of the prostate  gland suggestive primary prostate carcinoma.  2. No evidence of metastatic adenopathy within the pelvis or  periaortic retroperitoneum.  3. No evidence of visceral metastasis or skeletal metastasis.    Electronically Signed  By: Suzy Bouchard M.D.  On: 01/07/2021 22:00   I have personally reviewed the images and agree with radiologist interpretation.    Assessment & Plan:    1. Prostate cancer [facility]) Focal prostate cancer recurrence  Has elected to undergo salvage brachytherapy seed placement with Dr. Baruch Gouty, has asked for my assistance  In addition to the above, we discussed the role of adjuvant androgen deprivation therapy.  There are some indication that this may helpful in the short-term, 26-month course.  We discussed possible side effects including hot flashes, loss of muscle mass, weight gain, libido issues, amongst others.  All questions were answered.  He is agreeable to pursue ADT and will arrange for nurse visit for Depo injection once we have received a prior authorization.  We further discussed his volume study inosine implantation.  We will likely start him on Flomax after the procedure and monitor his urinary symptoms.  All questions answered. - Urinalysis, Complete   I, Ardyth Gal, am acting as a scribe for Dr. Hollice Espy.   I have reviewed the above documentation for accuracy and completeness, and I agree with the above.   Hollice Espy, MD     [facility] [address] [place], [place] [address] [phone]

## 2021-01-16 ENCOUNTER — Other Ambulatory Visit: Payer: Self-pay

## 2021-01-16 ENCOUNTER — Encounter: Payer: Self-pay | Admitting: Urology

## 2021-01-16 ENCOUNTER — Ambulatory Visit (INDEPENDENT_AMBULATORY_CARE_PROVIDER_SITE_OTHER): Payer: Medicare Other | Admitting: Urology

## 2021-01-16 VITALS — BP 133/86 | HR 69 | Ht 70.0 in | Wt 209.0 lb

## 2021-01-16 DIAGNOSIS — C61 Malignant neoplasm of prostate: Secondary | ICD-10-CM

## 2021-01-16 LAB — MICROSCOPIC EXAMINATION
Bacteria, UA: NONE SEEN
Epithelial Cells (non renal): NONE SEEN /hpf (ref 0–10)
RBC, Urine: NONE SEEN /hpf (ref 0–2)
WBC, UA: NONE SEEN /hpf (ref 0–5)

## 2021-01-16 LAB — URINALYSIS, COMPLETE
Bilirubin, UA: NEGATIVE
Ketones, UA: NEGATIVE
Leukocytes,UA: NEGATIVE
Nitrite, UA: NEGATIVE
Protein,UA: NEGATIVE
RBC, UA: NEGATIVE
Specific Gravity, UA: 1.02 (ref 1.005–1.030)
Urobilinogen, Ur: 0.2 mg/dL (ref 0.2–1.0)
pH, UA: 5 (ref 5.0–7.5)

## 2021-01-17 ENCOUNTER — Telehealth: Payer: Self-pay

## 2021-01-17 NOTE — Telephone Encounter (Signed)
No PA required as pt has red, white, and blue as primary insurance. Tricare as secondary. Pt called and informed. Appt scheduled.

## 2021-01-17 NOTE — Telephone Encounter (Signed)
Benefits investigation faxed.

## 2021-01-17 NOTE — Telephone Encounter (Signed)
-----   Message from Hollice Espy, MD sent at 01/16/2021  4:43 PM EDT ----- 62-month Eligard x1 prior authorization.  Bring him on the nurse schedule once its been approved for Depo.

## 2021-01-22 ENCOUNTER — Ambulatory Visit (INDEPENDENT_AMBULATORY_CARE_PROVIDER_SITE_OTHER): Payer: Medicare Other

## 2021-01-22 ENCOUNTER — Other Ambulatory Visit: Payer: Self-pay

## 2021-01-22 DIAGNOSIS — C61 Malignant neoplasm of prostate: Secondary | ICD-10-CM

## 2021-01-22 MED ORDER — LEUPROLIDE ACETATE (6 MONTH) 45 MG ~~LOC~~ KIT
45.0000 mg | PACK | Freq: Once | SUBCUTANEOUS | Status: AC
  Administered 2021-01-22: 45 mg via SUBCUTANEOUS

## 2021-01-22 NOTE — Progress Notes (Signed)
Eligard SubQ Injection   Due to Prostate Cancer patient is present today for a Eligard Injection.  Medication: Eligard 6 month Dose: 45 mg  Location: right ventrogluteal Lot: [id number] Exp: 05/2022  Patient tolerated well, no complications were noted  Performed by: Bradly Bienenstock, [place]  Per Dr. Erlene Quan patient is to continue therapy for 6 months, 1 time dose.Patient given reminder to continue on Vitamin D 800-1000iu and Calium 1000-1200mg  daily while on Androgen Deprivation Therapy.  PA approval dates: No PA required.

## 2021-01-23 ENCOUNTER — Telehealth: Payer: Self-pay | Admitting: Internal Medicine

## 2021-01-23 ENCOUNTER — Other Ambulatory Visit: Payer: Self-pay | Admitting: Internal Medicine

## 2021-01-23 NOTE — Telephone Encounter (Signed)
Pt awaiting to get ADT/Eligard through Urology. Will HOLD off scheduling eligard with [place].   Follow up as as planned. GB

## 2021-01-28 ENCOUNTER — Encounter: Payer: Medicare Other | Admitting: Family Medicine

## 2021-01-28 DIAGNOSIS — Z961 Presence of intraocular lens: Secondary | ICD-10-CM | POA: Diagnosis not present

## 2021-01-28 DIAGNOSIS — H31092 Other chorioretinal scars, left eye: Secondary | ICD-10-CM | POA: Diagnosis not present

## 2021-01-28 DIAGNOSIS — H401131 Primary open-angle glaucoma, bilateral, mild stage: Secondary | ICD-10-CM | POA: Diagnosis not present

## 2021-01-28 DIAGNOSIS — E113292 Type 2 diabetes mellitus with mild nonproliferative diabetic retinopathy without macular edema, left eye: Secondary | ICD-10-CM | POA: Diagnosis not present

## 2021-01-28 DIAGNOSIS — H35372 Puckering of macula, left eye: Secondary | ICD-10-CM | POA: Diagnosis not present

## 2021-01-28 LAB — HM DIABETES EYE EXAM

## 2021-01-30 ENCOUNTER — Ambulatory Visit (INDEPENDENT_AMBULATORY_CARE_PROVIDER_SITE_OTHER): Payer: Medicare Other | Admitting: Family Medicine

## 2021-01-30 ENCOUNTER — Encounter: Payer: Self-pay | Admitting: Family Medicine

## 2021-01-30 ENCOUNTER — Other Ambulatory Visit: Payer: Self-pay

## 2021-01-30 VITALS — BP 122/66 | HR 66 | Temp 98.1°F | Ht 69.0 in | Wt 207.0 lb

## 2021-01-30 DIAGNOSIS — E119 Type 2 diabetes mellitus without complications: Secondary | ICD-10-CM | POA: Diagnosis not present

## 2021-01-30 DIAGNOSIS — E118 Type 2 diabetes mellitus with unspecified complications: Secondary | ICD-10-CM | POA: Diagnosis not present

## 2021-01-30 DIAGNOSIS — E785 Hyperlipidemia, unspecified: Secondary | ICD-10-CM

## 2021-01-30 DIAGNOSIS — I10 Essential (primary) hypertension: Secondary | ICD-10-CM | POA: Diagnosis not present

## 2021-01-30 DIAGNOSIS — T560X1D Toxic effect of lead and its compounds, accidental (unintentional), subsequent encounter: Secondary | ICD-10-CM | POA: Diagnosis not present

## 2021-01-30 DIAGNOSIS — I700 Atherosclerosis of aorta: Secondary | ICD-10-CM | POA: Diagnosis not present

## 2021-01-30 DIAGNOSIS — M1A1790 Lead-induced chronic gout, unspecified ankle and foot, without tophus (tophi): Secondary | ICD-10-CM

## 2021-01-30 DIAGNOSIS — E7800 Pure hypercholesterolemia, unspecified: Secondary | ICD-10-CM

## 2021-01-30 DIAGNOSIS — C61 Malignant neoplasm of prostate: Secondary | ICD-10-CM | POA: Diagnosis not present

## 2021-01-30 LAB — URINALYSIS, ROUTINE W REFLEX MICROSCOPIC
Bilirubin, UA: NEGATIVE
Ketones, UA: NEGATIVE
Leukocytes,UA: NEGATIVE
Nitrite, UA: NEGATIVE
Protein,UA: NEGATIVE
RBC, UA: NEGATIVE
Specific Gravity, UA: 1.02 (ref 1.005–1.030)
Urobilinogen, Ur: 0.2 mg/dL (ref 0.2–1.0)
pH, UA: 5.5 (ref 5.0–7.5)

## 2021-01-30 LAB — BAYER DCA HB A1C WAIVED: HB A1C (BAYER DCA - WAIVED): 7.3 % — ABNORMAL HIGH (ref <7.0)

## 2021-01-30 LAB — MICROALBUMIN, URINE WAIVED
Creatinine, Urine Waived: 200 mg/dL (ref 10–300)
Microalb, Ur Waived: 30 mg/L — ABNORMAL HIGH (ref 0–19)
Microalb/Creat Ratio: <30 mg/g (ref <30)

## 2021-01-30 MED ORDER — ALLOPURINOL 100 MG PO TABS: 100.0000 mg | ORAL_TABLET | Freq: Every day | ORAL | 1 refills | Status: DC

## 2021-01-30 MED ORDER — DAPAGLIFLOZIN PROPANEDIOL 10 MG PO TABS: 10.0000 mg | ORAL_TABLET | Freq: Every day | ORAL | 1 refills | Status: DC

## 2021-01-30 MED ORDER — AMLODIPINE BESY-BENAZEPRIL HCL 5-20 MG PO CAPS
ORAL_CAPSULE | ORAL | 1 refills | Status: DC
Start: 2021-01-30 — End: 2021-02-12

## 2021-01-30 MED ORDER — PIOGLITAZONE HCL 45 MG PO TABS: 45.0000 mg | ORAL_TABLET | Freq: Every day | ORAL | 1 refills | Status: DC

## 2021-01-30 MED ORDER — SILDENAFIL CITRATE 20 MG PO TABS: 20.0000 mg | ORAL_TABLET | ORAL | 12 refills | Status: DC | PRN

## 2021-01-30 MED ORDER — SITAGLIPTIN PHOSPHATE 100 MG PO TABS: 100.0000 mg | ORAL_TABLET | Freq: Every day | ORAL | 1 refills | Status: DC

## 2021-01-30 MED ORDER — LOVASTATIN 40 MG PO TABS: 40.0000 mg | ORAL_TABLET | Freq: Every day | ORAL | 1 refills | Status: DC

## 2021-01-30 MED ORDER — METFORMIN HCL ER 500 MG PO TB24: 1000.0000 mg | ORAL_TABLET | Freq: Two times a day (BID) | ORAL | 1 refills | Status: DC

## 2021-01-30 NOTE — Patient Instructions (Signed)
Health Maintenance After Age 70 After age 64, you are at a higher risk for certain long-term diseases and infections as well as injuries from falls. Falls are a major cause of broken bones and head injuries in people who are older than age 41. Getting regular preventive care can help to keep you healthy and well. Preventive care includes getting regular testing and making lifestyle changes as recommended by your health care provider. Talk with your health care provider about:  Which screenings and tests you should have. A screening is a test that checks for a disease when you have no symptoms.  A diet and exercise plan that is right for you. What should I know about screenings and tests to prevent falls? Screening and testing are the best ways to find a health problem early. Early diagnosis and treatment give you the best chance of managing medical conditions that are common after age 19. Certain conditions and lifestyle choices may make you more likely to have a fall. Your health care provider may recommend:  Regular vision checks. Poor vision and conditions such as cataracts can make you more likely to have a fall. If you wear glasses, make sure to get your prescription updated if your vision changes.  Medicine review. Work with your health care provider to regularly review all of the medicines you are taking, including over-the-counter medicines. Ask your health care provider about any side effects that may make you more likely to have a fall. Tell your health care provider if any medicines that you take make you feel dizzy or sleepy.  Osteoporosis screening. Osteoporosis is a condition that causes the bones to get weaker. This can make the bones weak and cause them to break more easily.  Blood pressure screening. Blood pressure changes and medicines to control blood pressure can make you feel dizzy.  Strength and balance checks. Your health care provider may recommend certain tests to check your  strength and balance while standing, walking, or changing positions.  Foot health exam. Foot pain and numbness, as well as not wearing proper footwear, can make you more likely to have a fall.  Depression screening. You may be more likely to have a fall if you have a fear of falling, feel emotionally low, or feel unable to do activities that you used to do.  Alcohol use screening. Using too much alcohol can affect your balance and may make you more likely to have a fall. What actions can I take to lower my risk of falls? General instructions  Talk with your health care provider about your risks for falling. Tell your health care provider if: ? You fall. Be sure to tell your health care provider about all falls, even ones that seem minor. ? You feel dizzy, sleepy, or off-balance.  Take over-the-counter and prescription medicines only as told by your health care provider. These include any supplements.  Eat a healthy diet and maintain a healthy weight. A healthy diet includes low-fat dairy products, low-fat (lean) meats, and fiber from whole grains, beans, and lots of fruits and vegetables. Home safety  Remove any tripping hazards, such as rugs, cords, and clutter.  Install safety equipment such as grab bars in bathrooms and safety rails on stairs.  Keep rooms and walkways well-lit. Activity  Follow a regular exercise program to stay fit. This will help you maintain your balance. Ask your health care provider what types of exercise are appropriate for you.  If you need a cane or walker,  use it as recommended by your health care provider.  Wear supportive shoes that have nonskid soles.   Lifestyle  Do not drink alcohol if your health care provider tells you not to drink.  If you drink alcohol, limit how much you have: ? 0-1 drink a day for women. ? 0-2 drinks a day for men.  Be aware of how much alcohol is in your drink. In the [place] one drink equals one typical bottle of beer (12  oz), one-half glass of wine (5 oz), or one shot of hard liquor (1 oz).  Do not use any products that contain nicotine or tobacco, such as cigarettes and e-cigarettes. If you need help quitting, ask your health care provider. Summary  Having a healthy lifestyle and getting preventive care can help to protect your health and wellness after age 34.  Screening and testing are the best way to find a health problem early and help you avoid having a fall. Early diagnosis and treatment give you the best chance for managing medical conditions that are more common for people who are older than age 23.  Falls are a major cause of broken bones and head injuries in people who are older than age 83. Take precautions to prevent a fall at home.  Work with your health care provider to learn what changes you can make to improve your health and wellness and to prevent falls. This information is not intended to replace advice given to you by your health care provider. Make sure you discuss any questions you have with your health care provider. Document Revised: 12/23/2018 Document Reviewed: 07/15/2017 Elsevier Patient Education  2021 [place].  Wrist and Forearm Exercises Ask your health care provider which exercises are safe for you. Do exercises exactly as told by your health care provider and adjust them as directed. It is normal to feel mild stretching, pulling, tightness, or discomfort as you do these exercises. Stop right away if you feel sudden pain or your pain gets worse. Do not begin these exercises until told by your health care provider. Range-of-motion exercises These exercises warm up your muscles and joints and improve the movement and flexibility of your injured wrist and forearm. These exercises also help to relieve pain, numbness, and tingling. These exercises are done using the muscles in your injured wrist and forearm. Wrist flexion 1. Bend your left / right elbow to a 90-degree angle  (right angle) with your palm facing the floor. 2. Bend your wrist so that your fingers point toward the floor (flexion). 3. Hold this position for __________ seconds. 4. Slowly return to the starting position. Repeat __________ times. Complete this exercise __________ times a day. Wrist extension 1. Bend your left / right elbow to a 90-degree angle (right angle) with your palm facing the floor. 2. Bend your wrist so that your fingers point toward the ceiling (extension). 3. Hold this position for __________ seconds. 4. Slowly return to the starting position. Repeat __________ times. Complete this exercise __________ times a day. Ulnar deviation 1. Bend your left / right elbow to a 90-degree angle (right angle), and rest your forearm on a table with your palm facing down. 2. Keeping your hand flat on the table, bend your left /right wrist toward your small finger (pinkie). This is ulnar deviation. 3. Hold this position for __________ seconds. 4. Slowly return to the starting position. Repeat __________ times. Complete this exercise __________ times a day. Radial deviation 1. Bend your left / right  elbow to a 90-degree angle (right angle), and rest your forearm on a table with your palm facing down. 2. Keeping your hand flat on the table, bend your left /right wrist toward your thumb. This is radial deviation. 3. Hold this position for __________ seconds. 4. Slowly return to the starting position. Repeat __________ times. Complete this exercise __________ times a day. Forearm rotation, supination 1. Sit with your left / right elbow bent to a 90-degree angle (right angle). Position your forearm so that the thumb is facing the ceiling (neutral position). 2. Turn (rotate) your palm up toward the ceiling (supination), stopping when you feel a gentle stretch. 3. Hold this position for __________ seconds. 4. Slowly return to the starting position. Repeat __________ times. Complete this exercise  __________ times a day.   Forearm rotation, pronation 1. Sit with your left / right elbow bent to a 90-degree angle (right angle). Position your forearm so that the thumb is facing the ceiling (neutral position). 2. Rotate your palm down toward the floor (pronation), stopping when you feel a gentle stretch. 3. Hold this position for __________ seconds. 4. Slowly return to the starting position. Repeat __________ times. Complete this exercise __________ times a day.   Stretching These exercises warm up your muscles and joints and improve the movement and flexibility of your injured wrist and forearm. These exercises also help to relieve pain, numbness, and tingling. These exercises are done using your healthy wrist and forearm to help stretch the muscles in your injured wrist and forearm. Wrist flexion 1. Extend your left / right arm in front of you, and turn your palm down toward the floor. ? If told by your health care provider, bend your left / right elbow to a 90-degree angle (right angle) at your side. 2. Using your uninjured hand, gently press over the back of your left / right hand to bend your wrist and fingers toward the floor (flexion). Go as far as you can to feel a stretch without causing pain. 3. Hold this position for __________ seconds. 4. Slowly return to the starting position. Repeat __________ times. Complete this exercise __________ times a day.   Wrist extension 1. Extend your left / right arm in front of you and turn your palm up toward the ceiling. ? If told by your health care provider, bend your left / right elbow to a 90-degree angle (right angle) at your side. 2. Using your uninjured hand, gently press over the palm of your left / right hand to bend your wrist and fingers toward the floor (extension). Go as far as you can to feel a stretch without causing pain. 3. Hold this position for __________ seconds. 4. Slowly return to the starting position. Repeat __________  times. Complete this exercise __________ times a day.   Forearm rotation, supination 1. Sit with your left / right elbow bent to a 90-degree angle (right angle). Position your forearm so that the thumb is facing the ceiling (neutral position). 2. Rotate your palm up toward the ceiling as far as you can on your own (supination). Then, use your uninjured hand to help turn your forearm more, stopping when you feel a gentle stretch. 3. Hold this position for __________ seconds. 4. Slowly return to the starting position. Repeat __________ times. Complete this exercise __________ times a day. Forearm rotation, pronation 1. Sit with your left / right elbow bent to a 90-degree angle (right angle). Position your forearm so that the thumb is facing the  ceiling (neutral position). 2. Rotate your palm down toward the floor as far as you can on your own (pronation). Then, use your uninjured hand to help turn your forearm more, stopping when you feel a gentle stretch. 3. Hold this position for __________ seconds. 4. Slowly return to the starting position. Repeat __________ times. Complete this exercise __________ times a day. Strengthening exercises These exercises build strength and endurance in your wrist and forearm. Endurance is the ability to use your muscles for a long time, even after they get tired. Wrist flexion 1. Sit with your left / right forearm supported on a table or other surface. Bend your elbow to a 90-degree angle (right angle), and rest your hand palm-up over the edge of the table. 2. Hold a __________ weight in your left / right hand. Or, hold an exercise band or tube in both hands, keeping your hands at the same level and hip distance apart. There should be a slight tension in the exercise band or tube. 3. Slowly curl your hand up toward the ceiling (flexion). 4. Hold this position for __________ seconds. 5. Slowly lower your hand back to the starting position. Repeat __________ times.  Complete this exercise __________ times a day.   Wrist extension 1. Sit with your left / right forearm supported on a table or other surface. Bend your elbow to a 90-degree angle (right angle), and rest your hand palm-down over the edge of the table. 2. Hold a __________ weight in your left / right hand. Or, hold an exercise band or tube in both hands, keeping your hands at the same level and hip distance apart. There should be a slight tension in the exercise band or tube. 3. Slowly curl your hand up toward the ceiling (extension). 4. Hold this position for __________ seconds. 5. Slowly lower your hand back to the starting position. Repeat __________ times. Complete this exercise __________ times a day.   Forearm rotation, supination 1. Sit with your left / right forearm supported on a table or other surface. Bend your elbow to a 90-degree angle (right angle). Position your forearm so that your thumb is facing the ceiling (neutral position) and your hand is resting over the edge of the table. 2. Hold a hammer in your left / right hand. ? This exercise will be easier if you hold the hammer near the head of the hammer. ? This exercise will be harder if you hold the hammer near the end of the handle. 3. Without moving your elbow, slowly rotate your palm up toward the ceiling (supination). 4. Hold this position for __________ seconds. 5. Slowly return to the starting position. Repeat __________ times. Complete this exercise __________ times a day.   Forearm rotation, pronation 1. Sit with your left / right forearm supported on a table or other surface. Bend your elbow to a 90-degree angle (right angle). Position your forearm so that the thumb is facing the ceiling (neutral position), with your hand resting over the edge of the table. 2. Hold a hammer in your left / right hand. ? This exercise will be easier if you hold the hammer near the head of the hammer. ? This exercise will be harder if you  hold the hammer near the end of the handle. 3. Without moving your elbow, slowly rotate your palm down toward the floor (pronation). 4. Hold this position for __________ seconds. 5. Slowly return to the starting position. Repeat __________ times. Complete this exercise __________ times a  day.   Grip strengthening 1. Grasp a stress ball or other ball in the middle of your left / right hand. Start with your elbow bent to a 90-degree angle (right angle). 2. Slowly increase the pressure, squeezing the ball as hard as you can without causing pain. ? Think of bringing the tips of your fingers into the middle of your palm. All of your finger joints should bend when doing this exercise. ? To make this exercise harder, gradually try to straighten your elbow in front of you, until you can do the exercise with your elbow fully straight. 3. Hold your squeeze for __________ seconds, then relax. If instructed by your health care provider, do this exercise: ? With your forearm positioned so that the thumb is facing the ceiling (neutral position). ? With your forearm turned palm down. ? With your forearm turned palm up. Repeat __________ times. Complete this exercise __________ times a day.   This information is not intended to replace advice given to you by your health care provider. Make sure you discuss any questions you have with your health care provider. Document Revised: 10/21/2018 Document Reviewed: 10/21/2018 [employer] Patient Education  [place].

## 2021-01-30 NOTE — Progress Notes (Signed)
BP 122/66   Pulse 66   Temp 98.1 F (36.7 C)   Ht 5' 9"  (1.753 m)   Wt 207 lb (93.9 kg)   SpO2 96%   BMI 30.57 kg/m    Subjective:    Patient ID: Troy Starr Sr., male    DOB: March 31, 1951, 70 y.o.   MRN: [id number]  HPI: Troy ASEBEDO Sr. is a 70 y.o. male  Chief Complaint  Patient presents with  . Diabetes  . Hypertension  . Hyperlipidemia   HYPERTENSION / HYPERLIPIDEMIA Satisfied with current treatment? yes Duration of hypertension: chronic BP monitoring frequency: not checking BP medication side effects: no Past BP meds: amlodipine, benazepril Duration of hyperlipidemia: chronic Cholesterol medication side effects: no Cholesterol supplements: none Past cholesterol medications: lovastatin Medication compliance: excellent compliance Aspirin: yes Recent stressors: no Recurrent headaches: no Visual changes: no Palpitations: no Dyspnea: no Chest pain: no Lower extremity edema: no Dizzy/lightheaded: no  DIABETES Hypoglycemic episodes:no Polydipsia/polyuria: no Visual disturbance: no Chest pain: no Paresthesias: no Glucose Monitoring: occasionally  Taking Insulin?: no Blood Pressure Monitoring: not checking Retinal Examination: Up to Date Foot Exam: Up to Date Diabetic Education: Completed Pneumovax: Up to Date Influenza: Up to Date Aspirin: yes   No gout flares. Feeling well. Tolerating allopurinol well. No concerns.   Relevant past medical, surgical, family and social history reviewed and updated as indicated. Interim medical history since our last visit reviewed. Allergies and medications reviewed and updated.  Review of Systems  Constitutional: Negative.   HENT: Negative.   Eyes: Negative.   Respiratory: Negative.   Cardiovascular: Negative.   Gastrointestinal: Negative.   Endocrine: Negative.   Genitourinary: Negative.   Musculoskeletal: Negative.   Skin: Negative.   Allergic/Immunologic: Negative.   Neurological: Positive for  numbness. Negative for dizziness, tremors, seizures, syncope, facial asymmetry, speech difficulty, weakness, light-headedness and headaches.  Hematological: Negative.   Psychiatric/Behavioral: Negative.     Per HPI unless specifically indicated above     Objective:    BP 122/66   Pulse 66   Temp 98.1 F (36.7 C)   Ht 5' 9"  (1.753 m)   Wt 207 lb (93.9 kg)   SpO2 96%   BMI 30.57 kg/m   Wt Readings from Last 3 Encounters:  01/30/21 207 lb (93.9 kg)  01/16/21 209 lb (94.8 kg)  01/14/21 209 lb 8 oz (95 kg)    Physical Exam Vitals and nursing note reviewed.  Constitutional:      General: He is not in acute distress.    Appearance: Normal appearance. He is obese. He is not ill-appearing, toxic-appearing or diaphoretic.  HENT:     Head: Normocephalic and atraumatic.     Right Ear: Tympanic membrane, ear canal and external ear normal. There is no impacted cerumen.     Left Ear: Tympanic membrane, ear canal and external ear normal. There is no impacted cerumen.     Nose: Nose normal. No congestion or rhinorrhea.     Mouth/Throat:     Mouth: Mucous membranes are moist.     Pharynx: Oropharynx is clear. No oropharyngeal exudate or posterior oropharyngeal erythema.  Eyes:     General: No scleral icterus.       Right eye: No discharge.        Left eye: No discharge.     Extraocular Movements: Extraocular movements intact.     Conjunctiva/sclera: Conjunctivae normal.     Pupils: Pupils are equal, round, and reactive to light.  Neck:  Vascular: No carotid bruit.  Cardiovascular:     Rate and Rhythm: Normal rate and regular rhythm.     Pulses: Normal pulses.     Heart sounds: No murmur heard. No friction rub. No gallop.   Pulmonary:     Effort: Pulmonary effort is normal. No respiratory distress.     Breath sounds: Normal breath sounds. No stridor. No wheezing, rhonchi or rales.  Chest:     Chest wall: No tenderness.  Abdominal:     General: Abdomen is flat. Bowel sounds  are normal. There is no distension.     Palpations: Abdomen is soft. There is no mass.     Tenderness: There is no abdominal tenderness. There is no right CVA tenderness, left CVA tenderness, guarding or rebound.     Hernia: No hernia is present.  Genitourinary:    Comments: Genital exam deferred with shared decision making Musculoskeletal:        General: No swelling, tenderness, deformity or signs of injury.     Cervical back: Normal range of motion and neck supple. No rigidity. No muscular tenderness.     Right lower leg: No edema.     Left lower leg: No edema.  Lymphadenopathy:     Cervical: No cervical adenopathy.  Skin:    General: Skin is warm and dry.     Capillary Refill: Capillary refill takes less than 2 seconds.     Coloration: Skin is not jaundiced or pale.     Findings: No bruising, erythema, lesion or rash.  Neurological:     General: No focal deficit present.     Mental Status: He is alert and oriented to person, place, and time.     Cranial Nerves: No cranial nerve deficit.     Sensory: No sensory deficit.     Motor: No weakness.     Coordination: Coordination normal.     Gait: Gait normal.     Deep Tendon Reflexes: Reflexes normal.  Psychiatric:        Mood and Affect: Mood normal.        Behavior: Behavior normal.        Thought Content: Thought content normal.        Judgment: Judgment normal.     Results for orders placed or performed in visit on 01/30/21  Bayer DCA Hb A1c Waived  Result Value Ref Range   HB A1C (BAYER DCA - WAIVED) 7.3 (H) <7.0 %  CBC with Differential/Platelet  Result Value Ref Range   WBC 3.8 3.4 - 10.8 x10E3/uL   RBC 4.14 4.14 - 5.80 x10E6/uL   Hemoglobin 13.5 13.0 - 17.7 g/dL   Hematocrit 39.9 37.5 - 51.0 %   MCV 96 79 - 97 fL   MCH 32.6 26.6 - 33.0 pg   MCHC 33.8 31.5 - 35.7 g/dL   RDW 13.5 11.6 - 15.4 %   Platelets 175 150 - 450 x10E3/uL   Neutrophils 55 Not Estab. %   Lymphs 27 Not Estab. %   Monocytes 10 Not Estab. %    Eos 7 Not Estab. %   Basos 1 Not Estab. %   Neutrophils Absolute 2.1 1.4 - 7.0 x10E3/uL   Lymphocytes Absolute 1.0 0.7 - 3.1 x10E3/uL   Monocytes Absolute 0.4 0.1 - 0.9 x10E3/uL   EOS (ABSOLUTE) 0.3 0.0 - 0.4 x10E3/uL   Basophils Absolute 0.0 0.0 - 0.2 x10E3/uL   Immature Granulocytes 0 Not Estab. %   Immature Grans (Abs) 0.0 0.0 -  0.1 x10E3/uL  Comprehensive metabolic panel  Result Value Ref Range   Glucose 119 (H) 65 - 99 mg/dL   BUN 20 8 - 27 mg/dL   Creatinine, Ser 1.20 0.76 - 1.27 mg/dL   eGFR 65 >59 mL/min/1.73   BUN/Creatinine Ratio 17 10 - 24   Sodium 139 134 - 144 mmol/L   Potassium 4.4 3.5 - 5.2 mmol/L   Chloride 102 96 - 106 mmol/L   CO2 18 (L) 20 - 29 mmol/L   Calcium 9.5 8.6 - 10.2 mg/dL   Total Protein 6.5 6.0 - 8.5 g/dL   Albumin 4.7 3.8 - 4.8 g/dL   Globulin, Total 1.8 1.5 - 4.5 g/dL   Albumin/Globulin Ratio 2.6 (H) 1.2 - 2.2   Bilirubin Total 0.3 0.0 - 1.2 mg/dL   Alkaline Phosphatase 51 44 - 121 IU/L   AST 21 0 - 40 IU/L   ALT 24 0 - 44 IU/L  Lipid Panel w/o Chol/HDL Ratio  Result Value Ref Range   Cholesterol, Total 163 100 - 199 mg/dL   Triglycerides 204 (H) 0 - 149 mg/dL   HDL 53 >39 mg/dL   VLDL Cholesterol Cal 34 5 - 40 mg/dL   LDL Chol Calc (NIH) 76 0 - 99 mg/dL  Microalbumin, Urine Waived  Result Value Ref Range   Microalb, Ur Waived 30 (H) 0 - 19 mg/L   Creatinine, Urine Waived 200 10 - 300 mg/dL   Microalb/Creat Ratio <30 <30 mg/g  TSH  Result Value Ref Range   TSH 1.460 0.450 - 4.500 uIU/mL  Urinalysis, Routine w reflex microscopic  Result Value Ref Range   Specific Gravity, UA 1.020 1.005 - 1.030   pH, UA 5.5 5.0 - 7.5   Color, UA Yellow Yellow   Appearance Ur Clear Clear   Leukocytes,UA Negative Negative   Protein,UA Negative Negative/Trace   Glucose, UA 3+ (A) Negative   Ketones, UA Negative Negative   RBC, UA Negative Negative   Bilirubin, UA Negative Negative   Urobilinogen, Ur 0.2 0.2 - 1.0 mg/dL   Nitrite, UA Negative  Negative  Uric acid  Result Value Ref Range   Uric Acid 5.9 3.8 - 8.4 mg/dL      Assessment & Plan:   Problem List Items Addressed This Visit      Cardiovascular and Mediastinum   Hypertension - Primary    Under good control on current regimen. Continue current regimen. Continue to monitor. Call with any concerns. Refills given. Labs drawn today.        Relevant Medications   lovastatin (MEVACOR) 40 MG tablet   amLODipine-benazepril (LOTREL) 5-20 MG capsule   sildenafil (REVATIO) 20 MG tablet   Other Relevant Orders   CBC with Differential/Platelet (Completed)   Comprehensive metabolic panel (Completed)   Microalbumin, Urine Waived (Completed)   TSH (Completed)   Aortic atherosclerosis (HCC)    Keep BP, sugars and cholesterol under good control. Continue to monitor. Call with any concerns.       Relevant Medications   lovastatin (MEVACOR) 40 MG tablet   amLODipine-benazepril (LOTREL) 5-20 MG capsule   sildenafil (REVATIO) 20 MG tablet   Other Relevant Orders   CBC with Differential/Platelet (Completed)   Comprehensive metabolic panel (Completed)     Endocrine   Controlled diabetes mellitus type 2 with complications ([place])    Up slightly with A1c of 7.3- really work on diet and exercise and recheck 3 months. Call with any concerns. Continue to monitor.  Relevant Medications   sitaGLIPtin (JANUVIA) 100 MG tablet   pioglitazone (ACTOS) 45 MG tablet   metFORMIN (GLUCOPHAGE-XR) 500 MG 24 hr tablet   lovastatin (MEVACOR) 40 MG tablet   dapagliflozin propanediol (FARXIGA) 10 MG TABS tablet   amLODipine-benazepril (LOTREL) 5-20 MG capsule   Other Relevant Orders   Bayer DCA Hb A1c Waived (Completed)   CBC with Differential/Platelet (Completed)   Comprehensive metabolic panel (Completed)   Microalbumin, Urine Waived (Completed)   Urinalysis, Routine w reflex microscopic (Completed)     Genitourinary   Prostate cancer [facility])    Following with urology and oncology.  Continue to monitor. Call with any concerns.       Relevant Medications   allopurinol (ZYLOPRIM) 100 MG tablet   Other Relevant Orders   CBC with Differential/Platelet (Completed)   Comprehensive metabolic panel (Completed)   Urinalysis, Routine w reflex microscopic (Completed)     Other   Hyperlipidemia    Under good control on current regimen. Continue current regimen. Continue to monitor. Call with any concerns. Refills given. Labs drawn today.        Relevant Medications   lovastatin (MEVACOR) 40 MG tablet   amLODipine-benazepril (LOTREL) 5-20 MG capsule   sildenafil (REVATIO) 20 MG tablet   Other Relevant Orders   CBC with Differential/Platelet (Completed)   Comprehensive metabolic panel (Completed)   Lipid Panel w/o Chol/HDL Ratio (Completed)   Gout    Under good control on current regimen. Continue current regimen. Continue to monitor. Call with any concerns. Refills given. Labs drawn today.        Relevant Medications   allopurinol (ZYLOPRIM) 100 MG tablet   Other Relevant Orders   CBC with Differential/Platelet (Completed)   Comprehensive metabolic panel (Completed)   Uric acid (Completed)    Other Visit Diagnoses    Type 2 diabetes mellitus without complication, without long-term current use of insulin (HCC)       Relevant Medications   sitaGLIPtin (JANUVIA) 100 MG tablet   pioglitazone (ACTOS) 45 MG tablet   metFORMIN (GLUCOPHAGE-XR) 500 MG 24 hr tablet   lovastatin (MEVACOR) 40 MG tablet   dapagliflozin propanediol (FARXIGA) 10 MG TABS tablet   amLODipine-benazepril (LOTREL) 5-20 MG capsule   Essential hypertension       Relevant Medications   lovastatin (MEVACOR) 40 MG tablet   amLODipine-benazepril (LOTREL) 5-20 MG capsule   sildenafil (REVATIO) 20 MG tablet       Follow up plan: Return in about 3 months (around 05/02/2021) for DM.

## 2021-01-31 LAB — LIPID PANEL W/O CHOL/HDL RATIO
Cholesterol, Total: 163 mg/dL (ref 100–199)
HDL: 53 mg/dL (ref >39)
LDL Chol Calc (NIH): 76 mg/dL (ref 0–99)
Triglycerides: 204 mg/dL — ABNORMAL HIGH (ref 0–149)
VLDL Cholesterol Cal: 34 mg/dL (ref 5–40)

## 2021-01-31 LAB — CBC WITH DIFFERENTIAL/PLATELET
Basophils Absolute: 0 x10E3/uL (ref 0.0–0.2)
Basos: 1 %
EOS (ABSOLUTE): 0.3 x10E3/uL (ref 0.0–0.4)
Eos: 7 %
Hematocrit: 39.9 % (ref 37.5–51.0)
Hemoglobin: 13.5 g/dL (ref 13.0–17.7)
Immature Grans (Abs): 0 x10E3/uL (ref 0.0–0.1)
Immature Granulocytes: 0 %
Lymphocytes Absolute: 1 x10E3/uL (ref 0.7–3.1)
Lymphs: 27 %
MCH: 32.6 pg (ref 26.6–33.0)
MCHC: 33.8 g/dL (ref 31.5–35.7)
MCV: 96 fL (ref 79–97)
Monocytes Absolute: 0.4 x10E3/uL (ref 0.1–0.9)
Monocytes: 10 %
Neutrophils Absolute: 2.1 x10E3/uL (ref 1.4–7.0)
Neutrophils: 55 %
Platelets: 175 x10E3/uL (ref 150–450)
RBC: 4.14 x10E6/uL (ref 4.14–5.80)
RDW: 13.5 % (ref 11.6–15.4)
WBC: 3.8 x10E3/uL (ref 3.4–10.8)

## 2021-01-31 LAB — COMPREHENSIVE METABOLIC PANEL
ALT: 24 IU/L (ref 0–44)
AST: 21 IU/L (ref 0–40)
Albumin/Globulin Ratio: 2.6 — ABNORMAL HIGH (ref 1.2–2.2)
Albumin: 4.7 g/dL (ref 3.8–4.8)
Alkaline Phosphatase: 51 IU/L (ref 44–121)
BUN/Creatinine Ratio: 17 (ref 10–24)
BUN: 20 mg/dL (ref 8–27)
Bilirubin Total: 0.3 mg/dL (ref 0.0–1.2)
CO2: 18 mmol/L — ABNORMAL LOW (ref 20–29)
Calcium: 9.5 mg/dL (ref 8.6–10.2)
Chloride: 102 mmol/L (ref 96–106)
Creatinine, Ser: 1.2 mg/dL (ref 0.76–1.27)
Globulin, Total: 1.8 g/dL (ref 1.5–4.5)
Glucose: 119 mg/dL — ABNORMAL HIGH (ref 65–99)
Potassium: 4.4 mmol/L (ref 3.5–5.2)
Sodium: 139 mmol/L (ref 134–144)
Total Protein: 6.5 g/dL (ref 6.0–8.5)
eGFR: 65 mL/min/{1.73_m2} (ref >59)

## 2021-01-31 LAB — TSH: TSH: 1.46 u[IU]/mL (ref 0.450–4.500)

## 2021-01-31 LAB — URIC ACID: Uric Acid: 5.9 mg/dL (ref 3.8–8.4)

## 2021-01-31 NOTE — Assessment & Plan Note (Signed)
Under good control on current regimen. Continue current regimen. Continue to monitor. Call with any concerns. Refills given. Labs drawn today.   

## 2021-01-31 NOTE — Assessment & Plan Note (Signed)
Following with urology and oncology. Continue to monitor. Call with any concerns.

## 2021-01-31 NOTE — Assessment & Plan Note (Signed)
Up slightly with A1c of 7.3- really work on diet and exercise and recheck 3 months. Call with any concerns. Continue to monitor.

## 2021-01-31 NOTE — Assessment & Plan Note (Signed)
Keep BP, sugars and cholesterol under good control. Continue to monitor. Call with any concerns.

## 2021-02-12 ENCOUNTER — Telehealth: Payer: Self-pay

## 2021-02-12 DIAGNOSIS — I10 Essential (primary) hypertension: Secondary | ICD-10-CM

## 2021-02-12 MED ORDER — AMLODIPINE BESY-BENAZEPRIL HCL 5-20 MG PO CAPS: ORAL_CAPSULE | ORAL | 1 refills | Status: DC

## 2021-02-12 NOTE — Telephone Encounter (Signed)
Pt presented in office stating that he is completely out of his amlodipine and hasn't received mail order yet. [facility]

## 2021-02-12 NOTE — Telephone Encounter (Signed)
[facility] 

## 2021-02-27 ENCOUNTER — Ambulatory Visit: Admission: RE | Admit: 2021-02-27 | Payer: Medicare Other | Source: Home / Self Care

## 2021-02-27 ENCOUNTER — Encounter: Payer: Self-pay | Admitting: Radiation Oncology

## 2021-02-27 ENCOUNTER — Ambulatory Visit
Admission: RE | Admit: 2021-02-27 | Discharge: 2021-02-27 | Disposition: A | Discharge status: Home / Self Care | Payer: Medicare Other | Source: Ambulatory Visit | Attending: Radiation Oncology | Admitting: Radiation Oncology

## 2021-02-27 DIAGNOSIS — C61 Malignant neoplasm of prostate: Secondary | ICD-10-CM | POA: Diagnosis not present

## 2021-02-27 SURGERY — ULTRASOUND, PROSTATE, FOR VOLUME DETERMINATION
Anesthesia: Choice

## 2021-02-27 NOTE — Progress Notes (Signed)
Radiation Oncology Follow up Note  Name: Troy Christensen   Date:   02/27/2021 MRN:  [id number] DOB: 18-Nov-1950    This 70 y.o. male presents to the OR today for volume study in anticipation of I-125 interstitial implant for salvage treatment for adenocarcinoma the prostate REFERRING PROVIDER: Valerie Roys, DO  HPI: Patient is a prior patient of mine 70 years old status post image guided IMRT radiation therapy close to 6 years prior for stage IIb Gleason 8 (4+4) adenocarcinoma who presented with biochemical failure.  PSMA PET scan showed only hypermetabolic to be in his prostate with no evidence of bony metastatic metastatic disease or adenopathy.  He is asymptomatic.  We opted to go ahead with salvage I-125 interstitial implant with a boost dose.  Seen today for his volume study anticipation of implant and is doing well.  He states he is having very little lower urinary tract symptoms or abdominal complaints..  COMPLICATIONS OF TREATMENT: none  FOLLOW UP COMPLIANCE: keeps appointments   PHYSICAL EXAM:  There were no vitals taken for this visit. Well-developed well-nourished patient in NAD. HEENT reveals PERLA, EOMI, discs not visualized.  Oral cavity is clear. No oral mucosal lesions are identified. Neck is clear without evidence of cervical or supraclavicular adenopathy. Lungs are clear to A&P. Cardiac examination is essentially unremarkable with regular rate and rhythm without murmur rub or thrill. Abdomen is benign with no organomegaly or masses noted. Motor sensory and DTR levels are equal and symmetric in the upper and lower extremities. Cranial nerves II through XII are grossly intact. Proprioception is intact. No peripheral adenopathy or edema is identified. No motor or sensory levels are noted. Crude visual fields are within normal range.  RADIOLOGY RESULTS: Ultrasound used for volume study  PLAN: Patient was taken to the cystoscopy suite in the OR. Patient was placed in the low  lithotomy position. Foley catheter was placed. Trans-rectal ultrasound probe was inserted into the rectum and prostate seminal vesicles were visualized as well as bladder base. stepping images were performed on a 5 mm increments. Images will be placed in [facility] treatment planning system to determine seed placement coordinates for eventual I-125 interstitial implant. Images will be reviewed with the physics and dosimetry staff for final quality approval. I personally was present for the volume study and assisted in delineation of contour volumes.  At the end of the procedure Foley catheter was removed, rectal ultrasound probe was removed. Patient tolerated his procedures extremely well with no side effects or complaints. Patient has given appointment for interstitial implant date. Consent was signed today as well as history and physical performed in preparation for his outpatient surgical implant.     Noreene Filbert, MD

## 2021-02-27 NOTE — H&P (Signed)
NEW PATIENT EVALUATION  Name: Troy Christensen  MRN: [id number]  Date:   02/27/2021     DOB: April 23, 1951   This 70 y.o. male patient presents to the clinic for history and physical in preparation of I-125 interstitial implant for salvage and patient previously treated over 5 years 1/2 years prior with IMRT radiation therapy for stage IIb Gleason 8 (4+4) adenocarcinoma presenting with a PSA of 6.3 REFERRING PHYSICIAN: Valerie Roys, DO  CHIEF COMPLAINT:  Chief Complaint  Patient presents with   Prostate Cancer    Post volume study    DIAGNOSIS: The encounter diagnosis was Malignant neoplasm of prostate ([place]).   PREVIOUS INVESTIGATIONS:  PET CT scans reviewed Clinical notes reviewed Pathology reviewed  HPI: Patient is a prior patient of mine 69 years old status post image guided IMRT radiation therapy close to 6 years prior for stage IIb Gleason 8 (4+4) adenocarcinoma who presented with biochemical failure.  PSMA PET scan showed only hypermetabolic to be in his prostate with no evidence of bony metastatic metastatic disease or adenopathy.  He is asymptomatic.  We opted to go ahead with salvage I-125 interstitial implant with a boost dose.  Seen today for his volume study anticipation of implant and is doing well.  He states he is having very little lower urinary tract symptoms or abdominal complaints.  PLANNED TREATMENT REGIMEN: I-125 interstitial implant for boost  PAST MEDICAL HISTORY:  has a past medical history of Cancer ([place]), Diabetes mellitus without complication ([place]), Glaucoma, Gout, Hypercholesteremia, Hypertension, Osteoarthritis, and Prostate cancer ([place]).    PAST SURGICAL HISTORY:  Past Surgical History:  Procedure Laterality Date   EYE SURGERY Bilateral    march and april 2021- [facility]    FLEXIBLE SIGMOIDOSCOPY N/A 01/10/2016   Procedure: FLEXIBLE SIGMOIDOSCOPY with  argon plasma coagulation;  Surgeon: Lucilla Lame, MD;  Location: [facility];  Service:  Endoscopy;  Laterality: N/A;  Diabetic - oral meds   HERNIA REPAIR     JOINT REPLACEMENT  2014   Right Knee Replacement   MENISCUS REPAIR Left    nephrolithiasis     pt denies   PROSTATE SURGERY     Radiation treatments - no surgery   RETINAL DETACHMENT SURGERY Bilateral    One surgery in March 2021, and Second in April 2021   ROTATOR CUFF REPAIR Right 2012    FAMILY HISTORY: family history includes Cancer in his brother; Cancer (age of onset: 1) in his father; Dementia in his mother; Emphysema in his mother.  SOCIAL HISTORY:  reports that he quit smoking about 45 years ago. His smoking use included cigarettes. He has never used smokeless tobacco. He reports that he does not drink alcohol and does not use drugs.  ALLERGIES: Patient has no known allergies.  MEDICATIONS:  Current Outpatient Medications  Medication Sig Dispense Refill   allopurinol (ZYLOPRIM) 100 MG tablet Take 1 tablet (100 mg total) by mouth daily. 90 tablet 1   amLODipine-benazepril (LOTREL) 5-20 MG capsule TAKE 2 CAPSULES ONCE EACH DAY (Patient taking differently: Take 2 capsules by mouth daily.) 60 capsule 1   aspirin EC 81 MG tablet Take 81 mg by mouth daily.     dapagliflozin propanediol (FARXIGA) 10 MG TABS tablet Take 1 tablet (10 mg total) by mouth daily. 90 tablet 1   glucose blood test strip 1 each by Other route as needed for other. Use contour next test strip to check sugar 2 times daily 200 each 4   lovastatin (MEVACOR)  40 MG tablet Take 1 tablet (40 mg total) by mouth daily. 90 tablet 1   metFORMIN (GLUCOPHAGE-XR) 500 MG 24 hr tablet Take 2 tablets (1,000 mg total) by mouth 2 (two) times daily. 360 tablet 1   Omega-3 1000 MG CAPS Take 1,000 mg by mouth daily. Fish oil     pioglitazone (ACTOS) 45 MG tablet Take 1 tablet (45 mg total) by mouth daily. 90 tablet 1   sildenafil (REVATIO) 20 MG tablet Take 1-5 tablets (20-100 mg total) by mouth as needed. (Patient taking differently: Take 20-100 mg by mouth  daily as needed (ED).) 50 tablet 12   sitaGLIPtin (JANUVIA) 100 MG tablet Take 1 tablet (100 mg total) by mouth daily. 90 tablet 1   timolol (TIMOPTIC) 0.5 % ophthalmic solution Place 1 drop into both eyes 2 (two) times daily.     No current facility-administered medications for this encounter.    ECOG PERFORMANCE STATUS:  0 - Asymptomatic  REVIEW OF SYSTEMS: Patient denies any weight loss, fatigue, weakness, fever, chills or night sweats. Patient denies any loss of vision, blurred vision. Patient denies any ringing  of the ears or hearing loss. No irregular heartbeat. Patient denies heart murmur or history of fainting. Patient denies any chest pain or pain radiating to her upper extremities. Patient denies any shortness of breath, difficulty breathing at night, cough or hemoptysis. Patient denies any swelling in the lower legs. Patient denies any nausea vomiting, vomiting of blood, or coffee ground material in the vomitus. Patient denies any stomach pain. Patient states has had normal bowel movements no significant constipation or diarrhea. Patient denies any dysuria, hematuria or significant nocturia. Patient denies any problems walking, swelling in the joints or loss of balance. Patient denies any skin changes, loss of hair or loss of weight. Patient denies any excessive worrying or anxiety or significant depression. Patient denies any problems with insomnia. Patient denies excessive thirst, polyuria, polydipsia. Patient denies any swollen glands, patient denies easy bruising or easy bleeding. Patient denies any recent infections, allergies or URI. Patient "s visual fields have not changed significantly in recent time.   PHYSICAL EXAM: BP (!) (P) 146/74 (BP Location: Left Arm, Patient Position: Sitting)   Temp (!) (P) 96.5 F (35.8 C) (Tympanic)   Resp (P) 16   Wt (P) 207 lb 4.8 oz (94 kg)   BMI (P) 30.61 kg/m  Well-developed well-nourished patient in NAD. HEENT reveals PERLA, EOMI, discs not  visualized.  Oral cavity is clear. No oral mucosal lesions are identified. Neck is clear without evidence of cervical or supraclavicular adenopathy. Lungs are clear to A&P. Cardiac examination is essentially unremarkable with regular rate and rhythm without murmur rub or thrill. Abdomen is benign with no organomegaly or masses noted. Motor sensory and DTR levels are equal and symmetric in the upper and lower extremities. Cranial nerves II through XII are grossly intact. Proprioception is intact. No peripheral adenopathy or edema is identified. No motor or sensory levels are noted. Crude visual fields are within normal range.  LABORATORY DATA: Pathology report reviewed    RADIOLOGY RESULTS: PET CT scan reviewed and ultrasound used for his volume study today   IMPRESSION: Recurrent adenocarcinoma the prostate and patient treated close to 6 years prior with external beam treatment now for I-125 interstitial implant for salvage in 70 year old male  PLAN: At this time patient is cleared to go ahead with I-125 interstitial implant.  Volume study was performed for source placement.  Risks and benefits of procedure  including general anesthesia radiation safety precautions increased lower urinary tract symptoms diarrhea fatigue alteration blood counts all were discussed in detail with the patient and his wife.  They both seem to comprehend my treatment plan well.  I would like to take this opportunity to thank you for allowing me to participate in the care of your patient.Noreene Filbert, MD

## 2021-02-27 NOTE — H&P (View-Only) (Signed)
NEW PATIENT EVALUATION  Name: Troy Christensen  MRN: [id number]  Date:   02/27/2021     DOB: Jun 19, 1951   This 70 y.o. male patient presents to the clinic for history and physical in preparation of I-125 interstitial implant for salvage and patient previously treated over 5 years 1/2 years prior with IMRT radiation therapy for stage IIb Gleason 8 (4+4) adenocarcinoma presenting with a PSA of 6.3 REFERRING PHYSICIAN: Valerie Roys, DO  CHIEF COMPLAINT:  Chief Complaint  Patient presents with   Prostate Cancer    Post volume study    DIAGNOSIS: The encounter diagnosis was Malignant neoplasm of prostate ([place]).   PREVIOUS INVESTIGATIONS:  PET CT scans reviewed Clinical notes reviewed Pathology reviewed  HPI: Patient is a prior patient of mine 70 years old status post image guided IMRT radiation therapy close to 6 years prior for stage IIb Gleason 8 (4+4) adenocarcinoma who presented with biochemical failure.  PSMA PET scan showed only hypermetabolic to be in his prostate with no evidence of bony metastatic metastatic disease or adenopathy.  He is asymptomatic.  We opted to go ahead with salvage I-125 interstitial implant with a boost dose.  Seen today for his volume study anticipation of implant and is doing well.  He states he is having very little lower urinary tract symptoms or abdominal complaints.  PLANNED TREATMENT REGIMEN: I-125 interstitial implant for boost  PAST MEDICAL HISTORY:  has a past medical history of Cancer ([place]), Diabetes mellitus without complication ([place]), Glaucoma, Gout, Hypercholesteremia, Hypertension, Osteoarthritis, and Prostate cancer ([place]).    PAST SURGICAL HISTORY:  Past Surgical History:  Procedure Laterality Date   EYE SURGERY Bilateral    march and april 2021- [place]    FLEXIBLE SIGMOIDOSCOPY N/A 01/10/2016   Procedure: FLEXIBLE SIGMOIDOSCOPY with  argon plasma coagulation;  Surgeon: Lucilla Lame, MD;  Location: [facility];  Service:  Endoscopy;  Laterality: N/A;  Diabetic - oral meds   HERNIA REPAIR     JOINT REPLACEMENT  2014   Right Knee Replacement   MENISCUS REPAIR Left    nephrolithiasis     pt denies   PROSTATE SURGERY     Radiation treatments - no surgery   RETINAL DETACHMENT SURGERY Bilateral    One surgery in March 2021, and Second in April 2021   ROTATOR CUFF REPAIR Right 2012    FAMILY HISTORY: family history includes Cancer in his brother; Cancer (age of onset: 70) in his father; Dementia in his mother; Emphysema in his mother.  SOCIAL HISTORY:  reports that he quit smoking about 45 years ago. His smoking use included cigarettes. He has never used smokeless tobacco. He reports that he does not drink alcohol and does not use drugs.  ALLERGIES: Patient has no known allergies.  MEDICATIONS:  Current Outpatient Medications  Medication Sig Dispense Refill   allopurinol (ZYLOPRIM) 100 MG tablet Take 1 tablet (100 mg total) by mouth daily. 90 tablet 1   amLODipine-benazepril (LOTREL) 5-20 MG capsule TAKE 2 CAPSULES ONCE EACH DAY (Patient taking differently: Take 2 capsules by mouth daily.) 60 capsule 1   aspirin EC 81 MG tablet Take 81 mg by mouth daily.     dapagliflozin propanediol (FARXIGA) 10 MG TABS tablet Take 1 tablet (10 mg total) by mouth daily. 90 tablet 1   glucose blood test strip 1 each by Other route as needed for other. Use contour next test strip to check sugar 2 times daily 200 each 4   lovastatin (MEVACOR)  40 MG tablet Take 1 tablet (40 mg total) by mouth daily. 90 tablet 1   metFORMIN (GLUCOPHAGE-XR) 500 MG 24 hr tablet Take 2 tablets (1,000 mg total) by mouth 2 (two) times daily. 360 tablet 1   Omega-3 1000 MG CAPS Take 1,000 mg by mouth daily. Fish oil     pioglitazone (ACTOS) 45 MG tablet Take 1 tablet (45 mg total) by mouth daily. 90 tablet 1   sildenafil (REVATIO) 20 MG tablet Take 1-5 tablets (20-100 mg total) by mouth as needed. (Patient taking differently: Take 20-100 mg by mouth  daily as needed (ED).) 50 tablet 12   sitaGLIPtin (JANUVIA) 100 MG tablet Take 1 tablet (100 mg total) by mouth daily. 90 tablet 1   timolol (TIMOPTIC) 0.5 % ophthalmic solution Place 1 drop into both eyes 2 (two) times daily.     No current facility-administered medications for this encounter.    ECOG PERFORMANCE STATUS:  0 - Asymptomatic  REVIEW OF SYSTEMS: Patient denies any weight loss, fatigue, weakness, fever, chills or night sweats. Patient denies any loss of vision, blurred vision. Patient denies any ringing  of the ears or hearing loss. No irregular heartbeat. Patient denies heart murmur or history of fainting. Patient denies any chest pain or pain radiating to her upper extremities. Patient denies any shortness of breath, difficulty breathing at night, cough or hemoptysis. Patient denies any swelling in the lower legs. Patient denies any nausea vomiting, vomiting of blood, or coffee ground material in the vomitus. Patient denies any stomach pain. Patient states has had normal bowel movements no significant constipation or diarrhea. Patient denies any dysuria, hematuria or significant nocturia. Patient denies any problems walking, swelling in the joints or loss of balance. Patient denies any skin changes, loss of hair or loss of weight. Patient denies any excessive worrying or anxiety or significant depression. Patient denies any problems with insomnia. Patient denies excessive thirst, polyuria, polydipsia. Patient denies any swollen glands, patient denies easy bruising or easy bleeding. Patient denies any recent infections, allergies or URI. Patient "s visual fields have not changed significantly in recent time.   PHYSICAL EXAM: BP (!) (P) 146/74 (BP Location: Left Arm, Patient Position: Sitting)   Temp (!) (P) 96.5 F (35.8 C) (Tympanic)   Resp (P) 16   Wt (P) 207 lb 4.8 oz (94 kg)   BMI (P) 30.61 kg/m  Well-developed well-nourished patient in NAD. HEENT reveals PERLA, EOMI, discs not  visualized.  Oral cavity is clear. No oral mucosal lesions are identified. Neck is clear without evidence of cervical or supraclavicular adenopathy. Lungs are clear to A&P. Cardiac examination is essentially unremarkable with regular rate and rhythm without murmur rub or thrill. Abdomen is benign with no organomegaly or masses noted. Motor sensory and DTR levels are equal and symmetric in the upper and lower extremities. Cranial nerves II through XII are grossly intact. Proprioception is intact. No peripheral adenopathy or edema is identified. No motor or sensory levels are noted. Crude visual fields are within normal range.  LABORATORY DATA: Pathology report reviewed    RADIOLOGY RESULTS: PET CT scan reviewed and ultrasound used for his volume study today   IMPRESSION: Recurrent adenocarcinoma the prostate and patient treated close to 6 years prior with external beam treatment now for I-125 interstitial implant for salvage in 70 year old male  PLAN: At this time patient is cleared to go ahead with I-125 interstitial implant.  Volume study was performed for source placement.  Risks and benefits of procedure  including general anesthesia radiation safety precautions increased lower urinary tract symptoms diarrhea fatigue alteration blood counts all were discussed in detail with the patient and his wife.  They both seem to comprehend my treatment plan well.  I would like to take this opportunity to thank you for allowing me to participate in the care of your patient.Noreene Filbert, MD

## 2021-03-04 DIAGNOSIS — C61 Malignant neoplasm of prostate: Secondary | ICD-10-CM | POA: Diagnosis not present

## 2021-03-07 ENCOUNTER — Other Ambulatory Visit: Payer: Self-pay | Admitting: Urology

## 2021-03-07 DIAGNOSIS — C61 Malignant neoplasm of prostate: Secondary | ICD-10-CM

## 2021-03-07 MED ORDER — DEXTROSE 5 % IV SOLN: 2.0000 g | Freq: Three times a day (TID) | INTRAVENOUS | Status: AC

## 2021-03-15 ENCOUNTER — Other Ambulatory Visit: Payer: Self-pay

## 2021-03-15 ENCOUNTER — Encounter
Admission: RE | Admit: 2021-03-15 | Discharge: 2021-03-15 | Disposition: A | Discharge status: Home / Self Care | Payer: Medicare Other | Source: Ambulatory Visit | Attending: Urology | Admitting: Urology

## 2021-03-15 NOTE — Patient Instructions (Addendum)
Your procedure is scheduled on: 03/25/21 Report to [place]. To find out your arrival time please call [phone] between 1PM - 3PM on 03/22/21.  Remember: Instructions that are not followed completely may result in serious medical risk, up to and including death, or upon the discretion of your surgeon and anesthesiologist your surgery may need to be rescheduled.     _X__ 1. Do not eat food or drink liquids after midnight the night before your procedure.                 No gum chewing or hard candies.   __X__2.  On the morning of surgery brush your teeth with toothpaste and water, you                 may rinse your mouth with mouthwash if you wish.  Do not swallow any              toothpaste of mouthwash.     _X__ 3.  No Alcohol for 24 hours before or after surgery.   _X__ 4.  Do Not Smoke or use e-cigarettes For 24 Hours Prior to Your Surgery.                 Do not use any chewable tobacco products for at least 6 hours prior to                 surgery.  ____  5.  Bring all medications with you on the day of surgery if instructed.   __X__  6.  Notify your doctor if there is any change in your medical condition      (cold, fever, infections).     Do not wear jewelry, make-up, hairpins, clips or nail polish. Do not wear lotions, powders, or perfumes.  Do not shave 48 hours prior to surgery. Men may shave face and neck. Do not bring valuables to the hospital.    [facility] is not responsible for any belongings or valuables.  Contacts, dentures/partials or body piercings may not be worn into surgery. Bring a case for your contacts, glasses or hearing aids, a denture cup will be supplied. Leave your suitcase in the car. After surgery it may be brought to your room. For patients admitted to the hospital, discharge time is determined by your treatment team.   Patients discharged the day of surgery will not be allowed to drive  home.   Please read over the following fact sheets that you were given:   Chg soap, Enema  __X__ Take these medicines the morning of surgery with A SIP OF WATER:    1. none  2.   3.   4.  5.  6.  __X__ Fleet Enema (as directed) PRIOR TO ARRIVAL THE DAY OF YOUR PROCEDURE  __X__ Use CHG Soap/SAGE wipes as directed  ____ Use inhalers on the day of surgery  __X__ Stop metformin/Janumet/Farxiga 2 days prior to surgery    ____ Take 1/2 of usual insulin dose the night before surgery. No insulin the morning          of surgery.   ____ Stop Blood Thinners Coumadin/Plavix/Xarelto/Pleta/Pradaxa/Eliquis/Effient/Aspirin  on   Or contact your Surgeon, Cardiologist or Medical Doctor regarding  ability to stop your blood thinners  __X__ Stop Anti-inflammatories 7 days before surgery such as Advil, Ibuprofen, Motrin,  BC or Goodies Powder, Naprosyn, Naproxen, Aleve   __X__ Stop all herbal supplements,  fish oil or vitamin E until after surgery.    ____ Bring C-Pap to the hospital.

## 2021-03-17 DIAGNOSIS — Z20822 Contact with and (suspected) exposure to covid-19: Secondary | ICD-10-CM | POA: Diagnosis not present

## 2021-03-20 ENCOUNTER — Encounter
Admission: RE | Admit: 2021-03-20 | Discharge: 2021-03-20 | Disposition: A | Discharge status: Home / Self Care | Payer: Medicare Other | Source: Ambulatory Visit | Attending: Urology | Admitting: Urology

## 2021-03-20 ENCOUNTER — Other Ambulatory Visit: Payer: Self-pay

## 2021-03-20 DIAGNOSIS — I10 Essential (primary) hypertension: Secondary | ICD-10-CM | POA: Diagnosis not present

## 2021-03-20 DIAGNOSIS — Z01810 Encounter for preprocedural cardiovascular examination: Secondary | ICD-10-CM | POA: Diagnosis not present

## 2021-03-25 ENCOUNTER — Encounter: Payer: Self-pay | Admitting: Urology

## 2021-03-25 ENCOUNTER — Ambulatory Visit
Admission: RE | Admit: 2021-03-25 | Discharge: 2021-03-25 | Disposition: A | Discharge status: Home / Self Care | Payer: Medicare Other | Attending: Urology | Admitting: Urology

## 2021-03-25 ENCOUNTER — Ambulatory Visit: Payer: Medicare Other

## 2021-03-25 ENCOUNTER — Encounter
Admission: RE | Disposition: A | Discharge status: Home / Self Care | Payer: Self-pay | Source: Home / Self Care | Attending: Urology

## 2021-03-25 ENCOUNTER — Ambulatory Visit: Payer: Medicare Other | Admitting: Certified Registered"

## 2021-03-25 ENCOUNTER — Other Ambulatory Visit: Payer: Self-pay

## 2021-03-25 DIAGNOSIS — E1139 Type 2 diabetes mellitus with other diabetic ophthalmic complication: Secondary | ICD-10-CM | POA: Insufficient documentation

## 2021-03-25 DIAGNOSIS — I10 Essential (primary) hypertension: Secondary | ICD-10-CM | POA: Insufficient documentation

## 2021-03-25 DIAGNOSIS — Z7984 Long term (current) use of oral hypoglycemic drugs: Secondary | ICD-10-CM | POA: Insufficient documentation

## 2021-03-25 DIAGNOSIS — H42 Glaucoma in diseases classified elsewhere: Secondary | ICD-10-CM | POA: Insufficient documentation

## 2021-03-25 DIAGNOSIS — N401 Enlarged prostate with lower urinary tract symptoms: Secondary | ICD-10-CM | POA: Diagnosis not present

## 2021-03-25 DIAGNOSIS — Z87891 Personal history of nicotine dependence: Secondary | ICD-10-CM | POA: Insufficient documentation

## 2021-03-25 DIAGNOSIS — C61 Malignant neoplasm of prostate: Secondary | ICD-10-CM | POA: Insufficient documentation

## 2021-03-25 DIAGNOSIS — R310 Gross hematuria: Secondary | ICD-10-CM | POA: Insufficient documentation

## 2021-03-25 DIAGNOSIS — Z825 Family history of asthma and other chronic lower respiratory diseases: Secondary | ICD-10-CM | POA: Diagnosis not present

## 2021-03-25 DIAGNOSIS — Z79899 Other long term (current) drug therapy: Secondary | ICD-10-CM | POA: Insufficient documentation

## 2021-03-25 DIAGNOSIS — Z809 Family history of malignant neoplasm, unspecified: Secondary | ICD-10-CM | POA: Insufficient documentation

## 2021-03-25 DIAGNOSIS — Z820 Family history of epilepsy and other diseases of the nervous system: Secondary | ICD-10-CM | POA: Insufficient documentation

## 2021-03-25 DIAGNOSIS — Z96651 Presence of right artificial knee joint: Secondary | ICD-10-CM | POA: Insufficient documentation

## 2021-03-25 DIAGNOSIS — R339 Retention of urine, unspecified: Secondary | ICD-10-CM | POA: Insufficient documentation

## 2021-03-25 DIAGNOSIS — Z923 Personal history of irradiation: Secondary | ICD-10-CM | POA: Diagnosis not present

## 2021-03-25 DIAGNOSIS — E7800 Pure hypercholesterolemia, unspecified: Secondary | ICD-10-CM | POA: Insufficient documentation

## 2021-03-25 DIAGNOSIS — E119 Type 2 diabetes mellitus without complications: Secondary | ICD-10-CM | POA: Insufficient documentation

## 2021-03-25 DIAGNOSIS — Z7982 Long term (current) use of aspirin: Secondary | ICD-10-CM | POA: Insufficient documentation

## 2021-03-25 HISTORY — PX: RADIOACTIVE SEED IMPLANT: SHX5150

## 2021-03-25 LAB — URINALYSIS, COMPLETE (UACMP) WITH MICROSCOPIC
Bacteria, UA: NONE SEEN
RBC / HPF: >50 RBC/hpf — ABNORMAL HIGH (ref 0–5)
Specific Gravity, Urine: 1.027 (ref 1.005–1.030)
Squamous Epithelial / HPF: NONE SEEN (ref 0–5)

## 2021-03-25 LAB — GLUCOSE, CAPILLARY
Glucose-Capillary: 173 mg/dL — ABNORMAL HIGH (ref 70–99)
Glucose-Capillary: 145 mg/dL — ABNORMAL HIGH (ref 70–99)

## 2021-03-25 SURGERY — INSERTION, RADIATION SOURCE, PROSTATE
Anesthesia: General

## 2021-03-25 MED ORDER — PROPOFOL 10 MG/ML IV BOLUS
INTRAVENOUS | Status: AC
  Filled 2021-03-25: qty 20

## 2021-03-25 MED ORDER — MIDAZOLAM HCL 2 MG/2ML IJ SOLN
INTRAMUSCULAR | Status: DC | PRN
  Administered 2021-03-25: 2 mg via INTRAVENOUS

## 2021-03-25 MED ORDER — TAMSULOSIN HCL 0.4 MG PO CAPS: 0.4000 mg | ORAL_CAPSULE | Freq: Every day | ORAL | 0 refills | Status: DC

## 2021-03-25 MED ORDER — SODIUM CHLORIDE 0.9 % IV SOLN: INTRAVENOUS | Status: DC

## 2021-03-25 MED ORDER — FENTANYL CITRATE (PF) 100 MCG/2ML IJ SOLN
INTRAMUSCULAR | Status: DC | PRN
  Administered 2021-03-25 (×2): 50 ug via INTRAVENOUS

## 2021-03-25 MED ORDER — ACETAMINOPHEN 10 MG/ML IV SOLN
INTRAVENOUS | Status: DC | PRN
  Administered 2021-03-25: 1000 mg via INTRAVENOUS

## 2021-03-25 MED ORDER — CHLORHEXIDINE GLUCONATE 0.12 % MT SOLN: 15.0000 mL | Freq: Once | OROMUCOSAL | Status: AC

## 2021-03-25 MED ORDER — FENTANYL CITRATE (PF) 100 MCG/2ML IJ SOLN: 25.0000 ug | INTRAMUSCULAR | Status: DC | PRN

## 2021-03-25 MED ORDER — MIDAZOLAM HCL 2 MG/2ML IJ SOLN
INTRAMUSCULAR | Status: AC
  Filled 2021-03-25: qty 2

## 2021-03-25 MED ORDER — ORAL CARE MOUTH RINSE: 15.0000 mL | Freq: Once | OROMUCOSAL | Status: AC

## 2021-03-25 MED ORDER — FLEET ENEMA 7-19 GM/118ML RE ENEM
1.0000 | ENEMA | Freq: Once | RECTAL | Status: DC
Start: 2021-03-26 — End: 2021-03-25

## 2021-03-25 MED ORDER — ONDANSETRON HCL 4 MG/2ML IJ SOLN: 4.0000 mg | Freq: Once | INTRAMUSCULAR | Status: DC | PRN

## 2021-03-25 MED ORDER — CEFAZOLIN SODIUM-DEXTROSE 1-4 GM/50ML-% IV SOLN
INTRAVENOUS | Status: DC | PRN
  Administered 2021-03-25: 2 g via INTRAVENOUS

## 2021-03-25 MED ORDER — BACITRACIN 500 UNIT/GM EX OINT
TOPICAL_OINTMENT | CUTANEOUS | Status: DC | PRN
  Administered 2021-03-25: 1 via TOPICAL

## 2021-03-25 MED ORDER — DEXMEDETOMIDINE (PRECEDEX) IN NS 20 MCG/5ML (4 MCG/ML) IV SYRINGE
PREFILLED_SYRINGE | INTRAVENOUS | Status: DC | PRN
  Administered 2021-03-25: 4 ug via INTRAVENOUS
  Administered 2021-03-25 (×2): 8 ug via INTRAVENOUS

## 2021-03-25 MED ORDER — STERILE WATER FOR IRRIGATION IR SOLN
Status: DC | PRN
  Administered 2021-03-25: 1000 mL

## 2021-03-25 MED ORDER — PROPOFOL 10 MG/ML IV BOLUS
INTRAVENOUS | Status: DC | PRN
  Administered 2021-03-25: 50 mg via INTRAVENOUS
  Administered 2021-03-25: 150 mg via INTRAVENOUS

## 2021-03-25 MED ORDER — LIDOCAINE HCL (CARDIAC) PF 100 MG/5ML IV SOSY
PREFILLED_SYRINGE | INTRAVENOUS | Status: DC | PRN
  Administered 2021-03-25: 50 mg via INTRAVENOUS

## 2021-03-25 MED ORDER — OXYCODONE HCL 5 MG PO TABS: 5.0000 mg | ORAL_TABLET | Freq: Once | ORAL | Status: DC | PRN

## 2021-03-25 MED ORDER — CIPROFLOXACIN HCL 500 MG PO TABS: 500.0000 mg | ORAL_TABLET | Freq: Two times a day (BID) | ORAL | 0 refills | Status: DC

## 2021-03-25 MED ORDER — ROCURONIUM BROMIDE 100 MG/10ML IV SOLN
INTRAVENOUS | Status: DC | PRN
  Administered 2021-03-25: 50 mg via INTRAVENOUS

## 2021-03-25 MED ORDER — FENTANYL CITRATE (PF) 100 MCG/2ML IJ SOLN
INTRAMUSCULAR | Status: AC
  Filled 2021-03-25: qty 2

## 2021-03-25 MED ORDER — DEXAMETHASONE SODIUM PHOSPHATE 10 MG/ML IJ SOLN
INTRAMUSCULAR | Status: DC | PRN
  Administered 2021-03-25: 5 mg via INTRAVENOUS

## 2021-03-25 MED ORDER — SUGAMMADEX SODIUM 200 MG/2ML IV SOLN
INTRAVENOUS | Status: DC | PRN
  Administered 2021-03-25: 200 mg via INTRAVENOUS

## 2021-03-25 MED ORDER — CHLORHEXIDINE GLUCONATE 0.12 % MT SOLN
OROMUCOSAL | Status: AC
  Administered 2021-03-25: 15 mL via OROMUCOSAL
  Filled 2021-03-25: qty 15

## 2021-03-25 MED ORDER — FAMOTIDINE 20 MG PO TABS
ORAL_TABLET | ORAL | Status: AC
  Administered 2021-03-25: 20 mg via ORAL
  Filled 2021-03-25: qty 1

## 2021-03-25 MED ORDER — BACITRACIN ZINC 500 UNIT/GM EX OINT
TOPICAL_OINTMENT | CUTANEOUS | Status: AC
  Filled 2021-03-25: qty 28.35

## 2021-03-25 MED ORDER — OXYCODONE HCL 5 MG/5ML PO SOLN: 5.0000 mg | Freq: Once | ORAL | Status: DC | PRN

## 2021-03-25 MED ORDER — ACETAMINOPHEN 10 MG/ML IV SOLN: 1000.0000 mg | Freq: Once | INTRAVENOUS | Status: DC | PRN

## 2021-03-25 MED ORDER — FAMOTIDINE 20 MG PO TABS: 20.0000 mg | ORAL_TABLET | Freq: Once | ORAL | Status: AC

## 2021-03-25 SURGICAL SUPPLY — 27 items
BAG DRN RND TRDRP ANRFLXCHMBR (UROLOGICAL SUPPLIES) ×1
BAG URINE DRAIN 2000ML AR STRL (UROLOGICAL SUPPLIES) ×2 IMPLANT
BLADE CLIPPER SURG (BLADE) ×2 IMPLANT
CATH FOL 2WAY LX 16X5 (CATHETERS) ×2 IMPLANT
COVER BACK TABLE REUSABLE LG (DRAPES) ×2 IMPLANT
DRAPE INCISE 23X17 IOBAN STRL (DRAPES) ×1
DRAPE INCISE 23X17 STRL (DRAPES) ×1 IMPLANT
DRAPE INCISE IOBAN 23X17 STRL (DRAPES) ×1 IMPLANT
DRAPE UNDER BUTTOCK W/FLU (DRAPES) ×2 IMPLANT
DRSG TELFA 3X8 NADH (GAUZE/BANDAGES/DRESSINGS) ×2 IMPLANT
GAUZE 4X4 16PLY ~~LOC~~+RFID DBL (SPONGE) ×4 IMPLANT
GLOVE SURG ENC MOIS LTX SZ6.5 (GLOVE) ×4 IMPLANT
GLOVE SURG ENC MOIS LTX SZ7.5 (GLOVE) ×4 IMPLANT
GOWN STRL REUS W/ TWL LRG LVL3 (GOWN DISPOSABLE) ×2 IMPLANT
GOWN STRL REUS W/ TWL XL LVL3 (GOWN DISPOSABLE) ×1 IMPLANT
GOWN STRL REUS W/TWL LRG LVL3 (GOWN DISPOSABLE) ×4
GOWN STRL REUS W/TWL XL LVL3 (GOWN DISPOSABLE) ×2
IV NS 1000ML (IV SOLUTION) ×2
IV NS 1000ML BAXH (IV SOLUTION) ×1 IMPLANT
KIT TURNOVER CYSTO (KITS) ×2 IMPLANT
MANIFOLD NEPTUNE II (INSTRUMENTS) ×2 IMPLANT
PACK CYSTO AR (MISCELLANEOUS) ×2 IMPLANT
PAD DRESSING TELFA 3X8 NADH (GAUZE/BANDAGES/DRESSINGS) ×1 IMPLANT
SET CYSTO W/LG BORE CLAMP LF (SET/KITS/TRAYS/PACK) ×2 IMPLANT
SURGILUBE 2OZ TUBE FLIPTOP (MISCELLANEOUS) ×2 IMPLANT
SYR 10ML LL (SYRINGE) ×2 IMPLANT
WATER STERILE IRR 1000ML POUR (IV SOLUTION) ×2 IMPLANT

## 2021-03-25 NOTE — Transfer of Care (Signed)
Immediate Anesthesia Transfer of Care Note  Patient: Troy Christensen  Procedure(s) Performed: RADIOACTIVE SEED IMPLANT/BRACHYTHERAPY IMPLANT  Patient Location: PACU  Anesthesia Type:General  Level of Consciousness: awake and drowsy  Airway & Oxygen Therapy: Patient Spontanous Breathing and Patient connected to face mask oxygen  Post-op Assessment: Report given to RN and Post -op Vital signs reviewed and stable  Post vital signs: Reviewed and stable  Last Vitals:  Vitals Value Taken Time  BP 111/60 03/25/21 0850  Temp    Pulse 32 03/25/21 0852  Resp 30 03/25/21 0852  SpO2 100 % 03/25/21 0852  Vitals shown include unvalidated device data.  Last Pain:  Vitals:   03/25/21 0981  TempSrc: Temporal  PainSc: 0-No pain         Complications: No notable events documented.

## 2021-03-25 NOTE — ED Notes (Signed)
Bladder scan shows 846ml

## 2021-03-25 NOTE — Anesthesia Procedure Notes (Signed)
Procedure Name: Intubation Date/Time: 03/25/2021 7:50 AM Performed by: Arita Miss, MD Pre-anesthesia Checklist: Patient identified, Patient being monitored, Timeout performed, Emergency Drugs available and Suction available Patient Re-evaluated:Patient Re-evaluated prior to induction Oxygen Delivery Method: Circle system utilized Preoxygenation: Pre-oxygenation with 100% oxygen Induction Type: IV induction Ventilation: Mask ventilation without difficulty Laryngoscope Size: Mac, McGraph and 4 Grade View: Grade I Tube type: Oral Tube size: 7.0 mm Number of attempts: 1 Airway Equipment and Method: Stylet Placement Confirmation: ETT inserted through vocal cords under direct vision, positive ETCO2 and breath sounds checked- equal and bilateral Secured at: 21 cm Tube secured with: Tape Dental Injury: Teeth and Oropharynx as per pre-operative assessment

## 2021-03-25 NOTE — Discharge Instructions (Signed)

## 2021-03-25 NOTE — ED Notes (Signed)
Foley drained 1231ml of bloody urine

## 2021-03-25 NOTE — Progress Notes (Signed)
Radiation Oncology Follow up Note  Name: Troy Christensen   Date:   01/23/2021 MRN:  [id number] DOB: [birth year]    This 70 y.o. male presents to the OR today for I-125 interstitial implant for salvage radiation in patient treated over 6 years prior with IMRT radiation therapy for stage IIb Gleason 8 (4+4 adenocarcinoma presenting with a PSA of 6.3 REFERRING PROVIDER: No ref. provider found  HPI: Patient is a 70 year old male originally treated back 6 years prior for stage IIb Gleason 8 adenocarcinoma the prostate.  He presented with biochemical failure PSMA PET scan showed hypermetabolic activity only in the prostate with no evidence of bony metastatic disease.  We opted to go ahead with salvage I-125 interstitial implant.  And he was taken to the OR today for seed placement..  COMPLICATIONS OF TREATMENT: none  FOLLOW UP COMPLIANCE: keeps appointments   PHYSICAL EXAM:  BP (!) 142/73   Pulse 61   Temp (!) 96.9 F (36.1 C) (Temporal)   Resp 18   Ht 5\' 10"  (1.778 m)   Wt 208 lb (94.3 kg)   SpO2 99%   BMI 29.84 kg/m  Well-developed well-nourished patient in NAD. HEENT reveals PERLA, EOMI, discs not visualized.  Oral cavity is clear. No oral mucosal lesions are identified. Neck is clear without evidence of cervical or supraclavicular adenopathy. Lungs are clear to A&P. Cardiac examination is essentially unremarkable with regular rate and rhythm without murmur rub or thrill. Abdomen is benign with no organomegaly or masses noted. Motor sensory and DTR levels are equal and symmetric in the upper and lower extremities. Cranial nerves II through XII are grossly intact. Proprioception is intact. No peripheral adenopathy or edema is identified. No motor or sensory levels are noted. Crude visual fields are within normal range.  RADIOLOGY RESULTS: Ultrasound used for placement.  PLAN: Patient was taken to the operating room and general anesthesia was administered. Legs were immobilized in stirrups  and patient was positioned in the exact same proportions as original volume study. Patient was prepped and Foley catheter was placed. Ultrasound guidance identified the prostate and recreated the original set up as per treatment planning volume study.  19 needles were placed under ultrasound guidance with PVCs delivered to the prostate volume. After completion of procedure cystoscopy was performed by urology and no evidence of seeds in the bladder were noted. Patient tolerated the procedure extremely well. Initial plain film as doublecheck identified 57 seeds in the prostate. Patient has followup appointment in one month for CT scan for quality assurance will be performed.    Noreene Filbert, MD

## 2021-03-25 NOTE — Anesthesia Preprocedure Evaluation (Signed)
Anesthesia Evaluation  Patient identified by MRN, date of birth, ID band Patient awake    Reviewed: Allergy & Precautions, NPO status , Patient's Chart, lab work & pertinent test results  History of Anesthesia Complications Negative for: history of anesthetic complications  Airway Mallampati: II  TM Distance: >3 FB Neck ROM: Full    Dental  (+) Teeth Intact   Pulmonary neg pulmonary ROS, neg sleep apnea, neg COPD, Patient abstained from smoking.Not current smoker, former smoker,    Pulmonary exam normal breath sounds clear to auscultation       Cardiovascular Exercise Tolerance: Good METShypertension, (-) CAD and (-) Past MI (-) dysrhythmias  Rhythm:Regular Rate:Normal - Systolic murmurs    Neuro/Psych negative neurological ROS  negative psych ROS   GI/Hepatic neg GERD  ,(+)     (-) substance abuse  ,   Endo/Other  diabetes  Renal/GU negative Renal ROS     Musculoskeletal   Abdominal   Peds  Hematology   Anesthesia Other Findings Past Medical History: No date: Cancer ([place])     Comment:  Prostate No date: Diabetes mellitus without complication ([facility]) No date: Glaucoma No date: Gout No date: Hypercholesteremia No date: Hypertension No date: Osteoarthritis     Comment:  hands No date: Prostate cancer ([facility])  Reproductive/Obstetrics                            Anesthesia Physical Anesthesia Plan  ASA: 2  Anesthesia Plan: General   Post-op Pain Management:    Induction: Intravenous  PONV Risk Score and Plan: 2 and Ondansetron and Dexamethasone  Airway Management Planned: Oral ETT  Additional Equipment: None  Intra-op Plan:   Post-operative Plan: Extubation in OR  Informed Consent: I have reviewed the patients History and Physical, chart, labs and discussed the procedure including the risks, benefits and alternatives for the proposed anesthesia with the patient or authorized  representative who has indicated his/her understanding and acceptance.     Dental advisory given  Plan Discussed with: CRNA and Surgeon  Anesthesia Plan Comments: (Discussed risks of anesthesia with patient, including PONV, sore throat, lip/dental damage. Rare risks discussed as well, such as cardiorespiratory and neurological sequelae. Patient understands.)        Anesthesia Quick Evaluation

## 2021-03-25 NOTE — Anesthesia Postprocedure Evaluation (Signed)
Anesthesia Post Note  Patient: Troy Christensen  Procedure(s) Performed: RADIOACTIVE SEED IMPLANT/BRACHYTHERAPY IMPLANT  Patient location during evaluation: PACU Anesthesia Type: General Level of consciousness: awake and alert Pain management: pain level controlled Vital Signs Assessment: post-procedure vital signs reviewed and stable Respiratory status: spontaneous breathing, nonlabored ventilation, respiratory function stable and patient connected to nasal cannula oxygen Cardiovascular status: blood pressure returned to baseline and stable Postop Assessment: no apparent nausea or vomiting Anesthetic complications: no   No notable events documented.   Last Vitals:  Vitals:   03/25/21 0920 03/25/21 0934  BP: 124/76 139/82  Pulse:  61  Resp: 15 18  Temp: (!) 36.2 C (!) 36.1 C  SpO2:  99%    Last Pain:  Vitals:   03/25/21 0934  TempSrc: Temporal  PainSc: 0-No pain                 Arita Miss

## 2021-03-25 NOTE — Op Note (Signed)
Preoperative diagnosis: Adenocarcinoma of the prostate   Postoperative diagnosis: Same   Procedure: I-125 prostate seed implantation, cystoscopy  Surgeon: Hollice Espy M.D. ,   Radiation Oncologist: Lavena Stanford, M.D.   Anesthesia: General  Drains: none  Complications: none  Indications: History of Gleason 4+4 prostate cancer presenting today for salvage brachytherapy  Procedure: Patient was brought to operating suite and placement table in the supine position. At this time, a universal timeout protocol was performed, all team members were identified, Venodyne boots are placed, and he was administered IV Ancef in the preoperative period. He was placed in lithotomy position and prepped and draped in usual manner. Radiation oncology department placed a transrectal ultrasound probe anchoring stand/ grid and aligned with previous imaging from the volume study. Foley catheter was inserted without difficulty.  All needle passage was done with real-time transrectal ultrasound guidance in both the transverse and sagittal plains in order to achieve the desired preplanned position. A total of 19 needles were placed.  59 active seeds were implanted. The Foley catheter was removed and a rigid cystoscopy failed to show any seeds outside the prostate without evidence of trauma to the urethral, prostatic fossa, or bladder.  The bladder was drained.  A fluoroscopic image was then obtained showing excellent distrubution of the brachytherapy seeds.  Each seed was counted and counts were correct.    The patient was then repositioned in the supine position, reversed from anesthesia, and taken to the PACU in stable condition.  Plan: We will have him return in about 4 weeks to recheck his urinary symptoms with IPSS/PVR.  This can be with one of our PAs.

## 2021-03-25 NOTE — ED Triage Notes (Signed)
Pt had radioactive seeds implanted today and now feels like is unable to empty bladder. Has been voiding smalls amounts since 1500.

## 2021-03-25 NOTE — Interval H&P Note (Signed)
History and Physical Interval Note:  03/25/2021 7:24 AM  Troy Christensen  has presented today for surgery, with the diagnosis of Prostate Cancer.  The various methods of treatment have been discussed with the patient and family. After consideration of risks, benefits and other options for treatment, the patient has consented to  Procedure(s): RADIOACTIVE SEED IMPLANT/BRACHYTHERAPY IMPLANT (N/A) as a surgical intervention.  The patient's history has been reviewed, patient examined, no change in status, stable for surgery.  I have reviewed the patient's chart and labs.  Questions were answered to the patient's satisfaction.    RRR CTAB   Hollice Espy

## 2021-03-26 ENCOUNTER — Emergency Department
Admission: EM | Admit: 2021-03-26 | Discharge: 2021-03-26 | Disposition: A | Discharge status: Home / Self Care | Payer: Medicare Other | Source: Home / Self Care | Attending: Emergency Medicine | Admitting: Emergency Medicine

## 2021-03-26 ENCOUNTER — Telehealth: Payer: Self-pay | Admitting: *Deleted

## 2021-03-26 DIAGNOSIS — R310 Gross hematuria: Secondary | ICD-10-CM

## 2021-03-26 DIAGNOSIS — N401 Enlarged prostate with lower urinary tract symptoms: Secondary | ICD-10-CM | POA: Diagnosis not present

## 2021-03-26 DIAGNOSIS — R339 Retention of urine, unspecified: Secondary | ICD-10-CM

## 2021-03-26 DIAGNOSIS — C61 Malignant neoplasm of prostate: Secondary | ICD-10-CM | POA: Diagnosis not present

## 2021-03-26 NOTE — Telephone Encounter (Signed)
Patient had a cathter placement this morning in the Ed , He states he needs to be seen for a removal of the cathter . When would you like me to schedule v &t ?

## 2021-03-26 NOTE — Discharge Instructions (Addendum)
As we discussed, please keep the Foley catheter in place draining to your leg bag.  Follow-up with Dr. Erlene Quan in the clinic.  If you develop any fevers or nonfunctioning catheter, please return to the ED.

## 2021-03-26 NOTE — ED Provider Notes (Signed)
[facility] Emergency Department Provider Note ____________________________________________   Event Date/Time   First MD Initiated Contact with Patient 03/26/21 0120     (approximate)  I have reviewed the triage vital signs and the nursing notes.  HISTORY  Chief Complaint Urinary Retention   HPI Troy Christensen is a 70 y.o. malewho presents to the ED for evaluation of dribbling urine and sensation of incomplete emptying.  Chart review indicates history of prostate cancer followed by urology, Dr. Erlene Quan.  Patient had radioactive seed placement earlier today on 7/11. Otherwise history of HLD, DM on oral agents, aspirin 81 but no proper anticoagulation.  Patient presents to the ED for evaluation of suprapubic abdominal pain increasingly throughout the afternoon today on 7/11.  He reports finishing his procedure this morning around lunchtime, and this afternoon had increasing abdominal pain and sensation of fullness/distention since that time.  He reports only dribbling a small amount of urine in the afternoon on his multiple attempts at voiding.  Due to no satisfactory void and increasing pain, he presents to the ED for evaluation.  In triage, he was noted to have postvoid residual of nearly 1 L, indwelling Foley catheter was placed immediately draining greater than 1 L and he had resolution of symptoms.  He reports feeling much better now.  Past Medical History:  Diagnosis Date   Cancer [facility])    Prostate   Diabetes mellitus without complication ([place] )    Glaucoma    Gout    Hypercholesteremia    Hypertension    Osteoarthritis    hands   Prostate cancer [facility])     Patient Active Problem List   Diagnosis Date Noted   Aortic atherosclerosis ([place]) 12/05/2020   Controlled diabetes mellitus type 2 with complications ([place]) 94/17/4081   Advanced care planning/counseling discussion 10/15/2017   Prostate cancer ([place]) 07/14/2017   Angiodysplasia of intestine  with hemorrhage    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   Glaucoma 11/20/2015   Gout 11/20/2015   Osteoarthritis 03/21/2015   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   Hypertension 03/21/2015   Hyperlipidemia 03/21/2015    Past Surgical History:  Procedure Laterality Date   EYE SURGERY Bilateral    march and april 2021- [place]    FLEXIBLE SIGMOIDOSCOPY N/A 01/10/2016   Procedure: FLEXIBLE SIGMOIDOSCOPY with  argon plasma coagulation;  Surgeon: Lucilla Lame, MD;  Location: [place];  Service: Endoscopy;  Laterality: N/A;  Diabetic - oral meds   HERNIA REPAIR     JOINT REPLACEMENT  2014   Right Knee Replacement   MENISCUS REPAIR Left    nephrolithiasis     pt denies   PROSTATE SURGERY     Radiation treatments - no surgery   RADIOACTIVE SEED IMPLANT N/A 03/25/2021   Procedure: RADIOACTIVE SEED IMPLANT/BRACHYTHERAPY IMPLANT;  Surgeon: Hollice Espy, MD;  Location: [facility]S;  Service: Urology;  Laterality: N/A;   RETINAL DETACHMENT SURGERY Bilateral    One surgery in March 2021, and Second in April 2021   [place] Right 2012    Prior to Admission medications   Medication Sig Start Date End Date Taking? Authorizing Provider  allopurinol (ZYLOPRIM) 100 MG tablet Take 1 tablet (100 mg total) by mouth daily. 01/30/21   Johnson, [name] P, DO  amLODipine-benazepril (LOTREL) 5-20 MG capsule TAKE 2 CAPSULES ONCE EACH DAY Patient taking differently: Take 2 capsules by mouth daily. 02/12/21   Park Liter P, DO  aspirin EC 81 MG tablet Take 81 mg  by mouth daily.    [provider]  ciprofloxacin (CIPRO) 500 MG tablet Take 1 tablet (500 mg total) by mouth 2 (two) times daily. 03/25/21   Hollice Espy, MD  dapagliflozin propanediol (FARXIGA) 10 MG TABS tablet Take 1 tablet (10 mg total) by mouth daily. 01/30/21   Johnson, [name] P, DO  glucose blood test strip 1 each by Other route as needed for other. Use contour next test strip to check sugar 2 times daily  05/20/17   Kathrine Haddock, NP  lovastatin (MEVACOR) 40 MG tablet Take 1 tablet (40 mg total) by mouth daily. 01/30/21   Johnson, [name] P, DO  metFORMIN (GLUCOPHAGE-XR) 500 MG 24 hr tablet Take 2 tablets (1,000 mg total) by mouth 2 (two) times daily. 01/30/21   Johnson, [name] P, DO  Omega-3 Fatty Acids (FISH OIL PO) Take 1 capsule by mouth daily.    [provider]  pioglitazone (ACTOS) 45 MG tablet Take 1 tablet (45 mg total) by mouth daily. 01/30/21   Johnson, [name] P, DO  sildenafil (REVATIO) 20 MG tablet Take 1-5 tablets (20-100 mg total) by mouth as needed. Patient taking differently: Take 80-100 mg by mouth daily as needed (ED). 01/30/21   Park Liter P, DO  sitaGLIPtin (JANUVIA) 100 MG tablet Take 1 tablet (100 mg total) by mouth daily. 01/30/21   Johnson, [name] P, DO  tamsulosin (FLOMAX) 0.4 MG CAPS capsule Take 1 capsule (0.4 mg total) by mouth daily. 03/25/21   Hollice Espy, MD  timolol (TIMOPTIC) 0.5 % ophthalmic solution Place 1 drop into both eyes 2 (two) times daily. 01/07/21   [provider]    Allergies Patient has no known allergies.  Family History  Problem Relation Age of Onset   Cancer Father 61       lung   Emphysema Mother    Dementia Mother    Cancer Brother        melanoma    Social History Social History   Tobacco Use   Smoking status: Former    Pack years: 0.00    Types: Cigarettes    Quit date: 03/26/1975    Years since quitting: 46.0   Smokeless tobacco: Never  Vaping Use   Vaping Use: Never used  Substance Use Topics   Alcohol use: No    Alcohol/week: 0.0 standard drinks   Drug use: No    Review of Systems  Constitutional: No fever/chills Eyes: No visual changes. ENT: No sore throat. Cardiovascular: Denies chest pain. Respiratory: Denies shortness of breath. Gastrointestinal: Positive for suprapubic abdominal pain.  No nausea, no vomiting.  No diarrhea.  No constipation. Genitourinary: Negative for  dysuria. Musculoskeletal: Negative for back pain. Skin: Negative for rash. Neurological: Negative for headaches, focal weakness or numbness.  ____________________________________________   PHYSICAL EXAM:  VITAL SIGNS: Vitals:   03/25/21 2149 03/26/21 0135  BP: (!) 152/97 (!) 144/75  Pulse: (!) 106 85  Resp: 20 16  Temp: 97.9 F (36.6 C)   SpO2: 97% 99%     Constitutional: Alert and oriented. Well appearing and in no acute distress. Eyes: Conjunctivae are normal. PERRL. EOMI. Head: Atraumatic. Nose: No congestion/rhinnorhea. Mouth/Throat: Mucous membranes are moist.  Oropharynx non-erythematous. Neck: No stridor. No cervical spine tenderness to palpation. Cardiovascular: Normal rate, regular rhythm. Grossly normal heart sounds.  Good peripheral circulation. Respiratory: Normal respiratory effort.  No retractions. Lungs CTAB. Gastrointestinal: Soft , nondistended, nontender to palpation. No CVA tenderness. Indwelling Foley catheter draining dark red urine.  About  1700 cc has drained out by the time that I see the patient. Musculoskeletal: No lower extremity tenderness nor edema.  No joint effusions. No signs of acute trauma. Neurologic:  Normal speech and language. No gross focal neurologic deficits are appreciated. No gait instability noted. Skin:  Skin is warm, dry and intact. No rash noted. Psychiatric: Mood and affect are normal. Speech and behavior are normal.  ____________________________________________   LABS (all labs ordered are listed, but only abnormal results are displayed)  Labs Reviewed  URINALYSIS, COMPLETE (UACMP) WITH MICROSCOPIC - Abnormal; Notable for the following components:      Result Value   Color, Urine RED (*)    APPearance BLOODY (*)    Glucose, UA   (*)    Value: TEST NOT REPORTED DUE TO COLOR INTERFERENCE OF URINE PIGMENT   Hgb urine dipstick   (*)    Value: TEST NOT REPORTED DUE TO COLOR INTERFERENCE OF URINE PIGMENT   Bilirubin Urine    (*)    Value: TEST NOT REPORTED DUE TO COLOR INTERFERENCE OF URINE PIGMENT   Ketones, ur   (*)    Value: TEST NOT REPORTED DUE TO COLOR INTERFERENCE OF URINE PIGMENT   Protein, ur   (*)    Value: TEST NOT REPORTED DUE TO COLOR INTERFERENCE OF URINE PIGMENT   Nitrite   (*)    Value: TEST NOT REPORTED DUE TO COLOR INTERFERENCE OF URINE PIGMENT   Leukocytes,Ua   (*)    Value: TEST NOT REPORTED DUE TO COLOR INTERFERENCE OF URINE PIGMENT   RBC / HPF >50 (*)    All other components within normal limits   ____________________________________________  12 Lead EKG   ____________________________________________  RADIOLOGY  ED MD interpretation:    Official radiology report(s): DG OR UROLOGY CYSTO IMAGE ([facility] ONLY)  Result Date: 03/25/2021 There is no interpretation for this exam.  This order is for images obtained during a surgical procedure.  Please See "Surgeries" Tab for more information regarding the procedure.    ____________________________________________   PROCEDURES and INTERVENTIONS  Procedure(s) performed (including Critical Care):  Procedures  Medications - No data to display  ____________________________________________   MDM / ED COURSE   Pleasant 70 year old male with history of prostate cancer who recently had instrumentation and radioactive seed placement less than 24 hours ago presents to the ED with acute urinary retention requiring indwelling Foley catheter placement.  He had significant postvoid residual of nearly 1 L of urine, resolved with indwelling Foley, draining dark red gross hematuria.  His hematuria and retention are likely due to his recent procedure.  No fevers or evidence of systemic symptoms.  Benign exam after decompression of his bladder.  We will discharge with a leg bag and recommendations to follow-up with urology as an outpatient.  Return precautions for the ED were discussed.      ____________________________________________   FINAL  CLINICAL IMPRESSION(S) / ED DIAGNOSES  Final diagnoses:  Urinary retention  Gross hematuria  Prostate cancer [facility])     ED Discharge Orders     None        Aquilla Shambley   Note:  This document was prepared using Dragon voice recognition software and may include unintentional dictation errors.    Vladimir Crofts, MD 03/26/21 [phone]

## 2021-03-26 NOTE — Telephone Encounter (Signed)
VT Thursday please with PA  Hollice Espy, MD

## 2021-03-27 ENCOUNTER — Ambulatory Visit (INDEPENDENT_AMBULATORY_CARE_PROVIDER_SITE_OTHER): Payer: Medicare Other | Admitting: Physician Assistant

## 2021-03-27 ENCOUNTER — Encounter: Payer: Self-pay | Admitting: Family Medicine

## 2021-03-27 ENCOUNTER — Other Ambulatory Visit: Payer: Medicare Other

## 2021-03-27 ENCOUNTER — Other Ambulatory Visit: Payer: Self-pay

## 2021-03-27 DIAGNOSIS — T83091A Other mechanical complication of indwelling urethral catheter, initial encounter: Secondary | ICD-10-CM

## 2021-03-27 LAB — BLADDER SCAN AMB NON-IMAGING: Scan Result: >999 mL

## 2021-03-27 NOTE — Progress Notes (Signed)
Bladder Irrigation  Due to nondraining Foley + gross hematuria patient is present today for a bladder irrigation. Patient was cleaned and prepped in a sterile fashion. 240 ml of sterile water was instilled and irrigated into the bladder with a 70ml Toomey syringe through the catheter in place.  After irrigation urine flow was noted 1224SL; no complications were noted and catheter is now draining fine.  Catheter was reattached to the leg bag for drainage. Patient tolerated well.   Performed by: Debroah Loop, PA-C

## 2021-03-27 NOTE — Progress Notes (Signed)
03/27/2021 4:33 PM   Troy Christensen Jun 18, 1951 [id number]  CC: Chief Complaint  Patient presents with   Urinary Retention   HPI: Troy Christensen is a 70 y.o. 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 Erlene Quan 2 days ago who presents today with nondraining Foley catheter.  He was seen in the emergency department on POD 1 and urinary retention and was noted to have a bladder volume of approximately 1 L.  Foley catheter was placed at that time.  He is accompanied today by his wife, who contributes to HPI.  Today he reports his Foley catheter stopped draining around 4:30 this morning.  He notes burning bladder pain and distention as a result.  He noted small clots in his drainage bag prior to this.  Bladder scan with >974mL.  Additionally, he reports bothersome constipation since his procedure.  PMH: Past Medical History:  Diagnosis Date   Cancer [facility])    Prostate   Diabetes mellitus without complication ([place])    Glaucoma    Gout    Hypercholesteremia    Hypertension    Osteoarthritis    hands   Prostate cancer [facility])     Surgical History: Past Surgical History:  Procedure Laterality Date   EYE SURGERY Bilateral    march and april 2021- [place]    FLEXIBLE SIGMOIDOSCOPY N/A 01/10/2016   Procedure: FLEXIBLE SIGMOIDOSCOPY with  argon plasma coagulation;  Surgeon: Lucilla Lame, MD;  Location: [place];  Service: Endoscopy;  Laterality: N/A;  Diabetic - oral meds   HERNIA REPAIR     JOINT REPLACEMENT  2014   Right Knee Replacement   MENISCUS REPAIR Left    nephrolithiasis     pt denies   PROSTATE SURGERY     Radiation treatments - no surgery   RADIOACTIVE SEED IMPLANT N/A 03/25/2021   Procedure: RADIOACTIVE SEED IMPLANT/BRACHYTHERAPY IMPLANT;  Surgeon: Hollice Espy, MD;  Location: [facility];  Service: Urology;  Laterality: N/A;   RETINAL DETACHMENT SURGERY Bilateral    One surgery in  March 2021, and Second in April 2021   [name] Right 2012    Home Medications:  Allergies as of 03/27/2021   No Known Allergies      Medication List        Accurate as of March 27, 2021  4:33 PM. If you have any questions, ask your nurse or doctor.          allopurinol 100 MG tablet Commonly known as: ZYLOPRIM Take 1 tablet (100 mg total) by mouth daily.   amLODipine-benazepril 5-20 MG capsule Commonly known as: LOTREL TAKE 2 CAPSULES ONCE EACH DAY What changed:  how much to take how to take this when to take this additional instructions   aspirin EC 81 MG tablet Take 81 mg by mouth daily.   ciprofloxacin 500 MG tablet Commonly known as: Cipro Take 1 tablet (500 mg total) by mouth 2 (two) times daily.   dapagliflozin propanediol 10 MG Tabs tablet Commonly known as: Farxiga Take 1 tablet (10 mg total) by mouth daily.   FISH OIL PO Take 1 capsule by mouth daily.   glucose blood test strip 1 each by Other route as needed for other. Use contour next test strip to check sugar 2 times daily   lovastatin 40 MG tablet Commonly known as: MEVACOR Take 1 tablet (40 mg total) by mouth daily.   metFORMIN 500 MG 24 hr tablet  Commonly known as: GLUCOPHAGE-XR Take 2 tablets (1,000 mg total) by mouth 2 (two) times daily.   pioglitazone 45 MG tablet Commonly known as: ACTOS Take 1 tablet (45 mg total) by mouth daily.   sildenafil 20 MG tablet Commonly known as: REVATIO Take 1-5 tablets (20-100 mg total) by mouth as needed. What changed:  how much to take when to take this reasons to take this   sitaGLIPtin 100 MG tablet Commonly known as: Januvia Take 1 tablet (100 mg total) by mouth daily.   tamsulosin 0.4 MG Caps capsule Commonly known as: Flomax Take 1 capsule (0.4 mg total) by mouth daily.   timolol 0.5 % ophthalmic solution Commonly known as: TIMOPTIC Place 1 drop into both eyes 2 (two) times daily.        Allergies:  No Known  Allergies  Family History: Family History  Problem Relation Age of Onset   Cancer Father 73       lung   Emphysema Mother    Dementia Mother    Cancer Brother        melanoma    Social History:   reports that he quit smoking about 46 years ago. His smoking use included cigarettes. He has never used smokeless tobacco. He reports that he does not drink alcohol and does not use drugs.  Physical Exam: There were no vitals taken for this visit.  Constitutional:  Alert and oriented, no acute distress, nontoxic appearing HEENT: [place], AT Cardiovascular: No clubbing, cyanosis, or edema Respiratory: Normal respiratory effort, no increased work of breathing GU: Bruising of the bilateral scrotum extending into the perineum Skin: No rashes, bruises or suspicious lesions Neurologic: Grossly intact, no focal deficits, moving all 4 extremities Psychiatric: Normal mood and affect  Laboratory Data: Results for orders placed or performed in visit on 03/27/21  BLADDER SCAN AMB NON-IMAGING  Result Value Ref Range   Scan Result >999 mL   Assessment & Plan:   1. Obstruction of Foley catheter, initial encounter ([place]) Significantly elevated bladder volume in the setting of a nondraining Foley catheter and gross hematuria consistent with clot obstruction of the Foley.  I irrigated the patient's Foley catheter in clinic today, see separate procedure note for details.  Foley catheter irrigated easily and upon completion, urine was clear and tinged with old appearing blood.  I encouraged him to return to clinic if he has a recurrence of nondraining Foley, though I do not think this is likely to occur.  He expressed understanding.  Given significantly elevated bladder volume today as well as in the ED yesterday, I recommended deferring planned voiding trial to Monday to allow for bladder rest.  Patient is in agreement with this plan.  Return in about 5 days (around 04/01/2021) for Voiding trial.  Debroah Loop, PA-C  [facility] [address], [place],  [address] [phone]

## 2021-03-28 ENCOUNTER — Ambulatory Visit (INDEPENDENT_AMBULATORY_CARE_PROVIDER_SITE_OTHER): Payer: Medicare Other | Admitting: Physician Assistant

## 2021-03-28 ENCOUNTER — Ambulatory Visit: Payer: Self-pay | Admitting: Physician Assistant

## 2021-03-28 DIAGNOSIS — T83091D Other mechanical complication of indwelling urethral catheter, subsequent encounter: Secondary | ICD-10-CM

## 2021-03-28 NOTE — Progress Notes (Signed)
Bladder Irrigation  Due to catheter not draining through out the night patient is present today. Patient is in a great deal of pain and discomfort due to catheter not draining. His bladder does appear to be distended. Bladder scan was greater than 1066ml. Patient was cleaned and prepped in a sterile fashion. 60 ml of saline/sterile water was instilled and irrigated into the bladder with a 43ml Toomey syringe through the catheter in place.  A small blood clot was released with irrigation and urine flow was noted no complications were noted catheter is now draining fine.  1247ml of dark amber urine was drained, no other clots noted. Patient and his wife were given detailed instruction on catheter irrigation. Per Sam ok to irrigate at home if this happens again. Supplies were provided. Catheter was reattached to the leg bag for drainage. Patient tolerated well.   Preformed by: Fonnie Jarvis, CMA Additional notes/ Follow up: keep follow up as scheduled

## 2021-03-29 ENCOUNTER — Encounter: Payer: Self-pay | Admitting: Internal Medicine

## 2021-04-01 ENCOUNTER — Ambulatory Visit: Payer: Medicare Other | Admitting: Physician Assistant

## 2021-04-01 ENCOUNTER — Other Ambulatory Visit: Payer: Self-pay

## 2021-04-01 ENCOUNTER — Ambulatory Visit (INDEPENDENT_AMBULATORY_CARE_PROVIDER_SITE_OTHER): Payer: Medicare Other | Admitting: Physician Assistant

## 2021-04-01 DIAGNOSIS — R339 Retention of urine, unspecified: Secondary | ICD-10-CM | POA: Diagnosis not present

## 2021-04-01 LAB — BLADDER SCAN AMB NON-IMAGING

## 2021-04-01 NOTE — Progress Notes (Signed)
04/01/2021 9:38 AM   Troy Christensen 04/29/1951 [id number]  CC: Chief Complaint  Patient presents with   Other    VOIDING TRIAL     HPI: Troy Christensen is a 70 y.o. 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 Erlene Quan 7 days ago who developed postoperative urinary retention requiring Foley placement and clot retention requiring recurrent catheter irrigation who presents today for voiding trial.   Today he reports feeling well.  He states he has not had to irrigate his Foley catheter since being instructed on how to do so in clinic 4 days ago.  He has seen some small clots pass into his drainage bag, however his catheter continues to drain well.  He continues to take Flomax once daily.  Foley catheter removed in clinic this morning, see procedure note below for more details.    Patient returned to clinic this afternoon for repeat PVR. He reports drinking approximately 50oz of fluid. He has been able to urinate. PVR 38mL. He reports passing one clot about the size of a quarter but states his gross hematuria has since resolved.  PMH: Past Medical History:  Diagnosis Date   Cancer [facility])    Prostate   Diabetes mellitus without complication ([place])    Glaucoma    Gout    Hypercholesteremia    Hypertension    Osteoarthritis    hands   Prostate cancer [facility])     Surgical History: Past Surgical History:  Procedure Laterality Date   EYE SURGERY Bilateral    march and april 2021- [place]    FLEXIBLE SIGMOIDOSCOPY N/A 01/10/2016   Procedure: FLEXIBLE SIGMOIDOSCOPY with  argon plasma coagulation;  Surgeon: Lucilla Lame, MD;  Location: [place];  Service: Endoscopy;  Laterality: N/A;  Diabetic - oral meds   HERNIA REPAIR     JOINT REPLACEMENT  2014   Right Knee Replacement   MENISCUS REPAIR Left    nephrolithiasis     pt denies   PROSTATE SURGERY     Radiation treatments - no surgery    RADIOACTIVE SEED IMPLANT N/A 03/25/2021   Procedure: RADIOACTIVE SEED IMPLANT/BRACHYTHERAPY IMPLANT;  Surgeon: Hollice Espy, MD;  Location: [facility];  Service: Urology;  Laterality: N/A;   RETINAL DETACHMENT SURGERY Bilateral    One surgery in March 2021, and Second in April 2021   [place] Right 2012    Home Medications:  Allergies as of 04/01/2021   No Known Allergies      Medication List        Accurate as of April 01, 2021  9:38 AM. If you have any questions, ask your nurse or doctor.          allopurinol 100 MG tablet Commonly known as: ZYLOPRIM Take 1 tablet (100 mg total) by mouth daily.   amLODipine-benazepril 5-20 MG capsule Commonly known as: LOTREL TAKE 2 CAPSULES ONCE EACH DAY What changed:  how much to take how to take this when to take this additional instructions   aspirin EC 81 MG tablet Take 81 mg by mouth daily.   ciprofloxacin 500 MG tablet Commonly known as: Cipro Take 1 tablet (500 mg total) by mouth 2 (two) times daily.   dapagliflozin propanediol 10 MG Tabs tablet Commonly known as: Farxiga Take 1 tablet (10 mg total) by mouth daily.   FISH OIL PO Take 1 capsule by mouth daily.   glucose blood test strip 1 each by  Other route as needed for other. Use contour next test strip to check sugar 2 times daily   lovastatin 40 MG tablet Commonly known as: MEVACOR Take 1 tablet (40 mg total) by mouth daily.   metFORMIN 500 MG 24 hr tablet Commonly known as: GLUCOPHAGE-XR Take 2 tablets (1,000 mg total) by mouth 2 (two) times daily.   pioglitazone 45 MG tablet Commonly known as: ACTOS Take 1 tablet (45 mg total) by mouth daily.   sildenafil 20 MG tablet Commonly known as: REVATIO Take 1-5 tablets (20-100 mg total) by mouth as needed. What changed:  how much to take when to take this reasons to take this   sitaGLIPtin 100 MG tablet Commonly known as: Januvia Take 1 tablet (100 mg total) by mouth daily.   tamsulosin 0.4  MG Caps capsule Commonly known as: Flomax Take 1 capsule (0.4 mg total) by mouth daily.   timolol 0.5 % ophthalmic solution Commonly known as: TIMOPTIC Place 1 drop into both eyes 2 (two) times daily.        Allergies:  No Known Allergies  Family History: Family History  Problem Relation Age of Onset   Cancer Father 77       lung   Emphysema Mother    Dementia Mother    Cancer Brother        melanoma    Social History:   reports that he quit smoking about 46 years ago. His smoking use included cigarettes. He has never used smokeless tobacco. He reports that he does not drink alcohol and does not use drugs.  Physical Exam: There were no vitals taken for this visit.  Constitutional:  Alert and oriented, no acute distress, nontoxic appearing HEENT: [place], AT Cardiovascular: No clubbing, cyanosis, or edema Respiratory: Normal respiratory effort, no increased work of breathing GU: Red-brown urine with small clots draining into leg bag Skin: No rashes, bruises or suspicious lesions Neurologic: Grossly intact, no focal deficits, moving all 4 extremities Psychiatric: Normal mood and affect  Laboratory Data: Results for orders placed or performed in visit on 04/01/21  BLADDER SCAN AMB NON-IMAGING  Result Value Ref Range   Scan Result 8ml    Catheter Removal  Patient is present today for a catheter removal.  87ml of water was drained from the balloon. A 16FR coude foley cath was removed from the bladder no complications were noted . Patient tolerated well.  Performed by: Debroah Loop, PA-C   Follow up/ Additional notes: Push fluids and RTC this afternoon for PVR.   Assessment & Plan:   1. Urinary retention Voiding trial passed. Counseled patient to continue Flomax and resume normal fluid intake. May consider d/c Flomax at next appointment based on IPSS results.  Return if symptoms worsen or fail to improve.  Debroah Loop, PA-C  [facility] [address] [place], [place] [address] [phone]

## 2021-04-10 ENCOUNTER — Ambulatory Visit: Payer: Medicare Other | Admitting: Internal Medicine

## 2021-04-10 ENCOUNTER — Other Ambulatory Visit: Payer: Medicare Other

## 2021-04-18 ENCOUNTER — Other Ambulatory Visit: Payer: Self-pay | Admitting: Family Medicine

## 2021-04-18 DIAGNOSIS — E119 Type 2 diabetes mellitus without complications: Secondary | ICD-10-CM

## 2021-04-19 ENCOUNTER — Telehealth: Payer: Self-pay | Admitting: *Deleted

## 2021-04-19 DIAGNOSIS — E119 Type 2 diabetes mellitus without complications: Secondary | ICD-10-CM

## 2021-04-19 MED ORDER — PIOGLITAZONE HCL 45 MG PO TABS: 45.0000 mg | ORAL_TABLET | Freq: Every day | ORAL | 0 refills | Status: DC

## 2021-04-19 NOTE — Telephone Encounter (Signed)
Patient notified

## 2021-04-19 NOTE — Telephone Encounter (Signed)
Pt need a refill of his Piolglitazone a week supply  sent to [facility]. Script for entire  meds [facility] and Thx

## 2021-04-19 NOTE — Telephone Encounter (Signed)
[facility] for [facility]os [facility]. Rx sent to local pharmacy today

## 2021-04-22 ENCOUNTER — Encounter: Payer: Self-pay | Admitting: Internal Medicine

## 2021-04-22 ENCOUNTER — Other Ambulatory Visit: Payer: Self-pay

## 2021-04-22 ENCOUNTER — Ambulatory Visit (INDEPENDENT_AMBULATORY_CARE_PROVIDER_SITE_OTHER): Payer: Medicare Other | Admitting: Physician Assistant

## 2021-04-22 VITALS — BP 122/71 | HR 82 | Ht 70.0 in | Wt 201.0 lb

## 2021-04-22 DIAGNOSIS — C61 Malignant neoplasm of prostate: Secondary | ICD-10-CM

## 2021-04-22 DIAGNOSIS — R339 Retention of urine, unspecified: Secondary | ICD-10-CM

## 2021-04-22 LAB — BLADDER SCAN AMB NON-IMAGING

## 2021-04-22 NOTE — Progress Notes (Signed)
04/22/2021 11:11 AM   Troy Christensen 1951-04-11 [id number]  CC: Chief Complaint  Patient presents with   Prostate Cancer   Follow-up   Urinary Retention   HPI: Troy Christensen is a 70 y.o. 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 Erlene Quan on 03/25/2021 and who developed postoperative urinary retention requiring Foley placement and clot retention requiring catheter irrigation who presents today for symptom recheck after having passed a voiding trial in clinic with me 3 weeks ago.  Today he reports slight burning with initiation but is not terribly bothersome.  Overall, he feels he is emptying well with no acute concerns.  He does report an episode of urge incontinence within the past 2 weeks, but states this was low volume.  IPSS 8/mostly satisfied as below.  PVR 1 mL.   IPSS     Row Name 04/22/21 1100         International Prostate Symptom Score   How often have you had the sensation of not emptying your bladder? Less than 1 in 5     How often have you had to urinate less than every two hours? About half the time     How often have you found you stopped and started again several times when you urinated? Not at All     How often have you found it difficult to postpone urination? Not at All     How often have you had a weak urinary stream? Less than 1 in 5 times     How often have you had to strain to start urination? Not at All     How many times did you typically get up at night to urinate? 3 Times     Total IPSS Score 8           Quality of Life due to urinary symptoms     If you were to spend the rest of your life with your urinary condition just the way it is now how would you feel about that? Mostly Satisfied              PMH: Past Medical History:  Diagnosis Date   Cancer ([place])    Prostate   Diabetes mellitus without complication ([place])    Glaucoma    Gout    Hypercholesteremia     Hypertension    Osteoarthritis    hands   Prostate cancer [facility])     Surgical History: Past Surgical History:  Procedure Laterality Date   EYE SURGERY Bilateral    march and april 2021- [place]    FLEXIBLE SIGMOIDOSCOPY N/A 01/10/2016   Procedure: FLEXIBLE SIGMOIDOSCOPY with  argon plasma coagulation;  Surgeon: Lucilla Lame, MD;  Location: [place];  Service: Endoscopy;  Laterality: N/A;  Diabetic - oral meds   HERNIA REPAIR     JOINT REPLACEMENT  2014   Right Knee Replacement   MENISCUS REPAIR Left    nephrolithiasis     pt denies   PROSTATE SURGERY     Radiation treatments - no surgery   RADIOACTIVE SEED IMPLANT N/A 03/25/2021   Procedure: RADIOACTIVE SEED IMPLANT/BRACHYTHERAPY IMPLANT;  Surgeon: Hollice Espy, MD;  Location: [facility];  Service: Urology;  Laterality: N/A;   RETINAL DETACHMENT SURGERY Bilateral    One surgery in March 2021, and Second in April 2021   [place] Right 2012    Home Medications:  Allergies as of 04/22/2021  No Known Allergies      Medication List        Accurate as of April 22, 2021 11:11 AM. If you have any questions, ask your nurse or doctor.          allopurinol 100 MG tablet Commonly known as: ZYLOPRIM Take 1 tablet (100 mg total) by mouth daily.   amLODipine-benazepril 5-20 MG capsule Commonly known as: LOTREL TAKE 2 CAPSULES ONCE EACH DAY What changed:  how much to take how to take this when to take this additional instructions   aspirin EC 81 MG tablet Take 81 mg by mouth daily.   ciprofloxacin 500 MG tablet Commonly known as: Cipro Take 1 tablet (500 mg total) by mouth 2 (two) times daily.   dapagliflozin propanediol 10 MG Tabs tablet Commonly known as: Farxiga Take 1 tablet (10 mg total) by mouth daily.   FISH OIL PO Take 1 capsule by mouth daily.   glucose blood test strip 1 each by Other route as needed for other. Use contour next test strip to check sugar 2 times daily    lovastatin 40 MG tablet Commonly known as: MEVACOR Take 1 tablet (40 mg total) by mouth daily.   metFORMIN 500 MG 24 hr tablet Commonly known as: GLUCOPHAGE-XR Take 2 tablets (1,000 mg total) by mouth 2 (two) times daily.   pioglitazone 45 MG tablet Commonly known as: ACTOS Take 1 tablet (45 mg total) by mouth daily.   sildenafil 20 MG tablet Commonly known as: REVATIO Take 1-5 tablets (20-100 mg total) by mouth as needed. What changed:  how much to take when to take this reasons to take this   sitaGLIPtin 100 MG tablet Commonly known as: Januvia Take 1 tablet (100 mg total) by mouth daily.   tamsulosin 0.4 MG Caps capsule Commonly known as: Flomax Take 1 capsule (0.4 mg total) by mouth daily.   timolol 0.5 % ophthalmic solution Commonly known as: TIMOPTIC Place 1 drop into both eyes 2 (two) times daily.        Allergies:  No Known Allergies  Family History: Family History  Problem Relation Age of Onset   Cancer Father 17       lung   Emphysema Mother    Dementia Mother    Cancer Brother        melanoma    Social History:   reports that he quit smoking about 46 years ago. His smoking use included cigarettes. He has never used smokeless tobacco. He reports that he does not drink alcohol and does not use drugs.  Physical Exam: BP 122/71   Pulse 82   Ht '5\' 10"'$  (1.778 m)   Wt 201 lb (91.2 kg)   BMI 28.84 kg/m   Constitutional:  Alert and oriented, no acute distress, nontoxic appearing HEENT: [place], AT Cardiovascular: No clubbing, cyanosis, or edema Respiratory: Normal respiratory effort, no increased work of breathing Skin: No rashes, bruises or suspicious lesions Neurologic: Grossly intact, no focal deficits, moving all 4 extremities Psychiatric: Normal mood and affect  Laboratory Data: Results for orders placed or performed in visit on 04/22/21  Bladder Scan (Post Void Residual) in office  Result Value Ref Range   Scan Result 52m    Assessment &  Plan:   1. Urinary retention Resolved with no significant residual voiding symptoms 1 month following salvage brachytherapy.  He is having some storage related symptoms, however he reports minimal bother from these.  We discussed stopping Flomax once he completes  the last 3 tablets that he has on hand and I offered him low-dose Myrbetriq to assist with the voiding symptoms, however he declined this.  I suspect his slight burning with initiation is associated with bladder spasms, however I offered him UA today to rule out infection, and he prefers to decline this pending symptomatic worsening. - Bladder Scan (Post Void Residual) in office  2. Prostate cancer [facility]) We will plan for follow-up with Dr. Erlene Quan in 5 months with PSA prior. - PSA; Future   Return in about 5 months (around 09/22/2021) for Prostate cancer follow up with Dr. Erlene Quan with PSA prior.  Debroah Loop, PA-C  [facility] [address], [place], [place] [address] [phone]

## 2021-04-22 NOTE — Patient Instructions (Signed)
Finish the Flomax you have at home, then you may stop it. If you start to notice more difficulty urinating, weak stream, or straining to urinate off this medication, please call me and we can restart it.  If you start to have more bothersome urinary urgency, frequency, pain with urination, or nighttime urination, please let me know and we can start you on a medication for bladder overactivity.

## 2021-04-29 ENCOUNTER — Ambulatory Visit: Payer: Medicare Other | Admitting: Radiation Oncology

## 2021-04-29 ENCOUNTER — Ambulatory Visit: Payer: Medicare Other

## 2021-04-30 ENCOUNTER — Ambulatory Visit
Admission: RE | Admit: 2021-04-30 | Discharge: 2021-04-30 | Disposition: A | Discharge status: Home / Self Care | Payer: Medicare Other | Source: Ambulatory Visit | Attending: Radiation Oncology | Admitting: Radiation Oncology

## 2021-04-30 ENCOUNTER — Other Ambulatory Visit: Payer: Self-pay | Admitting: *Deleted

## 2021-04-30 ENCOUNTER — Encounter: Payer: Self-pay | Admitting: Radiation Oncology

## 2021-04-30 ENCOUNTER — Other Ambulatory Visit: Payer: Self-pay

## 2021-04-30 VITALS — BP 150/78 | HR 68 | Temp 95.8°F | Resp 18 | Wt 201.5 lb

## 2021-04-30 DIAGNOSIS — C61 Malignant neoplasm of prostate: Secondary | ICD-10-CM | POA: Insufficient documentation

## 2021-04-30 DIAGNOSIS — R351 Nocturia: Secondary | ICD-10-CM | POA: Insufficient documentation

## 2021-04-30 DIAGNOSIS — Z923 Personal history of irradiation: Secondary | ICD-10-CM | POA: Insufficient documentation

## 2021-04-30 DIAGNOSIS — Z510 Encounter for antineoplastic radiation therapy: Secondary | ICD-10-CM | POA: Diagnosis not present

## 2021-04-30 NOTE — Progress Notes (Signed)
Radiation Oncology Follow up Note  Name: Troy Christensen   Date:   04/30/2021 MRN:  [id number] DOB: 05/28/1951    This 70 y.o. male presents to the clinic today for 1 month follow-up status post I-125 interstitial implant for salvage patient treated 6 years prior with IMRT radiation therapy for stage IIb Gleason 8 (4+4 adenocarcinoma presenting with a PSA of 6.3.  REFERRING PROVIDER: Valerie Roys, DO  HPI: Patient is a 70 year old male now at 1 month having completed I-125 interstitial implant for salvage in patient treated over 6 years prior with IMRT radiation therapy for stage IIb Gleason 8 (4+4) adenocarcinoma.  He had hypermetabolic activity in the prostate with no evidence of bony metastatic disease or other areas of metastatic involvement.  He is seen today in routine follow-up doing fairly well does have some increased nocturia x2.  No significant diarrhea at this time. COMPLICATIONS OF TREATMENT: none  FOLLOW UP COMPLIANCE: keeps appointments   PHYSICAL EXAM:  BP (!) 150/78 (BP Location: Left Arm, Patient Position: Sitting)   Pulse 68   Temp (!) 95.8 F (35.4 C) (Tympanic)   Resp 18   Wt 201 lb 8 oz (91.4 kg)   BMI 28.91 kg/m  Well-developed well-nourished patient in NAD. HEENT reveals PERLA, EOMI, discs not visualized.  Oral cavity is clear. No oral mucosal lesions are identified. Neck is clear without evidence of cervical or supraclavicular adenopathy. Lungs are clear to A&P. Cardiac examination is essentially unremarkable with regular rate and rhythm without murmur rub or thrill. Abdomen is benign with no organomegaly or masses noted. Motor sensory and DTR levels are equal and symmetric in the upper and lower extremities. Cranial nerves II through XII are grossly intact. Proprioception is intact. No peripheral adenopathy or edema is identified. No motor or sensory levels are noted. Crude visual fields are within normal range.  RADIOLOGY RESULTS: CT scan for source  placement QA was performed showing excellent source placement  PLAN: Present time patient is doing fairly well.  I have assured him his symptoms some which are related to his prior therapy as well as currently being under treatment with radioactive seeds.  I have assured him some of the symptoms will decrease over time.  I have asked him to drink as much cranberry juice as possible.  I have asked to see him back in 3 months for follow-up with a PSA at that time.  Patient knows to call with any concerns.  I would like to take this opportunity to thank you for allowing me to participate in the care of your patient.Noreene Filbert, MD

## 2021-05-09 ENCOUNTER — Ambulatory Visit: Payer: TRICARE For Life (TFL) | Admitting: Family Medicine

## 2021-05-15 ENCOUNTER — Ambulatory Visit (INDEPENDENT_AMBULATORY_CARE_PROVIDER_SITE_OTHER): Payer: Medicare Other | Admitting: Family Medicine

## 2021-05-15 ENCOUNTER — Encounter: Payer: Self-pay | Admitting: Family Medicine

## 2021-05-15 ENCOUNTER — Other Ambulatory Visit: Payer: Self-pay

## 2021-05-15 VITALS — BP 128/79 | HR 70 | Temp 98.4°F | Ht 70.0 in | Wt 199.6 lb

## 2021-05-15 DIAGNOSIS — E118 Type 2 diabetes mellitus with unspecified complications: Secondary | ICD-10-CM | POA: Diagnosis not present

## 2021-05-15 LAB — BAYER DCA HB A1C WAIVED: HB A1C (BAYER DCA - WAIVED): 7.8 % — ABNORMAL HIGH (ref <7.0)

## 2021-05-15 MED ORDER — RYBELSUS 3 MG PO TABS: 3.0000 mg | ORAL_TABLET | Freq: Every day | ORAL | 0 refills | Status: DC

## 2021-05-15 NOTE — Progress Notes (Signed)
BP 128/79   Pulse 70   Temp 98.4 F (36.9 C) (Oral)   Ht '5\' 10"'$  (1.778 m)   Wt 199 lb 9.6 oz (90.5 kg)   SpO2 100%   BMI 28.64 kg/m    Subjective:    Patient ID: Troy Christensen, male    DOB: November 19, 1950, 70 y.o.   MRN: [id number]  HPI: DAYSHUN Christensen is a 70 y.o. male  Chief Complaint  Patient presents with   Diabetes   DIABETES Hypoglycemic episodes:no Polydipsia/polyuria: no Visual disturbance: no Chest pain: no Paresthesias: no Glucose Monitoring: yes  Accucheck frequency: Not Checking Taking Insulin?: no Blood Pressure Monitoring: not checking Retinal Examination: Up to Date Foot Exam: Up to Date Diabetic Education: Completed Pneumovax: Up to Date Influenza: Up to Date Aspirin: yes  Relevant past medical, surgical, family and social history reviewed and updated as indicated. Interim medical history since our last visit reviewed. Allergies and medications reviewed and updated.  Review of Systems  Constitutional: Negative.   Respiratory: Negative.    Cardiovascular: Negative.   Gastrointestinal: Negative.   Psychiatric/Behavioral: Negative.     Per HPI unless specifically indicated above     Objective:    BP 128/79   Pulse 70   Temp 98.4 F (36.9 C) (Oral)   Ht '5\' 10"'$  (1.778 m)   Wt 199 lb 9.6 oz (90.5 kg)   SpO2 100%   BMI 28.64 kg/m   Wt Readings from Last 3 Encounters:  05/15/21 199 lb 9.6 oz (90.5 kg)  04/30/21 201 lb 8 oz (91.4 kg)  04/22/21 201 lb (91.2 kg)    Physical Exam Vitals and nursing note reviewed.  Constitutional:      General: He is not in acute distress.    Appearance: Normal appearance. He is not ill-appearing, toxic-appearing or diaphoretic.  HENT:     Head: Normocephalic and atraumatic.     Right Ear: External ear normal.     Left Ear: External ear normal.     Nose: Nose normal.     Mouth/Throat:     Mouth: Mucous membranes are moist.     Pharynx: Oropharynx is clear.  Eyes:     General: No scleral icterus.        Right eye: No discharge.        Left eye: No discharge.     Extraocular Movements: Extraocular movements intact.     Conjunctiva/sclera: Conjunctivae normal.     Pupils: Pupils are equal, round, and reactive to light.  Cardiovascular:     Rate and Rhythm: Normal rate and regular rhythm.     Pulses: Normal pulses.     Heart sounds: Normal heart sounds. No murmur heard.   No friction rub. No gallop.  Pulmonary:     Effort: Pulmonary effort is normal. No respiratory distress.     Breath sounds: Normal breath sounds. No stridor. No wheezing, rhonchi or rales.  Chest:     Chest wall: No tenderness.  Musculoskeletal:        General: Normal range of motion.     Cervical back: Normal range of motion and neck supple.  Skin:    General: Skin is warm and dry.     Capillary Refill: Capillary refill takes less than 2 seconds.     Coloration: Skin is not jaundiced or pale.     Findings: No bruising, erythema, lesion or rash.  Neurological:     General: No focal deficit present.  Mental Status: He is alert and oriented to person, place, and time. Mental status is at baseline.  Psychiatric:        Mood and Affect: Mood normal.        Behavior: Behavior normal.        Thought Content: Thought content normal.        Judgment: Judgment normal.    Results for orders placed or performed in visit on 04/22/21  Bladder Scan (Post Void Residual) in office  Result Value Ref Range   Scan Result 11m       Assessment & Plan:   Problem List Items Addressed This Visit       Endocrine   Controlled diabetes mellitus type 2 with complications ([place] - Primary    Elevated at 7.8- up from 7.3- will stop jTongaand start rybelsus. Recheck 1 month to titrate to '7mg'$ . Call with any concerns.       Relevant Medications   Semaglutide (RYBELSUS) 3 MG TABS   Other Relevant Orders   Bayer DCA Hb A1c Waived     Follow up plan: Return in about 4 weeks (around 06/12/2021).

## 2021-05-15 NOTE — Assessment & Plan Note (Signed)
Elevated at 7.8- up from 7.3- will stop [place] and start rybelsus. Recheck 1 month to titrate to '7mg'$ . Call with any concerns.

## 2021-05-17 ENCOUNTER — Telehealth: Payer: Self-pay

## 2021-05-17 NOTE — Telephone Encounter (Signed)
Spoke with a representative from [employer] and notified that [facility] would like to cancel patient prescription for Januvia. [facility] representative verbalized understanding and has no further questions.

## 2021-05-17 NOTE — Telephone Encounter (Signed)
-----   Message from Valerie Roys, [place] sent at 05/15/2021  9:12 AM EDT ----- Please call [facility] and cancel [place]

## 2021-05-21 ENCOUNTER — Other Ambulatory Visit: Payer: Self-pay | Admitting: Family Medicine

## 2021-05-21 DIAGNOSIS — E119 Type 2 diabetes mellitus without complications: Secondary | ICD-10-CM

## 2021-05-22 NOTE — Telephone Encounter (Signed)
Requested Prescriptions  Pending Prescriptions Disp Refills  . metFORMIN (GLUCOPHAGE-XR) 500 MG 24 hr tablet [Pharmacy Med Name: METFORMIN HCL ER TABS 500MG] 360 tablet 3    Sig: TAKE 2 TABLETS TWICE A DAY     Endocrinology:  Diabetes - Biguanides Passed - 05/21/2021  1:50 PM      Passed - Cr in normal range and within 360 days    Creatinine, Ser  Date Value Ref Range Status  01/30/2021 1.20 0.76 - 1.27 mg/dL Final         Passed - HBA1C is between 0 and 7.9 and within 180 days    HB A1C (BAYER DCA - WAIVED)  Date Value Ref Range Status  05/15/2021 7.8 (H) <7.0 % Final    Comment:                                          Diabetic Adult            <7.0                                       Healthy Adult        4.3 - 5.7                                                           (DCCT/NGSP) American Diabetes Association's Summary of Glycemic Recommendations for Adults with Diabetes: Hemoglobin A1c <7.0%. More stringent glycemic goals (A1c <6.0%) may further reduce complications at the cost of increased risk of hypoglycemia.           **Effective May 20, 2021 Bayer DCA Hb A1c Waived**             reference interval will be changing to:                                                            4.8 - 5.6             Prediabetes: 5.7 - 6.4             Diabetes: >6.4             Glycemic control for adults with diabetes: <7.0          Passed - eGFR in normal range and within 360 days    GFR calc Af Amer  Date Value Ref Range Status  07/31/2020 80 >59 mL/min/1.73 Final    Comment:    **In accordance with recommendations from the NKF-ASN Task force,**   [facility] is in the process of updating its eGFR calculation to the   2021 CKD-EPI creatinine equation that estimates kidney function   without a race variable.    GFR calc non Af Amer  Date Value Ref Range Status  07/31/2020 69 >59 mL/min/1.73 Final   eGFR  Date Value Ref Range Status  01/30/2021 65 >59 mL/min/1.73 Final  Passed - Valid encounter within last 6 months    Recent Outpatient Visits          1 week ago Controlled type 2 diabetes mellitus with complication, without long-term current use of insulin ([place])   [place], [name] P, DO   3 months ago Primary hypertension   [facility] [place], [name] P, DO   9 months ago Controlled type 2 diabetes mellitus with complication, without long-term current use of insulin ([place])   [place], [name] P, DO   1 year ago Controlled type 2 diabetes mellitus with complication, without long-term current use of insulin ([place])   [place], [place], DO   1 year ago Essential hypertension   [place], Barb Merino, DO      Future Appointments            In 3 weeks Wynetta Emery, Barb Merino, DO [employer], [place]   In 3 months Ralene Bathe, MD [place]   In 4 months Hollice Espy, MD [place]

## 2021-06-07 ENCOUNTER — Ambulatory Visit (INDEPENDENT_AMBULATORY_CARE_PROVIDER_SITE_OTHER): Payer: Medicare Other

## 2021-06-07 DIAGNOSIS — Z0000 Encounter for general adult medical examination without abnormal findings: Secondary | ICD-10-CM

## 2021-06-07 NOTE — Progress Notes (Signed)
Subjective:   Troy Christensen is a 70 y.o. male who presents for Medicare Annual/Subsequent preventive examination.  I connected with  [name] on 06/07/21 by an audio only telemedicine application and verified that I am speaking with the correct person using two identifiers.   I discussed the limitations, risks, security and privacy concerns of performing an evaluation and management service by telephone and the availability of in person appointments. I also discussed with the patient that there may be a patient responsible charge related to this service. The patient expressed understanding and verbally consented to this telephonic visit.  Location of Patient: Home   Location of Provider: [place] Persons Participating in Visit: Troy Christensen (Patient), Irena Reichmann ([place])  List any persons and their role that are participating in the visit with the patient.    Review of Systems     Defer to Provider Cardiac Risk Factors include: none     Objective:    Today's Vitals   06/07/21 1624  PainSc: 0-No pain   There is no height or weight on file to calculate BMI.  Advanced Directives 06/07/2021 04/30/2021 03/25/2021 03/25/2021 03/15/2021 02/27/2021 01/02/2021  Does Patient Have a Medical Advance Directive? Yes No Yes Yes Yes No Yes  Type of Advance Directive - - Living will;Healthcare Power of [place];Living will [place];Living will - [place];Living will  Does patient want to make changes to medical advance directive? No - Patient declined - - No - Patient declined No - Patient declined - No - Patient declined  Copy of [place] in Chart? - - - No - copy requested (No Data) - No - copy requested  Would patient like information on creating a medical advance directive? - No - Patient declined - No - Patient declined No - Patient declined No - Patient declined No - Patient  declined    Current Medications (verified) Outpatient Encounter Medications as of 06/07/2021  Medication Sig   allopurinol (ZYLOPRIM) 100 MG tablet Take 1 tablet (100 mg total) by mouth daily.   amLODipine-benazepril (LOTREL) 5-20 MG capsule TAKE 2 CAPSULES ONCE EACH DAY (Patient taking differently: Take 2 capsules by mouth daily.)   aspirin EC 81 MG tablet Take 81 mg by mouth daily.   dapagliflozin propanediol (FARXIGA) 10 MG TABS tablet Take 1 tablet (10 mg total) by mouth daily.   glucose blood test strip 1 each by Other route as needed for other. Use contour next test strip to check sugar 2 times daily   lovastatin (MEVACOR) 40 MG tablet Take 1 tablet (40 mg total) by mouth daily.   metFORMIN (GLUCOPHAGE-XR) 500 MG 24 hr tablet TAKE 2 TABLETS TWICE A DAY   Omega-3 Fatty Acids (FISH OIL PO) Take 1 capsule by mouth daily.   pioglitazone (ACTOS) 45 MG tablet Take 1 tablet (45 mg total) by mouth daily.   Semaglutide (RYBELSUS) 3 MG TABS Take 3 mg by mouth daily.   sildenafil (REVATIO) 20 MG tablet Take 1-5 tablets (20-100 mg total) by mouth as needed. (Patient taking differently: Take 80-100 mg by mouth daily as needed (ED).)   timolol (TIMOPTIC) 0.5 % ophthalmic solution Place 1 drop into both eyes 2 (two) times daily.   Facility-Administered Encounter Medications as of 06/07/2021  Medication   ceFAZolin (ANCEF) 2 g in dextrose 5 % 100 mL IVPB    Allergies (verified) Patient has no known allergies.   History: Past  Medical History:  Diagnosis Date   Cancer [facility])    Prostate   Diabetes mellitus without complication ([place])    Glaucoma    Gout    Hypercholesteremia    Hypertension    Osteoarthritis    hands   Prostate cancer [facility])    Past Surgical History:  Procedure Laterality Date   EYE SURGERY Bilateral    march and april 2021- [place]    FLEXIBLE SIGMOIDOSCOPY N/A 01/10/2016   Procedure: FLEXIBLE SIGMOIDOSCOPY with  argon plasma coagulation;  Surgeon: Lucilla Lame, MD;   Location: [place];  Service: Endoscopy;  Laterality: N/A;  Diabetic - oral meds   HERNIA REPAIR     JOINT REPLACEMENT  2014   Right Knee Replacement   MENISCUS REPAIR Left    nephrolithiasis     pt denies   PROSTATE SURGERY     Radiation treatments - no surgery   RADIOACTIVE SEED IMPLANT N/A 03/25/2021   Procedure: RADIOACTIVE SEED IMPLANT/BRACHYTHERAPY IMPLANT;  Surgeon: Hollice Espy, MD;  Location: [facility];  Service: Urology;  Laterality: N/A;   RETINAL DETACHMENT SURGERY Bilateral    One surgery in March 2021, and Second in April 2021   [place] Right 2012   Family History  Problem Relation Age of Onset   Cancer Father 23       lung   Emphysema Mother    Dementia Mother    Cancer Brother        melanoma   Social History   Socioeconomic History   Marital status: Married    Spouse name: Not on file   Number of children: Not on file   Years of education: Not on file   Highest education level: Not on file  Occupational History   Occupation: retired  Tobacco Use   Smoking status: Former    Types: Cigarettes    Quit date: 03/26/1975    Years since quitting: 46.2   Smokeless tobacco: Never  Vaping Use   Vaping Use: Never used  Substance and Sexual Activity   Alcohol use: No    Alcohol/week: 0.0 standard drinks   Drug use: No   Sexual activity: Not Currently  Other Topics Concern   Not on file  Social History Narrative   Lives in [place]; with wife; 5 children. 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 Quit smoking 50 years; no alcohol.    Social Determinants of Health   Financial Resource Strain: Low Risk    Difficulty of Paying Living Expenses: Not hard at all  Food Insecurity: No Food Insecurity   Worried About Charity fundraiser in the Last Year: Never true   [place] in the Last Year: Never true  Transportation Needs: No Transportation Needs   Lack of Transportation (Medical): No   Lack of Transportation  (Non-Medical): No  Physical Activity: Sufficiently Active   Days of Exercise per Week: 5 days   Minutes of Exercise per Session: 30 min  Stress: No Stress Concern Present   Feeling of Stress : Not at all  Social Connections: Moderately Integrated   Frequency of Communication with Friends and Family: More than three times a week   Frequency of Social Gatherings with Friends and Family: Three times a week   Attends Religious Services: More than 4 times per year   Active Member of Clubs or Organizations: No   Attends Archivist Meetings: Never   Marital Status: Married    Tobacco Counseling  Counseling given: Not Answered   Clinical Intake:  Pre-visit preparation completed: Yes  Pain : No/denies pain Pain Score: 0-No pain     Nutritional Risks: None Diabetes: Yes CBG done?: No Did pt. bring in CBG monitor from home?: No  How often do you need to have someone help you when you read instructions, pamphlets, or other written materials from your doctor or pharmacy?: 1 - Never What is the last grade level you completed in school?: 12th  Diabetic? Yes  Interpreter Needed?: No      Activities of Daily Living In your present state of health, do you have any difficulty performing the following activities: 06/07/2021 03/15/2021  Hearing? N -  Vision? N -  Difficulty concentrating or making decisions? N -  Walking or climbing stairs? N N  Dressing or bathing? N -  Doing errands, shopping? N N  Preparing Food and eating ? N -  Using the Toilet? N -  In the past six months, have you accidently leaked urine? N -  Do you have problems with loss of bowel control? N -  Managing your Medications? N -  Managing your Finances? N -  Housekeeping or managing your Housekeeping? N -  Some recent data might be hidden    Patient Care Team: Valerie Roys, DO as PCP - General (Family Medicine)  Indicate any recent Medical Services you may have received from other than [facility]  providers in the past year (date may be approximate).     Assessment:   This is a routine wellness examination for [place].  Hearing/Vision screen No results found.  Dietary issues and exercise activities discussed: Current Exercise Habits: Home exercise routine, Exercise limited by: None identified   Goals Addressed   None   Depression Screen PHQ 2/9 Scores 06/07/2021 05/15/2021 01/30/2021 01/25/2020 10/25/2018 01/11/2018 10/15/2017  PHQ - 2 Score 0 0 0 0 0 0 0  PHQ- 9 Score - 0 - - - - -    Fall Risk Fall Risk  06/07/2021 05/15/2021 01/30/2021 01/25/2020 10/25/2018  Falls in the past year? 0 0 0 0 0  Number falls in past yr: 0 0 0 0 0  Injury with Fall? 0 0 0 0 -  Risk for fall due to : No Fall Risks No Fall Risks No Fall Risks - -  Follow up Falls evaluation completed Falls evaluation completed Falls evaluation completed - -    FALL RISK PREVENTION PERTAINING TO THE HOME:  Any stairs in or around the home? No  If so, are there any without handrails? No  Home free of loose throw rugs in walkways, pet beds, electrical cords, etc? Yes  Adequate lighting in your home to reduce risk of falls? Yes   ASSISTIVE DEVICES UTILIZED TO PREVENT FALLS:  Life alert? No  Use of a cane, walker or w/c? No  Grab bars in the bathroom? No  Shower chair or bench in shower? No  Elevated toilet seat or a handicapped toilet? No   TIMED UP AND GO:  Was the test performed? N/A.  Length of time to ambulate 10 feet: N/A sec.     Cognitive Function:     6CIT Screen 06/07/2021 10/25/2018 10/15/2017  What Year? 0 points 0 points 0 points  What month? 0 points 0 points 0 points  What time? 0 points 0 points 0 points  Count back from 20 0 points 0 points 0 points  Months in reverse 0 points 0 points 0 points  Repeat phrase 0 points 0 points 0 points  Total Score 0 0 0    Immunizations Immunization History  Administered Date(s) Administered   Fluad Quad(high Dose 65+) 06/09/2019, 06/21/2020    Influenza, High Dose Seasonal PF 07/21/2016, 07/17/2017, 06/21/2018   Influenza,inj,Quad PF,6+ Mos 06/26/2015   Influenza,inj,quad, With Preservative 06/15/2016   Influenza-Unspecified 05/17/2019   Moderna Sars-Covid-2 Vaccination 10/27/2019, 11/26/2019   Pneumococcal Conjugate-13 09/24/2016   Pneumococcal Polysaccharide-23 10/15/2017   Tdap 08/19/2011   Zoster, Live 10/05/2013    TDAP status: Up to date  Flu Vaccine status: Due, Education has been provided regarding the importance of this vaccine. Advised may receive this vaccine at local pharmacy or Health Dept. Aware to provide a copy of the vaccination record if obtained from local pharmacy or Health Dept. Verbalized acceptance and understanding.  Pneumococcal vaccine status: Up to date  Covid-19 vaccine status: Completed vaccines  Qualifies for Shingles Vaccine? Yes   Zostavax completed Yes   Shingrix Completed?: No.    Education has been provided regarding the importance of this vaccine. Patient has been advised to call insurance company to determine out of pocket expense if they have not yet received this vaccine. Advised may also receive vaccine at local pharmacy or Health Dept. Verbalized acceptance and understanding.  Screening Tests Health Maintenance  Topic Date Due   COVID-19 Vaccine (3 - Moderna risk series) 12/24/2019   INFLUENZA VACCINE  04/15/2021   Zoster Vaccines- Shingrix (1 of 2) 08/14/2021 (Originally 08/09/1970)   FOOT EXAM  05/15/2022 (Originally 09/29/2020)   OPHTHALMOLOGY EXAM  07/03/2021   TETANUS/TDAP  08/18/2021   HEMOGLOBIN A1C  11/12/2021   COLONOSCOPY (Pts 45-75yrs Insurance coverage will need to be confirmed)  12/20/2024   Hepatitis C Screening  Completed   HPV VACCINES  Aged Out    Health Maintenance  Health Maintenance Due  Topic Date Due   COVID-19 Vaccine (3 - Moderna risk series) 12/24/2019   INFLUENZA VACCINE  04/15/2021    Colorectal cancer screening: Type of screening: Colonoscopy.  Completed 12/21/2014. Repeat every 10 years  Lung Cancer Screening: (Low Dose CT Chest recommended if Age 59-80 years, 30 pack-year currently smoking OR have quit w/in 15years.) does not qualify.   Lung Cancer Screening Referral: NO  Additional Screening:  Hepatitis C Screening: does not qualify; Completed 10/15/2017  Vision Screening: Recommended annual ophthalmology exams for early detection of glaucoma and other disorders of the eye. Is the patient up to date with their annual eye exam?  No  Who is the provider or what is the name of the office in which the patient attends annual eye exams? [facility] If pt is not established with a provider, would they like to be referred to a provider to establish care? No .   Dental Screening: Recommended annual dental exams for proper oral hygiene  Community Resource Referral / Chronic Care Management: CRR required this visit?  No   CCM required this visit?  No      Plan:     I have personally reviewed and noted the following in the patient's chart:   Medical and social history Use of alcohol, tobacco or illicit drugs  Current medications and supplements including opioid prescriptions. Patient is not currently taking opioid prescriptions. Functional ability and status Nutritional status Physical activity Advanced directives List of other physicians Hospitalizations, surgeries, and ER visits in previous 12 months Vitals Screenings to include cognitive, depression, and falls Referrals and appointments  In addition, I have reviewed and  discussed with patient certain preventive protocols, quality metrics, and best practice recommendations. A written personalized care plan for preventive services as well as general preventive health recommendations were provided to patient.  I connected with  [name] on 06/07/21 by a video enabled telemedicine application and verified that I am speaking with the correct person using  two identifiers.   I discussed the limitations of evaluation and management by telemedicine. The patient expressed understanding and agreed to proceed.      Irena Reichmann, [facility]   06/07/2021   Nurse Notes: Non Face to Face 60 minutes

## 2021-06-10 ENCOUNTER — Other Ambulatory Visit: Payer: Self-pay | Admitting: Family Medicine

## 2021-06-10 DIAGNOSIS — M1A1790 Lead-induced chronic gout, unspecified ankle and foot, without tophus (tophi): Secondary | ICD-10-CM

## 2021-06-10 MED ORDER — ALLOPURINOL 100 MG PO TABS: 100.0000 mg | ORAL_TABLET | Freq: Every day | ORAL | 0 refills | Status: DC

## 2021-06-10 NOTE — Telephone Encounter (Signed)
Patient would like PCP to send in a 7 day supply to allopurinol (ZYLOPRIM) 100 MG tablet to hold him over until his mail order script is received. Patient states he is complexly out. Patient spoke with mail order pharmacy already and was advised to contact PCP for a short supply. Patient last seen 05/15/2021  [place], [place] Phone:  [phone]  Fax:  [phone]

## 2021-06-13 ENCOUNTER — Other Ambulatory Visit: Payer: Self-pay

## 2021-06-13 ENCOUNTER — Telehealth: Payer: Self-pay

## 2021-06-13 ENCOUNTER — Encounter: Payer: Self-pay | Admitting: Family Medicine

## 2021-06-13 ENCOUNTER — Ambulatory Visit (INDEPENDENT_AMBULATORY_CARE_PROVIDER_SITE_OTHER): Payer: Medicare Other | Admitting: Family Medicine

## 2021-06-13 VITALS — BP 134/77 | HR 65 | Ht 70.0 in | Wt 199.0 lb

## 2021-06-13 DIAGNOSIS — E118 Type 2 diabetes mellitus with unspecified complications: Secondary | ICD-10-CM | POA: Diagnosis not present

## 2021-06-13 DIAGNOSIS — I10 Essential (primary) hypertension: Secondary | ICD-10-CM | POA: Diagnosis not present

## 2021-06-13 DIAGNOSIS — Z23 Encounter for immunization: Secondary | ICD-10-CM | POA: Diagnosis not present

## 2021-06-13 MED ORDER — PIOGLITAZONE HCL 45 MG PO TABS: 45.0000 mg | ORAL_TABLET | Freq: Every day | ORAL | 1 refills | Status: DC

## 2021-06-13 MED ORDER — RYBELSUS 7 MG PO TABS: 7.0000 mg | ORAL_TABLET | Freq: Every day | ORAL | 3 refills | Status: DC

## 2021-06-13 MED ORDER — AMLODIPINE BESY-BENAZEPRIL HCL 5-20 MG PO CAPS: ORAL_CAPSULE | ORAL | 1 refills | Status: DC

## 2021-06-13 NOTE — Telephone Encounter (Signed)
PA submitted and approved for Rybelsus. Key: [id number]   Called and notified patient of approval.

## 2021-06-13 NOTE — Assessment & Plan Note (Signed)
Tolerating his rybelsus well. Will increase to 7mg  and recheck in 2 months. Goal of taking him off actos and titrating up. Continue to monitor.

## 2021-06-13 NOTE — Progress Notes (Signed)
BP 134/77   Pulse 65   Ht 5\' 10"  (1.778 m)   Wt 199 lb (90.3 kg)   BMI 28.55 kg/m    Subjective:    Patient ID: Troy Christensen, male    DOB: [birth year], 70 y.o.   MRN: [id number]  HPI: Troy Christensen is a 70 y.o. male  Chief Complaint  Patient presents with   Diabetes   DIABETES Hypoglycemic episodes:no Polydipsia/polyuria: no Visual disturbance: no Chest pain: no Paresthesias: no Glucose Monitoring: yes  Accucheck frequency: Daily, 150s Taking Insulin?: no Blood Pressure Monitoring: not checking Retinal Examination: Up to Date Foot Exam: Up to Date Diabetic Education: Completed Pneumovax: Up to Date Influenza: Up to Date Aspirin: yes  Relevant past medical, surgical, family and social history reviewed and updated as indicated. Interim medical history since our last visit reviewed. Allergies and medications reviewed and updated.  Review of Systems  Constitutional: Negative.   Respiratory: Negative.    Cardiovascular: Negative.   Gastrointestinal: Negative.   Musculoskeletal: Negative.   Psychiatric/Behavioral: Negative.     Per HPI unless specifically indicated above     Objective:    BP 134/77   Pulse 65   Ht 5\' 10"  (1.778 m)   Wt 199 lb (90.3 kg)   BMI 28.55 kg/m   Wt Readings from Last 3 Encounters:  06/13/21 199 lb (90.3 kg)  05/15/21 199 lb 9.6 oz (90.5 kg)  04/30/21 201 lb 8 oz (91.4 kg)    Physical Exam Vitals and nursing note reviewed.  Constitutional:      General: He is not in acute distress.    Appearance: Normal appearance. He is not ill-appearing, toxic-appearing or diaphoretic.  HENT:     Head: Normocephalic and atraumatic.     Right Ear: External ear normal.     Left Ear: External ear normal.     Nose: Nose normal.     Mouth/Throat:     Mouth: Mucous membranes are moist.     Pharynx: Oropharynx is clear.  Eyes:     General: No scleral icterus.       Right eye: No discharge.        Left eye: No discharge.      Extraocular Movements: Extraocular movements intact.     Conjunctiva/sclera: Conjunctivae normal.     Pupils: Pupils are equal, round, and reactive to light.  Cardiovascular:     Rate and Rhythm: Normal rate and regular rhythm.     Pulses: Normal pulses.     Heart sounds: Normal heart sounds. No murmur heard.   No friction rub. No gallop.  Pulmonary:     Effort: Pulmonary effort is normal. No respiratory distress.     Breath sounds: Normal breath sounds. No stridor. No wheezing, rhonchi or rales.  Chest:     Chest wall: No tenderness.  Musculoskeletal:        General: Normal range of motion.     Cervical back: Normal range of motion and neck supple.  Skin:    General: Skin is warm and dry.     Capillary Refill: Capillary refill takes less than 2 seconds.     Coloration: Skin is not jaundiced or pale.     Findings: No bruising, erythema, lesion or rash.  Neurological:     General: No focal deficit present.     Mental Status: He is alert and oriented to person, place, and time. Mental status is at baseline.  Psychiatric:  Mood and Affect: Mood normal.        Behavior: Behavior normal.        Thought Content: Thought content normal.        Judgment: Judgment normal.    Results for orders placed or performed in visit on 05/15/21  Bayer DCA Hb A1c Waived  Result Value Ref Range   HB A1C (BAYER DCA - WAIVED) 7.8 (H) <7.0 %      Assessment & Plan:   Problem List Items Addressed This Visit       Other   Hyperlipidemia   Relevant Medications   amLODipine-benazepril (LOTREL) 5-20 MG capsule   Other Visit Diagnoses     Essential hypertension       Relevant Medications   amLODipine-benazepril (LOTREL) 5-20 MG capsule   Type 2 diabetes mellitus without complication, without long-term current use of insulin (HCC)       Relevant Medications   Semaglutide (RYBELSUS) 7 MG TABS   amLODipine-benazepril (LOTREL) 5-20 MG capsule   pioglitazone (ACTOS) 45 MG tablet         Follow up plan: No follow-ups on file.

## 2021-07-03 ENCOUNTER — Ambulatory Visit: Payer: Medicare Other | Admitting: Radiation Oncology

## 2021-07-19 ENCOUNTER — Encounter: Payer: Self-pay | Admitting: Family Medicine

## 2021-07-19 ENCOUNTER — Ambulatory Visit (INDEPENDENT_AMBULATORY_CARE_PROVIDER_SITE_OTHER): Payer: Medicare Other | Admitting: Family Medicine

## 2021-07-19 ENCOUNTER — Other Ambulatory Visit: Payer: Self-pay

## 2021-07-19 VITALS — BP 124/73 | HR 64 | Wt 202.6 lb

## 2021-07-19 DIAGNOSIS — M7522 Bicipital tendinitis, left shoulder: Secondary | ICD-10-CM

## 2021-07-19 NOTE — Progress Notes (Signed)
BP 124/73   Pulse 64   Wt 202 lb 9.6 oz (91.9 kg)   SpO2 97%   BMI 29.07 kg/m    Subjective:    Patient ID: Troy Christensen, male    DOB: [birth year], 70 y.o.   MRN: [id number]  HPI: Troy Christensen is a 70 y.o. male  Chief Complaint  Patient presents with   Arm Pain    Patient states his left upper arm has been hurting for a few weeks    ARM PAIN Duration:  chronic about 2 weeks worse Location: left upper arm Mechanism of injury: unknown Onset: gradual Severity: moderate  Quality:  sore and aching Frequency: constant Radiation: no Aggravating factors: picking things up  Alleviating factors: lifting things  Status: worse Treatments attempted: rest, ice, and heat  Relief with NSAIDs?:  mild Swelling: no Redness: no  Warmth: no Trauma: no Chest pain: no  Shortness of breath: no  Fever: no Decreased sensation: no Paresthesias: no Weakness: no  Relevant past medical, surgical, family and social history reviewed and updated as indicated. Interim medical history since our last visit reviewed. Allergies and medications reviewed and updated.  Review of Systems  Constitutional: Negative.   Respiratory: Negative.    Cardiovascular: Negative.   Gastrointestinal: Negative.   Musculoskeletal: Negative.   Neurological: Negative.   Psychiatric/Behavioral: Negative.     Per HPI unless specifically indicated above     Objective:    BP 124/73   Pulse 64   Wt 202 lb 9.6 oz (91.9 kg)   SpO2 97%   BMI 29.07 kg/m   Wt Readings from Last 3 Encounters:  07/19/21 202 lb 9.6 oz (91.9 kg)  06/13/21 199 lb (90.3 kg)  05/15/21 199 lb 9.6 oz (90.5 kg)    Physical Exam Vitals and nursing note reviewed.  Constitutional:      General: He is not in acute distress.    Appearance: Normal appearance. He is not ill-appearing, toxic-appearing or diaphoretic.  HENT:     Head: Normocephalic and atraumatic.     Right Ear: External ear normal.     Left Ear: External ear  normal.     Nose: Nose normal.     Mouth/Throat:     Mouth: Mucous membranes are moist.     Pharynx: Oropharynx is clear.  Eyes:     General: No scleral icterus.       Right eye: No discharge.        Left eye: No discharge.     Extraocular Movements: Extraocular movements intact.     Conjunctiva/sclera: Conjunctivae normal.     Pupils: Pupils are equal, round, and reactive to light.  Cardiovascular:     Rate and Rhythm: Normal rate and regular rhythm.     Pulses: Normal pulses.     Heart sounds: Normal heart sounds. No murmur heard.   No friction rub. No gallop.  Pulmonary:     Effort: Pulmonary effort is normal. No respiratory distress.     Breath sounds: Normal breath sounds. No stridor. No wheezing, rhonchi or rales.  Chest:     Chest wall: No tenderness.  Musculoskeletal:        General: Tenderness (tenderness over L biceps tendon) present.     Cervical back: Normal range of motion and neck supple.  Skin:    General: Skin is warm and dry.     Capillary Refill: Capillary refill takes less than 2 seconds.     Coloration: Skin is  not jaundiced or pale.     Findings: No bruising, erythema, lesion or rash.  Neurological:     General: No focal deficit present.     Mental Status: He is alert and oriented to person, place, and time. Mental status is at baseline.  Psychiatric:        Mood and Affect: Mood normal.        Behavior: Behavior normal.        Thought Content: Thought content normal.        Judgment: Judgment normal.    Results for orders placed or performed in visit on 05/15/21  Bayer DCA Hb A1c Waived  Result Value Ref Range   HB A1C (BAYER DCA - WAIVED) 7.8 (H) <7.0 %      Assessment & Plan:   Problem List Items Addressed This Visit   None Visit Diagnoses     Biceps tendinitis of left upper extremity    -  Primary   Will treat with voltaren and exercises. Call if not getting better or getting worse. Recheck 1 month.         Follow up plan: Return in  about 4 weeks (around 08/16/2021).

## 2021-07-25 DIAGNOSIS — Z961 Presence of intraocular lens: Secondary | ICD-10-CM | POA: Diagnosis not present

## 2021-07-25 DIAGNOSIS — H35372 Puckering of macula, left eye: Secondary | ICD-10-CM | POA: Diagnosis not present

## 2021-07-25 DIAGNOSIS — D23122 Other benign neoplasm of skin of left lower eyelid, including canthus: Secondary | ICD-10-CM | POA: Diagnosis not present

## 2021-07-25 DIAGNOSIS — H31092 Other chorioretinal scars, left eye: Secondary | ICD-10-CM | POA: Diagnosis not present

## 2021-07-25 DIAGNOSIS — E113212 Type 2 diabetes mellitus with mild nonproliferative diabetic retinopathy with macular edema, left eye: Secondary | ICD-10-CM | POA: Diagnosis not present

## 2021-07-25 LAB — HM DIABETES EYE EXAM

## 2021-07-26 ENCOUNTER — Other Ambulatory Visit: Payer: Self-pay | Admitting: Family Medicine

## 2021-07-26 MED ORDER — DAPAGLIFLOZIN PROPANEDIOL 10 MG PO TABS: 10.0000 mg | ORAL_TABLET | Freq: Every day | ORAL | 0 refills | Status: DC

## 2021-07-26 NOTE — Telephone Encounter (Signed)
Routing to provider. RX pended.

## 2021-07-26 NOTE — Telephone Encounter (Signed)
Pt called and is requesting to have an emergency supply of Farxiga sent to the pharmacy to hold him until he is able to get his supply from mail order pharmacy. Pt stating that he is completely out of medication. Please advise .     [facility] - [place], [place] - [address]  [place] [address]  Phone: [phone] Fax: [phone]  Hours: Not open 24 hours

## 2021-07-26 NOTE — Telephone Encounter (Signed)
Patient notified that RX was sent in.

## 2021-07-29 ENCOUNTER — Other Ambulatory Visit: Payer: Self-pay

## 2021-07-29 ENCOUNTER — Inpatient Hospital Stay: Payer: Medicare Other | Attending: Radiation Oncology

## 2021-07-29 DIAGNOSIS — C61 Malignant neoplasm of prostate: Secondary | ICD-10-CM | POA: Insufficient documentation

## 2021-07-29 LAB — PSA: Prostatic Specific Antigen: 0.07 ng/mL (ref 0.00–4.00)

## 2021-07-30 DIAGNOSIS — E113312 Type 2 diabetes mellitus with moderate nonproliferative diabetic retinopathy with macular edema, left eye: Secondary | ICD-10-CM | POA: Diagnosis not present

## 2021-07-30 DIAGNOSIS — H35033 Hypertensive retinopathy, bilateral: Secondary | ICD-10-CM | POA: Diagnosis not present

## 2021-07-30 DIAGNOSIS — H35372 Puckering of macula, left eye: Secondary | ICD-10-CM | POA: Diagnosis not present

## 2021-07-30 DIAGNOSIS — H43811 Vitreous degeneration, right eye: Secondary | ICD-10-CM | POA: Diagnosis not present

## 2021-08-02 ENCOUNTER — Encounter: Payer: Self-pay | Admitting: Radiation Oncology

## 2021-08-02 ENCOUNTER — Ambulatory Visit
Admission: RE | Admit: 2021-08-02 | Discharge: 2021-08-02 | Disposition: A | Discharge status: Home / Self Care | Payer: Medicare Other | Source: Ambulatory Visit | Attending: Radiation Oncology | Admitting: Radiation Oncology

## 2021-08-02 ENCOUNTER — Other Ambulatory Visit: Payer: Self-pay | Admitting: *Deleted

## 2021-08-02 ENCOUNTER — Other Ambulatory Visit: Payer: Self-pay

## 2021-08-02 VITALS — BP 122/66 | HR 76 | Temp 97.5°F | Resp 16 | Wt 197.5 lb

## 2021-08-02 DIAGNOSIS — R350 Frequency of micturition: Secondary | ICD-10-CM | POA: Diagnosis not present

## 2021-08-02 DIAGNOSIS — Z08 Encounter for follow-up examination after completed treatment for malignant neoplasm: Secondary | ICD-10-CM | POA: Diagnosis not present

## 2021-08-02 DIAGNOSIS — Z923 Personal history of irradiation: Secondary | ICD-10-CM | POA: Diagnosis not present

## 2021-08-02 DIAGNOSIS — C61 Malignant neoplasm of prostate: Secondary | ICD-10-CM | POA: Diagnosis not present

## 2021-08-02 MED ORDER — TAMSULOSIN HCL 0.4 MG PO CAPS: 0.4000 mg | ORAL_CAPSULE | Freq: Every day | ORAL | 6 refills | Status: DC

## 2021-08-02 NOTE — Progress Notes (Signed)
Radiation Oncology Follow up Note  Name: Troy Christensen   Date:   08/02/2021 MRN:  [id number] DOB: Jan 12, 1951    This 70 y.o. male presents to the clinic today for 54-month follow-up status post I-125 interstitial implant for salvage and patient treated 6 years prior with IMRT radiation therapy for stage IIb Gleason 8 (4+4) adenocarcinoma presenting with a PSA of 6.3.  REFERRING PROVIDER: Valerie Roys, DO  HPI: Patient is a 70 year old male now out over 3 months having completed salvage I-125 interstitial implant and patient treated over 6 years prior with IMRT radiation therapy for stage IIb Gleason 8 adenocarcinoma seen today in routine follow-up he continues to have some urgency and frequency of urination.  He is passing water approximately 1 to 2 hours every day.  It seems to be better at night.  He is not on any medication at this time.  He also specifically denies any diarrhea..  His PSA continues to decline was 0.074 days ago down from 2.037 months ago.  COMPLICATIONS OF TREATMENT: none  FOLLOW UP COMPLIANCE: keeps appointments   PHYSICAL EXAM:  BP 122/66 (BP Location: Left Arm, Patient Position: Sitting)   Pulse 76   Temp (!) 97.5 F (36.4 C) (Tympanic)   Resp 16   Wt 197 lb 8 oz (89.6 kg)   BMI 28.34 kg/m  Well-developed well-nourished patient in NAD. HEENT reveals PERLA, EOMI, discs not visualized.  Oral cavity is clear. No oral mucosal lesions are identified. Neck is clear without evidence of cervical or supraclavicular adenopathy. Lungs are clear to A&P. Cardiac examination is essentially unremarkable with regular rate and rhythm without murmur rub or thrill. Abdomen is benign with no organomegaly or masses noted. Motor sensory and DTR levels are equal and symmetric in the upper and lower extremities. Cranial nerves II through XII are grossly intact. Proprioception is intact. No peripheral adenopathy or edema is identified. No motor or sensory levels are noted. Crude  visual fields are within normal range.  RADIOLOGY RESULTS: No current films for review  PLAN: Present time patient has excellent biochemical control of his prostate cancer.  And pleased with his overall progress.  I am starting him on Flomax I believe he is on it in the past.  He is also has a follow-up appointment with urology in the next month and will address some of these urinary symptoms.  I have otherwise asked to see him back in 6 months for follow-up with a PSA.  Patient knows to call with any concerns.  I would like to take this opportunity to thank you for allowing me to participate in the care of your patient.Noreene Filbert, MD

## 2021-08-09 DIAGNOSIS — Z20828 Contact with and (suspected) exposure to other viral communicable diseases: Secondary | ICD-10-CM | POA: Diagnosis not present

## 2021-08-13 ENCOUNTER — Ambulatory Visit (INDEPENDENT_AMBULATORY_CARE_PROVIDER_SITE_OTHER): Payer: Medicare Other | Admitting: Family Medicine

## 2021-08-13 ENCOUNTER — Encounter: Payer: Self-pay | Admitting: Family Medicine

## 2021-08-13 ENCOUNTER — Telehealth: Payer: Self-pay | Admitting: *Deleted

## 2021-08-13 ENCOUNTER — Other Ambulatory Visit: Payer: Self-pay

## 2021-08-13 VITALS — BP 126/74 | HR 60 | Temp 98.1°F | Ht 69.5 in | Wt 197.2 lb

## 2021-08-13 DIAGNOSIS — Z23 Encounter for immunization: Secondary | ICD-10-CM | POA: Diagnosis not present

## 2021-08-13 DIAGNOSIS — S61402A Unspecified open wound of left hand, initial encounter: Secondary | ICD-10-CM | POA: Diagnosis not present

## 2021-08-13 DIAGNOSIS — I10 Essential (primary) hypertension: Secondary | ICD-10-CM

## 2021-08-13 DIAGNOSIS — I700 Atherosclerosis of aorta: Secondary | ICD-10-CM

## 2021-08-13 DIAGNOSIS — E7800 Pure hypercholesterolemia, unspecified: Secondary | ICD-10-CM | POA: Diagnosis not present

## 2021-08-13 DIAGNOSIS — E118 Type 2 diabetes mellitus with unspecified complications: Secondary | ICD-10-CM

## 2021-08-13 DIAGNOSIS — M1A1790 Lead-induced chronic gout, unspecified ankle and foot, without tophus (tophi): Secondary | ICD-10-CM | POA: Diagnosis not present

## 2021-08-13 DIAGNOSIS — E785 Hyperlipidemia, unspecified: Secondary | ICD-10-CM

## 2021-08-13 DIAGNOSIS — T560X1D Toxic effect of lead and its compounds, accidental (unintentional), subsequent encounter: Secondary | ICD-10-CM

## 2021-08-13 LAB — MICROALBUMIN, URINE WAIVED
Creatinine, Urine Waived: 50 mg/dL (ref 10–300)
Microalb, Ur Waived: 30 mg/L — ABNORMAL HIGH (ref 0–19)

## 2021-08-13 LAB — BAYER DCA HB A1C WAIVED: HB A1C (BAYER DCA - WAIVED): 7.9 % — ABNORMAL HIGH (ref 4.8–5.6)

## 2021-08-13 MED ORDER — METFORMIN HCL ER 500 MG PO TB24: 1000.0000 mg | ORAL_TABLET | Freq: Two times a day (BID) | ORAL | 1 refills | Status: DC

## 2021-08-13 MED ORDER — RYBELSUS 14 MG PO TABS: 14.0000 mg | ORAL_TABLET | Freq: Every day | ORAL | 1 refills | Status: DC

## 2021-08-13 MED ORDER — LOVASTATIN 40 MG PO TABS
40.0000 mg | ORAL_TABLET | Freq: Every day | ORAL | 1 refills | Status: DC
Start: 2021-08-13 — End: 2022-02-11

## 2021-08-13 MED ORDER — DAPAGLIFLOZIN PROPANEDIOL 10 MG PO TABS: 10.0000 mg | ORAL_TABLET | Freq: Every day | ORAL | 1 refills | Status: DC

## 2021-08-13 MED ORDER — AMLODIPINE BESY-BENAZEPRIL HCL 5-20 MG PO CAPS: ORAL_CAPSULE | ORAL | 1 refills | Status: DC

## 2021-08-13 MED ORDER — ALLOPURINOL 100 MG PO TABS: 100.0000 mg | ORAL_TABLET | Freq: Every day | ORAL | 1 refills | Status: DC

## 2021-08-13 MED ORDER — PIOGLITAZONE HCL 45 MG PO TABS: 45.0000 mg | ORAL_TABLET | Freq: Every day | ORAL | 1 refills | Status: DC

## 2021-08-13 NOTE — Assessment & Plan Note (Addendum)
Doing stable with A1c of 7.9. Increase rybelsus to 14mg  and continue actos for now. Recheck 3 months.

## 2021-08-13 NOTE — Telephone Encounter (Signed)
-----   Message from Gordy Clement, [place] sent at 08/02/2021  1:54 PM EST ----- Regarding: FW: PSA schedule Ok to cancel? Please advise. Thanks!  -OG   ----- Message ----- From: Daiva Huge, RN Sent: 08/02/2021   9:37 AM EST To: Gordy Clement, CMA Subject: PSA schedule                                   He just had a PSA drawn with [place], Dr. Baruch Gouty doesn't think he needs his PSA drawn in January.   If you don't mind cancelling that.  We are checking it again in May.   Thanks,   [facility]

## 2021-08-13 NOTE — Telephone Encounter (Addendum)
Canceled patient aware   ----- Message from Hollice Espy, MD sent at 08/05/2021  8:46 AM EST ----- Regarding: RE: PSA schedule OK to cancel  ----- Message ----- From: Gordy Clement, [place]: 08/02/2021   1:54 PM EST To: Shanon Ace, CMA, Hollice Espy, MD Subject: FW: PSA schedule                               Ok to cancel? Please advise. Thanks!  -OG   ----- Message ----- From: Daiva Huge, RN Sent: 08/02/2021   9:37 AM EST To: Gordy Clement, CMA Subject: PSA schedule                                   He just had a PSA drawn with [place], Dr. Baruch Gouty doesn't think he needs his PSA drawn in January.   If you don't mind cancelling that.  We are checking it again in May.   Thanks,   [facility]

## 2021-08-13 NOTE — Progress Notes (Signed)
BP 126/74   Pulse 60   Temp 98.1 F (36.7 C)   Ht 5' 9.5" (1.765 m)   Wt 197 lb 3.2 oz (89.4 kg)   SpO2 98%   BMI 28.70 kg/m    Subjective:    Patient ID: Troy Christensen, male    DOB: Jan 27, 1951, 70 y.o.   MRN: [id number]  HPI: Troy Christensen is a 70 y.o. male  Chief Complaint  Patient presents with   Diabetes   DIABETES Hypoglycemic episodes:no Polydipsia/polyuria: no Visual disturbance: no Chest pain: no Paresthesias: no Glucose Monitoring: yes  Accucheck frequency: Daily  Fasting glucose: 120s-130s Taking Insulin?: no Blood Pressure Monitoring: not checking Retinal Examination: Up to Date Foot Exam: Up to Date Diabetic Education: Completed Pneumovax: Up to Date Influenza: Up to Date Aspirin: yes  HYPERTENSION / HYPERLIPIDEMIA Satisfied with current treatment? yes Duration of hypertension: chronic BP monitoring frequency: not checking BP medication side effects: no Past BP meds: amlodipine, benazepril Duration of hyperlipidemia: chronic Cholesterol medication side effects: no Cholesterol supplements: none Past cholesterol medications: lovastatin Medication compliance: excellent compliance Aspirin: yes Recent stressors: no Recurrent headaches: no Visual changes: no Palpitations: no Dyspnea: no Chest pain: no Lower extremity edema: no Dizzy/lightheaded: no  No gout flares. Tolerating meds well.   Relevant past medical, surgical, family and social history reviewed and updated as indicated. Interim medical history since our last visit reviewed. Allergies and medications reviewed and updated.  Review of Systems  Constitutional: Negative.   Respiratory: Negative.    Cardiovascular: Negative.   Gastrointestinal: Negative.   Musculoskeletal: Negative.   Neurological: Negative.   Psychiatric/Behavioral: Negative.     Per HPI unless specifically indicated above     Objective:    BP 126/74   Pulse 60   Temp 98.1 F (36.7 C)   Ht 5' 9.5"  (1.765 m)   Wt 197 lb 3.2 oz (89.4 kg)   SpO2 98%   BMI 28.70 kg/m   Wt Readings from Last 3 Encounters:  08/13/21 197 lb 3.2 oz (89.4 kg)  08/02/21 197 lb 8 oz (89.6 kg)  07/19/21 202 lb 9.6 oz (91.9 kg)    Physical Exam Vitals and nursing note reviewed.  Constitutional:      General: He is not in acute distress.    Appearance: Normal appearance. He is not ill-appearing, toxic-appearing or diaphoretic.  HENT:     Head: Normocephalic and atraumatic.     Right Ear: External ear normal.     Left Ear: External ear normal.     Nose: Nose normal.     Mouth/Throat:     Mouth: Mucous membranes are moist.     Pharynx: Oropharynx is clear.  Eyes:     General: No scleral icterus.       Right eye: No discharge.        Left eye: No discharge.     Extraocular Movements: Extraocular movements intact.     Conjunctiva/sclera: Conjunctivae normal.     Pupils: Pupils are equal, round, and reactive to light.  Cardiovascular:     Rate and Rhythm: Normal rate and regular rhythm.     Pulses: Normal pulses.     Heart sounds: Normal heart sounds. No murmur heard.   No friction rub. No gallop.  Pulmonary:     Effort: Pulmonary effort is normal. No respiratory distress.     Breath sounds: Normal breath sounds. No stridor. No wheezing, rhonchi or rales.  Chest:     Chest  wall: No tenderness.  Musculoskeletal:        General: Normal range of motion.     Cervical back: Normal range of motion and neck supple.  Skin:    General: Skin is warm and dry.     Capillary Refill: Capillary refill takes less than 2 seconds.     Coloration: Skin is not jaundiced or pale.     Findings: No bruising, erythema, lesion or rash.  Neurological:     General: No focal deficit present.     Mental Status: He is alert and oriented to person, place, and time. Mental status is at baseline.  Psychiatric:        Mood and Affect: Mood normal.        Behavior: Behavior normal.        Thought Content: Thought content  normal.        Judgment: Judgment normal.    Results for orders placed or performed in visit on 07/29/21  PSA  Result Value Ref Range   Prostatic Specific Antigen 0.07 0.00 - 4.00 ng/mL      Assessment & Plan:   Problem List Items Addressed This Visit       Cardiovascular and Mediastinum   Hypertension    Under good control on current regimen. Continue current regimen. Continue to monitor. Call with any concerns. Refills given. Labs drawn today.        Relevant Medications   lovastatin (MEVACOR) 40 MG tablet   amLODipine-benazepril (LOTREL) 5-20 MG capsule   Other Relevant Orders   CBC with Differential/Platelet   Comprehensive metabolic panel   Microalbumin, Urine Waived   Aortic atherosclerosis (HCC)    Will keep BP and cholesterol under good control. Continue to monitor.       Relevant Medications   lovastatin (MEVACOR) 40 MG tablet   amLODipine-benazepril (LOTREL) 5-20 MG capsule   Other Relevant Orders   CBC with Differential/Platelet   Comprehensive metabolic panel     Endocrine   Controlled diabetes mellitus type 2 with complications ([place]) - Primary    Doing stable with A1c of 7.9. Increase rybelsus to 14mg  and continue actos for now. Recheck 3 months.       Relevant Medications   metFORMIN (GLUCOPHAGE-XR) 500 MG 24 hr tablet   lovastatin (MEVACOR) 40 MG tablet   dapagliflozin propanediol (FARXIGA) 10 MG TABS tablet   amLODipine-benazepril (LOTREL) 5-20 MG capsule   Semaglutide (RYBELSUS) 14 MG TABS   pioglitazone (ACTOS) 45 MG tablet   Other Relevant Orders   Bayer DCA Hb A1c Waived   CBC with Differential/Platelet   Comprehensive metabolic panel   Microalbumin, Urine Waived     Other   Hyperlipidemia    Under good control on current regimen. Continue current regimen. Continue to monitor. Call with any concerns. Refills given. Labs drawn today.        Relevant Medications   lovastatin (MEVACOR) 40 MG tablet   amLODipine-benazepril (LOTREL) 5-20  MG capsule   Other Relevant Orders   CBC with Differential/Platelet   Comprehensive metabolic panel   Lipid Panel w/o Chol/HDL Ratio   Gout    Under good control on current regimen. Continue current regimen. Continue to monitor. Call with any concerns. Refills given. Labs drawn today.        Relevant Medications   allopurinol (ZYLOPRIM) 100 MG tablet   Other Relevant Orders   CBC with Differential/Platelet   Comprehensive metabolic panel   Uric acid   Other Visit Diagnoses  Open wound of left hand, foreign body presence unspecified, unspecified wound type, initial encounter       Well healing. Td due and given today. Call with any concerns.    Relevant Orders   Td : Tetanus/diphtheria >7yo Preservative  free (Completed)   Essential hypertension       Relevant Medications   lovastatin (MEVACOR) 40 MG tablet   amLODipine-benazepril (LOTREL) 5-20 MG capsule        Follow up plan: Return in about 3 months (around 11/12/2021).

## 2021-08-13 NOTE — Assessment & Plan Note (Signed)
Under good control on current regimen. Continue current regimen. Continue to monitor. Call with any concerns. Refills given. Labs drawn today.   

## 2021-08-13 NOTE — Assessment & Plan Note (Signed)
Will keep BP and cholesterol under good control. Continue to monitor.  

## 2021-08-14 LAB — LIPID PANEL W/O CHOL/HDL RATIO
Cholesterol, Total: 162 mg/dL (ref 100–199)
HDL: 57 mg/dL (ref >39)
LDL Chol Calc (NIH): 80 mg/dL (ref 0–99)
Triglycerides: 144 mg/dL (ref 0–149)
VLDL Cholesterol Cal: 25 mg/dL (ref 5–40)

## 2021-08-14 LAB — CBC WITH DIFFERENTIAL/PLATELET
Basophils Absolute: 0 10*3/uL (ref 0.0–0.2)
Basos: 1 %
EOS (ABSOLUTE): 0.3 10*3/uL (ref 0.0–0.4)
Eos: 8 %
Hematocrit: 39.5 % (ref 37.5–51.0)
Hemoglobin: 13 g/dL (ref 13.0–17.7)
Immature Grans (Abs): 0 10*3/uL (ref 0.0–0.1)
Immature Granulocytes: 0 %
Lymphocytes Absolute: 1 10*3/uL (ref 0.7–3.1)
Lymphs: 28 %
MCH: 32.2 pg (ref 26.6–33.0)
MCHC: 32.9 g/dL (ref 31.5–35.7)
MCV: 98 fL — ABNORMAL HIGH (ref 79–97)
Monocytes Absolute: 0.3 10*3/uL (ref 0.1–0.9)
Monocytes: 9 %
Neutrophils Absolute: 2 10*3/uL (ref 1.4–7.0)
Neutrophils: 54 %
Platelets: 192 10*3/uL (ref 150–450)
RBC: 4.04 x10E6/uL — ABNORMAL LOW (ref 4.14–5.80)
RDW: 13.3 % (ref 11.6–15.4)
WBC: 3.7 10*3/uL (ref 3.4–10.8)

## 2021-08-14 LAB — COMPREHENSIVE METABOLIC PANEL
ALT: 18 IU/L (ref 0–44)
AST: 21 IU/L (ref 0–40)
Albumin/Globulin Ratio: 2.1 (ref 1.2–2.2)
Albumin: 4.5 g/dL (ref 3.8–4.8)
Alkaline Phosphatase: 62 IU/L (ref 44–121)
BUN/Creatinine Ratio: 17 (ref 10–24)
BUN: 20 mg/dL (ref 8–27)
Bilirubin Total: 0.2 mg/dL (ref 0.0–1.2)
CO2: 20 mmol/L (ref 20–29)
Calcium: 9.7 mg/dL (ref 8.6–10.2)
Chloride: 99 mmol/L (ref 96–106)
Creatinine, Ser: 1.15 mg/dL (ref 0.76–1.27)
Globulin, Total: 2.1 g/dL (ref 1.5–4.5)
Glucose: 194 mg/dL — ABNORMAL HIGH (ref 70–99)
Potassium: 4.3 mmol/L (ref 3.5–5.2)
Sodium: 138 mmol/L (ref 134–144)
Total Protein: 6.6 g/dL (ref 6.0–8.5)
eGFR: 68 mL/min/{1.73_m2} (ref >59)

## 2021-08-14 LAB — URIC ACID: Uric Acid: 5.8 mg/dL (ref 3.8–8.4)

## 2021-08-27 DIAGNOSIS — H35372 Puckering of macula, left eye: Secondary | ICD-10-CM | POA: Diagnosis not present

## 2021-08-27 DIAGNOSIS — H3581 Retinal edema: Secondary | ICD-10-CM | POA: Diagnosis not present

## 2021-08-27 DIAGNOSIS — H43821 Vitreomacular adhesion, right eye: Secondary | ICD-10-CM | POA: Diagnosis not present

## 2021-08-27 DIAGNOSIS — H43811 Vitreous degeneration, right eye: Secondary | ICD-10-CM | POA: Diagnosis not present

## 2021-09-02 ENCOUNTER — Other Ambulatory Visit: Payer: Self-pay

## 2021-09-02 ENCOUNTER — Ambulatory Visit (INDEPENDENT_AMBULATORY_CARE_PROVIDER_SITE_OTHER): Payer: Medicare Other | Admitting: Dermatology

## 2021-09-02 DIAGNOSIS — L719 Rosacea, unspecified: Secondary | ICD-10-CM

## 2021-09-02 DIAGNOSIS — L814 Other melanin hyperpigmentation: Secondary | ICD-10-CM

## 2021-09-02 DIAGNOSIS — L578 Other skin changes due to chronic exposure to nonionizing radiation: Secondary | ICD-10-CM | POA: Diagnosis not present

## 2021-09-02 DIAGNOSIS — L821 Other seborrheic keratosis: Secondary | ICD-10-CM | POA: Diagnosis not present

## 2021-09-02 DIAGNOSIS — L570 Actinic keratosis: Secondary | ICD-10-CM | POA: Diagnosis not present

## 2021-09-02 DIAGNOSIS — D1800 Hemangioma unspecified site: Secondary | ICD-10-CM | POA: Diagnosis not present

## 2021-09-02 DIAGNOSIS — L718 Other rosacea: Secondary | ICD-10-CM | POA: Diagnosis not present

## 2021-09-02 DIAGNOSIS — Z1283 Encounter for screening for malignant neoplasm of skin: Secondary | ICD-10-CM | POA: Diagnosis not present

## 2021-09-02 DIAGNOSIS — D229 Melanocytic nevi, unspecified: Secondary | ICD-10-CM | POA: Diagnosis not present

## 2021-09-02 NOTE — Progress Notes (Signed)
Follow-Up Visit   Subjective  Troy Christensen is a 70 y.o. male who presents for the following: Follow-up (Patient here today for 1 year tbse. ).                   The patient presents for Total-Body Skin Exam (TBSE) for skin cancer screening and mole check.  The patient has spots, moles and lesions to be evaluated, some may be new or changing and the patient has concerns that these could be cancer.  The following portions of the chart were reviewed this encounter and updated as appropriate:  Tobacco   Allergies   Meds   Problems   Med Hx   Surg Hx   Fam Hx      Review of Systems: No other skin or systemic complaints except as noted in HPI or Assessment and Plan.  Objective  Well appearing patient in no apparent distress; mood and affect are within normal limits.  A full examination was performed including scalp, head, eyes, ears, nose, lips, neck, chest, axillae, abdomen, back, buttocks, bilateral upper extremities, bilateral lower extremities, hands, feet, fingers, toes, fingernails, and toenails. All findings within normal limits unless otherwise noted below.  Head - Anterior (Face) Mid face erythema with telangiectasias   Scalp, face x 13 (13) Erythematous thin papules/macules with gritty scale.    Assessment & Plan  Rosacea with ocular rosacea Head - Anterior (Face) Rosacea is a chronic progressive skin condition usually affecting the face of adults, causing redness and/or acne bumps. It is treatable but not curable. It sometimes affects the eyes (ocular rosacea) as well. It may respond to topical and/or systemic medication and can flare with stress, sun exposure, alcohol, exercise and some foods.  Daily application of broad spectrum spf 30+ sunscreen to face is recommended to reduce flares. Discussed treatment options today.  Patient declines treatment at this time.  Actinic keratosis (13) Scalp, face x 13 Actinic keratoses are precancerous spots that appear secondary to  cumulative UV radiation exposure/sun exposure over time. They are chronic with expected duration over 1 year. A portion of actinic keratoses will progress to squamous cell carcinoma of the skin. It is not possible to reliably predict which spots will progress to skin cancer and so treatment is recommended to prevent development of skin cancer.  Recommend daily broad spectrum sunscreen SPF 30+ to sun-exposed areas, reapply every 2 hours as needed.  Recommend staying in the shade or wearing long sleeves, sun glasses (UVA+UVB protection) and wide brim hats (4-inch brim around the entire circumference of the hat). Call for new or changing lesions.  Destruction of lesion - Scalp, face x 13 Complexity: simple   Destruction method: cryotherapy   Informed consent: discussed and consent obtained   Timeout:  patient name, date of birth, surgical site, and procedure verified Lesion destroyed using liquid nitrogen: Yes   Region frozen until ice ball extended beyond lesion: Yes   Outcome: patient tolerated procedure well with no complications   Post-procedure details: wound care instructions given   Additional details:  Prior to procedure, discussed risks of blister formation, small wound, skin dyspigmentation, or rare scar following cryotherapy. Recommend Vaseline ointment to treated areas while healing.  Skin cancer screening  Lentigines - Scattered tan macules - Due to sun exposure - Benign-appearing, observe - Recommend daily broad spectrum sunscreen SPF 30+ to sun-exposed areas, reapply every 2 hours as needed. - Call for any changes  Seborrheic Keratoses - Stuck-on, waxy, tan-brown papules  and/or plaques  - Benign-appearing - Discussed benign etiology and prognosis. - Observe - Call for any changes  Melanocytic Nevi - Tan-brown and/or pink-flesh-colored symmetric macules and papules - Benign appearing on exam today - Observation - Call clinic for new or changing moles - Recommend daily  use of broad spectrum spf 30+ sunscreen to sun-exposed areas.   Hemangiomas - Red papules - Discussed benign nature - Observe - Call for any changes  Actinic Damage - Chronic condition, secondary to cumulative UV/sun exposure - diffuse scaly erythematous macules with underlying dyspigmentation - Recommend daily broad spectrum sunscreen SPF 30+ to sun-exposed areas, reapply every 2 hours as needed.  - Staying in the shade or wearing long sleeves, sun glasses (UVA+UVB protection) and wide brim hats (4-inch brim around the entire circumference of the hat) are also recommended for sun protection.  - Call for new or changing lesions.  Skin cancer screening performed today.  Return for 1 year tbse . IRuthell Rummage, CMA, am acting as scribe for Sarina Ser, MD. Documentation: I have reviewed the above documentation for accuracy and completeness, and I agree with the above.  Sarina Ser, MD

## 2021-09-02 NOTE — Patient Instructions (Addendum)
Actinic keratoses are precancerous spots that appear secondary to cumulative UV radiation exposure/sun exposure over time. They are chronic with expected duration over 1 year. A portion of actinic keratoses will progress to squamous cell carcinoma of the skin. It is not possible to reliably predict which spots will progress to skin cancer and so treatment is recommended to prevent development of skin cancer.  Recommend daily broad spectrum sunscreen SPF 30+ to sun-exposed areas, reapply every 2 hours as needed.  Recommend staying in the shade or wearing long sleeves, sun glasses (UVA+UVB protection) and wide brim hats (4-inch brim around the entire circumference of the hat). Call for new or changing lesions.   Cryotherapy Aftercare  Wash gently with soap and water everyday.   Apply Vaseline and Band-Aid daily until healed.      Melanoma ABCDEs  Melanoma is the most dangerous type of skin cancer, and is the leading cause of death from skin disease.  You are more likely to develop melanoma if you: Have light-colored skin, light-colored eyes, or red or blond hair Spend a lot of time in the sun Tan regularly, either outdoors or in a tanning bed Have had blistering sunburns, especially during childhood Have a close family member who has had a melanoma Have atypical moles or large birthmarks  Early detection of melanoma is key since treatment is typically straightforward and cure rates are extremely high if we catch it early.   The first sign of melanoma is often a change in a mole or a new dark spot.  The ABCDE system is a way of remembering the signs of melanoma.  A for asymmetry:  The two halves do not match. B for border:  The edges of the growth are irregular. C for color:  A mixture of colors are present instead of an even brown color. D for diameter:  Melanomas are usually (but not always) greater than 5mm - the size of a pencil eraser. E for evolution:  The spot keeps changing in  size, shape, and color.  Please check your skin once per month between visits. You can use a small mirror in front and a large mirror behind you to keep an eye on the back side or your body.   If you see any new or changing lesions before your next follow-up, please call to schedule a visit.  Please continue daily skin protection including broad spectrum sunscreen SPF 30+ to sun-exposed areas, reapplying every 2 hours as needed when you're outdoors.   Staying in the shade or wearing long sleeves, sun glasses (UVA+UVB protection) and wide brim hats (4-inch brim around the entire circumference of the hat) are also recommended for sun protection.    If You Need Anything After Your Visit  If you have any questions or concerns for your doctor, please call our main line at [phone] and press option 4 to reach your doctor's medical assistant. If no one answers, please leave a voicemail as directed and we will return your call as soon as possible. Messages left after 4 pm will be answered the following business day.   You may also send [place] a message via [place]. We typically respond to MyChart messages within 1-2 business days.  For prescription refills, please ask your pharmacy to contact our office. Our fax number is [phone].  If you have an urgent issue when the clinic is closed that cannot wait until the next business day, you can page your doctor at the number below.  Please note that while we do our best to be available for urgent issues outside of office hours, we are not available 24/7.   If you have an urgent issue and are unable to reach [place], you may choose to seek medical care at your doctor's office, retail clinic, urgent care center, or emergency room.  If you have a medical emergency, please immediately call 911 or go to the emergency department.  Pager Numbers  - Dr. Nehemiah Massed: [phone]  - Dr. Laurence Ferrari: [phone]  - Dr. Nicole Kindred: [phone]  In the event of  inclement weather, please call our main line at [phone] for an update on the status of any delays or closures.  Dermatology Medication Tips: Please keep the boxes that topical medications come in in order to help keep track of the instructions about where and how to use these. Pharmacies typically print the medication instructions only on the boxes and not directly on the medication tubes.   If your medication is too expensive, please contact our office at [phone] option 4 or send [place] a message through [place].   We are unable to tell what your co-pay for medications will be in advance as this is different depending on your insurance coverage. However, we may be able to find a substitute medication at lower cost or fill out paperwork to get insurance to cover a needed medication.   If a prior authorization is required to get your medication covered by your insurance company, please allow [place] 1-2 business days to complete this process.  Drug prices often vary depending on where the prescription is filled and some pharmacies may offer cheaper prices.  The website www.goodrx.com contains coupons for medications through different pharmacies. The prices here do not account for what the cost may be with help from insurance (it may be cheaper with your insurance), but the website can give you the price if you did not use any insurance.  - You can print the associated coupon and take it with your prescription to the pharmacy.  - You may also stop by our office during regular business hours and pick up a GoodRx coupon card.  - If you need your prescription sent electronically to a different pharmacy, notify our office through [facility] or by phone at [phone] option 4.     Si Usted Necesita Algo Despus de Su Visita  Tambin puede enviarnos un mensaje a travs de Pharmacist, community. Por lo general respondemos a los mensajes de MyChart en el transcurso de 1 a 2 das hbiles.  Para renovar  recetas, por favor pida a su farmacia que se ponga en contacto con nuestra oficina. Harland Dingwall de fax es [place] [phone].  Si tiene un asunto urgente cuando la clnica est cerrada y que no puede esperar hasta el siguiente da hbil, puede llamar/localizar a su doctor(a) al nmero que aparece a continuacin.   Por favor, tenga en cuenta que aunque hacemos todo lo posible para estar disponibles para asuntos urgentes fuera del horario de [place], no estamos disponibles las 24 horas del da, los 7 das de la [place].   Si tiene un problema urgente y no puede comunicarse con nosotros, puede optar por buscar atencin mdica  en el consultorio de su doctor(a), en una clnica privada, en un centro de atencin urgente o en una sala de emergencias.  Si tiene Engineering geologist, por favor llame inmediatamente al 911 o vaya a la sala de emergencias.  Nmeros de bper  - Dr. Nehemiah Massed:  [phone]  - Dra. Moye: [phone]  - Dra. Nicole Kindred: [phone]  En caso de inclemencias del [place], por favor llame a Johnsie Kindred principal al [phone] para una actualizacin sobre el [place] de cualquier retraso o cierre.  Consejos para la medicacin en dermatologa: Por favor, guarde las cajas en las que vienen los medicamentos de uso tpico para ayudarle a seguir las instrucciones sobre dnde y cmo usarlos. Las farmacias generalmente imprimen las instrucciones del medicamento slo en las cajas y no directamente en los tubos del [place].   Si su medicamento es muy caro, por favor, pngase en contacto con Zigmund Daniel llamando al [phone] y presione la opcin 4 o envenos un mensaje a travs de Pharmacist, community.   No podemos decirle cul ser su copago por los medicamentos por adelantado ya que esto es diferente dependiendo de la cobertura de su seguro. Sin embargo, es posible que podamos encontrar un medicamento sustituto a Electrical engineer un formulario para que el seguro cubra el medicamento  que se considera necesario.   Si se requiere una autorizacin previa para que su compaa de seguros [place] su medicamento, por favor permtanos de 1 a 2 das hbiles para completar este proceso.  Los precios de los medicamentos varan con frecuencia dependiendo del Environmental consultant de dnde se surte la receta y alguna farmacias pueden ofrecer precios ms baratos.  El sitio web www.goodrx.com tiene cupones para medicamentos de Airline pilot. Los precios aqu no tienen en cuenta lo que podra costar con la ayuda del seguro (puede ser ms barato con su seguro), pero el sitio web puede darle el precio si no utiliz Research scientist (physical sciences).  - Puede imprimir el cupn correspondiente y llevarlo con su receta a la farmacia.  - Tambin puede pasar por nuestra oficina durante el horario de atencin regular y Charity fundraiser una tarjeta de cupones de GoodRx.  - Si necesita que su receta se enve electrnicamente a una farmacia diferente, informe a nuestra oficina a travs de MyChart de [place] o por telfono llamando al [phone] y presione la opcin 4.

## 2021-09-14 ENCOUNTER — Encounter: Payer: Self-pay | Admitting: Dermatology

## 2021-09-18 ENCOUNTER — Encounter: Payer: Self-pay | Admitting: Internal Medicine

## 2021-09-23 ENCOUNTER — Other Ambulatory Visit: Payer: Self-pay

## 2021-09-24 ENCOUNTER — Other Ambulatory Visit: Payer: Self-pay

## 2021-09-24 ENCOUNTER — Encounter: Payer: Self-pay | Admitting: Nurse Practitioner

## 2021-09-24 ENCOUNTER — Ambulatory Visit (INDEPENDENT_AMBULATORY_CARE_PROVIDER_SITE_OTHER): Payer: Medicare Other | Admitting: Nurse Practitioner

## 2021-09-24 VITALS — BP 123/67 | HR 90 | Temp 99.4°F | Ht 70.0 in | Wt 194.4 lb

## 2021-09-24 DIAGNOSIS — Z96651 Presence of right artificial knee joint: Secondary | ICD-10-CM | POA: Insufficient documentation

## 2021-09-24 DIAGNOSIS — J069 Acute upper respiratory infection, unspecified: Secondary | ICD-10-CM

## 2021-09-24 DIAGNOSIS — M25519 Pain in unspecified shoulder: Secondary | ICD-10-CM | POA: Insufficient documentation

## 2021-09-24 DIAGNOSIS — H269 Unspecified cataract: Secondary | ICD-10-CM | POA: Insufficient documentation

## 2021-09-24 DIAGNOSIS — Z860100 Personal history of colon polyps, unspecified: Secondary | ICD-10-CM | POA: Insufficient documentation

## 2021-09-24 DIAGNOSIS — N529 Male erectile dysfunction, unspecified: Secondary | ICD-10-CM | POA: Insufficient documentation

## 2021-09-24 DIAGNOSIS — Z808 Family history of malignant neoplasm of other organs or systems: Secondary | ICD-10-CM | POA: Insufficient documentation

## 2021-09-24 NOTE — Patient Instructions (Signed)
Your symptoms and exam findings are most consistent with a viral upper respiratory infection. These usually run their course in 5-7 days. Unfortunately, antibiotics don't work against viruses and just increase your risk of other issues such as diarrhea, yeast infections, and resistant infections.  If you start feeling worse with facial pain, high fever, cough, shortness of breath or start feeling significantly worse, please call us right away to be further evaluated.  Some things that can make you feel better are: - Increased rest - Increasing Fluids - Acetaminophen / ibuprofen as needed for fever/pain.  - Salt water gargling, chloraseptic spray and throat lozenges - OTC pseudoephedrine or coricidin if you have a history of high blood pressure or take blood pressure medications - Mucinex.  - Saline sinus flushes or a neti pot.  - Humidifying the air.  

## 2021-09-24 NOTE — Progress Notes (Signed)
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   Patient diagnosed with stage II prostate cancer approximately 6 years ago. Status post biopsy adenocarcinoma Gleason score was 4+4. Patient underwent radiation. He thinks he might have received 1 Lupron injection at the time of the radiation. As per the patient there were some concerns for cost also.   Patient has been monitored closely by radiation oncology and consistent PSA checks.  His PSA had risen to 2.03 prompting further staging.  PET scan (F18-Pylarify) on 01/07/2021 showed the base of the prostate gland being suggestive of primary prostate carcinoma. There was no evidence of metastatic adenopathy within pelvis or retroperitoneum. No evidence of visceral metastasis or skeletal metastasis.  He underwent salvage brachytherapy on 03/25/2021 and who developed postoperative urinary retention requiring Foley placement and clot retention requiring catheter irrigation  His most recent PSA on 07/29/2021 was 0.07.  He is accompanied by his wife today. He reports that he has had improvement on Flomax. He has no side effects from this medication. He feels that he is voiding adequately. He denies any bleeding or burning during urination.     PMH: Past Medical History:  Diagnosis Date   Cancer [facility])    Prostate   Diabetes mellitus without complication ([place])    Glaucoma    Gout    Hypercholesteremia    Hypertension    Osteoarthritis    hands   Prostate cancer [facility])     Surgical History: Past Surgical History:  Procedure Laterality Date   EYE SURGERY Bilateral    march and april 2021- [place]     FLEXIBLE SIGMOIDOSCOPY N/A 01/10/2016   Procedure: FLEXIBLE SIGMOIDOSCOPY with  argon plasma coagulation;  Surgeon: Lucilla Lame, MD;  Location: [facility];  Service: Endoscopy;  Laterality: N/A;  Diabetic - oral meds   HERNIA REPAIR     JOINT REPLACEMENT  2014   Right Knee Replacement   MENISCUS REPAIR Left    nephrolithiasis     pt denies   PROSTATE SURGERY     Radiation treatments - no surgery   RADIOACTIVE SEED IMPLANT N/A 03/25/2021   Procedure: RADIOACTIVE SEED IMPLANT/BRACHYTHERAPY IMPLANT;  Surgeon: Hollice Espy, MD;  Location: [facility];  Service: Urology;  Laterality: N/A;   RETINAL DETACHMENT SURGERY Bilateral    One surgery in March 2021, and Second in April 2021   [place] Right 2012    Home Medications:  Allergies as of 09/25/2021   No Known Allergies      Medication List        Accurate as of September 25, 2021 10:51 AM. If you have any questions, ask your nurse or doctor.          allopurinol 100 MG tablet Commonly known as: ZYLOPRIM Take 1 tablet (100 mg total) by mouth daily.   amLODipine-benazepril 5-20 MG capsule Commonly known as: LOTREL TAKE 2 CAPSULES ONCE EACH DAY   amoxicillin 500 MG capsule Commonly known as: AMOXIL   aspirin EC 81 MG tablet Take 81 mg by mouth daily.   dapagliflozin propanediol 10 MG Tabs tablet Commonly known as: [place] Take  1 tablet (10 mg total) by mouth daily.   FISH OIL PO Take 1 capsule by mouth daily.   glucose blood test strip 1 each by Other route as needed for other. Use contour next test strip to check sugar 2 times daily   lovastatin 40 MG tablet Commonly known as: MEVACOR Take 1 tablet (40 mg total) by mouth daily.   metFORMIN 500 MG 24 hr tablet Commonly known as: GLUCOPHAGE-XR Take 2 tablets (1,000 mg total) by mouth 2 (two) times daily.   pioglitazone 45 MG tablet Commonly known as: ACTOS Take 1 tablet (45 mg total) by mouth daily.   Rybelsus 14 MG Tabs Generic drug:  Semaglutide Take 14 mg by mouth daily.   sildenafil 20 MG tablet Commonly known as: REVATIO Take 1-5 tablets (20-100 mg total) by mouth as needed. What changed:  how much to take when to take this reasons to take this   tamsulosin 0.4 MG Caps capsule Commonly known as: FLOMAX Take 1 capsule (0.4 mg total) by mouth daily after supper.   timolol 0.5 % ophthalmic solution Commonly known as: TIMOPTIC Place 1 drop into both eyes 2 (two) times daily.        Allergies: No Known Allergies  Family History: Family History  Problem Relation Age of Onset   Cancer Father 37       lung   Emphysema Mother    Dementia Mother    Cancer Brother        melanoma    Social History:  reports that he quit smoking about 46 years ago. His smoking use included cigarettes. He has never used smokeless tobacco. He reports that he does not drink alcohol and does not use drugs.   Physical Exam: BP 130/72    Pulse 87    Ht 5\' 10"  (1.778 m)    Wt 194 lb (88 kg)    BMI 27.84 kg/m   Constitutional:  Alert and oriented, No acute distress. HEENT: [place] AT, moist mucus membranes.  Trachea midline, no masses. Cardiovascular: No clubbing, cyanosis, or edema. Respiratory: Normal respiratory effort, no increased work of breathing. Skin: No rashes, bruises or suspicious lesions. Neurologic: Grossly intact, no focal deficits, moving all 4 extremities. Psychiatric: Normal mood and affect.  Laboratory Data:  Lab Results  Component Value Date   CREATININE 1.15 08/13/2021   Lab Results  Component Value Date   PSA 0.04 05/09/2016   PSA 0.13 10/24/2015   Lab Results  Component Value Date   HGBA1C 7.9 (H) 08/13/2021   Assessment & Plan:    Prostate cancer  - S/p salvage brachytherapy seed implant  - PSA; future, every 6 months for 5 years.  - Has seen improvement on Flomax; continue Flomax as needed   Return in 1 year with IPSS and PVR (will see Dr. Donella Stade at the 9-month interval)  Altamese Gilchrist as a scribe for Hollice Espy, MD.,have documented all relevant documentation on the behalf of Hollice Espy, MD,as directed by  Hollice Espy, MD while in the presence of Hollice Espy, MD.  I have reviewed the above documentation for accuracy and completeness, and I agree with the above.   Hollice Espy, MD   [facility] [address], [place], [place] [address] [phone]

## 2021-09-24 NOTE — Progress Notes (Signed)
Acute Office Visit  Subjective:    Patient ID: Troy Christensen, male    DOB: Apr 22, 1951, 71 y.o.   MRN: [id number]  Chief Complaint  Patient presents with   Sore Throat    Patient states yesterday afternoon, is when his symptoms started. Patient states it started with sore throat and then lead to stuffiness when he laid down. Patient states he had tried cold tablets. Patient declined being swab. Patient states he does not have Covid, just a common cold.    Nasal Congestion   Head Cold    HPI Patient is in today for sore throat and nasal congestion since yesterday.   UPPER RESPIRATORY TRACT INFECTION  Worst symptom: nasal congestion Fever: no Cough: no Shortness of breath: no Wheezing: no Chest pain: no Chest tightness: no Chest congestion: no Nasal congestion: yes Runny nose: yes Post nasal drip: no Sneezing: no Sore throat: yes Swollen glands: no Sinus pressure: no Headache: no Face pain: no Toothache: no Ear pain: no  Ear pressure: no  Eyes red/itching:no Eye drainage/crusting: no  Vomiting: no Rash: no Fatigue: yes Sick contacts: no Strep contacts: no  Context: stable Recurrent sinusitis: no Relief with OTC cold/cough medications:  some   Treatments attempted: generic cold medicine, allergy pill    Past Medical History:  Diagnosis Date   Cancer (HCC)    Prostate   Diabetes mellitus without complication (HCC)    Glaucoma    Gout    Hypercholesteremia    Hypertension    Osteoarthritis    hands   Prostate cancer [facility])     Past Surgical History:  Procedure Laterality Date   EYE SURGERY Bilateral    march and april 2021- [place]    FLEXIBLE SIGMOIDOSCOPY N/A 01/10/2016   Procedure: FLEXIBLE SIGMOIDOSCOPY with  argon plasma coagulation;  Surgeon: Midge Minium, MD;  Location: [facility];  Service: Endoscopy;  Laterality: N/A;  Diabetic - oral meds   HERNIA REPAIR     JOINT REPLACEMENT  2014   Right Knee Replacement   MENISCUS REPAIR  Left    nephrolithiasis     pt denies   PROSTATE SURGERY     Radiation treatments - no surgery   RADIOACTIVE SEED IMPLANT N/A 03/25/2021   Procedure: RADIOACTIVE SEED IMPLANT/BRACHYTHERAPY IMPLANT;  Surgeon: Vanna Scotland, MD;  Location: [facility];  Service: Urology;  Laterality: N/A;   RETINAL DETACHMENT SURGERY Bilateral    One surgery in March 2021, and Second in April 2021   ROTATOR CUFF REPAIR Right 2012    Family History  Problem Relation Age of Onset   Cancer Father 69       lung   Emphysema Mother    Dementia Mother    Cancer Brother        melanoma    Social History   Socioeconomic History   Marital status: Married    Spouse name: Not on file   Number of children: Not on file   Years of education: Not on file   Highest education level: Not on file  Occupational History   Occupation: retired  Tobacco Use   Smoking status: Former    Types: Cigarettes    Quit date: 03/26/1975    Years since quitting: 46.5   Smokeless tobacco: Never  Vaping Use   Vaping Use: Never used  Substance and Sexual Activity   Alcohol use: No    Alcohol/week: 0.0 standard drinks   Drug use: No   Sexual activity: Not Currently  Other Topics Concern   Not on file  Social History Narrative   Lives in [place]; with wife; 5 children. 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 Quit smoking 50 years; no alcohol.    Social Determinants of Health   Financial Resource Strain: Low Risk    Difficulty of Paying Living Expenses: Not hard at all  Food Insecurity: No Food Insecurity   Worried About Charity fundraiser in the Last Year: Never true   [place] in the Last Year: Never true  Transportation Needs: No Transportation Needs   Lack of Transportation (Medical): No   Lack of Transportation (Non-Medical): No  Physical Activity: Sufficiently Active   Days of Exercise per Week: 5 days   Minutes of Exercise per Session: 30 min  Stress: No Stress Concern Present   Feeling of  Stress : Not at all  Social Connections: Moderately Integrated   Frequency of Communication with Friends and Family: More than three times a week   Frequency of Social Gatherings with Friends and Family: Three times a week   Attends Religious Services: More than 4 times per year   Active Member of Clubs or Organizations: No   Attends Archivist Meetings: Never   Marital Status: Married  Human resources officer Violence: Not At Risk   Fear of Current or Ex-Partner: No   Emotionally Abused: No   Physically Abused: No   Sexually Abused: No    Outpatient Medications Prior to Visit  Medication Sig Dispense Refill   allopurinol (ZYLOPRIM) 100 MG tablet Take 1 tablet (100 mg total) by mouth daily. 90 tablet 1   amLODipine-benazepril (LOTREL) 5-20 MG capsule TAKE 2 CAPSULES ONCE EACH DAY 180 capsule 1   aspirin EC 81 MG tablet Take 81 mg by mouth daily.     dapagliflozin propanediol (FARXIGA) 10 MG TABS tablet Take 1 tablet (10 mg total) by mouth daily. 90 tablet 1   glucose blood test strip 1 each by Other route as needed for other. Use contour next test strip to check sugar 2 times daily 200 each 4   lovastatin (MEVACOR) 40 MG tablet Take 1 tablet (40 mg total) by mouth daily. 90 tablet 1   metFORMIN (GLUCOPHAGE-XR) 500 MG 24 hr tablet Take 2 tablets (1,000 mg total) by mouth 2 (two) times daily. 360 tablet 1   Omega-3 Fatty Acids (FISH OIL PO) Take 1 capsule by mouth daily.     pioglitazone (ACTOS) 45 MG tablet Take 1 tablet (45 mg total) by mouth daily. 90 tablet 1   Semaglutide (RYBELSUS) 14 MG TABS Take 14 mg by mouth daily. 90 tablet 1   sildenafil (REVATIO) 20 MG tablet Take 1-5 tablets (20-100 mg total) by mouth as needed. (Patient taking differently: Take 80-100 mg by mouth daily as needed (ED).) 50 tablet 12   tamsulosin (FLOMAX) 0.4 MG CAPS capsule Take 1 capsule (0.4 mg total) by mouth daily after supper. 30 capsule 6   timolol (TIMOPTIC) 0.5 % ophthalmic solution Place 1 drop  into both eyes 2 (two) times daily.     amoxicillin (AMOXIL) 500 MG capsule  (Patient not taking: Reported on 09/24/2021)     Facility-Administered Medications Prior to Visit  Medication Dose Route Frequency Provider Last Rate Last Admin   ceFAZolin (ANCEF) 2 g in dextrose 5 % 100 mL IVPB  2 g Intravenous Q8H Hollice Espy, MD        No Known Allergies  Review of Systems  Constitutional:  Positive for fatigue. Negative for fever.  HENT:  Positive for congestion, rhinorrhea and sore throat. Negative for postnasal drip, sinus pressure and sneezing.   Eyes: Negative.   Respiratory: Negative.    Cardiovascular: Negative.   Gastrointestinal: Negative.   Endocrine: Negative.   Genitourinary: Negative.   Musculoskeletal: Negative.   Skin: Negative.   Neurological: Negative.   Psychiatric/Behavioral: Negative.        Objective:    Physical Exam Vitals and nursing note reviewed.  Constitutional:      Appearance: Normal appearance.  HENT:     Head: Normocephalic.     Right Ear: Tympanic membrane, ear canal and external ear normal.     Left Ear: Tympanic membrane, ear canal and external ear normal.  Eyes:     Conjunctiva/sclera: Conjunctivae normal.  Cardiovascular:     Rate and Rhythm: Normal rate and regular rhythm.     Pulses: Normal pulses.     Heart sounds: Normal heart sounds.  Pulmonary:     Effort: Pulmonary effort is normal.     Breath sounds: Normal breath sounds.  Musculoskeletal:     Cervical back: Normal range of motion.  Skin:    General: Skin is warm.  Neurological:     General: No focal deficit present.     Mental Status: He is alert and oriented to person, place, and time.  Psychiatric:        Mood and Affect: Mood normal.        Behavior: Behavior normal.        Thought Content: Thought content normal.        Judgment: Judgment normal.    BP 123/67    Pulse 90    Temp 99.4 F (37.4 C) (Oral)    Ht 5\' 10"  (1.778 m)    Wt 194 lb 6.4 oz (88.2 kg)     SpO2 98%    BMI 27.89 kg/m  Wt Readings from Last 3 Encounters:  09/24/21 194 lb 6.4 oz (88.2 kg)  08/13/21 197 lb 3.2 oz (89.4 kg)  08/02/21 197 lb 8 oz (89.6 kg)    Health Maintenance Due  Topic Date Due   Zoster Vaccines- Shingrix (1 of 2) Never done   COVID-19 Vaccine (3 - Moderna risk series) 12/24/2019    There are no preventive care reminders to display for this patient.   Lab Results  Component Value Date   TSH 1.460 01/30/2021   Lab Results  Component Value Date   WBC 3.7 08/13/2021   HGB 13.0 08/13/2021   HCT 39.5 08/13/2021   MCV 98 (H) 08/13/2021   PLT 192 08/13/2021   Lab Results  Component Value Date   NA 138 08/13/2021   K 4.3 08/13/2021   CO2 20 08/13/2021   GLUCOSE 194 (H) 08/13/2021   BUN 20 08/13/2021   CREATININE 1.15 08/13/2021   BILITOT 0.2 08/13/2021   ALKPHOS 62 08/13/2021   AST 21 08/13/2021   ALT 18 08/13/2021   PROT 6.6 08/13/2021   ALBUMIN 4.5 08/13/2021   CALCIUM 9.7 08/13/2021   ANIONGAP 14 03/11/2019   EGFR 68 08/13/2021   Lab Results  Component Value Date   CHOL 162 08/13/2021   Lab Results  Component Value Date   HDL 57 08/13/2021   Lab Results  Component Value Date   LDLCALC 80 08/13/2021   Lab Results  Component Value Date   TRIG 144 08/13/2021   Lab Results  Component Value Date  CHOLHDL 3.3 10/26/2018   Lab Results  Component Value Date   HGBA1C 7.9 (H) 08/13/2021       Assessment & Plan:   Problem List Items Addressed This Visit   None Visit Diagnoses     Upper respiratory tract infection, unspecified type    -  Primary   Declined flu and covid-19 testing. Most likely viral. Encourage rest, fluids. Can continue OTC cold medicine, nasal saline spray. F/U if not improving       No orders of the defined types were placed in this encounter.   Charyl Dancer, NP

## 2021-09-25 ENCOUNTER — Ambulatory Visit (INDEPENDENT_AMBULATORY_CARE_PROVIDER_SITE_OTHER): Payer: Medicare Other | Admitting: Urology

## 2021-09-25 VITALS — BP 130/72 | HR 87 | Ht 70.0 in | Wt 194.0 lb

## 2021-09-25 DIAGNOSIS — C61 Malignant neoplasm of prostate: Secondary | ICD-10-CM | POA: Diagnosis not present

## 2021-09-26 ENCOUNTER — Ambulatory Visit: Payer: Medicare Other | Admitting: Family Medicine

## 2021-10-04 ENCOUNTER — Encounter: Payer: Self-pay | Admitting: Internal Medicine

## 2021-10-04 DIAGNOSIS — E113312 Type 2 diabetes mellitus with moderate nonproliferative diabetic retinopathy with macular edema, left eye: Secondary | ICD-10-CM | POA: Diagnosis not present

## 2021-11-01 DIAGNOSIS — E113212 Type 2 diabetes mellitus with mild nonproliferative diabetic retinopathy with macular edema, left eye: Secondary | ICD-10-CM | POA: Diagnosis not present

## 2021-11-01 DIAGNOSIS — H34832 Tributary (branch) retinal vein occlusion, left eye, with macular edema: Secondary | ICD-10-CM | POA: Diagnosis not present

## 2021-11-01 DIAGNOSIS — H35372 Puckering of macula, left eye: Secondary | ICD-10-CM | POA: Diagnosis not present

## 2021-11-01 DIAGNOSIS — H35033 Hypertensive retinopathy, bilateral: Secondary | ICD-10-CM | POA: Diagnosis not present

## 2021-11-02 DIAGNOSIS — Z20822 Contact with and (suspected) exposure to covid-19: Secondary | ICD-10-CM | POA: Diagnosis not present

## 2021-11-06 ENCOUNTER — Telehealth: Payer: Self-pay | Admitting: Family Medicine

## 2021-11-06 DIAGNOSIS — Z20822 Contact with and (suspected) exposure to covid-19: Secondary | ICD-10-CM | POA: Diagnosis not present

## 2021-11-06 NOTE — Telephone Encounter (Signed)
Pt called in about Rybelsus 14mg , says he was told he cant get it from [facility] anymore, because he had 3 months recently with them and was told he had ot get it from [facility] and it costs 200.00 he cant jafford, so he is asking for an alternative. Please call back

## 2021-11-06 NOTE — Telephone Encounter (Signed)
Spoke with [name] from [facility] and was informed that patient wasn't due for a refill at the time he was told he could not received it. Troy Christensen says she can refill patient's prescription for Rybelus now as he is due for $38. Patient was notified and verbalized understanding and has no further questions at this time.

## 2021-11-13 ENCOUNTER — Ambulatory Visit (INDEPENDENT_AMBULATORY_CARE_PROVIDER_SITE_OTHER): Payer: Medicare Other | Admitting: Family Medicine

## 2021-11-13 ENCOUNTER — Other Ambulatory Visit: Payer: Self-pay

## 2021-11-13 ENCOUNTER — Encounter: Payer: Self-pay | Admitting: Internal Medicine

## 2021-11-13 ENCOUNTER — Encounter: Payer: Self-pay | Admitting: Family Medicine

## 2021-11-13 VITALS — BP 108/67 | HR 73 | Temp 98.4°F | Ht 70.0 in | Wt 196.0 lb

## 2021-11-13 DIAGNOSIS — E118 Type 2 diabetes mellitus with unspecified complications: Secondary | ICD-10-CM | POA: Diagnosis not present

## 2021-11-13 LAB — BAYER DCA HB A1C WAIVED: HB A1C (BAYER DCA - WAIVED): 7 % — ABNORMAL HIGH (ref 4.8–5.6)

## 2021-11-13 NOTE — Progress Notes (Signed)
? ?BP 108/67   Pulse 73   Temp 98.4 ?F (36.9 ?C) (Oral)   Ht 5\' 10"  (1.778 m)   Wt 196 lb (88.9 kg)   SpO2 100%   BMI 28.12 kg/m?   ? ?Subjective:  ? ? Patient ID: Troy Christensen, male    DOB: 1950/09/28, 71 y.o.   MRN: [id number] ? ?HPI: ?Troy Christensen is a 71 y.o. male ? ?Chief Complaint  ?Patient presents with  ? Diabetes  ? ?DIABETES ?Hypoglycemic episodes:no ?Polydipsia/polyuria: no ?Visual disturbance: no ?Chest pain: no ?Paresthesias: no ?Glucose Monitoring: yes ? Accucheck frequency: 110s-120s ?Taking Insulin?: no ?Blood Pressure Monitoring: not checking ?Retinal Examination: Up to Date ?Foot Exam: Up to Date ?Diabetic Education: Completed ?Pneumovax: Up to Date ?Influenza: Up to Date ?Aspirin: yes ? ?Relevant past medical, surgical, family and social history reviewed and updated as indicated. Interim medical history since our last visit reviewed. ?Allergies and medications reviewed and updated. ? ?Review of Systems  ?Constitutional: Negative.   ?Respiratory: Negative.    ?Cardiovascular: Negative.   ?Gastrointestinal: Negative.   ?Musculoskeletal: Negative.   ?Psychiatric/Behavioral: Negative.    ? ?Per HPI unless specifically indicated above ? ?   ?Objective:  ?  ?BP 108/67   Pulse 73   Temp 98.4 ?F (36.9 ?C) (Oral)   Ht 5\' 10"  (1.778 m)   Wt 196 lb (88.9 kg)   SpO2 100%   BMI 28.12 kg/m?   ?Wt Readings from Last 3 Encounters:  ?11/13/21 196 lb (88.9 kg)  ?09/25/21 194 lb (88 kg)  ?09/24/21 194 lb 6.4 oz (88.2 kg)  ?  ?Physical Exam ?Vitals and nursing note reviewed.  ?Constitutional:   ?   General: He is not in acute distress. ?   Appearance: Normal appearance. He is not ill-appearing, toxic-appearing or diaphoretic.  ?HENT:  ?   Head: Normocephalic and atraumatic.  ?   Right Ear: External ear normal.  ?   Left Ear: External ear normal.  ?   Nose: Nose normal.  ?   Mouth/Throat:  ?   Mouth: Mucous membranes are moist.  ?   Pharynx: Oropharynx is clear.  ?Eyes:  ?   General: No scleral  icterus.    ?   Right eye: No discharge.     ?   Left eye: No discharge.  ?   Extraocular Movements: Extraocular movements intact.  ?   Conjunctiva/sclera: Conjunctivae normal.  ?   Pupils: Pupils are equal, round, and reactive to light.  ?Cardiovascular:  ?   Rate and Rhythm: Normal rate and regular rhythm.  ?   Pulses: Normal pulses.  ?   Heart sounds: Normal heart sounds. No murmur heard. ?  No friction rub. No gallop.  ?Pulmonary:  ?   Effort: Pulmonary effort is normal. No respiratory distress.  ?   Breath sounds: Normal breath sounds. No stridor. No wheezing, rhonchi or rales.  ?Chest:  ?   Chest wall: No tenderness.  ?Musculoskeletal:     ?   General: Normal range of motion.  ?   Cervical back: Normal range of motion and neck supple.  ?Skin: ?   General: Skin is warm and dry.  ?   Capillary Refill: Capillary refill takes less than 2 seconds.  ?   Coloration: Skin is not jaundiced or pale.  ?   Findings: No bruising, erythema, lesion or rash.  ?Neurological:  ?   General: No focal deficit present.  ?   Mental Status: He  is alert and oriented to person, place, and time. Mental status is at baseline.  ?Psychiatric:     ?   Mood and Affect: Mood normal.     ?   Behavior: Behavior normal.     ?   Thought Content: Thought content normal.     ?   Judgment: Judgment normal.  ? ? ?Results for orders placed or performed in visit on 08/19/21  ?HM DIABETES EYE EXAM  ?Result Value Ref Range  ? HM Diabetic Eye Exam Retinopathy (A) No Retinopathy  ? ?   ?Assessment & Plan:  ? ?Problem List Items Addressed This Visit   ? ?  ? Endocrine  ? Controlled diabetes mellitus type 2 with complications ([place]) - Primary  ?  Doing well with A1c of 7.0. Continue current regimen. Continue to monitor. Call with any concerns.  ?  ?  ? Relevant Orders  ? Bayer DCA Hb A1c Waived  ?  ? ?Follow up plan: ?Return in about 3 months (around 02/13/2022). ? ? ? ? ? ?

## 2021-11-13 NOTE — Assessment & Plan Note (Signed)
Doing well with A1c of 7.0. Continue current regimen. Continue to monitor. Call with any concerns.  

## 2021-11-15 DIAGNOSIS — Z20822 Contact with and (suspected) exposure to covid-19: Secondary | ICD-10-CM | POA: Diagnosis not present

## 2021-11-20 ENCOUNTER — Other Ambulatory Visit: Payer: Self-pay | Admitting: *Deleted

## 2021-11-20 MED ORDER — TAMSULOSIN HCL 0.4 MG PO CAPS: 0.4000 mg | ORAL_CAPSULE | Freq: Two times a day (BID) | ORAL | 6 refills | Status: DC

## 2021-11-27 ENCOUNTER — Ambulatory Visit (INDEPENDENT_AMBULATORY_CARE_PROVIDER_SITE_OTHER): Payer: Medicare Other | Admitting: Internal Medicine

## 2021-11-27 ENCOUNTER — Encounter: Payer: Self-pay | Admitting: Internal Medicine

## 2021-11-27 ENCOUNTER — Other Ambulatory Visit: Payer: Self-pay

## 2021-11-27 DIAGNOSIS — J069 Acute upper respiratory infection, unspecified: Secondary | ICD-10-CM | POA: Diagnosis not present

## 2021-11-27 MED ORDER — BENZONATATE 100 MG PO CAPS: 100.0000 mg | ORAL_CAPSULE | Freq: Two times a day (BID) | ORAL | 0 refills | Status: DC | PRN

## 2021-11-27 MED ORDER — FEXOFENADINE HCL 180 MG PO TABS: 180.0000 mg | ORAL_TABLET | Freq: Every day | ORAL | 1 refills | Status: DC

## 2021-11-27 MED ORDER — FLUTICASONE PROPIONATE 50 MCG/ACT NA SUSP: 2.0000 | Freq: Every day | NASAL | 6 refills | Status: DC

## 2021-11-27 NOTE — Progress Notes (Signed)
? ?BP 127/79   Pulse 73   Temp 97.7 ?F (36.5 ?C) (Oral)   Ht '5\' 10"'$  (1.778 m)   Wt 198 lb (89.8 kg)   SpO2 100%   BMI 28.41 kg/m?   ? ?Subjective:  ? ? Patient ID: Troy Christensen, male    DOB: Jan 22, 1951, 71 y.o.   MRN: [id number] ? ?Chief Complaint  ?Patient presents with  ? Sore Throat  ?  With cough and headache  Since yesterday  ? ? ?HPI: ?Troy Christensen is a 71 y.o. male ? ?Sore Throat  ?This is a new problem. The current episode started yesterday. The problem has been gradually worsening. There has been no fever. Associated symptoms include congestion, coughing and headaches. Pertinent negatives include no abdominal pain, diarrhea, drooling, ear discharge, ear pain, hoarse voice, neck pain, shortness of breath, stridor, swollen glands, trouble swallowing or vomiting. He has had no exposure to mono.  ? ?Chief Complaint  ?Patient presents with  ? Sore Throat  ?  With cough and headache  Since yesterday  ? ? ?Relevant past medical, surgical, family and social history reviewed and updated as indicated. Interim medical history since our last visit reviewed. ?Allergies and medications reviewed and updated. ? ?Review of Systems  ?HENT:  Positive for congestion. Negative for drooling, ear discharge, ear pain, hoarse voice and trouble swallowing.   ?Respiratory:  Positive for cough. Negative for shortness of breath and stridor.   ?Gastrointestinal:  Negative for abdominal pain, diarrhea and vomiting.  ?Musculoskeletal:  Negative for neck pain.  ?Neurological:  Positive for headaches.  ? ?Per HPI unless specifically indicated above ? ?   ?Objective:  ?  ?BP 127/79   Pulse 73   Temp 97.7 ?F (36.5 ?C) (Oral)   Ht '5\' 10"'$  (1.778 m)   Wt 198 lb (89.8 kg)   SpO2 100%   BMI 28.41 kg/m?   ?Wt Readings from Last 3 Encounters:  ?11/27/21 198 lb (89.8 kg)  ?11/13/21 196 lb (88.9 kg)  ?09/25/21 194 lb (88 kg)  ?  ?Physical Exam ?Vitals and nursing note reviewed.  ?Constitutional:   ?   General: He is not in acute  distress. ?   Appearance: Normal appearance. He is not ill-appearing or diaphoretic.  ?HENT:  ?   Head: Normocephalic and atraumatic.  ?   Right Ear: Tympanic membrane and external ear normal. There is no impacted cerumen.  ?   Left Ear: External ear normal.  ?   Nose: No congestion or rhinorrhea.  ?   Mouth/Throat:  ?   Pharynx: No oropharyngeal exudate or posterior oropharyngeal erythema.  ?Eyes:  ?   Conjunctiva/sclera: Conjunctivae normal.  ?   Pupils: Pupils are equal, round, and reactive to light.  ?Cardiovascular:  ?   Rate and Rhythm: Normal rate and regular rhythm.  ?   Heart sounds: No murmur heard. ?  No friction rub. No gallop.  ?Pulmonary:  ?   Effort: No respiratory distress.  ?   Breath sounds: No stridor. No wheezing or rhonchi.  ?Chest:  ?   Chest wall: No tenderness.  ?Abdominal:  ?   General: Abdomen is flat. Bowel sounds are normal.  ?   Palpations: Abdomen is soft. There is no mass.  ?   Tenderness: There is no abdominal tenderness.  ?Musculoskeletal:  ?   Cervical back: Normal range of motion and neck supple. No rigidity or tenderness.  ?   Left lower leg: No edema.  ?Skin: ?  General: Skin is warm and dry.  ?Neurological:  ?   Mental Status: He is alert.  ? ? ?Results for orders placed or performed in visit on 11/27/21  ?Rapid Strep screen([facility])  ? Specimen: Other  ? Other  ?Result Value Ref Range  ? Strep Gp A Ag, IA W/Reflex Negative Negative  ?Culture, Group A Strep  ? Other  ?Result Value Ref Range  ? Strep A Culture WILL FOLLOW   ?Veritor Flu A/B Waived  ?Result Value Ref Range  ? Influenza A Negative Negative  ? Influenza B Negative Negative  ? ?   ? ? ?Current Outpatient Medications:  ?  allopurinol (ZYLOPRIM) 100 MG tablet, Take 1 tablet (100 mg total) by mouth daily., Disp: 90 tablet, Rfl: 1 ?  amLODipine-benazepril (LOTREL) 5-20 MG capsule, TAKE 2 CAPSULES ONCE EACH DAY, Disp: 180 capsule, Rfl: 1 ?  aspirin EC 81 MG tablet, Take 81 mg by mouth daily., Disp: , Rfl:  ?   benzonatate (TESSALON) 100 MG capsule, Take 1 capsule (100 mg total) by mouth 2 (two) times daily as needed for cough., Disp: 20 capsule, Rfl: 0 ?  dapagliflozin propanediol (FARXIGA) 10 MG TABS tablet, Take 1 tablet (10 mg total) by mouth daily., Disp: 90 tablet, Rfl: 1 ?  fexofenadine (ALLEGRA ALLERGY) 180 MG tablet, Take 1 tablet (180 mg total) by mouth daily., Disp: 10 tablet, Rfl: 1 ?  fluticasone (FLONASE) 50 MCG/ACT nasal spray, Place 2 sprays into both nostrils daily., Disp: 16 g, Rfl: 6 ?  glucose blood test strip, 1 each by Other route as needed for other. Use contour next test strip to check sugar 2 times daily, Disp: 200 each, Rfl: 4 ?  lovastatin (MEVACOR) 40 MG tablet, Take 1 tablet (40 mg total) by mouth daily., Disp: 90 tablet, Rfl: 1 ?  metFORMIN (GLUCOPHAGE-XR) 500 MG 24 hr tablet, Take 2 tablets (1,000 mg total) by mouth 2 (two) times daily., Disp: 360 tablet, Rfl: 1 ?  Omega-3 Fatty Acids (FISH OIL PO), Take 1 capsule by mouth daily., Disp: , Rfl:  ?  pioglitazone (ACTOS) 45 MG tablet, Take 1 tablet (45 mg total) by mouth daily., Disp: 90 tablet, Rfl: 1 ?  Semaglutide (RYBELSUS) 14 MG TABS, Take 14 mg by mouth daily., Disp: 90 tablet, Rfl: 1 ?  sildenafil (REVATIO) 20 MG tablet, Take 1-5 tablets (20-100 mg total) by mouth as needed. (Patient taking differently: Take 80-100 mg by mouth daily as needed (ED).), Disp: 50 tablet, Rfl: 12 ?  tamsulosin (FLOMAX) 0.4 MG CAPS capsule, Take 1 capsule (0.4 mg total) by mouth in the morning and at bedtime., Disp: 60 capsule, Rfl: 6 ?  timolol (TIMOPTIC) 0.5 % ophthalmic solution, Place 1 drop into both eyes 2 (two) times daily., Disp: , Rfl:  ? ?Current Facility-Administered Medications:  ?  ceFAZolin (ANCEF) 2 g in dextrose 5 % 100 mL IVPB, 2 g, Intravenous, Q8H, Hollice Espy, MD  ? ? ?Assessment & Plan:  ?URI: Flu and strep ordered at this visit, both negative. ? pt advised to take Tylenol q 4- 6 hourly as needed. pt to take allegra q pm as needed and  to call office if symptoms worsened pt verbalised understanding of such. ? ? ?Problem List Items Addressed This Visit   ? ?  ? Respiratory  ? Upper respiratory tract infection  ? Relevant Orders  ? Rapid Strep screen([facility]) (Completed)  ? Veritor Flu A/B Waived (Completed)  ?  ? ?Orders Placed This Encounter  ?  Procedures  ? Rapid Strep screen([facility])  ? Culture, Group A Strep  ? Veritor Flu A/B Waived  ?  ? ?Meds ordered this encounter  ?Medications  ? benzonatate (TESSALON) 100 MG capsule  ?  Sig: Take 1 capsule (100 mg total) by mouth 2 (two) times daily as needed for cough.  ?  Dispense:  20 capsule  ?  Refill:  0  ? fexofenadine (ALLEGRA ALLERGY) 180 MG tablet  ?  Sig: Take 1 tablet (180 mg total) by mouth daily.  ?  Dispense:  10 tablet  ?  Refill:  1  ? fluticasone (FLONASE) 50 MCG/ACT nasal spray  ?  Sig: Place 2 sprays into both nostrils daily.  ?  Dispense:  16 g  ?  Refill:  6  ?  ? ?Follow up plan: ?No follow-ups on file. ? ? ?

## 2021-11-30 LAB — CULTURE, GROUP A STREP: Strep A Culture: NEGATIVE

## 2021-11-30 LAB — VERITOR FLU A/B WAIVED
Influenza A: NEGATIVE
Influenza B: NEGATIVE

## 2021-11-30 LAB — RAPID STREP SCREEN (MED CTR MEBANE ONLY): Strep Gp A Ag, IA W/Reflex: NEGATIVE

## 2021-12-02 NOTE — Progress Notes (Signed)
Please let pt know this was normal.

## 2021-12-12 ENCOUNTER — Other Ambulatory Visit: Payer: Self-pay | Admitting: Family Medicine

## 2021-12-12 DIAGNOSIS — E118 Type 2 diabetes mellitus with unspecified complications: Secondary | ICD-10-CM

## 2021-12-12 DIAGNOSIS — T560X1D Toxic effect of lead and its compounds, accidental (unintentional), subsequent encounter: Secondary | ICD-10-CM

## 2021-12-12 NOTE — Telephone Encounter (Signed)
Medication: metFORMIN (GLUCOPHAGE-XR) 500 MG 24 hr tablet [[id number]]  ? ?Has the patient contacted their pharmacy? YES  ?(Agent: If no, request that the patient contact the pharmacy for the refill. If patient does not wish to contact the pharmacy document the reason why and proceed with request.) ?(Agent: If yes, when and what did the pharmacy advise?) ? ?Preferred Pharmacy (with phone number or street name): [facility], [place] - [place] ?[place] [address] ?Phone: [phone] Fax: [phone] ?Hours: Not open 24 hours ? ? ?Has the patient been seen for an appointment in the last year OR does the patient have an upcoming appointment? YES 11/27/21 ? ?Agent: Please be advised that RX refills may take up to 3 business days. We ask that you follow-up with your pharmacy. ?

## 2021-12-13 ENCOUNTER — Telehealth: Payer: Self-pay | Admitting: Family Medicine

## 2021-12-13 ENCOUNTER — Other Ambulatory Visit: Payer: Self-pay | Admitting: Family Medicine

## 2021-12-13 DIAGNOSIS — E118 Type 2 diabetes mellitus with unspecified complications: Secondary | ICD-10-CM

## 2021-12-13 MED ORDER — METFORMIN HCL ER 500 MG PO TB24: 1000.0000 mg | ORAL_TABLET | Freq: Two times a day (BID) | ORAL | 6 refills | Status: DC

## 2021-12-13 MED ORDER — METFORMIN HCL ER 500 MG PO TB24: 1000.0000 mg | ORAL_TABLET | Freq: Two times a day (BID) | ORAL | 1 refills | Status: DC

## 2021-12-13 NOTE — Telephone Encounter (Signed)
Pt returning call. Pt expressed understanding regarding the medication being sent to his local pharmacy. ?

## 2021-12-13 NOTE — Telephone Encounter (Signed)
. ?Requested Prescriptions  ?Pending Prescriptions Disp Refills  ?? metFORMIN (GLUCOPHAGE-XR) 500 MG 24 hr tablet 360 tablet 1  ?  Sig: Take 2 tablets (1,000 mg total) by mouth 2 (two) times daily.  ?  ? Endocrinology:  Diabetes - Biguanides Failed - 12/12/2021  3:01 PM  ?  ?  Failed - B12 Level in normal range and within 720 days  ?  No results found for: VITAMINB12   ?  ?  Passed - Cr in normal range and within 360 days  ?  Creatinine, Ser  ?Date Value Ref Range Status  ?08/13/2021 1.15 0.76 - 1.27 mg/dL Final  ?   ?  ?  Passed - HBA1C is between 0 and 7.9 and within 180 days  ?  HB A1C (BAYER DCA - WAIVED)  ?Date Value Ref Range Status  ?11/13/2021 7.0 (H) 4.8 - 5.6 % Final  ?  Comment:  ?           Prediabetes: 5.7 - 6.4 ?         Diabetes: >6.4 ?         Glycemic control for adults with diabetes: <7.0 ?  ?   ?  ?  Passed - eGFR in normal range and within 360 days  ?  GFR calc Af Amer  ?Date Value Ref Range Status  ?07/31/2020 80 >59 mL/min/1.73 Final  ?  Comment:  ?  **In accordance with recommendations from the NKF-ASN Task force,** ?  [facility] is in the process of updating its eGFR calculation to the ?  2021 CKD-EPI creatinine equation that estimates kidney function ?  without a race variable. ?  ? ?GFR calc non Af Amer  ?Date Value Ref Range Status  ?07/31/2020 69 >59 mL/min/1.73 Final  ? ?eGFR  ?Date Value Ref Range Status  ?08/13/2021 68 >59 mL/min/1.73 Final  ?   ?  ?  Passed - Valid encounter within last 6 months  ?  Recent Outpatient Visits   ?      ? 2 weeks ago Upper respiratory tract infection, unspecified type  ? [facility] Vigg, Avanti, MD  ? 1 month ago Controlled type 2 diabetes mellitus with complication, without long-term current use of insulin ([place])  ? [place], [place] P, DO  ? 2 months ago Upper respiratory tract infection, unspecified type  ? [place], NP  ? 4 months ago Controlled type 2 diabetes mellitus with  complication, without long-term current use of insulin ([place])  ? [place], [place] P, DO  ? 4 months ago Biceps tendinitis of left upper extremity  ? [place], [place] P, DO  ?  ?  ?Future Appointments   ?        ? In 2 months Wynetta Emery, Barb Merino, DO [place]  ? In 8 months Ralene Bathe, MD [place]  ? In 9 months Hollice Espy, MD [place]  ?  ? ?  ?  ?  Passed - CBC within normal limits and completed in the last 12 months  ?  WBC  ?Date Value Ref Range Status  ?08/13/2021 3.7 3.4 - 10.8 x10E3/uL Final  ?03/11/2019 4.0 4.0 - 10.5 K/uL Final  ? ?RBC  ?Date Value Ref Range Status  ?08/13/2021 4.04 (L) 4.14 - 5.80 x10E6/uL Final  ?03/11/2019 4.43 4.22 - 5.81 MIL/uL Final  ? ?Hemoglobin  ?Date Value Ref Range  Status  ?08/13/2021 13.0 13.0 - 17.7 g/dL Final  ? ?Hematocrit  ?Date Value Ref Range Status  ?08/13/2021 39.5 37.5 - 51.0 % Final  ? ?MCHC  ?Date Value Ref Range Status  ?08/13/2021 32.9 31.5 - 35.7 g/dL Final  ?03/11/2019 34.5 30.0 - 36.0 g/dL Final  ? ?MCH  ?Date Value Ref Range Status  ?08/13/2021 32.2 26.6 - 33.0 pg Final  ?03/11/2019 32.7 26.0 - 34.0 pg Final  ? ?MCV  ?Date Value Ref Range Status  ?08/13/2021 98 (H) 79 - 97 fL Final  ? ?No results found for: PLTCOUNTKUC, LABPLAT, [place] ?RDW  ?Date Value Ref Range Status  ?08/13/2021 13.3 11.6 - 15.4 % Final  ? ?  ?  ?  ? ?

## 2021-12-13 NOTE — Telephone Encounter (Signed)
[facility] ?

## 2021-12-13 NOTE — Telephone Encounter (Signed)
Left message for to inform patient that Dr.Johnson has sent over patients prescription his local pharmacy. Advised patient to give our office a call back if he has any questions or concerns.  ?

## 2021-12-13 NOTE — Telephone Encounter (Signed)
Requested Prescriptions  ?Pending Prescriptions Disp Refills  ?? allopurinol (ZYLOPRIM) 100 MG tablet [Pharmacy Med Name: ALLOPURINOL TABS '100MG'$ ] 90 tablet 1  ?  Sig: TAKE 1 TABLET DAILY  ?  ? Endocrinology:  Gout Agents - allopurinol Passed - 12/12/2021  8:05 PM  ?  ?  Passed - Uric Acid in normal range and within 360 days  ?  Uric Acid  ?Date Value Ref Range Status  ?08/13/2021 5.8 3.8 - 8.4 mg/dL Final  ?  Comment:  ?             Therapeutic target for gout patients: <6.0  ?   ?  ?  Passed - Cr in normal range and within 360 days  ?  Creatinine, Ser  ?Date Value Ref Range Status  ?08/13/2021 1.15 0.76 - 1.27 mg/dL Final  ?   ?  ?  Passed - Valid encounter within last 12 months  ?  Recent Outpatient Visits   ?      ? 2 weeks ago Upper respiratory tract infection, unspecified type  ? [facility] Vigg, Avanti, MD  ? 1 month ago Controlled type 2 diabetes mellitus with complication, without long-term current use of insulin ([place])  ? [place], [place] P, DO  ? 2 months ago Upper respiratory tract infection, unspecified type  ? [place], NP  ? 4 months ago Controlled type 2 diabetes mellitus with complication, without long-term current use of insulin ([place])  ? [place], [place] P, DO  ? 4 months ago Biceps tendinitis of left upper extremity  ? [place], [place] P, DO  ?  ?  ?Future Appointments   ?        ? In 2 months Wynetta Emery, Barb Merino, DO [place]  ? In 8 months Ralene Bathe, MD [place]  ? In 9 months Hollice Espy, MD [place]  ?  ? ?  ?  ?  Passed - CBC within normal limits and completed in the last 12 months  ?  WBC  ?Date Value Ref Range Status  ?08/13/2021 3.7 3.4 - 10.8 x10E3/uL Final  ?03/11/2019 4.0 4.0 - 10.5 K/uL Final  ? ?RBC  ?Date Value Ref Range Status  ?08/13/2021 4.04 (L) 4.14 - 5.80 x10E6/uL Final  ?03/11/2019 4.43 4.22 - 5.81  MIL/uL Final  ? ?Hemoglobin  ?Date Value Ref Range Status  ?08/13/2021 13.0 13.0 - 17.7 g/dL Final  ? ?Hematocrit  ?Date Value Ref Range Status  ?08/13/2021 39.5 37.5 - 51.0 % Final  ? ?MCHC  ?Date Value Ref Range Status  ?08/13/2021 32.9 31.5 - 35.7 g/dL Final  ?03/11/2019 34.5 30.0 - 36.0 g/dL Final  ? ?MCH  ?Date Value Ref Range Status  ?08/13/2021 32.2 26.6 - 33.0 pg Final  ?03/11/2019 32.7 26.0 - 34.0 pg Final  ? ?MCV  ?Date Value Ref Range Status  ?08/13/2021 98 (H) 79 - 97 fL Final  ? ?No results found for: PLTCOUNTKUC, LABPLAT, [place] ?RDW  ?Date Value Ref Range Status  ?08/13/2021 13.3 11.6 - 15.4 % Final  ? ?  ?  ?  ? ?

## 2021-12-13 NOTE — Telephone Encounter (Signed)
Patients wife came in the office stating that the patient needs a medication refill and that he took hi last pill of Metformin this morning. She brought in a note explaining what is going [facility]. Note was placed in the providers folder. Please advise  ?

## 2021-12-16 DIAGNOSIS — R059 Cough, unspecified: Secondary | ICD-10-CM | POA: Diagnosis not present

## 2021-12-16 DIAGNOSIS — Z20822 Contact with and (suspected) exposure to covid-19: Secondary | ICD-10-CM | POA: Diagnosis not present

## 2021-12-16 DIAGNOSIS — R051 Acute cough: Secondary | ICD-10-CM | POA: Diagnosis not present

## 2021-12-17 DIAGNOSIS — H35372 Puckering of macula, left eye: Secondary | ICD-10-CM | POA: Diagnosis not present

## 2021-12-17 DIAGNOSIS — H34832 Tributary (branch) retinal vein occlusion, left eye, with macular edema: Secondary | ICD-10-CM | POA: Diagnosis not present

## 2021-12-17 DIAGNOSIS — H43813 Vitreous degeneration, bilateral: Secondary | ICD-10-CM | POA: Diagnosis not present

## 2021-12-17 DIAGNOSIS — E113212 Type 2 diabetes mellitus with mild nonproliferative diabetic retinopathy with macular edema, left eye: Secondary | ICD-10-CM | POA: Diagnosis not present

## 2021-12-24 ENCOUNTER — Telehealth: Payer: Self-pay | Admitting: Family Medicine

## 2021-12-24 NOTE — Telephone Encounter (Signed)
Copied from [place] [id number]. Topic: General - Other ?>> Dec 24, 2021 11:39 AM Yvette Rack wrote: ?Reason for CRM: Pt reports that he does not need the Rx for amLODipine-benazepril (LOTREL) 5-20 MG capsule to be sent to [facility] ?

## 2021-12-24 NOTE — Telephone Encounter (Signed)
Patient spouse came into the office and stated that he was out of Amlodipine and he would like a [facility]. Please call patient to notify this has been done. ?

## 2021-12-30 DIAGNOSIS — Z20822 Contact with and (suspected) exposure to covid-19: Secondary | ICD-10-CM | POA: Diagnosis not present

## 2021-12-31 ENCOUNTER — Other Ambulatory Visit: Payer: Self-pay

## 2021-12-31 DIAGNOSIS — Z20822 Contact with and (suspected) exposure to covid-19: Secondary | ICD-10-CM | POA: Diagnosis not present

## 2021-12-31 MED ORDER — RYBELSUS 14 MG PO TABS: 14.0000 mg | ORAL_TABLET | Freq: Every day | ORAL | 1 refills | Status: DC

## 2022-01-20 DIAGNOSIS — Z20822 Contact with and (suspected) exposure to covid-19: Secondary | ICD-10-CM | POA: Diagnosis not present

## 2022-01-23 ENCOUNTER — Inpatient Hospital Stay: Payer: Medicare Other | Attending: Internal Medicine

## 2022-01-23 ENCOUNTER — Telehealth: Payer: Self-pay | Admitting: Family Medicine

## 2022-01-23 DIAGNOSIS — I10 Essential (primary) hypertension: Secondary | ICD-10-CM

## 2022-01-23 DIAGNOSIS — C61 Malignant neoplasm of prostate: Secondary | ICD-10-CM | POA: Insufficient documentation

## 2022-01-23 LAB — PSA: Prostatic Specific Antigen: 0.03 ng/mL (ref 0.00–4.00)

## 2022-01-23 MED ORDER — AMLODIPINE BESY-BENAZEPRIL HCL 5-20 MG PO CAPS: ORAL_CAPSULE | ORAL | 0 refills | Status: DC

## 2022-01-23 NOTE — Telephone Encounter (Signed)
Patient came in to the office and stated that he needs at least a 2 weeks refill on amLODipine-benazepril (LOTREL) 5-20 MG capsule [14822] until his medication comes in the mail. Please [facility]. Call patient when sent. ?

## 2022-01-30 ENCOUNTER — Ambulatory Visit: Payer: Medicare Other | Admitting: Radiation Oncology

## 2022-02-03 ENCOUNTER — Encounter: Payer: Self-pay | Admitting: Radiation Oncology

## 2022-02-03 ENCOUNTER — Ambulatory Visit
Admission: RE | Admit: 2022-02-03 | Discharge: 2022-02-03 | Disposition: A | Discharge status: Home / Self Care | Payer: Medicare Other | Source: Ambulatory Visit | Attending: Radiation Oncology | Admitting: Radiation Oncology

## 2022-02-03 VITALS — BP 121/64 | HR 72 | Temp 98.1°F | Resp 16 | Ht 70.0 in | Wt 202.2 lb

## 2022-02-03 DIAGNOSIS — R350 Frequency of micturition: Secondary | ICD-10-CM | POA: Diagnosis not present

## 2022-02-03 DIAGNOSIS — Z7982 Long term (current) use of aspirin: Secondary | ICD-10-CM | POA: Diagnosis not present

## 2022-02-03 DIAGNOSIS — C61 Malignant neoplasm of prostate: Secondary | ICD-10-CM

## 2022-02-03 DIAGNOSIS — K625 Hemorrhage of anus and rectum: Secondary | ICD-10-CM | POA: Insufficient documentation

## 2022-02-03 DIAGNOSIS — Z08 Encounter for follow-up examination after completed treatment for malignant neoplasm: Secondary | ICD-10-CM | POA: Diagnosis not present

## 2022-02-03 DIAGNOSIS — Z923 Personal history of irradiation: Secondary | ICD-10-CM | POA: Diagnosis not present

## 2022-02-03 DIAGNOSIS — R351 Nocturia: Secondary | ICD-10-CM | POA: Insufficient documentation

## 2022-02-03 NOTE — Progress Notes (Signed)
Radiation Oncology Follow up Note  Name: Troy Christensen   Date:   02/03/2022 MRN:  [id number] DOB: 11-Mar-1951    This 71 y.o. male presents to the clinic today for 62-monthfollow-up status post I-125 interstitial implant for salvage and patient treated 6 years prior with IMRT radiation therapy for stage IIb Gleason 8 (4+4) adenocarcinoma presenting with a PSA of 6.3.  REFERRING PROVIDER: JValerie Roys DO  HPI: Patient is a 71year old male now out 10 months having completed salvage radiation therapy using I-125 interstitial implant in the patient who is previously treated with IMRT radiation therapy 6 years prior for stage IIb Gleason 8 adenocarcinoma.  Seen today in routine follow-up he is doing fair.  He complains of increasing urinary frequency although he is having nocturia x3 he states he almost goes hourly during the day he is on Flomax twice a day..Marland Kitchen He is also been having occasional rectal bleeding is taking 81 mg of aspirin every day which have asked him to cut in half.  His most recent PSA continues to decline is now 0.03 down from 0.07's 6 months prior  COMPLICATIONS OF TREATMENT: none  FOLLOW UP COMPLIANCE: keeps appointments   PHYSICAL EXAM:  BP 121/64 (BP Location: Left Arm, Patient Position: Sitting, Cuff Size: Normal)   Pulse 72   Temp 98.1 F (36.7 C) (Tympanic)   Resp 16   Ht '5\' 10"'$  (1.778 m) Comment: stated Ht.  Wt 202 lb 3.2 oz (91.7 kg)   BMI 29.01 kg/m  Well-developed well-nourished patient in NAD. HEENT reveals PERLA, EOMI, discs not visualized.  Oral cavity is clear. No oral mucosal lesions are identified. Neck is clear without evidence of cervical or supraclavicular adenopathy. Lungs are clear to A&P. Cardiac examination is essentially unremarkable with regular rate and rhythm without murmur rub or thrill. Abdomen is benign with no organomegaly or masses noted. Motor sensory and DTR levels are equal and symmetric in the upper and lower extremities. Cranial  nerves II through XII are grossly intact. Proprioception is intact. No peripheral adenopathy or edema is identified. No motor or sensory levels are noted. Crude visual fields are within normal range.  RADIOLOGY RESULTS: No current films for review  PLAN: The fact the patient has been treated with salvage again understandably he is having some increased lower urinary tract symptoms such as frequency during the day.  He is currently on Flomax x2.  I am referring him back to urology for work-up to aid in some of his urinary symptoms.  I have otherwise asked to see him back in 6 months for follow-up.  He mains under excellent biochemical control of his disease at this time.  I would like to take this opportunity to thank you for allowing me to participate in the care of your patient..Noreene Filbert MD

## 2022-02-04 ENCOUNTER — Other Ambulatory Visit: Payer: Self-pay | Admitting: *Deleted

## 2022-02-04 MED ORDER — OXYBUTYNIN CHLORIDE ER 10 MG PO TB24: 10.0000 mg | ORAL_TABLET | Freq: Every day | ORAL | 2 refills | Status: DC

## 2022-02-04 MED ORDER — MIRABEGRON ER 50 MG PO TB24: 50.0000 mg | ORAL_TABLET | Freq: Every day | ORAL | 2 refills | Status: DC

## 2022-02-05 DIAGNOSIS — H43813 Vitreous degeneration, bilateral: Secondary | ICD-10-CM | POA: Diagnosis not present

## 2022-02-05 DIAGNOSIS — H35372 Puckering of macula, left eye: Secondary | ICD-10-CM | POA: Diagnosis not present

## 2022-02-05 DIAGNOSIS — H43821 Vitreomacular adhesion, right eye: Secondary | ICD-10-CM | POA: Diagnosis not present

## 2022-02-05 DIAGNOSIS — E113212 Type 2 diabetes mellitus with mild nonproliferative diabetic retinopathy with macular edema, left eye: Secondary | ICD-10-CM | POA: Diagnosis not present

## 2022-02-05 DIAGNOSIS — H34832 Tributary (branch) retinal vein occlusion, left eye, with macular edema: Secondary | ICD-10-CM | POA: Diagnosis not present

## 2022-02-09 ENCOUNTER — Other Ambulatory Visit: Payer: Self-pay | Admitting: Family Medicine

## 2022-02-09 DIAGNOSIS — E7800 Pure hypercholesterolemia, unspecified: Secondary | ICD-10-CM

## 2022-02-11 NOTE — Telephone Encounter (Signed)
Requested Prescriptions  Pending Prescriptions Disp Refills  . lovastatin (MEVACOR) 40 MG tablet [Pharmacy Med Name: LOVASTATIN TABS '40MG'$ ] 90 tablet 3    Sig: TAKE 1 TABLET DAILY     Cardiovascular:  Antilipid - Statins 2 Failed - 02/09/2022  2:01 PM      Failed - Lipid Panel in normal range within the last 12 months    Cholesterol, Total  Date Value Ref Range Status  08/13/2021 162 100 - 199 mg/dL Final   Cholesterol Piccolo, Waived  Date Value Ref Range Status  06/09/2019 172 <200 mg/dL Final    Comment:                            Desirable                <200                         Borderline High      200- 239                         High                     >239    LDL Chol Calc (NIH)  Date Value Ref Range Status  08/13/2021 80 0 - 99 mg/dL Final   HDL  Date Value Ref Range Status  08/13/2021 57 >39 mg/dL Final   Triglycerides  Date Value Ref Range Status  08/13/2021 144 0 - 149 mg/dL Final   Triglycerides Piccolo,Waived  Date Value Ref Range Status  06/09/2019 342 (H) <150 mg/dL Final    Comment:                            Normal                   <150                         Borderline High     150 - 199                         High                200 - 499                         Very High                >499          Passed - Cr in normal range and within 360 days    Creatinine, Ser  Date Value Ref Range Status  08/13/2021 1.15 0.76 - 1.27 mg/dL Final         Passed - Patient is not pregnant      Passed - Valid encounter within last 12 months    Recent Outpatient Visits          2 months ago Upper respiratory tract infection, unspecified type   [facility] Vigg, Avanti, MD   3 months ago Controlled type 2 diabetes mellitus with complication, without long-term current use of insulin ([place])   [place], [name] P, DO   4 months ago Upper respiratory tract infection,  unspecified type   [facility], Lauren A, NP   6 months ago Controlled type 2 diabetes mellitus with complication, without long-term current use of insulin ([place])   [place], [name] P, DO   6 months ago Biceps tendinitis of left upper extremity   [facility] Valerie Roys, DO      Future Appointments            In 2 days Valerie Roys, DO [place], [place]   In 1 week Hollice Espy, MD [place]   In 6 months Ralene Bathe, MD [place]   In 7 months Hollice Espy, MD [place]

## 2022-02-13 ENCOUNTER — Encounter: Payer: Self-pay | Admitting: Family Medicine

## 2022-02-13 ENCOUNTER — Ambulatory Visit (INDEPENDENT_AMBULATORY_CARE_PROVIDER_SITE_OTHER): Payer: Medicare Other | Admitting: Family Medicine

## 2022-02-13 VITALS — BP 100/61 | HR 56 | Temp 97.5°F | Wt 196.8 lb

## 2022-02-13 DIAGNOSIS — E785 Hyperlipidemia, unspecified: Secondary | ICD-10-CM

## 2022-02-13 DIAGNOSIS — R3911 Hesitancy of micturition: Secondary | ICD-10-CM

## 2022-02-13 DIAGNOSIS — T560X1D Toxic effect of lead and its compounds, accidental (unintentional), subsequent encounter: Secondary | ICD-10-CM

## 2022-02-13 DIAGNOSIS — C61 Malignant neoplasm of prostate: Secondary | ICD-10-CM | POA: Diagnosis not present

## 2022-02-13 DIAGNOSIS — I10 Essential (primary) hypertension: Secondary | ICD-10-CM

## 2022-02-13 DIAGNOSIS — E118 Type 2 diabetes mellitus with unspecified complications: Secondary | ICD-10-CM | POA: Diagnosis not present

## 2022-02-13 DIAGNOSIS — I700 Atherosclerosis of aorta: Secondary | ICD-10-CM | POA: Diagnosis not present

## 2022-02-13 DIAGNOSIS — M1A1790 Lead-induced chronic gout, unspecified ankle and foot, without tophus (tophi): Secondary | ICD-10-CM | POA: Diagnosis not present

## 2022-02-13 LAB — MICROALBUMIN, URINE WAIVED
Creatinine, Urine Waived: 100 mg/dL (ref 10–300)
Microalb, Ur Waived: 80 mg/L — ABNORMAL HIGH (ref 0–19)

## 2022-02-13 LAB — URINALYSIS, ROUTINE W REFLEX MICROSCOPIC
Bilirubin, UA: NEGATIVE
Ketones, UA: NEGATIVE
Leukocytes,UA: NEGATIVE
Nitrite, UA: NEGATIVE
Protein,UA: NEGATIVE
RBC, UA: NEGATIVE
Specific Gravity, UA: 1.02 (ref 1.005–1.030)
Urobilinogen, Ur: 0.2 mg/dL (ref 0.2–1.0)
pH, UA: 5.5 (ref 5.0–7.5)

## 2022-02-13 LAB — BAYER DCA HB A1C WAIVED: HB A1C (BAYER DCA - WAIVED): 7.2 % — ABNORMAL HIGH (ref 4.8–5.6)

## 2022-02-13 MED ORDER — PIOGLITAZONE HCL 45 MG PO TABS: 45.0000 mg | ORAL_TABLET | Freq: Every day | ORAL | 1 refills | Status: DC

## 2022-02-13 MED ORDER — METFORMIN HCL ER 500 MG PO TB24: 1000.0000 mg | ORAL_TABLET | Freq: Two times a day (BID) | ORAL | 1 refills | Status: DC

## 2022-02-13 MED ORDER — ALLOPURINOL 100 MG PO TABS: 100.0000 mg | ORAL_TABLET | Freq: Every day | ORAL | 1 refills | Status: DC

## 2022-02-13 MED ORDER — RYBELSUS 14 MG PO TABS: 14.0000 mg | ORAL_TABLET | Freq: Every day | ORAL | 1 refills | Status: DC

## 2022-02-13 MED ORDER — AMLODIPINE BESY-BENAZEPRIL HCL 5-20 MG PO CAPS: ORAL_CAPSULE | ORAL | 1 refills | Status: DC

## 2022-02-13 NOTE — Assessment & Plan Note (Signed)
Will keep BP and cholesterol under good control. Continue current regimen. Continue to monitor. Call with any concerns.  

## 2022-02-13 NOTE — Progress Notes (Signed)
BP 100/61   Pulse (!) 56   Temp (!) 97.5 F (36.4 C)   Wt 196 lb 12.8 oz (89.3 kg)   SpO2 98%   BMI 28.24 kg/m    Subjective:    Patient ID: Troy Christensen, male    DOB: Jul 17, 1951, 71 y.o.   MRN: [id number]  HPI: Troy Christensen is a 71 y.o. male  Chief Complaint  Patient presents with   Hypertension   Hyperlipidemia   Diabetes   DIABETES Hypoglycemic episodes:no Polydipsia/polyuria: no Visual disturbance: no Chest pain: no Paresthesias: no Glucose Monitoring: yes  Accucheck frequency: Daily Taking Insulin?: no Blood Pressure Monitoring: a few times a month Retinal Examination: Up to Date Foot Exam: Up to Date Diabetic Education: Completed Pneumovax: Up to Date Influenza: Up to Date Aspirin: yes  HYPERTENSION / HYPERLIPIDEMIA Satisfied with current treatment? yes Duration of hypertension: chronic BP monitoring frequency: few times a  month BP medication side effects: no Past BP meds: amlodipine- benazepril Duration of hyperlipidemia: chronic Cholesterol medication side effects: no Cholesterol supplements: fish oil Past cholesterol medications: lovastatin Medication compliance: excellent compliance Aspirin: yes Recent stressors: no Recurrent headaches: no Visual changes: no Palpitations: no Dyspnea: no Chest pain: no Lower extremity edema: no Dizzy/lightheaded: no  No gout flares. Tolerating medicine well. No concerns.   Relevant past medical, surgical, family and social history reviewed and updated as indicated. Interim medical history since our last visit reviewed. Allergies and medications reviewed and updated.  Review of Systems  Constitutional: Negative.   Respiratory: Negative.    Cardiovascular: Negative.   Gastrointestinal: Negative.   Musculoskeletal: Negative.   Neurological: Negative.   Psychiatric/Behavioral: Negative.     Per HPI unless specifically indicated above     Objective:    BP 100/61   Pulse (!) 56   Temp (!)  97.5 F (36.4 C)   Wt 196 lb 12.8 oz (89.3 kg)   SpO2 98%   BMI 28.24 kg/m   Wt Readings from Last 3 Encounters:  02/13/22 196 lb 12.8 oz (89.3 kg)  02/03/22 202 lb 3.2 oz (91.7 kg)  11/27/21 198 lb (89.8 kg)    Physical Exam Vitals and nursing note reviewed.  Constitutional:      General: He is not in acute distress.    Appearance: Normal appearance. He is obese. He is not ill-appearing, toxic-appearing or diaphoretic.  HENT:     Head: Normocephalic and atraumatic.     Right Ear: External ear normal.     Left Ear: External ear normal.     Nose: Nose normal.     Mouth/Throat:     Mouth: Mucous membranes are moist.     Pharynx: Oropharynx is clear.  Eyes:     General: No scleral icterus.       Right eye: No discharge.        Left eye: No discharge.     Extraocular Movements: Extraocular movements intact.     Conjunctiva/sclera: Conjunctivae normal.     Pupils: Pupils are equal, round, and reactive to light.  Cardiovascular:     Rate and Rhythm: Normal rate and regular rhythm.     Pulses: Normal pulses.     Heart sounds: Normal heart sounds. No murmur heard.   No friction rub. No gallop.  Pulmonary:     Effort: Pulmonary effort is normal. No respiratory distress.     Breath sounds: Normal breath sounds. No stridor. No wheezing, rhonchi or rales.  Chest:  Chest wall: No tenderness.  Musculoskeletal:        General: Normal range of motion.     Cervical back: Normal range of motion and neck supple.  Skin:    General: Skin is warm and dry.     Capillary Refill: Capillary refill takes less than 2 seconds.     Coloration: Skin is not jaundiced or pale.     Findings: No bruising, erythema, lesion or rash.  Neurological:     General: No focal deficit present.     Mental Status: He is alert and oriented to person, place, and time. Mental status is at baseline.  Psychiatric:        Mood and Affect: Mood normal.        Behavior: Behavior normal.        Thought Content:  Thought content normal.        Judgment: Judgment normal.    Results for orders placed or performed in visit on 01/23/22  PSA  Result Value Ref Range   Prostatic Specific Antigen 0.03 0.00 - 4.00 ng/mL      Assessment & Plan:   Problem List Items Addressed This Visit       Cardiovascular and Mediastinum   Hypertension - Primary    Under good control on current regimen. Continue current regimen. Continue to monitor. Call with any concerns. Refills given. Labs drawn today.         Relevant Medications   amLODipine-benazepril (LOTREL) 5-20 MG capsule   Other Relevant Orders   Comprehensive metabolic panel   CBC with Differential/Platelet   TSH   Urinalysis, Routine w reflex microscopic   Aortic atherosclerosis (HCC)    Will keep BP and cholesterol under good control. Continue current regimen. Continue to monitor. Call with any concerns.        Relevant Medications   amLODipine-benazepril (LOTREL) 5-20 MG capsule   Other Relevant Orders   Comprehensive metabolic panel   CBC with Differential/Platelet   TSH   Urinalysis, Routine w reflex microscopic     Endocrine   Controlled diabetes mellitus type 2 with complications ([place])    Doing well with A1c of 7.2. Continue current regimen. Continue to monitor. Call with any concerns. Refills given.        Relevant Medications   dapagliflozin propanediol (FARXIGA) 10 MG TABS tablet   metFORMIN (GLUCOPHAGE-XR) 500 MG 24 hr tablet   amLODipine-benazepril (LOTREL) 5-20 MG capsule   pioglitazone (ACTOS) 45 MG tablet   Semaglutide (RYBELSUS) 14 MG TABS   Other Relevant Orders   Comprehensive metabolic panel   CBC with Differential/Platelet   TSH   Urinalysis, Routine w reflex microscopic   Microalbumin, Urine Waived     Genitourinary   Prostate cancer ([place])    Continue to follow with oncology. Call with any concerns. Continue to monitor.        Relevant Medications   allopurinol (ZYLOPRIM) 100 MG tablet     Other    Hyperlipidemia    Under good control on current regimen. Continue current regimen. Continue to monitor. Call with any concerns. Refills given. Labs drawn today.        Relevant Medications   amLODipine-benazepril (LOTREL) 5-20 MG capsule   Other Relevant Orders   Comprehensive metabolic panel   CBC with Differential/Platelet   Lipid Panel w/o Chol/HDL Ratio   TSH   Urinalysis, Routine w reflex microscopic   Bayer DCA Hb A1c Waived   Gout    Under good  control on current regimen. Continue current regimen. Continue to monitor. Call with any concerns. Refills given. Labs drawn today.        Relevant Medications   allopurinol (ZYLOPRIM) 100 MG tablet   Other Relevant Orders   Comprehensive metabolic panel   CBC with Differential/Platelet   TSH   Urinalysis, Routine w reflex microscopic   Uric acid   Other Visit Diagnoses     Hesitancy       Labs drawn today. Await results.    Relevant Orders   Comprehensive metabolic panel   CBC with Differential/Platelet   PSA   Essential hypertension       Relevant Medications   amLODipine-benazepril (LOTREL) 5-20 MG capsule        Follow up plan: Return in about 3 months (around 05/16/2022).

## 2022-02-13 NOTE — Assessment & Plan Note (Signed)
Under good control on current regimen. Continue current regimen. Continue to monitor. Call with any concerns. Refills given. Labs drawn today.   

## 2022-02-13 NOTE — Assessment & Plan Note (Addendum)
Doing well with A1c of 7.2. Continue current regimen. Continue to monitor. Call with any concerns. Refills given.

## 2022-02-13 NOTE — Assessment & Plan Note (Signed)
Continue to follow with oncology. Call with any concerns. Continue to monitor.  

## 2022-02-14 LAB — COMPREHENSIVE METABOLIC PANEL
ALT: 15 IU/L (ref 0–44)
AST: 21 IU/L (ref 0–40)
Albumin/Globulin Ratio: 2.4 — ABNORMAL HIGH (ref 1.2–2.2)
Albumin: 4.7 g/dL (ref 3.8–4.8)
Alkaline Phosphatase: 64 IU/L (ref 44–121)
BUN/Creatinine Ratio: 18 (ref 10–24)
BUN: 17 mg/dL (ref 8–27)
Bilirubin Total: 0.3 mg/dL (ref 0.0–1.2)
CO2: 23 mmol/L (ref 20–29)
Calcium: 10.1 mg/dL (ref 8.6–10.2)
Chloride: 100 mmol/L (ref 96–106)
Creatinine, Ser: 0.95 mg/dL (ref 0.76–1.27)
Globulin, Total: 2 g/dL (ref 1.5–4.5)
Glucose: 134 mg/dL — ABNORMAL HIGH (ref 70–99)
Potassium: 4.5 mmol/L (ref 3.5–5.2)
Sodium: 138 mmol/L (ref 134–144)
Total Protein: 6.7 g/dL (ref 6.0–8.5)
eGFR: 86 mL/min/{1.73_m2} (ref >59)

## 2022-02-14 LAB — CBC WITH DIFFERENTIAL/PLATELET
Basophils Absolute: 0 10*3/uL (ref 0.0–0.2)
Basos: 1 %
EOS (ABSOLUTE): 0.2 10*3/uL (ref 0.0–0.4)
Eos: 5 %
Hematocrit: 37.3 % — ABNORMAL LOW (ref 37.5–51.0)
Hemoglobin: 13 g/dL (ref 13.0–17.7)
Immature Grans (Abs): 0 10*3/uL (ref 0.0–0.1)
Immature Granulocytes: 0 %
Lymphocytes Absolute: 1.1 10*3/uL (ref 0.7–3.1)
Lymphs: 29 %
MCH: 33.7 pg — ABNORMAL HIGH (ref 26.6–33.0)
MCHC: 34.9 g/dL (ref 31.5–35.7)
MCV: 97 fL (ref 79–97)
Monocytes Absolute: 0.3 10*3/uL (ref 0.1–0.9)
Monocytes: 9 %
Neutrophils Absolute: 2 10*3/uL (ref 1.4–7.0)
Neutrophils: 56 %
Platelets: 199 10*3/uL (ref 150–450)
RBC: 3.86 x10E6/uL — ABNORMAL LOW (ref 4.14–5.80)
RDW: 12.9 % (ref 11.6–15.4)
WBC: 3.7 10*3/uL (ref 3.4–10.8)

## 2022-02-14 LAB — LIPID PANEL W/O CHOL/HDL RATIO
Cholesterol, Total: 160 mg/dL (ref 100–199)
HDL: 61 mg/dL (ref >39)
LDL Chol Calc (NIH): 69 mg/dL (ref 0–99)
Triglycerides: 184 mg/dL — ABNORMAL HIGH (ref 0–149)
VLDL Cholesterol Cal: 30 mg/dL (ref 5–40)

## 2022-02-14 LAB — TSH: TSH: 1.76 u[IU]/mL (ref 0.450–4.500)

## 2022-02-14 LAB — PSA: Prostate Specific Ag, Serum: <0.1 ng/mL (ref 0.0–4.0)

## 2022-02-14 LAB — URIC ACID: Uric Acid: 5.5 mg/dL (ref 3.8–8.4)

## 2022-02-18 ENCOUNTER — Encounter: Payer: Self-pay | Admitting: Urology

## 2022-02-18 ENCOUNTER — Ambulatory Visit (INDEPENDENT_AMBULATORY_CARE_PROVIDER_SITE_OTHER): Payer: Medicare Other | Admitting: Urology

## 2022-02-18 VITALS — BP 133/71 | HR 62 | Ht 70.0 in | Wt 196.0 lb

## 2022-02-18 DIAGNOSIS — C61 Malignant neoplasm of prostate: Secondary | ICD-10-CM

## 2022-02-18 DIAGNOSIS — Z8546 Personal history of malignant neoplasm of prostate: Secondary | ICD-10-CM | POA: Diagnosis not present

## 2022-02-18 DIAGNOSIS — R339 Retention of urine, unspecified: Secondary | ICD-10-CM

## 2022-02-18 LAB — URINALYSIS, COMPLETE
Bilirubin, UA: NEGATIVE
Ketones, UA: NEGATIVE
Leukocytes,UA: NEGATIVE
Nitrite, UA: NEGATIVE
Protein,UA: NEGATIVE
RBC, UA: NEGATIVE
Specific Gravity, UA: 1.02 (ref 1.005–1.030)
Urobilinogen, Ur: 0.2 mg/dL (ref 0.2–1.0)
pH, UA: 5.5 (ref 5.0–7.5)

## 2022-02-18 LAB — MICROSCOPIC EXAMINATION
Bacteria, UA: NONE SEEN
RBC, Urine: NONE SEEN /hpf (ref 0–2)

## 2022-02-18 LAB — BLADDER SCAN AMB NON-IMAGING: Scan Result: 61

## 2022-02-18 MED ORDER — OXYBUTYNIN CHLORIDE ER 15 MG PO TB24: 15.0000 mg | ORAL_TABLET | Freq: Every day | ORAL | 11 refills | Status: DC

## 2022-02-18 NOTE — Progress Notes (Signed)
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   He was diagnosed with  stage II prostate cancer approximately 6 years ago. Status post biopsy adenocarcinoma Gleason score was 4+4. Patient underwent radiation. He thinks he might have received 1 Lupron injection at the time of the radiation. As per the patient there were some concerns for cost also.   Patient has been monitored closely by radiation oncology and consistent PSA checks.  His PSA had risen to 2.03 prompting further staging.  He underwent salvage brachytherapy on 03/25/2021 and who developed postoperative urinary retention requiring Foley placement and clot retention requiring catheter irrigation.  His most recent PSA was undetectable on 02/13/2022.  He reports that he was taking Flomax twice a day. He had mild improvement on oxybutynin 10 mg XL which was started recently by Dr. Donella Stade. His symptoms are mostly a daytime issue, primarily urgency/ frequency.  He is a diabetic.    IPSS 15 and PVR of 61 ml.    IPSS     Row Name 02/18/22 0900         International Prostate Symptom Score   How often have you had the sensation of not emptying your bladder? Not at All     How often have you had to urinate less than every two hours? Almost always     How often have you found you stopped and started again several times when you urinated? Less than 1 in 5 times     How often have you found it difficult to postpone urination? Almost always     How often have you had a weak urinary stream? Less than 1 in 5 times     How often have you had to strain to start urination? Not at All     How  many times did you typically get up at night to urinate? 3 Times     Total IPSS Score 15       Quality of Life due to urinary symptoms   If you were to spend the rest of your life with your urinary condition just the way it is now how would you feel about that? Mostly Disatisfied              Score:  1-7 Mild 8-19 Moderate 20-35 Severe  PMH: Past Medical History:  Diagnosis Date   Cancer ([place])    Prostate   Diabetes mellitus without complication ([place])    Glaucoma    Gout    Hypercholesteremia    Hypertension    Osteoarthritis    hands   Prostate cancer [facility])     Surgical History: Past Surgical History:  Procedure Laterality Date   EYE SURGERY Bilateral    march and april 2021- [place]    FLEXIBLE SIGMOIDOSCOPY N/A 01/10/2016   Procedure: FLEXIBLE SIGMOIDOSCOPY with  argon plasma coagulation;  Surgeon: Lucilla Lame, MD;  Location: [facility];  Service: Endoscopy;  Laterality: N/A;  Diabetic - oral meds   HERNIA REPAIR     JOINT REPLACEMENT  2014   Right Knee Replacement   MENISCUS REPAIR Left  nephrolithiasis     pt denies   PROSTATE SURGERY     Radiation treatments - no surgery   RADIOACTIVE SEED IMPLANT N/A 03/25/2021   Procedure: RADIOACTIVE SEED IMPLANT/BRACHYTHERAPY IMPLANT;  Surgeon: Hollice Espy, MD;  Location: [facility];  Service: Urology;  Laterality: N/A;   RETINAL DETACHMENT SURGERY Bilateral    One surgery in March 2021, and Second in April 2021   [place] Right 2012    Home Medications:  Allergies as of 02/18/2022   No Known Allergies      Medication List        Accurate as of February 18, 2022  9:22 AM. If you have any questions, ask your nurse or doctor.          allopurinol 100 MG tablet Commonly known as: ZYLOPRIM Take 1 tablet (100 mg total) by mouth daily.   amLODipine-benazepril 5-20 MG capsule Commonly known as: LOTREL TAKE 2 CAPSULES ONCE EACH DAY   aspirin EC 81 MG tablet Take 81 mg by mouth daily.    dapagliflozin propanediol 10 MG Tabs tablet Commonly known as: FARXIGA Take by mouth daily.   FISH OIL PO Take 1 capsule by mouth daily.   glucose blood test strip 1 each by Other route as needed for other. Use contour next test strip to check sugar 2 times daily   lovastatin 40 MG tablet Commonly known as: MEVACOR TAKE 1 TABLET DAILY   metFORMIN 500 MG 24 hr tablet Commonly known as: GLUCOPHAGE-XR Take 2 tablets (1,000 mg total) by mouth 2 (two) times daily.   oxybutynin 10 MG 24 hr tablet Commonly known as: Ditropan XL Take 1 tablet (10 mg total) by mouth at bedtime.   pioglitazone 45 MG tablet Commonly known as: ACTOS Take 1 tablet (45 mg total) by mouth daily.   Rybelsus 14 MG Tabs Generic drug: Semaglutide Take 1 tablet (14 mg total) by mouth daily.   sildenafil 20 MG tablet Commonly known as: REVATIO Take 1-5 tablets (20-100 mg total) by mouth as needed. What changed:  how much to take when to take this reasons to take this   tamsulosin 0.4 MG Caps capsule Commonly known as: FLOMAX Take 1 capsule (0.4 mg total) by mouth in the morning and at bedtime.   timolol 0.5 % ophthalmic solution Commonly known as: TIMOPTIC Place 1 drop into both eyes 2 (two) times daily.        Allergies:  No Known Allergies  Family History: Family History  Problem Relation Age of Onset   Cancer Father 66       lung   Emphysema Mother    Dementia Mother    Cancer Brother        melanoma    Social History:  reports that he quit smoking about 46 years ago. His smoking use included cigarettes. He has never used smokeless tobacco. He reports that he does not drink alcohol and does not use drugs.   Physical Exam: BP 133/71   Pulse 62   Ht '5\' 10"'$  (1.778 m)   Wt 196 lb (88.9 kg)   BMI 28.12 kg/m   Constitutional:  Alert and oriented, No acute distress. HEENT: [place] AT, moist mucus membranes.  Trachea midline, no masses. Cardiovascular: No clubbing, cyanosis, or  edema. Respiratory: Normal respiratory effort, no increased work of breathing. Skin: No rashes, bruises or suspicious lesions. Neurologic: Grossly intact, no focal deficits, moving all 4 extremities. Psychiatric: Normal mood and affect.  Laboratory Data:  Lab  Results  Component Value Date   CREATININE 0.95 02/13/2022   Lab Results  Component Value Date   HGBA1C 7.2 (H) 02/13/2022    Pertinent Imaging: Results for orders placed or performed in visit on 02/18/22  Bladder Scan (Post Void Residual) in office  Result Value Ref Range   Scan Result 61     Assessment & Plan:   Urinary frequency/ urgency - Increase oxybutynin to 15 mg XL given some slight improvement with the lower dose, okay to continue the last 2 weeks of his 10 mg dose and then titrate up -Continue Flomax - UA pending  -Adequate bladder emptying, not contributing factor - Will have him follow-up with Debroah Loop, PA-C, in 2 months  -If this is not effective, may consider the addition of beta 3 agonist or consider cystoscopy for consideration of outlet procedure and/or rule out other underlying issues  2. Prostate cancer  - Managed by radiation oncology    F/u 2 mo IPSS/ PVR  I,Kailey Littlejohn,acting as a scribe for Hollice Espy, MD.,have documented all relevant documentation on the behalf of Hollice Espy, MD,as directed by  Hollice Espy, MD while in the presence of Hollice Espy, MD.  I have reviewed the above documentation for accuracy and completeness, and I agree with the above.   Hollice Espy, MD   [facility] [address], [place], [place] [address] [phone]

## 2022-02-20 ENCOUNTER — Telehealth: Payer: Self-pay | Admitting: Family Medicine

## 2022-02-20 ENCOUNTER — Telehealth: Payer: Self-pay | Admitting: *Deleted

## 2022-02-20 NOTE — Telephone Encounter (Signed)
Pt's urologist did UA on pt and advised his sugar was high in his urine. Pt declined to speak w/ anyone else, he wants to speak w/ Dr Troy Christensen.  He says she may want to re check herself, but he prefers to wait until she gets back.

## 2022-02-20 NOTE — Telephone Encounter (Addendum)
Patient informed, voiced understanding  ----- Message from Hollice Espy, MD sent at 02/18/2022  1:38 PM EDT ----- Let this patient know that his urine looked fine, there is a lot of sugar in his urine which is a likely result of his diabetes medications.  He may consider discussing with his primary care physician if his diabetic medications may be exacerbating his urinary symptoms and if there is alternatives.   Hollice Espy, MD

## 2022-02-24 NOTE — Telephone Encounter (Signed)
He's on farxiga. That is normal.

## 2022-02-24 NOTE — Telephone Encounter (Signed)
Per Dr.Johnson, patient was made aware of her recommendations. Patient states he will reports the information back to Dr.Brandon's with Urology office. Patient was advised to give our office a call back if he has any other questions or concerns. Verbalized understanding.

## 2022-03-19 DIAGNOSIS — H43813 Vitreous degeneration, bilateral: Secondary | ICD-10-CM | POA: Diagnosis not present

## 2022-03-19 DIAGNOSIS — H35372 Puckering of macula, left eye: Secondary | ICD-10-CM | POA: Diagnosis not present

## 2022-03-19 DIAGNOSIS — H34832 Tributary (branch) retinal vein occlusion, left eye, with macular edema: Secondary | ICD-10-CM | POA: Diagnosis not present

## 2022-03-19 DIAGNOSIS — E113212 Type 2 diabetes mellitus with mild nonproliferative diabetic retinopathy with macular edema, left eye: Secondary | ICD-10-CM | POA: Diagnosis not present

## 2022-04-10 ENCOUNTER — Other Ambulatory Visit: Payer: Self-pay | Admitting: Family Medicine

## 2022-04-10 NOTE — Telephone Encounter (Signed)
Requested medication (s) are due for refill today: yes  Requested medication (s) are on the active medication list: yes  Last refill:  12/31/21  Future visit scheduled: yes  Notes to clinic:  historical med. Please advise      Requested Prescriptions  Pending Prescriptions Disp Refills   FARXIGA 10 MG TABS tablet [Pharmacy Med Name: FARXIGA TABS 10MG] 90 tablet 3    Sig: TAKE 1 TABLET DAILY     Endocrinology:  Diabetes - SGLT2 Inhibitors Passed - 04/10/2022  3:20 AM      Passed - Cr in normal range and within 360 days    Creatinine, Ser  Date Value Ref Range Status  02/13/2022 0.95 0.76 - 1.27 mg/dL Final         Passed - HBA1C is between 0 and 7.9 and within 180 days    HB A1C (BAYER DCA - WAIVED)  Date Value Ref Range Status  02/13/2022 7.2 (H) 4.8 - 5.6 % Final    Comment:             Prediabetes: 5.7 - 6.4          Diabetes: >6.4          Glycemic control for adults with diabetes: <7.0          Passed - eGFR in normal range and within 360 days    GFR calc Af Amer  Date Value Ref Range Status  07/31/2020 80 >59 mL/min/1.73 Final    Comment:    **In accordance with recommendations from the NKF-ASN Task force,**   [facility] is in the process of updating its eGFR calculation to the   2021 CKD-EPI creatinine equation that estimates kidney function   without a race variable.    GFR calc non Af Amer  Date Value Ref Range Status  07/31/2020 69 >59 mL/min/1.73 Final   eGFR  Date Value Ref Range Status  02/13/2022 86 >59 mL/min/1.73 Final         Passed - Valid encounter within last 6 months    Recent Outpatient Visits           1 month ago Primary hypertension   [facility] [place], [name] P, DO   4 months ago Upper respiratory tract infection, unspecified type   [facility], [place], MD   4 months ago Controlled type 2 diabetes mellitus with complication, without long-term current use of insulin ([place])   [place], [name] P, DO   6 months ago Upper respiratory tract infection, unspecified type   [place], Lauren A, NP   8 months ago Controlled type 2 diabetes mellitus with complication, without long-term current use of insulin [facility])   [place], [place], DO       Future Appointments             In 1 month Wynetta Emery, Barb Merino, DO [employer], [place]   In 4 months Nehemiah Massed, Monia Sabal, MD [place]   In 5 months Hollice Espy, MD [place]

## 2022-04-11 ENCOUNTER — Other Ambulatory Visit: Payer: Self-pay | Admitting: Family Medicine

## 2022-04-21 ENCOUNTER — Ambulatory Visit (INDEPENDENT_AMBULATORY_CARE_PROVIDER_SITE_OTHER): Payer: Medicare Other | Admitting: Physician Assistant

## 2022-04-21 VITALS — BP 109/64 | HR 62 | Ht 70.0 in | Wt 203.0 lb

## 2022-04-21 DIAGNOSIS — R3915 Urgency of urination: Secondary | ICD-10-CM | POA: Diagnosis not present

## 2022-04-21 LAB — BLADDER SCAN AMB NON-IMAGING

## 2022-04-21 NOTE — Progress Notes (Signed)
04/21/2022 10:05 AM   Troy Christensen 1950-09-30 [id number]  CC: Chief Complaint  Patient presents with   Follow-up   HPI: Troy Christensen is a 71 y.o. 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  He is accompanied today by his wife, who contributes to HPI.  Today he reports doing well.  He is tolerating the oxybutynin without constipation, dry mouth, or dry eye.  He is primarily bothered by nocturia x2-3.  Overall, he is satisfied with his medication regimen and does not wish to make any changes.  IPSS 4/mostly satisfied as below, previously 15/mostly dissatisfied. PVR 111m.   IPSS     Row Name 04/21/22 1000         International Prostate Symptom Score   How often have you had the sensation of not emptying your bladder? Not at All     How often have you had to urinate less than every two hours? Less than 1 in 5 times     How often have you found you stopped and started again several times when you urinated? Not at All     How often have you found it difficult to postpone urination? Not at All     How often have you had a weak urinary stream? Not at All     How often have you had to strain to start urination? Not at All     How many times did you typically get up at night to urinate? 3 Times     Total IPSS Score 4       Quality of Life due to urinary symptoms   If you were to spend the rest of your life with your urinary condition just the way it is now how would you feel about that? Mostly Satisfied               PMH: Past Medical History:  Diagnosis Date   [place]    Prostate   Diabetes mellitus without complication ([place]    Glaucoma    Gout    Hypercholesteremia    Hypertension    Osteoarthritis    hands    Prostate [facility] ([facility]     Surgical History: Past Surgical History:  Procedure Laterality Date   EYE SURGERY Bilateral    march and april 2021- [place]    FLEXIBLE SIGMOIDOSCOPY N/A 01/10/2016   Procedure: FLEXIBLE SIGMOIDOSCOPY with  argon plasma coagulation;  Surgeon: DLucilla Lame MD;  [place]  Service: Endoscopy;  Laterality: N/A;  Diabetic - oral meds   HERNIA REPAIR     JOINT REPLACEMENT  2014   Right Knee Replacement   MENISCUS REPAIR Left    nephrolithiasis     pt denies   PROSTATE SURGERY     Radiation treatments - no surgery   RADIOACTIVE SEED IMPLANT N/A 03/25/2021   Procedure: RADIOACTIVE SEED IMPLANT/BRACHYTHERAPY IMPLANT;  Surgeon: BHollice Espy MD;  Location: [facility];  Service: Urology;  Laterality: N/A;   RETINAL DETACHMENT SURGERY Bilateral    One surgery in March 2021, and Second in April 2021   [place] 2012    Home Medications:  Allergies as of 04/21/2022   No Known Allergies      Medication List  Accurate as of April 21, 2022 10:05 AM. If you have any questions, ask your nurse or doctor.          allopurinol 100 MG tablet Commonly known as: ZYLOPRIM Take 1 tablet (100 mg total) by mouth daily.   amLODipine-benazepril 5-20 MG capsule Commonly known as: LOTREL TAKE 2 CAPSULES ONCE EACH DAY   aspirin EC 81 MG tablet Take 81 mg by mouth daily.   Farxiga 10 MG Tabs tablet Generic drug: dapagliflozin propanediol TAKE 1 TABLET DAILY   FISH OIL PO Take 1 capsule by mouth daily.   glucose blood test strip 1 each by Other route as needed for other. Use contour next test strip to check sugar 2 times daily   lovastatin 40 MG tablet Commonly known as: MEVACOR TAKE 1 TABLET DAILY   metFORMIN 500 MG 24 hr tablet Commonly known as: GLUCOPHAGE-XR Take 2 tablets (1,000 mg total) by mouth 2 (two) times daily.   oxybutynin 15 MG 24 hr tablet Commonly known as: DITROPAN XL Take 1 tablet (15 mg  total) by mouth daily.   pioglitazone 45 MG tablet Commonly known as: ACTOS Take 1 tablet (45 mg total) by mouth daily.   Rybelsus 14 MG Tabs Generic drug: Semaglutide Take 1 tablet (14 mg total) by mouth daily.   sildenafil 20 MG tablet Commonly known as: REVATIO Take 1-5 tablets (20-100 mg total) by mouth as needed. What changed:  how much to take when to take this reasons to take this   tamsulosin 0.4 MG Caps capsule Commonly known as: FLOMAX Take 1 capsule (0.4 mg total) by mouth in the morning and at bedtime.   timolol 0.5 % ophthalmic solution Commonly known as: TIMOPTIC Place 1 drop into both eyes 2 (two) times daily.        Allergies:  No Known Allergies  Family History: Family History  Problem Relation Age of Onset   Cancer Father 28       lung   Emphysema Mother    Dementia Mother    Cancer Brother        melanoma    Social History:   reports that he quit smoking about 47 years ago. His smoking use included cigarettes. He has never used smokeless tobacco. He reports that he does not drink alcohol and does not use drugs.  Physical Exam: There were no vitals taken for this visit.  Constitutional:  Alert and oriented, no acute distress, nontoxic appearing HEENT: [place], AT Cardiovascular: No clubbing, cyanosis, or edema Respiratory: Normal respiratory effort, no increased work of breathing Skin: No rashes, bruises or suspicious lesions Neurologic: Grossly intact, no focal deficits, moving all 4 extremities Psychiatric: Normal mood and affect  Laboratory Data: Results for orders placed or performed in visit on 04/21/22  Bladder Scan (Post Void Residual) in office  Result Value Ref Range   Scan Result 13m    Assessment & Plan:   1. Urinary urgency Symptoms well controlled on Flomax and oxybutynin XL 15 mg daily.  His IPSS reveals mild symptoms, primarily nocturia, and he is emptying appropriately.  We will continue his current regimen without making  any changes today.  We discussed avoiding fluid intake after dinner to help with nocturia. - Bladder Scan (Post Void Residual) in office  Return for as scheduled .  SDebroah Loop PA-C  [facility] [address] [place] [phone]

## 2022-05-14 DIAGNOSIS — H34832 Tributary (branch) retinal vein occlusion, left eye, with macular edema: Secondary | ICD-10-CM | POA: Diagnosis not present

## 2022-05-14 DIAGNOSIS — E113212 Type 2 diabetes mellitus with mild nonproliferative diabetic retinopathy with macular edema, left eye: Secondary | ICD-10-CM | POA: Diagnosis not present

## 2022-05-14 DIAGNOSIS — H35372 Puckering of macula, left eye: Secondary | ICD-10-CM | POA: Diagnosis not present

## 2022-05-14 DIAGNOSIS — H43813 Vitreous degeneration, bilateral: Secondary | ICD-10-CM | POA: Diagnosis not present

## 2022-05-16 ENCOUNTER — Ambulatory Visit: Payer: TRICARE For Life (TFL) | Admitting: Family Medicine

## 2022-05-22 ENCOUNTER — Encounter: Payer: Self-pay | Admitting: Family Medicine

## 2022-05-22 ENCOUNTER — Ambulatory Visit (INDEPENDENT_AMBULATORY_CARE_PROVIDER_SITE_OTHER): Payer: Medicare Other | Admitting: Family Medicine

## 2022-05-22 VITALS — BP 102/60 | HR 56 | Temp 97.7°F | Wt 199.9 lb

## 2022-05-22 DIAGNOSIS — E118 Type 2 diabetes mellitus with unspecified complications: Secondary | ICD-10-CM

## 2022-05-22 LAB — BAYER DCA HB A1C WAIVED: HB A1C (BAYER DCA - WAIVED): 7.8 % — ABNORMAL HIGH (ref 4.8–5.6)

## 2022-05-22 MED ORDER — DAPAGLIFLOZIN PROPANEDIOL 10 MG PO TABS: 10.0000 mg | ORAL_TABLET | Freq: Every day | ORAL | 1 refills | Status: DC

## 2022-05-22 NOTE — Assessment & Plan Note (Signed)
Doing OK with A1c of 7.4. Continue current regimen. Call with any concerns.

## 2022-05-22 NOTE — Progress Notes (Signed)
BP 102/60   Pulse (!) 56   Temp 97.7 F (36.5 C)   Wt 199 lb 14.4 oz (90.7 kg)   SpO2 97%   BMI 28.68 kg/m    Subjective:    Patient ID: Troy Christensen, male    DOB: 1951/03/14, 71 y.o.   MRN: [id number]  HPI: Troy Christensen is a 71 y.o. male  Chief Complaint  Patient presents with   Diabetes   DIABETES Hypoglycemic episodes:no Polydipsia/polyuria: no Visual disturbance: no Chest pain: no Paresthesias: no Glucose Monitoring: yes  Accucheck frequency: Daily Taking Insulin?: no Blood Pressure Monitoring: not checking Retinal Examination: Up to Date Foot Exam: Up to Date Diabetic Education: Completed Pneumovax: Up to Date Influenza:  pending  arrival Aspirin: yes  Relevant past medical, surgical, family and social history reviewed and updated as indicated. Interim medical history since our last visit reviewed. Allergies and medications reviewed and updated.  Review of Systems  Constitutional: Negative.   Respiratory: Negative.    Cardiovascular: Negative.   Gastrointestinal: Negative.   Musculoskeletal: Negative.   Skin: Negative.   Neurological: Negative.   Psychiatric/Behavioral: Negative.      Per HPI unless specifically indicated above     Objective:    BP 102/60   Pulse (!) 56   Temp 97.7 F (36.5 C)   Wt 199 lb 14.4 oz (90.7 kg)   SpO2 97%   BMI 28.68 kg/m   Wt Readings from Last 3 Encounters:  05/22/22 199 lb 14.4 oz (90.7 kg)  04/21/22 203 lb (92.1 kg)  02/18/22 196 lb (88.9 kg)    Physical Exam Vitals and nursing note reviewed.  Constitutional:      General: He is not in acute distress.    Appearance: Normal appearance. He is not ill-appearing, toxic-appearing or diaphoretic.  HENT:     Head: Normocephalic and atraumatic.     Right Ear: External ear normal.     Left Ear: External ear normal.     Nose: Nose normal.     Mouth/Throat:     Mouth: Mucous membranes are moist.     Pharynx: Oropharynx is clear.  Eyes:      General: No scleral icterus.       Right eye: No discharge.        Left eye: No discharge.     Extraocular Movements: Extraocular movements intact.     Conjunctiva/sclera: Conjunctivae normal.     Pupils: Pupils are equal, round, and reactive to light.  Cardiovascular:     Rate and Rhythm: Normal rate and regular rhythm.     Pulses: Normal pulses.     Heart sounds: Normal heart sounds. No murmur heard.    No friction rub. No gallop.  Pulmonary:     Effort: Pulmonary effort is normal. No respiratory distress.     Breath sounds: Normal breath sounds. No stridor. No wheezing, rhonchi or rales.  Chest:     Chest wall: No tenderness.  Musculoskeletal:        General: Normal range of motion.     Cervical back: Normal range of motion and neck supple.  Skin:    General: Skin is warm and dry.     Capillary Refill: Capillary refill takes less than 2 seconds.     Coloration: Skin is not jaundiced or pale.     Findings: No bruising, erythema, lesion or rash.  Neurological:     General: No focal deficit present.     Mental Status:  He is alert and oriented to person, place, and time. Mental status is at baseline.  Psychiatric:        Mood and Affect: Mood normal.        Behavior: Behavior normal.        Thought Content: Thought content normal.        Judgment: Judgment normal.     Results for orders placed or performed in visit on 05/22/22  Bayer DCA Hb A1c Waived  Result Value Ref Range   HB A1C (BAYER DCA - WAIVED) 7.8 (H) 4.8 - 5.6 %      Assessment & Plan:   Problem List Items Addressed This Visit       Endocrine   Controlled diabetes mellitus type 2 with complications ([place]) - Primary    Doing OK with A1c of 7.4. Continue current regimen. Call with any concerns.       Relevant Medications   dapagliflozin propanediol (FARXIGA) 10 MG TABS tablet   Other Relevant Orders   Bayer DCA Hb A1c Waived (Completed)     Follow up plan: Return in about 3 months (around  08/21/2022).

## 2022-06-01 ENCOUNTER — Ambulatory Visit
Admission: EM | Admit: 2022-06-01 | Discharge: 2022-06-01 | Disposition: A | Discharge status: Home / Self Care | Payer: Medicare Other | Attending: Physician Assistant | Admitting: Physician Assistant

## 2022-06-01 DIAGNOSIS — R112 Nausea with vomiting, unspecified: Secondary | ICD-10-CM | POA: Diagnosis not present

## 2022-06-01 DIAGNOSIS — R051 Acute cough: Secondary | ICD-10-CM | POA: Insufficient documentation

## 2022-06-01 DIAGNOSIS — R197 Diarrhea, unspecified: Secondary | ICD-10-CM | POA: Diagnosis not present

## 2022-06-01 DIAGNOSIS — J029 Acute pharyngitis, unspecified: Secondary | ICD-10-CM | POA: Insufficient documentation

## 2022-06-01 DIAGNOSIS — U071 COVID-19: Secondary | ICD-10-CM | POA: Diagnosis not present

## 2022-06-01 LAB — RESP PANEL BY RT-PCR (FLU A&B, COVID) ARPGX2
Influenza A by PCR: NEGATIVE
Influenza B by PCR: NEGATIVE
SARS Coronavirus 2 by RT PCR: POSITIVE — AB

## 2022-06-01 LAB — GROUP A STREP BY PCR: Group A Strep by PCR: NOT DETECTED

## 2022-06-01 MED ORDER — BENZONATATE 200 MG PO CAPS: 200.0000 mg | ORAL_CAPSULE | Freq: Three times a day (TID) | ORAL | 0 refills | Status: DC | PRN

## 2022-06-01 MED ORDER — ONDANSETRON 4 MG PO TBDP: 4.0000 mg | ORAL_TABLET | Freq: Four times a day (QID) | ORAL | 0 refills | Status: DC | PRN

## 2022-06-01 MED ORDER — ONDANSETRON HCL 4 MG/2ML IJ SOLN: 8.0000 mg | Freq: Once | INTRAMUSCULAR | Status: DC

## 2022-06-01 MED ORDER — NIRMATRELVIR/RITONAVIR (PAXLOVID)TABLET: 3.0000 | ORAL_TABLET | Freq: Two times a day (BID) | ORAL | 0 refills | Status: AC

## 2022-06-01 MED ORDER — PROMETHAZINE HCL 25 MG/ML IJ SOLN
25.0000 mg | Freq: Four times a day (QID) | INTRAMUSCULAR | Status: DC | PRN
  Administered 2022-06-01: 25 mg via INTRAMUSCULAR

## 2022-06-01 MED ORDER — PROMETHAZINE HCL 25 MG/ML IJ SOLN: 25.0000 mg | Freq: Once | INTRAMUSCULAR | Status: DC

## 2022-06-01 NOTE — ED Provider Notes (Signed)
[facility]    CSN: [id number] Arrival date & time: 06/01/22  1419      History   Chief Complaint Chief Complaint  Patient presents with   Cough   Diarrhea   Vomiting    HPI Troy Christensen is a 71 y.o. male presenting for 2-day history of sore throat, cough and congestion.  Patient began having nausea and a few episodes of vomiting and several episodes of diarrhea early this morning.  He has not had any fevers, fatigue, body aches.  Denies chest pain or breathing difficulty.  Has not had any abdominal pain.  No sick contacts.  Patient felt similarly when he had COVID-19 in 2020.  He reports that he has either diarrhea or vomiting anytime he consumes any foods.  He has been vaccinated for COVID-19 x3.  Medical history significant for prostate cancer, diabetes, hypertension, hyperlipidemia.  HPI  Past Medical History:  Diagnosis Date   Cancer [facility])    Prostate   Diabetes mellitus without complication ([place])    Glaucoma    Gout    Hypercholesteremia    Hypertension    Osteoarthritis    hands   Prostate cancer [facility])     Patient Active Problem List   Diagnosis Date Noted   Bilateral cataracts 09/24/2021   Family history of malignant melanoma 09/24/2021   Male erectile disorder 09/24/2021   History of colon polyps 09/24/2021   Shoulder pain with history of repair of rotator cuff 09/24/2021   History of total knee replacement, 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   Aortic atherosclerosis ([place]) 12/05/2020   Controlled diabetes mellitus type 2 with complications ([place]) 96/78/9381   COVID-19 03/12/2019   Advanced care planning/counseling discussion 10/15/2017   Prostate cancer ([place]) 07/14/2017   Angiodysplasia of intestine with hemorrhage    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   Glaucoma 11/20/2015   Gout 11/20/2015   Osteoarthritis 03/21/2015   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   Hypertension 03/21/2015   Hyperlipidemia 03/21/2015    Past Surgical History:   Procedure Laterality Date   EYE SURGERY Bilateral    march and april 2021- [place]    FLEXIBLE SIGMOIDOSCOPY N/A 01/10/2016   Procedure: FLEXIBLE SIGMOIDOSCOPY with  argon plasma coagulation;  Surgeon: Lucilla Lame, MD;  Location: [place];  Service: Endoscopy;  Laterality: N/A;  Diabetic - oral meds   HERNIA REPAIR     JOINT REPLACEMENT  2014   Right Knee Replacement   MENISCUS REPAIR Left    nephrolithiasis     pt denies   PROSTATE SURGERY     Radiation treatments - no surgery   RADIOACTIVE SEED IMPLANT N/A 03/25/2021   Procedure: RADIOACTIVE SEED IMPLANT/BRACHYTHERAPY IMPLANT;  Surgeon: Hollice Espy, MD;  Location: [facility];  Service: Urology;  Laterality: N/A;   RETINAL DETACHMENT SURGERY Bilateral    One surgery in March 2021, and Second in April 2021   [place] Right 2012       Home Medications    Prior to Admission medications   Medication Sig Start Date End Date Taking? Authorizing Provider  allopurinol (ZYLOPRIM) 100 MG tablet Take 1 tablet (100 mg total) by mouth daily. 02/13/22  Yes Johnson, [name] P, DO  amLODipine-benazepril (LOTREL) 5-20 MG capsule TAKE 2 CAPSULES ONCE EACH DAY 02/13/22  Yes Johnson, [name] P, DO  aspirin EC 81 MG tablet Take 81 mg by mouth 3 (three) times a week. Pt states 2-3 times weekly   Yes [provider]  benzonatate (TESSALON) 200  MG capsule Take 1 capsule (200 mg total) by mouth 3 (three) times daily as needed for cough. 06/01/22  Yes Laurene Footman B, PA-C  dapagliflozin propanediol (FARXIGA) 10 MG TABS tablet Take 1 tablet (10 mg total) by mouth daily. 05/22/22  Yes Johnson, [name] P, DO  glucose blood test strip 1 each by Other route as needed for other. Use contour next test strip to check sugar 2 times daily 05/20/17  Yes Kathrine Haddock, NP  lovastatin (MEVACOR) 40 MG tablet TAKE 1 TABLET DAILY 02/11/22  Yes Johnson, [name] P, DO  metFORMIN (GLUCOPHAGE-XR) 500 MG 24 hr tablet Take 2 tablets (1,000 mg total) by mouth  2 (two) times daily. 02/13/22  Yes Johnson, [name] P, DO  nirmatrelvir/ritonavir EUA (PAXLOVID) 20 x 150 MG & 10 x '100MG'$  TABS Take 3 tablets by mouth 2 (two) times daily for 5 days. Patient GFR is 86. Take nirmatrelvir (150 mg) two tablets twice daily for 5 days and ritonavir (100 mg) one tablet twice daily for 5 days. 06/01/22 06/06/22 Yes Laurene Footman B, PA-C  Omega-3 Fatty Acids (FISH OIL PO) Take 1 capsule by mouth daily.   Yes [provider]  ondansetron (ZOFRAN-ODT) 4 MG disintegrating tablet Take 1 tablet (4 mg total) by mouth every 6 (six) hours as needed for nausea or vomiting. 06/01/22  Yes Laurene Footman B, PA-C  oxybutynin (DITROPAN XL) 15 MG 24 hr tablet Take 1 tablet (15 mg total) by mouth daily. 02/18/22  Yes Hollice Espy, MD  pioglitazone (ACTOS) 45 MG tablet Take 1 tablet (45 mg total) by mouth daily. 02/13/22  Yes Johnson, [name] P, DO  Semaglutide (RYBELSUS) 14 MG TABS Take 1 tablet (14 mg total) by mouth daily. 02/13/22  Yes Johnson, [name] P, DO  sildenafil (REVATIO) 20 MG tablet Take 1-5 tablets (20-100 mg total) by mouth as needed. Patient taking differently: Take 80-100 mg by mouth daily as needed (ED). 01/30/21  Yes Johnson, [name] P, DO  tamsulosin (FLOMAX) 0.4 MG CAPS capsule Take 1 capsule (0.4 mg total) by mouth in the morning and at bedtime. 11/20/21  Yes Chrystal, Eulas Post, MD  timolol (TIMOPTIC) 0.5 % ophthalmic solution Place 1 drop into both eyes 2 (two) times daily. 01/07/21  Yes [provider]    Family History Family History  Problem Relation Age of Onset   Cancer Father 7       lung   Emphysema Mother    Dementia Mother    Cancer Brother        melanoma    Social History Social History   Tobacco Use   Smoking status: Former    Types: Cigarettes    Quit date: 03/26/1975    Years since quitting: 47.2   Smokeless tobacco: Never  Vaping Use   Vaping Use: Never used  Substance Use Topics   Alcohol use: No    Alcohol/week: 0.0 standard drinks of  alcohol   Drug use: No     Allergies   Patient has no known allergies.   Review of Systems Review of Systems  Constitutional:  Positive for appetite change. Negative for fatigue and fever.  HENT:  Positive for congestion, rhinorrhea and sore throat.   Respiratory:  Positive for cough. Negative for shortness of breath and wheezing.   Cardiovascular:  Negative for chest pain.  Gastrointestinal:  Positive for diarrhea, nausea and vomiting. Negative for abdominal pain.  Musculoskeletal:  Negative for myalgias.  Neurological:  Negative for dizziness, weakness and headaches.  Physical Exam Triage Vital Signs ED Triage Vitals  Enc Vitals Group     BP      Pulse      Resp      Temp      Temp src      SpO2      Weight      Height      Head Circumference      Peak Flow      Pain Score      Pain Loc      Pain Edu?      Excl. in [place]?    No data found.  Updated Vital Signs BP 128/82 (BP Location: Left Arm)   Pulse 95   Temp 98.8 F (37.1 C) (Oral)   Resp 18   SpO2 94%    Physical Exam Vitals and nursing note reviewed.  Constitutional:      General: He is not in acute distress.    Appearance: Normal appearance. He is well-developed. He is ill-appearing (mildly).  HENT:     Head: Normocephalic and atraumatic.     Nose: Congestion present.     Mouth/Throat:     Mouth: Mucous membranes are moist.     Pharynx: Oropharynx is clear. Posterior oropharyngeal erythema present.  Eyes:     General: No scleral icterus.    Conjunctiva/sclera: Conjunctivae normal.  Cardiovascular:     Rate and Rhythm: Normal rate and regular rhythm.     Heart sounds: Normal heart sounds.  Pulmonary:     Effort: Pulmonary effort is normal. No respiratory distress.     Breath sounds: Normal breath sounds. No wheezing, rhonchi or rales.  Abdominal:     Palpations: Abdomen is soft.     Tenderness: There is no abdominal tenderness.  Musculoskeletal:     Cervical back: Neck supple.   Skin:    General: Skin is warm and dry.     Capillary Refill: Capillary refill takes less than 2 seconds.  Neurological:     General: No focal deficit present.     Mental Status: He is alert. Mental status is at baseline.     Motor: No weakness.     Gait: Gait normal.  Psychiatric:        Mood and Affect: Mood normal.        Behavior: Behavior normal.      UC Treatments / Results  Labs (all labs ordered are listed, but only abnormal results are displayed) Labs Reviewed  RESP PANEL BY RT-PCR (FLU A&B, COVID) ARPGX2 - Abnormal; Notable for the following components:      Result Value   SARS Coronavirus 2 by RT PCR POSITIVE (*)    All other components within normal limits  GROUP A STREP BY PCR    EKG   Radiology No results found.  Procedures Procedures (including critical care time)  Medications Ordered in UC Medications  promethazine (PHENERGAN) injection 25 mg (25 mg Intramuscular Given 06/01/22 1528)    Initial Impression / Assessment and Plan / UC Course  I have reviewed the triage vital signs and the nursing notes.  Pertinent labs & imaging results that were available during my care of the patient were reviewed by me and considered in my medical decision making (see chart for details).   71 year old male with history of prostate cancer is presenting for 2-day history of sore throat, nasal congestion and cough.  Reports that early this morning he started to have nausea, vomiting and diarrhea.  He has not had any fevers, weakness, chest pain, breathing difficulty, abdominal pain.  No sick contacts.  No known COVID exposure.  Vaccinated for COVID-19 x3.  Vitals are all normal and stable but he is mildly ill-appearing.  He is nontoxic.  On exam he has nasal congestion and mild posterior pharyngeal erythema.  Chest clear to auscultation and heart regular rate and rhythm.  Abdomen soft and nontender.  During triage he has vomiting.  Patient given 25 mg IM promethazine for  vomiting.  Respiratory panel and PCR strep test obtained. Negative strep and flu. +COVID.   Will reevaluate patient to see if the medication has helped his nausea vomiting over the next hour.  Patient says nausea medication helped and he has not had any more vomiting since he has been here.    Reviewed COVID results with patient.  Current CDC guidelines, isolation protocol and ED precautions with patient.  Advised him to isolate till Thursday.  I did review that he had labs performed 3 months ago and had a normal GFR so I sent Paxlovid and advised him to hold off on taking the statin for 10 days.  Also sent benzonatate and Zofran.  Encouraged him to increase fluids and rest.  Again, reviewed ED precautions.   Final Clinical Impressions(s) / UC Diagnoses   Final diagnoses:  COVID-19  Nausea vomiting and diarrhea  Acute cough  Sore throat     Discharge Instructions      -The COVID test was positive.  You need to isolate until Thursday and then wear a mask for 5 days. - I have sent Paxlovid to the pharmacy.  Make sure you do not take the statin medication for 10 days once you start this.  Also do not take the Viagra medication while you are taking this. - I have sent cough medication to the pharmacy.  You can also take over-the-counter cough medicine. - I have sent Zofran to the pharmacy for nausea and vomiting. -Make sure to increase rest and fluids. - If you have any weakness, shortness of breath or severe symptoms, please go to emergency department for evaluation.     ED Prescriptions     Medication Sig Dispense Auth. Provider   nirmatrelvir/ritonavir EUA (PAXLOVID) 20 x 150 MG & 10 x '100MG'$  TABS Take 3 tablets by mouth 2 (two) times daily for 5 days. Patient GFR is 86. Take nirmatrelvir (150 mg) two tablets twice daily for 5 days and ritonavir (100 mg) one tablet twice daily for 5 days. 30 tablet Laurene Footman B, PA-C   benzonatate (TESSALON) 200 MG capsule Take 1 capsule (200  mg total) by mouth 3 (three) times daily as needed for cough. 30 capsule Laurene Footman B, PA-C   ondansetron (ZOFRAN-ODT) 4 MG disintegrating tablet Take 1 tablet (4 mg total) by mouth every 6 (six) hours as needed for nausea or vomiting. 20 tablet Gretta Cool      PDMP not reviewed this encounter.   Danton Clap, PA-C 06/01/22 1644

## 2022-06-01 NOTE — Discharge Instructions (Signed)
-  The COVID test was positive.  You need to isolate until Thursday and then wear a mask for 5 days. - I have sent Paxlovid to the pharmacy.  Make sure you do not take the statin medication for 10 days once you start this.  Also do not take the Viagra medication while you are taking this. - I have sent cough medication to the pharmacy.  You can also take over-the-counter cough medicine. - I have sent Zofran to the pharmacy for nausea and vomiting. -Make sure to increase rest and fluids. - If you have any weakness, shortness of breath or severe symptoms, please go to emergency department for evaluation.

## 2022-06-01 NOTE — ED Triage Notes (Addendum)
Pt c/o cough, nasal drainage, vomiting, diarrhea, x2day  Pt wife states that everything he eats he either throws up or has diarrhea.

## 2022-06-05 ENCOUNTER — Telehealth: Payer: Self-pay | Admitting: Family Medicine

## 2022-06-05 NOTE — Telephone Encounter (Signed)
Copied from [place] [phone]. Topic: Medicare AWV >> Jun 05, 2022  3:39 PM Josephina Gip wrote: Reason for CRM: Left message for patient to call back to schedule Medicare Annual Wellness Visit   Last AWV  06/07/21  Please schedule at anytime with [facility] if patient calls the office back.      Any questions, please call me at [phone]

## 2022-06-05 NOTE — Telephone Encounter (Signed)
Is there a certain reason this patient was being scheduled with [facility]?

## 2022-06-05 NOTE — Telephone Encounter (Signed)
Pt called to schedule, [facility] is not an option via book it. Please advise

## 2022-06-06 NOTE — Telephone Encounter (Signed)
LVM informing patient that we have an opening Monday 06/09/22 @ 10:00, advised that I would place him in this spot and to call me back directly if that time and day did not work for his schedule. Call back number [phone]  (Original message error-this should be scheduled with [facility])

## 2022-06-09 ENCOUNTER — Ambulatory Visit (INDEPENDENT_AMBULATORY_CARE_PROVIDER_SITE_OTHER): Payer: Medicare Other

## 2022-06-09 ENCOUNTER — Ambulatory Visit (INDEPENDENT_AMBULATORY_CARE_PROVIDER_SITE_OTHER): Payer: Medicare Other | Admitting: *Deleted

## 2022-06-09 DIAGNOSIS — Z23 Encounter for immunization: Secondary | ICD-10-CM | POA: Diagnosis not present

## 2022-06-09 DIAGNOSIS — Z0000 Encounter for general adult medical examination without abnormal findings: Secondary | ICD-10-CM

## 2022-06-09 NOTE — Patient Instructions (Signed)
Troy Christensen , Thank you for taking time to come for your Medicare Wellness Visit. I appreciate your ongoing commitment to your health goals. Please review the following plan we discussed and let me know if I can assist you in the future.   Screening recommendations/referrals: Colonoscopy: Education provided Recommended yearly ophthalmology/optometry visit for glaucoma screening and checkup Recommended yearly dental visit for hygiene and checkup  Vaccinations: Influenza vaccine: up to date Pneumococcal vaccine: up to date Tdap vaccine: up tod ate Shingles vaccine: up to date    Advanced directives: on file    Next appointment: 08-27-2022 @ 8:20 @   [facility] 65 Years and Older, Male Preventive care refers to lifestyle choices and visits with your health care provider that can promote health and wellness. What does preventive care include? A yearly physical exam. This is also called an annual well check. Dental exams once or twice a year. Routine eye exams. Ask your health care provider how often you should have your eyes checked. Personal lifestyle choices, including: Daily care of your teeth and gums. Regular physical activity. Eating a healthy diet. Avoiding tobacco and drug use. Limiting alcohol use. Practicing safe sex. Taking low doses of aspirin every day. Taking vitamin and mineral supplements as recommended by your health care provider. What happens during an annual well check? The services and screenings done by your health care provider during your annual well check will depend on your age, overall health, lifestyle risk factors, and family history of disease. Counseling  Your health care provider may ask you questions about your: Alcohol use. Tobacco use. Drug use. Emotional well-being. Home and relationship well-being. Sexual activity. Eating habits. History of falls. Memory and ability to understand (cognition). Work and work  Statistician. Screening  You may have the following tests or measurements: Height, weight, and BMI. Blood pressure. Lipid and cholesterol levels. These may be checked every 5 years, or more frequently if you are over 97 years old. Skin check. Lung cancer screening. You may have this screening every year starting at age 67 if you have a 30-pack-year history of smoking and currently smoke or have quit within the past 15 years. Fecal occult blood test (FOBT) of the stool. You may have this test every year starting at age 68. Flexible sigmoidoscopy or colonoscopy. You may have a sigmoidoscopy every 5 years or a colonoscopy every 10 years starting at age 61. Prostate cancer screening. Recommendations will vary depending on your family history and other risks. Hepatitis C blood test. Hepatitis B blood test. Sexually transmitted disease (STD) testing. Diabetes screening. This is done by checking your blood sugar (glucose) after you have not eaten for a while (fasting). You may have this done every 1-3 years. Abdominal aortic aneurysm (AAA) screening. You may need this if you are a current or former smoker. Osteoporosis. You may be screened starting at age 61 if you are at high risk. Talk with your health care provider about your test results, treatment options, and if necessary, the need for more tests. Vaccines  Your health care provider may recommend certain vaccines, such as: Influenza vaccine. This is recommended every year. Tetanus, diphtheria, and acellular pertussis (Tdap, Td) vaccine. You may need a Td booster every 10 years. Zoster vaccine. You may need this after age 55. Pneumococcal 13-valent conjugate (PCV13) vaccine. One dose is recommended after age 53. Pneumococcal polysaccharide (PPSV23) vaccine. One dose is recommended after age 82. Talk to your health care provider about which screenings and  vaccines you need and how often you need them. This information is not intended to replace  advice given to you by your health care provider. Make sure you discuss any questions you have with your health care provider. Document Released: 09/28/2015 Document Revised: 05/21/2016 Document Reviewed: 07/03/2015 Elsevier Interactive Patient Education  2017 [place] Prevention in the Home Falls can cause injuries. They can happen to people of all ages. There are many things you can do to make your home safe and to help prevent falls. What can I do on the outside of my home? Regularly fix the edges of walkways and driveways and fix any cracks. Remove anything that might make you trip as you walk through a door, such as a raised step or threshold. Trim any bushes or trees on the path to your home. Use bright outdoor lighting. Clear any walking paths of anything that might make someone trip, such as rocks or tools. Regularly check to see if handrails are loose or broken. Make sure that both sides of any steps have handrails. Any raised decks and porches should have guardrails on the edges. Have any leaves, snow, or ice cleared regularly. Use sand or salt on walking paths during winter. Clean up any spills in your garage right away. This includes oil or grease spills. What can I do in the bathroom? Use night lights. Install grab bars by the toilet and in the tub and shower. Do not use towel bars as grab bars. Use non-skid mats or decals in the tub or shower. If you need to sit down in the shower, use a plastic, non-slip stool. Keep the floor dry. Clean up any water that spills on the floor as soon as it happens. Remove soap buildup in the tub or shower regularly. Attach bath mats securely with double-sided non-slip rug tape. Do not have throw rugs and other things on the floor that can make you trip. What can I do in the bedroom? Use night lights. Make sure that you have a light by your bed that is easy to reach. Do not use any sheets or blankets that are too big for your bed.  They should not hang down onto the floor. Have a firm chair that has side arms. You can use this for support while you get dressed. Do not have throw rugs and other things on the floor that can make you trip. What can I do in the kitchen? Clean up any spills right away. Avoid walking on wet floors. Keep items that you use a lot in easy-to-reach places. If you need to reach something above you, use a strong step stool that has a grab bar. Keep electrical cords out of the way. Do not use floor polish or wax that makes floors slippery. If you must use wax, use non-skid floor wax. Do not have throw rugs and other things on the floor that can make you trip. What can I do with my stairs? Do not leave any items on the stairs. Make sure that there are handrails on both sides of the stairs and use them. Fix handrails that are broken or loose. Make sure that handrails are as long as the stairways. Check any carpeting to make sure that it is firmly attached to the stairs. Fix any carpet that is loose or worn. Avoid having throw rugs at the top or bottom of the stairs. If you do have throw rugs, attach them to the floor with carpet tape. Make  sure that you have a light switch at the top of the stairs and the bottom of the stairs. If you do not have them, ask someone to add them for you. What else can I do to help prevent falls? Wear shoes that: Do not have high heels. Have rubber bottoms. Are comfortable and fit you well. Are closed at the toe. Do not wear sandals. If you use a stepladder: Make sure that it is fully opened. Do not climb a closed stepladder. Make sure that both sides of the stepladder are locked into place. Ask someone to hold it for you, if possible. Clearly mark and make sure that you can see: Any grab bars or handrails. First and last steps. Where the edge of each step is. Use tools that help you move around (mobility aids) if they are needed. These  include: Canes. Walkers. Scooters. Crutches. Turn on the lights when you go into a dark area. Replace any light bulbs as soon as they burn out. Set up your furniture so you have a clear path. Avoid moving your furniture around. If any of your floors are uneven, fix them. If there are any pets around you, be aware of where they are. Review your medicines with your doctor. Some medicines can make you feel dizzy. This can increase your chance of falling. Ask your doctor what other things that you can do to help prevent falls. This information is not intended to replace advice given to you by your health care provider. Make sure you discuss any questions you have with your health care provider. Document Released: 06/28/2009 Document Revised: 02/07/2016 Document Reviewed: 10/06/2014 Elsevier Interactive Patient Education  2017 [employer].

## 2022-06-09 NOTE — Progress Notes (Signed)
Subjective:   Troy Christensen is a 71 y.o. male who presents for Medicare Annual/Subsequent preventive examination.  I connected with  [name] on 06/09/22 by a telephone enabled telemedicine application and verified that I am speaking with the correct person using two identifiers.   I discussed the limitations of evaluation and management by telemedicine. The patient expressed understanding and agreed to proceed.  Patient location: home  Provider location:tele-health-home    Review of Systems     Cardiac Risk Factors include: advanced age (>46mn, >>30women);obesity (BMI >30kg/m2);family history of premature cardiovascular disease;male gender;hypertension;diabetes mellitus     Objective:    Today's Vitals   There is no height or weight on file to calculate BMI.     06/01/2022    3:22 PM 02/03/2022    9:02 AM 08/02/2021    9:08 AM 06/07/2021    4:31 PM 04/30/2021    9:20 AM 03/25/2021    9:53 PM 03/25/2021    6:28 AM  Advanced Directives  Does Patient Have a Medical Advance Directive? Yes Yes No Yes No Yes Yes  Type of [place]amed[place]of [place] will [place]    Living will;Healthcare Power of [place]  Does patient want to make changes to medical advance directive?  No - Patient declined  No - Patient declined   No - Patient declined  Copy of [place]ur[place]in Chart?  No - copy requested     No - copy requested  Would patient like information on creating a medical advance directive?   No - Patient declined  No - Patient declined  No - Patient declined    Current Medications (verified) Outpatient Encounter Medications as of 06/09/2022  Medication Sig   allopurinol (ZYLOPRIM) 100 MG tablet Take 1 tablet (100 mg total) by mouth daily.   amLODipine-benazepril (LOTREL) 5-20 MG capsule TAKE 2 CAPSULES ONCE EACH DAY   aspirin EC 81 MG tablet Take 81 mg by  mouth 3 (three) times a week. Pt states 2-3 times weekly   dapagliflozin propanediol (FARXIGA) 10 MG TABS tablet Take 1 tablet (10 mg total) by mouth daily.   glucose blood test strip 1 each by Other route as needed for other. Use contour next test strip to check sugar 2 times daily   lovastatin (MEVACOR) 40 MG tablet TAKE 1 TABLET DAILY   metFORMIN (GLUCOPHAGE-XR) 500 MG 24 hr tablet Take 2 tablets (1,000 mg total) by mouth 2 (two) times daily.   Omega-3 Fatty Acids (FISH OIL PO) Take 1 capsule by mouth daily.   ondansetron (ZOFRAN-ODT) 4 MG disintegrating tablet Take 1 tablet (4 mg total) by mouth every 6 (six) hours as needed for nausea or vomiting.   oxybutynin (DITROPAN XL) 15 MG 24 hr tablet Take 1 tablet (15 mg total) by mouth daily.   pioglitazone (ACTOS) 45 MG tablet Take 1 tablet (45 mg total) by mouth daily.   Semaglutide (RYBELSUS) 14 MG TABS Take 1 tablet (14 mg total) by mouth daily.   sildenafil (REVATIO) 20 MG tablet Take 1-5 tablets (20-100 mg total) by mouth as needed. (Patient taking differently: Take 80-100 mg by mouth daily as needed (ED).)   tamsulosin (FLOMAX) 0.4 MG CAPS capsule Take 1 capsule (0.4 mg total) by mouth in the morning and at bedtime.   timolol (TIMOPTIC) 0.5 % ophthalmic solution Place 1 drop into both eyes 2 (two) times daily.   [DISCONTINUED]  benzonatate (TESSALON) 200 MG capsule Take 1 capsule (200 mg total) by mouth 3 (three) times daily as needed for cough.   Facility-Administered Encounter Medications as of 06/09/2022  Medication   ceFAZolin (ANCEF) 2 g in dextrose 5 % 100 mL IVPB    Allergies (verified) Patient has no known allergies.   History: Past Medical History:  Diagnosis Date   Cancer ([place])    Prostate   Diabetes mellitus without complication ([place])    Glaucoma    Gout    Hypercholesteremia    Hypertension    Osteoarthritis    hands   Prostate cancer [facility])    Past Surgical History:  Procedure Laterality Date   EYE SURGERY  Bilateral    march and april 2021- [place]    FLEXIBLE SIGMOIDOSCOPY N/A 01/10/2016   Procedure: FLEXIBLE SIGMOIDOSCOPY with  argon plasma coagulation;  Surgeon: Lucilla Lame, MD;  Location: [facility];  Service: Endoscopy;  Laterality: N/A;  Diabetic - oral meds   HERNIA REPAIR     JOINT REPLACEMENT  2014   Right Knee Replacement   MENISCUS REPAIR Left    nephrolithiasis     pt denies   PROSTATE SURGERY     Radiation treatments - no surgery   RADIOACTIVE SEED IMPLANT N/A 03/25/2021   Procedure: RADIOACTIVE SEED IMPLANT/BRACHYTHERAPY IMPLANT;  Surgeon: Hollice Espy, MD;  Location: [facility];  Service: Urology;  Laterality: N/A;   RETINAL DETACHMENT SURGERY Bilateral    One surgery in March 2021, and Second in April 2021   [place] Right 2012   Family History  Problem Relation Age of Onset   Cancer Father 38       lung   Emphysema Mother    Dementia Mother    Cancer Brother        melanoma   Social History   Socioeconomic History   Marital status: Married    Spouse name: Not on file   Number of children: Not on file   Years of education: Not on file   Highest education level: Not on file  Occupational History   Occupation: retired  Tobacco Use   Smoking status: Former    Types: Cigarettes    Quit date: 03/26/1975    Years since quitting: 47.2   Smokeless tobacco: Never  Vaping Use   Vaping Use: Never used  Substance and Sexual Activity   Alcohol use: No    Alcohol/week: 0.0 standard drinks of alcohol   Drug use: No   Sexual activity: Not Currently  Other Topics Concern   Not on file  Social History Narrative   Lives in [place]; with wife; 5 children. 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 Quit smoking 50 years; no alcohol.    Social Determinants of Health   Financial Resource Strain: Low Risk  (06/09/2022)   Overall Financial Resource Strain (CARDIA)    Difficulty of Paying Living Expenses: Not hard at all  Food Insecurity: No  Food Insecurity (06/09/2022)   Hunger Vital Sign    Worried About Running Out of Food in the Last Year: Never true    Ran Out of Food in the Last Year: Never true  Transportation Needs: No Transportation Needs (06/09/2022)   PRAPARE - Hydrologist (Medical): No    Lack of Transportation (Non-Medical): No  Physical Activity: Inactive (06/09/2022)   Exercise Vital Sign    Days of Exercise per Week: 0 days    Minutes of  Exercise per Session: 0 min  Stress: No Stress Concern Present (06/09/2022)   [place]    Feeling of Stress : Not at all  Social Connections: [place] (06/09/2022)   Social Connection and Isolation Panel [NHANES]    Frequency of Communication with Friends and Family: More than three times a week    Frequency of Social Gatherings with Friends and Family: Twice a week    Attends Religious Services: More than 4 times per year    Active Member of [employer] or Organizations: Yes    Attends Music therapist: More than 4 times per year    Marital Status: Married    Tobacco Counseling Counseling given: Not Answered   Clinical Intake:  Pre-visit preparation completed: Yes  Pain : No/denies pain     Diabetes: Yes CBG done?: No Did pt. bring in CBG monitor from home?: No     Diabetic? Yes  Nutrition Risk Assessment:  Has the patient had any N/V/D within the last 2 months?  No  Does the patient have any non-healing wounds?  No  Has the patient had any unintentional weight loss or weight gain?  No   Diabetes:  Is the patient diabetic?  Yes  If diabetic, was a CBG obtained today?  No  Did the patient bring in their glucometer from home?  No  How often do you monitor your CBG's? 2-3 times a week.   Financial Strains and Diabetes Management:  Are you having any financial strains with the device, your supplies or your medication? No .  Does the  patient want to be seen by Chronic Care Management for management of their diabetes?  No  Would the patient like to be referred to a Nutritionist or for Diabetic Management?  No   Diabetic Exams:  Diabetic Eye Exam: Completed . Pt has been advised about the importance in completing this exam Diabetic Foot ExamPt has been advised about the importance in completing this exam. .    Interpreter Needed?: No  Information entered by :: Leroy Kennedy LPN   Activities of Daily Living    06/09/2022   10:18 AM  In your present state of health, do you have any difficulty performing the following activities:  Hearing? 0  Vision? 0  Difficulty concentrating or making decisions? 0  Walking or climbing stairs? 0  Dressing or bathing? 0  Doing errands, shopping? 0  Preparing Food and eating ? N  Using the Toilet? N  In the past six months, have you accidently leaked urine? N  Do you have problems with loss of bowel control? N  Managing your Medications? N  Managing your Finances? N  Housekeeping or managing your Housekeeping? N    Patient Care Team: Valerie Roys, DO as PCP - General (Family Medicine)  Indicate any recent Medical Services you may have received from other than [facility] providers in the past year (date may be approximate).     Assessment:   This is a routine wellness examination for [place].  Hearing/Vision screen Hearing Screening - Comments:: No trouble hearing Vision Screening - Comments:: Up to date  [facility]  Dietary issues and exercise activities discussed: Current Exercise Habits: The patient does not participate in regular exercise at present   Goals Addressed             This Visit's Progress    DIET - [place]   Not on  track    recommend drinking at least 6-8 glasses of water a day       Patient Stated       No goals       Depression Screen    06/09/2022   10:12 AM 02/13/2022    9:23 AM 11/27/2021    3:39 PM 11/13/2021     9:00 AM 08/13/2021    8:17 AM 06/07/2021    4:27 PM 05/15/2021    8:48 AM  PHQ 2/9 Scores  PHQ - 2 Score 0 0 0 0 0 0 0  PHQ- 9 Score 0 0 0 0 0  0    Fall Risk    02/13/2022    9:22 AM 11/27/2021    3:39 PM 11/13/2021    9:00 AM 06/07/2021    4:32 PM 05/15/2021    8:48 AM  Fall Risk   Falls in the past year? 0 0 0 0 0  Number falls in past yr: 0 0 0 0 0  Injury with Fall? 0 0 0 0 0  Risk for fall due to : No Fall Risks No Fall Risks No Fall Risks No Fall Risks No Fall Risks  Follow up Falls evaluation completed Falls evaluation completed Falls evaluation completed Falls evaluation completed Falls evaluation completed    FALL RISK PREVENTION PERTAINING TO THE HOME:  Any stairs in or around the home? No  If so, are there any without handrails? No  Home free of loose throw rugs in walkways, pet beds, electrical cords, etc? Yes  Adequate lighting in your home to reduce risk of falls? Yes   ASSISTIVE DEVICES UTILIZED TO PREVENT FALLS:  Life alert? No  Use of a cane, walker or w/c? No  Grab bars in the bathroom? No  Shower chair or bench in shower? No  Elevated toilet seat or a handicapped toilet? No   TIMED UP AND GO:  Was the test performed? No .   Cognitive Function:        06/09/2022   10:10 AM 06/07/2021    4:32 PM 10/25/2018   11:26 AM 10/15/2017    8:39 AM  6CIT Screen  What Year? 0 points 0 points 0 points 0 points  What month? 0 points 0 points 0 points 0 points  What time? 0 points 0 points 0 points 0 points  Count back from 20 0 points 0 points 0 points 0 points  Months in reverse 0 points 0 points 0 points 0 points  Repeat phrase 0 points 0 points 0 points 0 points  Total Score 0 points 0 points 0 points 0 points    Immunizations Immunization History  Administered Date(s) Administered   Fluad Quad(high Dose 65+) 06/09/2019, 06/21/2020, 06/13/2021, 06/09/2022   Influenza, High Dose Seasonal PF 07/21/2016, 07/17/2017, 06/21/2018   Influenza,inj,Quad PF,6+  Mos 06/26/2015   Influenza,inj,quad, With Preservative 06/15/2016   Influenza-Unspecified 05/17/2019   Moderna Sars-Covid-2 Vaccination 10/27/2019, 11/26/2019   Pneumococcal Conjugate-13 09/24/2016   Pneumococcal Polysaccharide-23 10/15/2017   Td 08/13/2021   Tdap 08/19/2011   Zoster Recombinat (Shingrix) 02/29/2020, 05/01/2020   Zoster, Live 10/05/2013    TDAP status: Up to date  Flu Vaccine status: Up to date  Pneumococcal vaccine status: Up to date  Covid-19 vaccine status: Information provided on how to obtain vaccines.   Qualifies for Shingles Vaccine? No   Zostavax completed Yes   Shingrix Completed?: Yes  Screening Tests Health Maintenance  Topic Date Due   COVID-19 Vaccine (3 -  Moderna risk series) 12/24/2019   FOOT EXAM  06/13/2022   OPHTHALMOLOGY EXAM  07/25/2022   HEMOGLOBIN A1C  11/20/2022   Diabetic kidney evaluation - GFR measurement  02/14/2023   Diabetic kidney evaluation - Urine ACR  02/14/2023   COLONOSCOPY (Pts 45-28yr Insurance coverage will need to be confirmed)  12/20/2024   TETANUS/TDAP  08/14/2031   Pneumonia Vaccine 71 Years old  Completed   INFLUENZA VACCINE  Completed   Hepatitis C Screening  Completed   Zoster Vaccines- Shingrix  Completed   HPV VACCINES  Aged Out    Health Maintenance  Health Maintenance Due  Topic Date Due   COVID-19 Vaccine (3 - Moderna risk series) 12/24/2019    Colonoscopy Declined  Lung Cancer Screening: (Low Dose CT Chest recommended if Age 615-80years, 30 pack-year currently smoking OR have quit w/in 15years.) does not qualify.   Lung Cancer Screening Referral:   Additional Screening:  Hepatitis C Screening: does not qualify; Completed 2019  Vision Screening: Recommended annual ophthalmology exams for early detection of glaucoma and other disorders of the eye. Is the patient up to date with their annual eye exam?  Yes  Who is the provider or what is the name of the office in which the patient attends  annual eye exams? [facility]  If pt is not established with a provider, would they like to be referred to a provider to establish care? No .   Dental Screening: Recommended annual dental exams for proper oral hygiene  Community Resource Referral / Chronic Care Management: CRR required this visit?  No   CCM required this visit?  No      Plan:     I have personally reviewed and noted the following in the patient's chart:   Medical and social history Use of alcohol, tobacco or illicit drugs  Current medications and supplements including opioid prescriptions. Patient is not currently taking opioid prescriptions. Functional ability and status Nutritional status Physical activity Advanced directives List of other physicians Hospitalizations, surgeries, and ER visits in previous 12 months Vitals Screenings to include cognitive, depression, and falls Referrals and appointments  In addition, I have reviewed and discussed with patient certain preventive protocols, quality metrics, and best practice recommendations. A written personalized care plan for preventive services as well as general preventive health recommendations were provided to patient.     JLeroy Kennedy LPN   [id number]  Nurse Notes:

## 2022-06-15 IMAGING — CT NM PET TUM IMG SKULL BASE T - THIGH
9 series · 25 of 25 positions shown · non-contrast
Comparison: CT abdomen 02/19/2019

CLINICAL DATA: Prostate cancer.  Initial staging.

EXAM:
NUCLEAR MEDICINE PET SKULL BASE TO THIGH
TECHNIQUE: 9.3 mCi F18 Piflufolastat (Pylarify) was injected intravenously.
Full-ring PET imaging was performed from the skull base to thigh
after the radiotracer. CT data was obtained and used for attenuation
correction and anatomic localization.

[Series 3: ct wb 5.0 b30f · axial · 5.0mm · 0.98mm/px · z∈[-206,+662]mm · 3 of 290 slices shown]
[im 1/290]
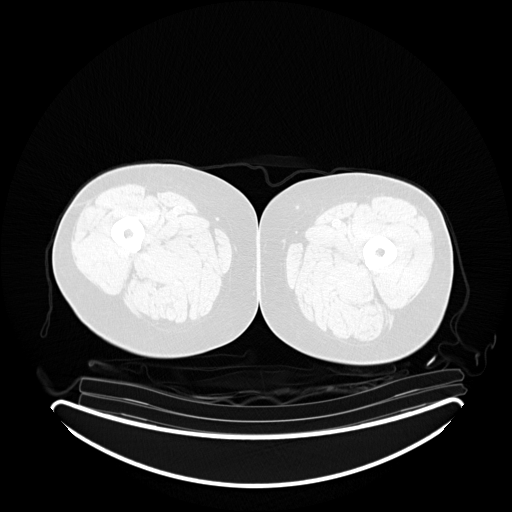
[im 145/290]
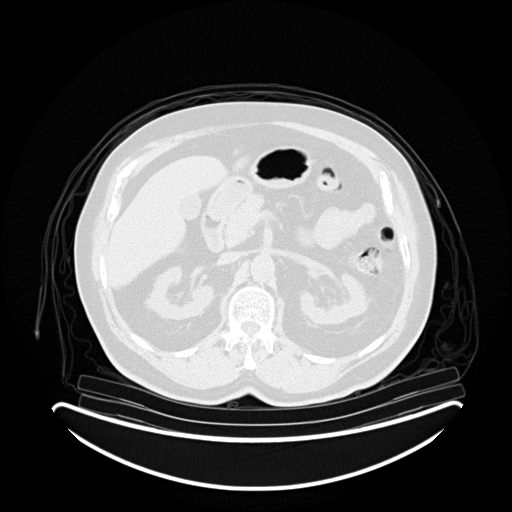
[im 290/290  brain]
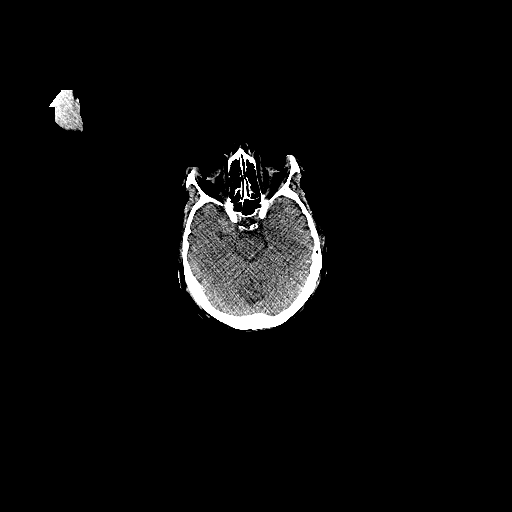

[Series 5: pet wb uncorrected (nac) · axial · 5.0mm · 4.07mm/px · z∈[-206,+662]mm · 3 of 290 slices shown]
[im 1/290]
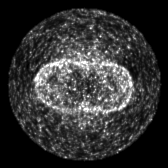
[im 145/290]
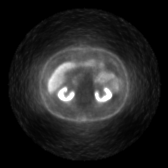
[im 290/290]
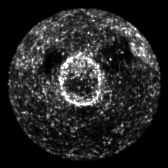

[Series 6: pet wb (ac) · axial · 5.0mm · 3.13mm/px · z∈[-206,+662]mm · 4 of 290 slices shown]
[im 1/290]
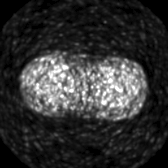
[im 97/290]
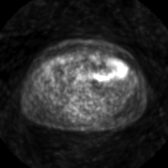
[im 193/290]
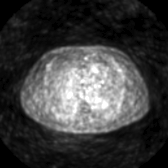
[im 290/290]
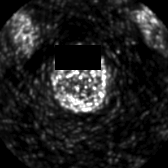

[Series 603: fused axial · 4 of 288 slices shown]
[im 1/288]
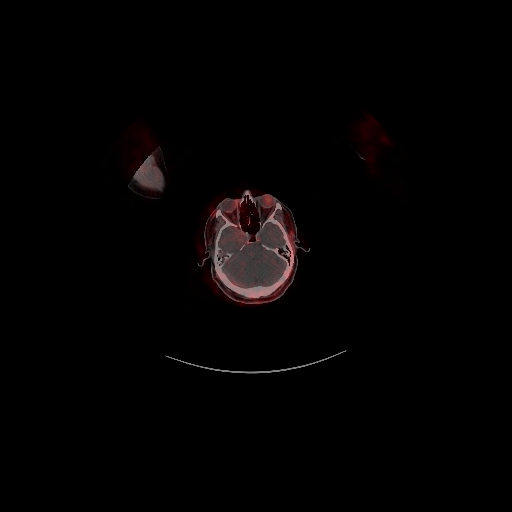
[im 96/288]
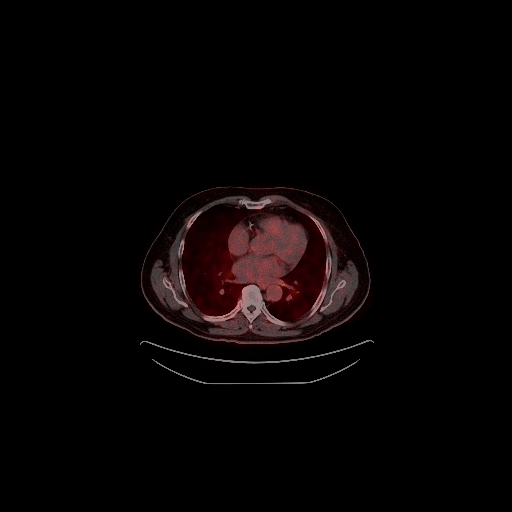
[im 192/288]
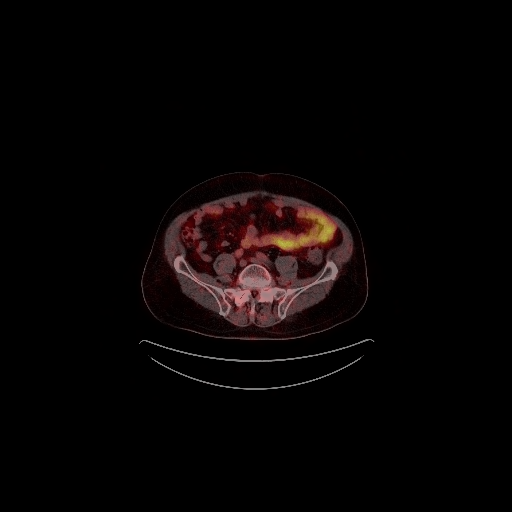
[im 288/288]
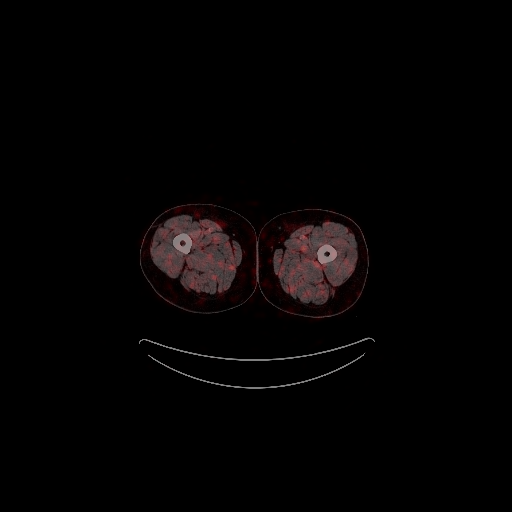

[Series 604: fused coronal · 1 of 114 slices shown]
[im 1/114]
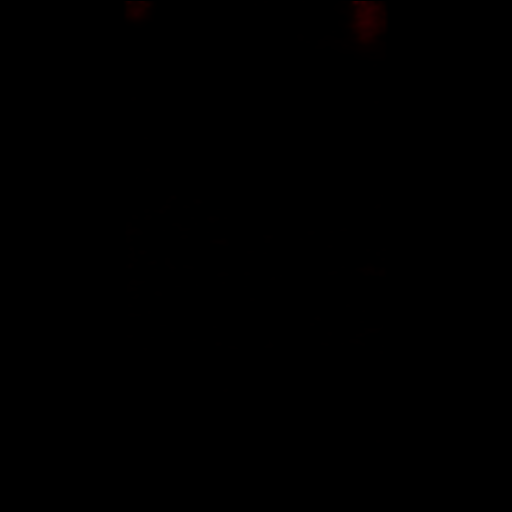

[Series 605: fused sagittal · 2 of 143 slices shown]
[im 1/143]
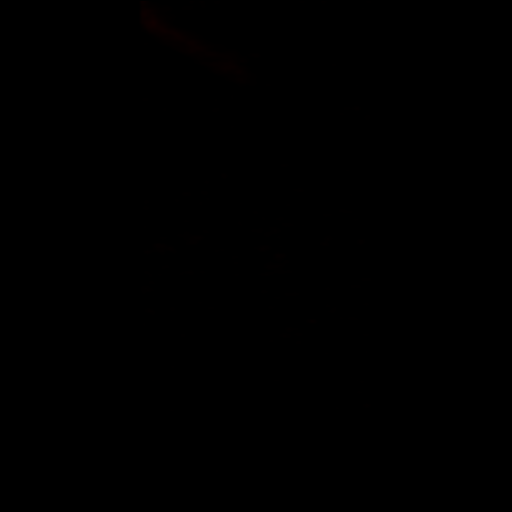
[im 143/143]
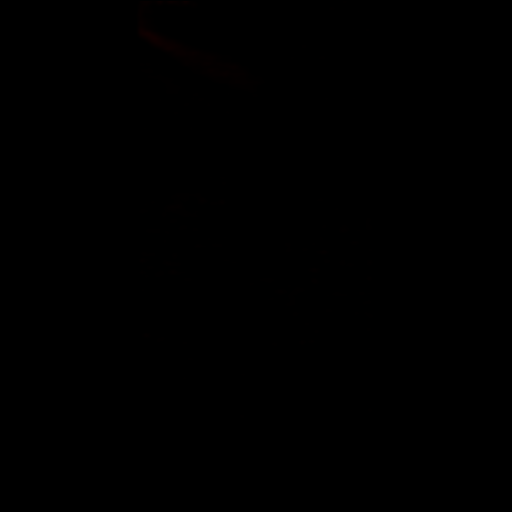

[Series 606: pet axial · 4 of 289 slices shown]
[im 1/289]
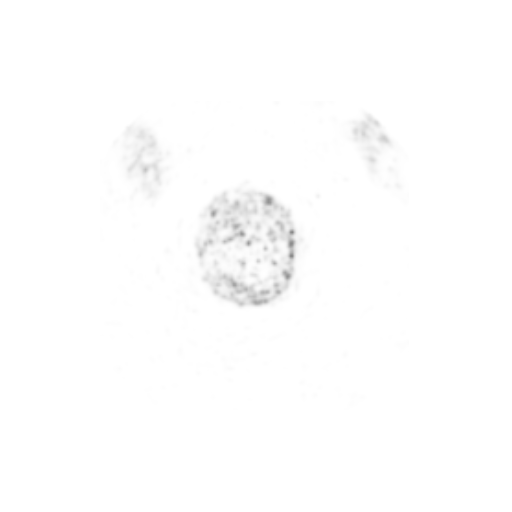
[im 97/289]
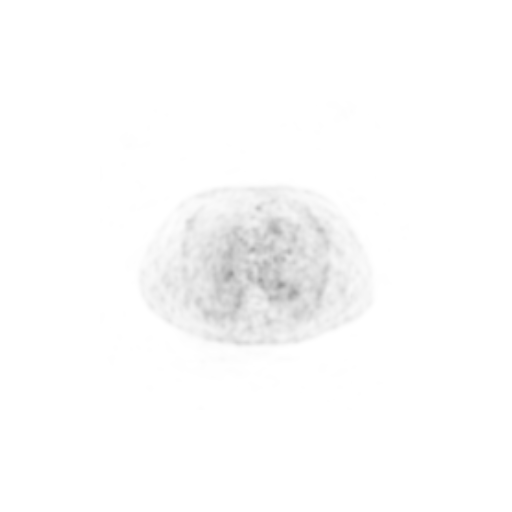
[im 193/289]
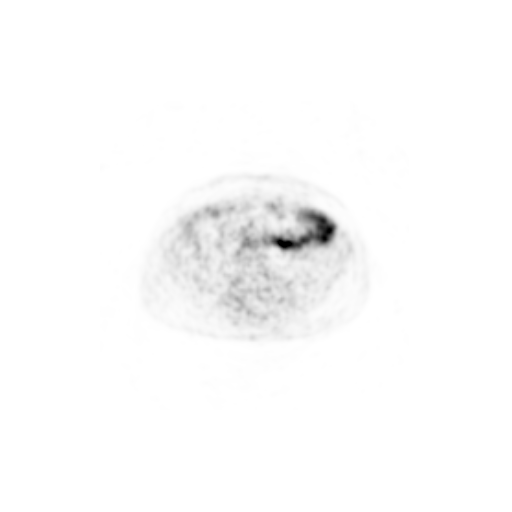
[im 289/289]
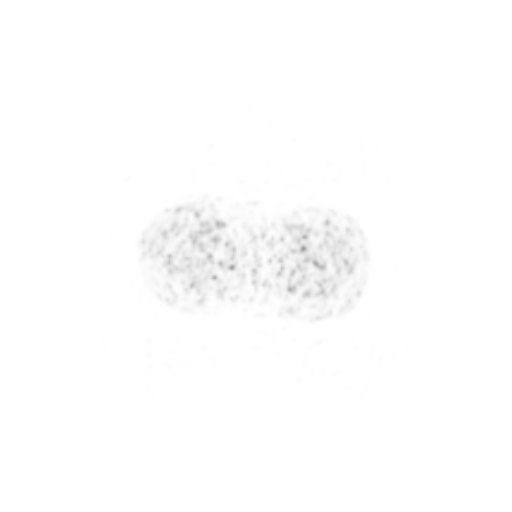

[Series 607: pet coronal · 2 of 136 slices shown]
[im 1/136]
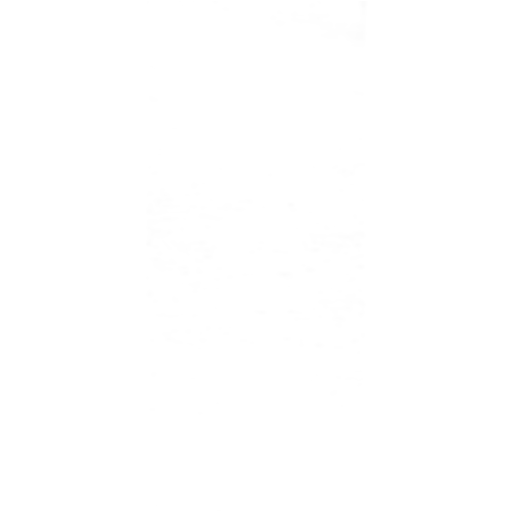
[im 136/136]
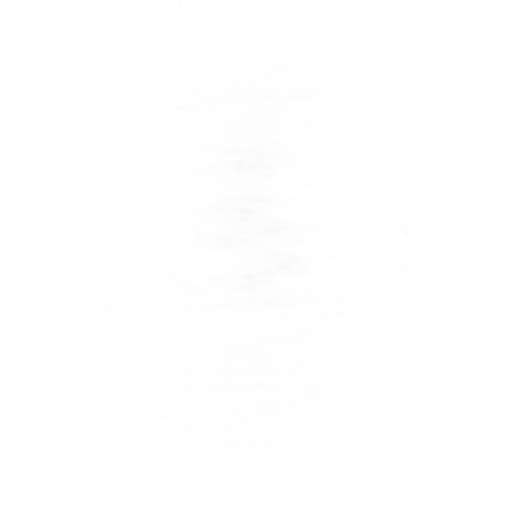

[Series 608: pet sagittal · 2 of 170 slices shown]
[im 1/170]
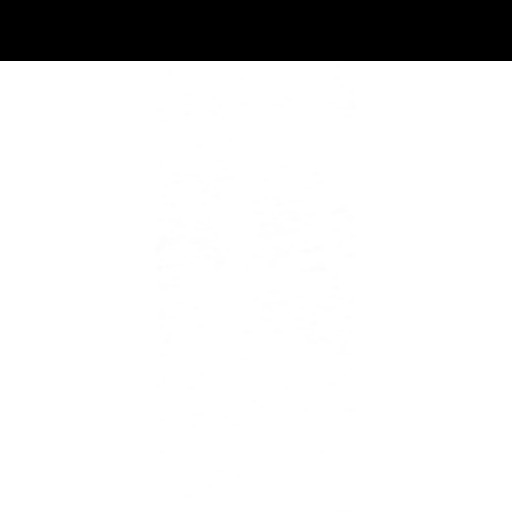
[im 170/170]
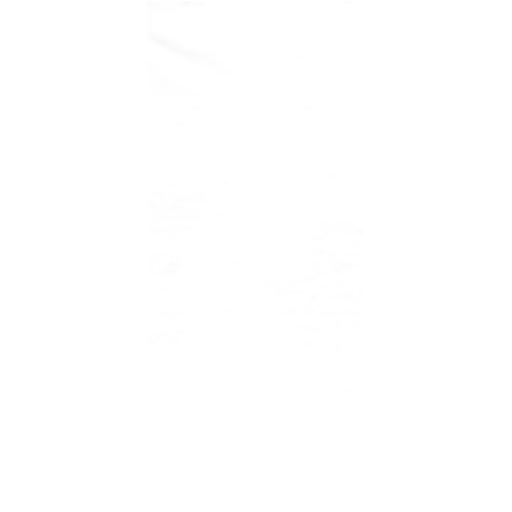

[25 of 25 positions shown; findings below may reference images not displayed]

FINDINGS: NECK

No radiotracer activity in neck lymph nodes.

Incidental CT finding: None

CHEST

No radiotracer accumulation within mediastinal or hilar lymph nodes.
No suspicious pulmonary nodules on the CT scan.

Incidental CT finding: None

ABDOMEN/PELVIS

Prostate: Intense radiotracer activity in the midline base of the
prostate gland with SUV max equal 19.3.

Lymph nodes: No abnormal radiotracer accumulation within pelvic or
abdominal nodes.

Liver: No evidence of liver metastasis

Incidental CT finding: Simple cyst of the LEFT kidney

SKELETON

No focal  activity to suggest skeletal metastasis.
IMPRESSION: 1. Intense radiotracer activity in the midline base of the prostate
gland suggestive primary prostate carcinoma.
2. No evidence of metastatic adenopathy within the pelvis or
periaortic retroperitoneum.
3. No evidence of visceral metastasis or skeletal metastasis.

## 2022-06-28 ENCOUNTER — Other Ambulatory Visit: Payer: Self-pay | Admitting: Radiation Oncology

## 2022-07-01 DIAGNOSIS — E113291 Type 2 diabetes mellitus with mild nonproliferative diabetic retinopathy without macular edema, right eye: Secondary | ICD-10-CM | POA: Diagnosis not present

## 2022-07-01 DIAGNOSIS — H35372 Puckering of macula, left eye: Secondary | ICD-10-CM | POA: Diagnosis not present

## 2022-07-01 DIAGNOSIS — E113212 Type 2 diabetes mellitus with mild nonproliferative diabetic retinopathy with macular edema, left eye: Secondary | ICD-10-CM | POA: Diagnosis not present

## 2022-07-01 DIAGNOSIS — H348322 Tributary (branch) retinal vein occlusion, left eye, stable: Secondary | ICD-10-CM | POA: Diagnosis not present

## 2022-07-23 ENCOUNTER — Other Ambulatory Visit: Payer: Self-pay | Admitting: Family Medicine

## 2022-07-23 DIAGNOSIS — E7800 Pure hypercholesterolemia, unspecified: Secondary | ICD-10-CM

## 2022-07-23 NOTE — Telephone Encounter (Signed)
Requested Prescriptions  Pending Prescriptions Disp Refills   lovastatin (MEVACOR) 40 MG tablet [Pharmacy Med Name: LOVASTATIN TABS '40MG'$ ] 90 tablet 2    Sig: TAKE 1 TABLET DAILY     Cardiovascular:  Antilipid - Statins 2 Failed - 07/23/2022  2:26 AM      Failed - Lipid Panel in normal range within the last 12 months    Cholesterol, Total  Date Value Ref Range Status  02/13/2022 160 100 - 199 mg/dL Final   Cholesterol Piccolo, Waived  Date Value Ref Range Status  06/09/2019 172 <200 mg/dL Final    Comment:                            Desirable                <200                         Borderline High      200- 239                         High                     >239    LDL Chol Calc (NIH)  Date Value Ref Range Status  02/13/2022 69 0 - 99 mg/dL Final   HDL  Date Value Ref Range Status  02/13/2022 61 >39 mg/dL Final   Triglycerides  Date Value Ref Range Status  02/13/2022 184 (H) 0 - 149 mg/dL Final   Triglycerides Piccolo,Waived  Date Value Ref Range Status  06/09/2019 342 (H) <150 mg/dL Final    Comment:                            Normal                   <150                         Borderline High     150 - 199                         High                200 - 499                         Very High                >499          Passed - Cr in normal range and within 360 days    Creatinine, Ser  Date Value Ref Range Status  02/13/2022 0.95 0.76 - 1.27 mg/dL Final         Passed - Patient is not pregnant      Passed - Valid encounter within last 12 months    Recent Outpatient Visits           2 months ago Controlled type 2 diabetes mellitus with complication, without long-term current use of insulin ([place])   [place], [name] P, DO   5 months ago Primary hypertension   [facility] [place], [name] P, DO   7 months ago Upper respiratory tract infection, unspecified  type   [facility] [place], Avanti, MD   8 months  ago Controlled type 2 diabetes mellitus with complication, without long-term current use of insulin ([place])   [place], [name] P, DO   10 months ago Upper respiratory tract infection, unspecified type   [place], Scheryl Darter, NP       Future Appointments             In 1 month Johnson, Barb Merino, DO [place], [place]   In 1 month Ralene Bathe, MD [place]   In 2 months Hollice Espy, MD [place]

## 2022-07-24 ENCOUNTER — Other Ambulatory Visit: Payer: Self-pay | Admitting: Family Medicine

## 2022-07-24 ENCOUNTER — Ambulatory Visit (INDEPENDENT_AMBULATORY_CARE_PROVIDER_SITE_OTHER): Payer: Medicare Other | Admitting: Family Medicine

## 2022-07-24 ENCOUNTER — Ambulatory Visit
Admission: RE | Admit: 2022-07-24 | Discharge: 2022-07-24 | Disposition: A | Discharge status: Home / Self Care | Payer: Medicare Other | Source: Ambulatory Visit | Attending: Family Medicine | Admitting: Family Medicine

## 2022-07-24 ENCOUNTER — Encounter: Payer: Self-pay | Admitting: Family Medicine

## 2022-07-24 VITALS — BP 95/55 | HR 64 | Temp 97.5°F | Wt 199.9 lb

## 2022-07-24 DIAGNOSIS — M7989 Other specified soft tissue disorders: Secondary | ICD-10-CM

## 2022-07-24 DIAGNOSIS — M79605 Pain in left leg: Secondary | ICD-10-CM | POA: Diagnosis not present

## 2022-07-24 MED ORDER — NAPROXEN 500 MG PO TABS: 500.0000 mg | ORAL_TABLET | Freq: Two times a day (BID) | ORAL | 1 refills | Status: DC

## 2022-07-24 MED ORDER — BACLOFEN 10 MG PO TABS: 5.0000 mg | ORAL_TABLET | Freq: Every evening | ORAL | 0 refills | Status: DC | PRN

## 2022-07-24 NOTE — Progress Notes (Signed)
Patient notified of results  by phone, no DVT. Will treat for muscle spasm with naproxen and baclofen. Follow up as scheduled in December.

## 2022-07-24 NOTE — Progress Notes (Signed)
BP (!) 95/55   Pulse 64   Temp (!) 97.5 F (36.4 C)   Wt 199 lb 14.4 oz (90.7 kg)   SpO2 96%   BMI 28.68 kg/m    Subjective:    Patient ID: Troy Christensen, male    DOB: [birth year], 71 y.o.   MRN: [id number]  HPI: Troy Christensen is a 71 y.o. male  Chief Complaint  Patient presents with   Leg Pain    Patient states his left lower leg is painful for about a month, pain is constant. Has tried voltaren gel but did not help much.    L leg has been swelling and painful in the calf for about a month. No redness. He notes that he tried voltaren gel, but it has not helped. He was sick with COVID about a month ago. No other concerns or complaints at this time.   Relevant past medical, surgical, family and social history reviewed and updated as indicated. Interim medical history since our last visit reviewed. Allergies and medications reviewed and updated.  Review of Systems  Constitutional: Negative.   Respiratory: Negative.    Cardiovascular: Negative.   Musculoskeletal:  Positive for myalgias. Negative for arthralgias, back pain, gait problem, joint swelling, neck pain and neck stiffness.  Neurological: Negative.   Psychiatric/Behavioral: Negative.      Per HPI unless specifically indicated above     Objective:    BP (!) 95/55   Pulse 64   Temp (!) 97.5 F (36.4 C)   Wt 199 lb 14.4 oz (90.7 kg)   SpO2 96%   BMI 28.68 kg/m   Wt Readings from Last 3 Encounters:  07/24/22 199 lb 14.4 oz (90.7 kg)  05/22/22 199 lb 14.4 oz (90.7 kg)  04/21/22 203 lb (92.1 kg)    Physical Exam Vitals and nursing note reviewed.  Constitutional:      General: He is not in acute distress.    Appearance: Normal appearance. He is not ill-appearing, toxic-appearing or diaphoretic.  HENT:     Head: Normocephalic and atraumatic.     Right Ear: External ear normal.     Left Ear: External ear normal.     Nose: Nose normal.     Mouth/Throat:     Mouth: Mucous membranes are moist.      Pharynx: Oropharynx is clear.  Eyes:     General: No scleral icterus.       Right eye: No discharge.        Left eye: No discharge.     Extraocular Movements: Extraocular movements intact.     Conjunctiva/sclera: Conjunctivae normal.     Pupils: Pupils are equal, round, and reactive to light.  Cardiovascular:     Rate and Rhythm: Normal rate and regular rhythm.     Pulses: Normal pulses.     Heart sounds: Normal heart sounds. No murmur heard.    No friction rub. No gallop.  Pulmonary:     Effort: Pulmonary effort is normal. No respiratory distress.     Breath sounds: Normal breath sounds. No stridor. No wheezing, rhonchi or rales.  Chest:     Chest wall: No tenderness.  Musculoskeletal:        General: Swelling and tenderness present. No deformity or signs of injury. Normal range of motion.     Cervical back: Normal range of motion and neck supple.     Right lower leg: No edema.     Left lower leg: No edema.  Comments: + [place] and squeeze, tense edema on L calf  Skin:    General: Skin is warm and dry.     Capillary Refill: Capillary refill takes less than 2 seconds.     Coloration: Skin is not jaundiced or pale.     Findings: No bruising, erythema, lesion or rash.  Neurological:     General: No focal deficit present.     Mental Status: He is alert and oriented to person, place, and time. Mental status is at baseline.  Psychiatric:        Mood and Affect: Mood normal.        Behavior: Behavior normal.        Thought Content: Thought content normal.        Judgment: Judgment normal.     Results for orders placed or performed during the hospital encounter of 06/01/22  Resp Panel by RT-PCR (Flu A&B, Covid) Anterior Nasal Swab   Specimen: Anterior Nasal Swab  Result Value Ref Range   SARS Coronavirus 2 by RT PCR POSITIVE (A) NEGATIVE   Influenza A by PCR NEGATIVE NEGATIVE   Influenza B by PCR NEGATIVE NEGATIVE  Group A Strep by PCR   Specimen: Throat; Sterile Swab   Result Value Ref Range   Group A Strep by PCR NOT DETECTED NOT DETECTED      Assessment & Plan:   Problem List Items Addressed This Visit   None Visit Diagnoses     Left leg swelling    -  Primary   Concern for DVT. Will check [place]. Ordered today. Await results.   Relevant Orders   US Venous Img Lower Unilateral Left        Follow up plan: Return as scheduled.

## 2022-07-24 NOTE — Patient Instructions (Signed)
Shoulder exercises for Troy Christensen:

## 2022-07-25 ENCOUNTER — Telehealth: Payer: Self-pay | Admitting: Family Medicine

## 2022-07-25 ENCOUNTER — Other Ambulatory Visit: Payer: Self-pay | Admitting: Family Medicine

## 2022-07-25 MED ORDER — MELOXICAM 15 MG PO TABS: 15.0000 mg | ORAL_TABLET | Freq: Every day | ORAL | 0 refills | Status: DC

## 2022-07-25 NOTE — Telephone Encounter (Signed)
Pt states that he is allergic to Naproxen and that he would prefer an alternative option

## 2022-07-25 NOTE — Telephone Encounter (Signed)
I will mark it as an allergy. Please find out what his reaction was. Alternative medication sent to his pharmacy

## 2022-07-25 NOTE — Telephone Encounter (Signed)
Patient is aware of rx at pharmacy, states naproxen slows him down. Noted allergy in chart.

## 2022-07-25 NOTE — Telephone Encounter (Signed)
Pharmacy called to inform PCP patient stated he can not take naproxen (NAPROSYN) 500 MG tablet. Caller stated patient did not give any reason to why he can not take medication.

## 2022-07-25 NOTE — Telephone Encounter (Signed)
Please call patient and find out what his concern is- I only want him to take it for up to 2 weeks, not long term so if he's concerned about his kidneys, its OK for a very short period of time.

## 2022-07-28 ENCOUNTER — Inpatient Hospital Stay: Payer: Medicare Other | Attending: Radiation Oncology

## 2022-07-28 DIAGNOSIS — C61 Malignant neoplasm of prostate: Secondary | ICD-10-CM | POA: Diagnosis not present

## 2022-07-28 LAB — PSA: Prostatic Specific Antigen: 0.04 ng/mL (ref 0.00–4.00)

## 2022-08-05 ENCOUNTER — Ambulatory Visit (INDEPENDENT_AMBULATORY_CARE_PROVIDER_SITE_OTHER): Payer: Medicare Other | Admitting: Family Medicine

## 2022-08-05 ENCOUNTER — Ambulatory Visit
Admission: RE | Admit: 2022-08-05 | Discharge: 2022-08-05 | Disposition: A | Discharge status: Home / Self Care | Payer: Medicare Other | Attending: Family Medicine | Admitting: Family Medicine

## 2022-08-05 ENCOUNTER — Ambulatory Visit
Admission: RE | Admit: 2022-08-05 | Discharge: 2022-08-05 | Disposition: A | Discharge status: Home / Self Care | Payer: Medicare Other | Source: Ambulatory Visit | Attending: Family Medicine | Admitting: Family Medicine

## 2022-08-05 ENCOUNTER — Encounter: Payer: Self-pay | Admitting: Family Medicine

## 2022-08-05 VITALS — BP 132/62 | HR 64 | Temp 97.5°F | Wt 201.6 lb

## 2022-08-05 DIAGNOSIS — M79605 Pain in left leg: Secondary | ICD-10-CM | POA: Diagnosis not present

## 2022-08-05 DIAGNOSIS — M7989 Other specified soft tissue disorders: Secondary | ICD-10-CM | POA: Diagnosis not present

## 2022-08-05 DIAGNOSIS — M545 Low back pain, unspecified: Secondary | ICD-10-CM | POA: Diagnosis not present

## 2022-08-05 MED ORDER — GABAPENTIN 100 MG PO CAPS: 100.0000 mg | ORAL_CAPSULE | Freq: Every day | ORAL | 3 refills | Status: DC

## 2022-08-05 NOTE — Progress Notes (Signed)
BP 132/62   Pulse 64   Temp (!) 97.5 F (36.4 C) (Oral)   Wt 201 lb 9.6 oz (91.4 kg)   SpO2 98%   BMI 28.93 kg/m    Subjective:    Patient ID: Troy Christensen, male    DOB: 11/26/1950, 71 y.o.   MRN: [id number]  HPI: [name] is a 71 y.o. male  Chief Complaint  Patient presents with   Leg Swelling    Pt states he is still having leg pain and swelling, states the meloxicam is not helping    Aching constant pain in his L calf. Notes that the meloxicam didn't help. He is feeling worse. He denies any numbness or tingling but notes that it is shooting and severe. Nothing makes it better and worse at the end of the day. He does note some swelling. No other concerns or complaints at this time.   Relevant past medical, surgical, family and social history reviewed and updated as indicated. Interim medical history since our last visit reviewed. Allergies and medications reviewed and updated.  Review of Systems  Constitutional: Negative.   Respiratory: Negative.    Cardiovascular: Negative.   Gastrointestinal: Negative.   Musculoskeletal:  Positive for myalgias. Negative for arthralgias, back pain, gait problem, joint swelling, neck pain and neck stiffness.  Neurological: Negative.   Psychiatric/Behavioral: Negative.      Per HPI unless specifically indicated above     Objective:    BP 132/62   Pulse 64   Temp (!) 97.5 F (36.4 C) (Oral)   Wt 201 lb 9.6 oz (91.4 kg)   SpO2 98%   BMI 28.93 kg/m   Wt Readings from Last 3 Encounters:  08/05/22 201 lb 9.6 oz (91.4 kg)  07/24/22 199 lb 14.4 oz (90.7 kg)  05/22/22 199 lb 14.4 oz (90.7 kg)    Physical Exam Vitals and nursing note reviewed.  Constitutional:      General: He is not in acute distress.    Appearance: Normal appearance. He is well-developed.  HENT:     Head: Normocephalic and atraumatic.     Right Ear: Hearing and external ear normal.     Left Ear: Hearing and external ear normal.     Nose: Nose  normal.     Mouth/Throat:     Mouth: Mucous membranes are moist.     Pharynx: Oropharynx is clear.  Eyes:     General: Lids are normal. No scleral icterus.       Right eye: No discharge.        Left eye: No discharge.     Conjunctiva/sclera: Conjunctivae normal.  Pulmonary:     Effort: Pulmonary effort is normal. No respiratory distress.  Musculoskeletal:        General: Normal range of motion.  Skin:    Coloration: Skin is not jaundiced or pale.     Findings: No bruising, erythema, lesion or rash.  Neurological:     Mental Status: He is alert. Mental status is at baseline. He is disoriented.  Psychiatric:        Mood and Affect: Mood normal.        Speech: Speech normal.        Behavior: Behavior normal.        Thought Content: Thought content normal.        Judgment: Judgment normal.     Results for orders placed or performed in visit on 07/28/22  PSA  Result Value Ref  Range   Prostatic Specific Antigen 0.04 0.00 - 4.00 ng/mL      Assessment & Plan:   Problem List Items Addressed This Visit   None Visit Diagnoses     Left leg pain    -  Primary   No better with meloxicam. Concern for radiculopathy. Will check x-rays and start gabapentin. Follow up 2 weeks. Call with any concerns.   Relevant Orders   DG Lumbar Spine Complete   DG Tibia/Fibula Left        Follow up plan: Return in about 2 weeks (around 08/19/2022).

## 2022-08-06 ENCOUNTER — Ambulatory Visit
Admission: RE | Admit: 2022-08-06 | Discharge: 2022-08-06 | Disposition: A | Discharge status: Home / Self Care | Payer: Medicare Other | Source: Ambulatory Visit | Attending: Radiation Oncology | Admitting: Radiation Oncology

## 2022-08-06 ENCOUNTER — Other Ambulatory Visit: Payer: Self-pay | Admitting: *Deleted

## 2022-08-06 ENCOUNTER — Encounter: Payer: Self-pay | Admitting: Radiation Oncology

## 2022-08-06 VITALS — BP 144/77 | HR 60 | Temp 98.3°F | Resp 16 | Ht 70.0 in | Wt 202.0 lb

## 2022-08-06 DIAGNOSIS — C61 Malignant neoplasm of prostate: Secondary | ICD-10-CM | POA: Diagnosis not present

## 2022-08-06 DIAGNOSIS — Z923 Personal history of irradiation: Secondary | ICD-10-CM | POA: Diagnosis not present

## 2022-08-06 DIAGNOSIS — Z191 Hormone sensitive malignancy status: Secondary | ICD-10-CM | POA: Diagnosis not present

## 2022-08-06 NOTE — Progress Notes (Signed)
Radiation Oncology Follow up Note  Name: Troy Christensen   Date:   08/06/2022 MRN:  [id number] DOB: 30-Oct-1950    This 71 y.o. male presents to the clinic today for 58-monthfollow-up status post I-125 interstitial implant for salvage and patient treated 6 years prior with IMRT radiation therapy for stage IIb Gleason 8 (4+4) adenocarcinoma presenting originally with a PSA of 6.3.  REFERRING PROVIDER: JValerie Roys DO  HPI: Patient is a 71year old male now out 16 months having completed salvage radiation therapy with an I-125 interstitial implant and patient treated 6 years prior with IMRT radiation therapy to his prostate and pelvic nodes for stage IIb Gleason 8 adenocarcinoma seen today in routine follow-up he is doing well.  He specifically denies any increased lower urinary tract symptoms diarrhea.  Does have some pain in his lower extremity which is being addressed by other physician.  His most recent PSA is 0.04.  Was 0.07 a year ago and 0.036 months prior  COMPLICATIONS OF TREATMENT: none  FOLLOW UP COMPLIANCE: keeps appointments   PHYSICAL EXAM:  BP (!) 144/77 (BP Location: Right Arm, Patient Position: Sitting, Cuff Size: Normal)   Pulse 60   Temp 98.3 F (36.8 C) (Tympanic)   Resp 16   Ht '5\' 10"'$  (1.778 m)   Wt 202 lb (91.6 kg)   BMI 28.98 kg/m  Well-developed well-nourished patient in NAD. HEENT reveals PERLA, EOMI, discs not visualized.  Oral cavity is clear. No oral mucosal lesions are identified. Neck is clear without evidence of cervical or supraclavicular adenopathy. Lungs are clear to A&P. Cardiac examination is essentially unremarkable with regular rate and rhythm without murmur rub or thrill. Abdomen is benign with no organomegaly or masses noted. Motor sensory and DTR levels are equal and symmetric in the upper and lower extremities. Cranial nerves II through XII are grossly intact. Proprioception is intact. No peripheral adenopathy or edema is identified. No motor  or sensory levels are noted. Crude visual fields are within normal range.  RADIOLOGY RESULTS: No current films for review  PLAN: Present time patient is doing well there are excellent biochemical control of his prostate cancer.  I am asked to see him back in 1 year for follow-up.  He sees Dr. BErlene Quanin January.  Patient is to call with any concerns.  I would like to take this opportunity to thank you for allowing me to participate in the care of your patient..Noreene Filbert MD

## 2022-08-11 ENCOUNTER — Telehealth: Payer: Self-pay | Admitting: Family Medicine

## 2022-08-11 NOTE — Telephone Encounter (Signed)
Pt. Spouse came into the office stated that pt can not take Gabapentin 100 mg because he is not aware that he has to urinate and voided on himself several times  he needs a different medication and complains that the he is  left leg is in a lot of pain. Please advise

## 2022-08-11 NOTE — Telephone Encounter (Signed)
Appt scheduled

## 2022-08-11 NOTE — Telephone Encounter (Signed)
Patient  would like to be seen before next available. Due to leg pain and not able to take medication.Please advise

## 2022-08-14 NOTE — Telephone Encounter (Signed)
I cannot see him before next available. He can see someone else in the office if they have availability.

## 2022-08-15 NOTE — Telephone Encounter (Signed)
Called patient to see if he would like to schedule with another provider.  Stated he has appointment with provider on Monday and that he was fine seeing provider then.

## 2022-08-18 ENCOUNTER — Encounter: Payer: Self-pay | Admitting: Internal Medicine

## 2022-08-18 ENCOUNTER — Ambulatory Visit (INDEPENDENT_AMBULATORY_CARE_PROVIDER_SITE_OTHER): Payer: Medicare Other | Admitting: Family Medicine

## 2022-08-18 ENCOUNTER — Encounter: Payer: Self-pay | Admitting: Family Medicine

## 2022-08-18 VITALS — BP 113/57 | HR 65 | Temp 97.4°F | Ht 70.0 in | Wt 206.2 lb

## 2022-08-18 DIAGNOSIS — M5416 Radiculopathy, lumbar region: Secondary | ICD-10-CM | POA: Diagnosis not present

## 2022-08-18 MED ORDER — PREGABALIN 25 MG PO CAPS: ORAL_CAPSULE | ORAL | 2 refills | Status: DC

## 2022-08-18 NOTE — Progress Notes (Signed)
BP (!) 113/57   Pulse 65   Temp (!) 97.4 F (36.3 C) (Oral)   Ht '5\' 10"'$  (1.778 m)   Wt 206 lb 3.2 oz (93.5 kg)   SpO2 97%   BMI 29.59 kg/m    Subjective:    Patient ID: Troy Christensen, male    DOB: [birth year], 71 y.o.   MRN: [id number]  HPI: Troy Christensen is a 71 y.o. male  Chief Complaint  Patient presents with   Leg Pain    Patient says he is not any better as he has not able to take Gabapentin.    BACK PAIN- had urinary incontinence on the gabapentin 1x- has entirely resolved since   Duration: weeks Mechanism of injury: unknown Location: Left and low back Onset: sudden Severity: moderate Quality: shooting and numb Frequency: constant Radiation: L leg below the knee Aggravating factors: moving, standing Alleviating factors: nothing Status: worse Treatments attempted: gabapentin, meloxicam  Relief with NSAIDs?: no Nighttime pain:  yes Paresthesias / decreased sensation:  yes Bowel / bladder incontinence:  no Fevers:  no Dysuria / urinary frequency:  no  Relevant past medical, surgical, family and social history reviewed and updated as indicated. Interim medical history since our last visit reviewed. Allergies and medications reviewed and updated.  Review of Systems  Constitutional: Negative.   Respiratory: Negative.    Cardiovascular: Negative.   Gastrointestinal: Negative.   Musculoskeletal:  Positive for back pain and myalgias. Negative for arthralgias, gait problem, joint swelling, neck pain and neck stiffness.  Skin: Negative.   Neurological: Negative.   Psychiatric/Behavioral: Negative.      Per HPI unless specifically indicated above     Objective:    BP (!) 113/57   Pulse 65   Temp (!) 97.4 F (36.3 C) (Oral)   Ht '5\' 10"'$  (1.778 m)   Wt 206 lb 3.2 oz (93.5 kg)   SpO2 97%   BMI 29.59 kg/m   Wt Readings from Last 3 Encounters:  08/18/22 206 lb 3.2 oz (93.5 kg)  08/06/22 202 lb (91.6 kg)  08/05/22 201 lb 9.6 oz (91.4 kg)     Physical Exam Vitals and nursing note reviewed.  Constitutional:      General: He is not in acute distress.    Appearance: Normal appearance. He is normal weight. He is not ill-appearing, toxic-appearing or diaphoretic.  HENT:     Head: Normocephalic and atraumatic.     Right Ear: External ear normal.     Left Ear: External ear normal.     Nose: Nose normal.     Mouth/Throat:     Mouth: Mucous membranes are moist.     Pharynx: Oropharynx is clear.  Eyes:     General: No scleral icterus.       Right eye: No discharge.        Left eye: No discharge.     Extraocular Movements: Extraocular movements intact.     Conjunctiva/sclera: Conjunctivae normal.     Pupils: Pupils are equal, round, and reactive to light.  Cardiovascular:     Rate and Rhythm: Normal rate and regular rhythm.     Pulses: Normal pulses.     Heart sounds: Normal heart sounds. No murmur heard.    No friction rub. No gallop.  Pulmonary:     Effort: Pulmonary effort is normal. No respiratory distress.     Breath sounds: Normal breath sounds. No stridor. No wheezing, rhonchi or rales.  Chest:  Chest wall: No tenderness.  Musculoskeletal:        General: Normal range of motion.     Cervical back: Normal range of motion and neck supple.  Skin:    General: Skin is warm and dry.     Capillary Refill: Capillary refill takes less than 2 seconds.     Coloration: Skin is not jaundiced or pale.     Findings: No bruising, erythema, lesion or rash.  Neurological:     General: No focal deficit present.     Mental Status: He is alert and oriented to person, place, and time. Mental status is at baseline.  Psychiatric:        Mood and Affect: Mood normal.        Behavior: Behavior normal.        Thought Content: Thought content normal.        Judgment: Judgment normal.     Results for orders placed or performed in visit on 07/28/22  PSA  Result Value Ref Range   Prostatic Specific Antigen 0.04 0.00 - 4.00 ng/mL       Assessment & Plan:   Problem List Items Addressed This Visit   None Visit Diagnoses     Lumbar radiculopathy    -  Primary   Unable to tolerate gabapentin. Will start lyrica and get PT going. Recheck 2 weeks. Call with any concerns.   Relevant Medications   pregabalin (LYRICA) 25 MG capsule   Other Relevant Orders   Ambulatory referral to Physical Therapy        Follow up plan: Return in about 2 weeks (around 09/01/2022).

## 2022-08-26 DIAGNOSIS — E113213 Type 2 diabetes mellitus with mild nonproliferative diabetic retinopathy with macular edema, bilateral: Secondary | ICD-10-CM | POA: Diagnosis not present

## 2022-08-26 DIAGNOSIS — H34832 Tributary (branch) retinal vein occlusion, left eye, with macular edema: Secondary | ICD-10-CM | POA: Diagnosis not present

## 2022-08-26 DIAGNOSIS — H43813 Vitreous degeneration, bilateral: Secondary | ICD-10-CM | POA: Diagnosis not present

## 2022-08-26 DIAGNOSIS — H35033 Hypertensive retinopathy, bilateral: Secondary | ICD-10-CM | POA: Diagnosis not present

## 2022-08-27 ENCOUNTER — Ambulatory Visit: Payer: Medicare Other | Admitting: Family Medicine

## 2022-08-28 DIAGNOSIS — M5416 Radiculopathy, lumbar region: Secondary | ICD-10-CM | POA: Diagnosis not present

## 2022-09-03 ENCOUNTER — Ambulatory Visit: Payer: Medicare Other | Admitting: Dermatology

## 2022-09-03 DIAGNOSIS — M5416 Radiculopathy, lumbar region: Secondary | ICD-10-CM | POA: Diagnosis not present

## 2022-09-04 ENCOUNTER — Ambulatory Visit (INDEPENDENT_AMBULATORY_CARE_PROVIDER_SITE_OTHER): Payer: Medicare Other | Admitting: Family Medicine

## 2022-09-04 ENCOUNTER — Telehealth: Payer: Self-pay

## 2022-09-04 ENCOUNTER — Encounter: Payer: Self-pay | Admitting: Family Medicine

## 2022-09-04 VITALS — BP 138/66 | HR 56 | Temp 97.6°F | Ht 70.0 in | Wt 207.6 lb

## 2022-09-04 DIAGNOSIS — C61 Malignant neoplasm of prostate: Secondary | ICD-10-CM

## 2022-09-04 DIAGNOSIS — I700 Atherosclerosis of aorta: Secondary | ICD-10-CM

## 2022-09-04 DIAGNOSIS — I10 Essential (primary) hypertension: Secondary | ICD-10-CM

## 2022-09-04 DIAGNOSIS — E118 Type 2 diabetes mellitus with unspecified complications: Secondary | ICD-10-CM

## 2022-09-04 DIAGNOSIS — T560X1D Toxic effect of lead and its compounds, accidental (unintentional), subsequent encounter: Secondary | ICD-10-CM | POA: Diagnosis not present

## 2022-09-04 DIAGNOSIS — M1A1790 Lead-induced chronic gout, unspecified ankle and foot, without tophus (tophi): Secondary | ICD-10-CM

## 2022-09-04 DIAGNOSIS — M5416 Radiculopathy, lumbar region: Secondary | ICD-10-CM | POA: Diagnosis not present

## 2022-09-04 DIAGNOSIS — E785 Hyperlipidemia, unspecified: Secondary | ICD-10-CM | POA: Diagnosis not present

## 2022-09-04 LAB — URINALYSIS, ROUTINE W REFLEX MICROSCOPIC
Bilirubin, UA: NEGATIVE
Ketones, UA: NEGATIVE
Leukocytes,UA: NEGATIVE
Nitrite, UA: NEGATIVE
Specific Gravity, UA: 1.02 (ref 1.005–1.030)
Urobilinogen, Ur: 0.2 mg/dL (ref 0.2–1.0)
pH, UA: 5.5 (ref 5.0–7.5)

## 2022-09-04 LAB — BAYER DCA HB A1C WAIVED: HB A1C (BAYER DCA - WAIVED): 7.8 % — ABNORMAL HIGH (ref 4.8–5.6)

## 2022-09-04 LAB — MICROALBUMIN, URINE WAIVED
Creatinine, Urine Waived: 50 mg/dL (ref 10–300)
Microalb, Ur Waived: 80 mg/L — ABNORMAL HIGH (ref 0–19)

## 2022-09-04 LAB — MICROSCOPIC EXAMINATION
Bacteria, UA: NONE SEEN
Epithelial Cells (non renal): NONE SEEN /hpf (ref 0–10)

## 2022-09-04 MED ORDER — PREGABALIN 25 MG PO CAPS: ORAL_CAPSULE | ORAL | 2 refills | Status: DC

## 2022-09-04 MED ORDER — AMLODIPINE BESY-BENAZEPRIL HCL 5-20 MG PO CAPS: ORAL_CAPSULE | ORAL | 1 refills | Status: DC

## 2022-09-04 MED ORDER — PIOGLITAZONE HCL 45 MG PO TABS: 45.0000 mg | ORAL_TABLET | Freq: Every day | ORAL | 1 refills | Status: DC

## 2022-09-04 MED ORDER — ALLOPURINOL 100 MG PO TABS: 100.0000 mg | ORAL_TABLET | Freq: Every day | ORAL | 1 refills | Status: DC

## 2022-09-04 MED ORDER — METFORMIN HCL ER 500 MG PO TB24: 1000.0000 mg | ORAL_TABLET | Freq: Two times a day (BID) | ORAL | 1 refills | Status: DC

## 2022-09-04 MED ORDER — RYBELSUS 14 MG PO TABS: 14.0000 mg | ORAL_TABLET | Freq: Every day | ORAL | 1 refills | Status: DC

## 2022-09-04 NOTE — Telephone Encounter (Signed)
-----   Message from Valerie Roys, [place] sent at 09/04/2022  9:06 AM EST ----- [facility]- eye exam

## 2022-09-04 NOTE — Progress Notes (Signed)
BP 138/66   Pulse (!) 56   Temp 97.6 F (36.4 C) (Oral)   Ht '5\' 10"'$  (1.778 m)   Wt 207 lb 9.6 oz (94.2 kg)   SpO2 97%   BMI 29.79 kg/m    Subjective:    Patient ID: Troy Christensen, male    DOB: [birth year], 71 y.o.   MRN: [id number]  HPI: Troy Christensen is a 72 y.o. male  Chief Complaint  Patient presents with   Hypertension   Hyperlipidemia   Diabetes   Leg Pain   HYPERTENSION / HYPERLIPIDEMIA Satisfied with current treatment? yes Duration of hypertension: chronic BP monitoring frequency: not checking BP medication side effects: no Past BP meds: amlodipine, benazepril Duration of hyperlipidemia: chronic Cholesterol medication side effects: no Cholesterol supplements: fish oil Past cholesterol medications: lovastatin Medication compliance: excellent compliance Aspirin: yes Recent stressors: no Recurrent headaches: no Visual changes: no Palpitations: no Dyspnea: no Chest pain: no Lower extremity edema: no Dizzy/lightheaded: no  DIABETES Hypoglycemic episodes:no Polydipsia/polyuria: no Visual disturbance: no Chest pain: no Paresthesias: no Glucose Monitoring: no  Accucheck frequency: Not Checking  Fasting glucose: 126 this AM Taking Insulin?: no Blood Pressure Monitoring: not checking Retinal Examination: Up to Date Foot Exam: Up to Date Diabetic Education: Completed Pneumovax: Up to Date Influenza: Up to Date Aspirin: yes  No gout flares. Tolerating medicine well.   BACK PAIN- not feeling significantly better on the lyrica, feels like PT is helping. No side effects from lyrica Duration: about a month and a half Mechanism of injury: unknown Location: L low back and leg Onset: sudden Severity: moderate Quality: shooting and numb Frequency: constant Radiation: L leg below the knee Aggravating factors: moving, standing Alleviating factors: PT Status: stable Treatments attempted: lyrica, gabapentin, meloxicam, PT  Relief with NSAIDs?:  no Nighttime pain:  yes Paresthesias / decreased sensation:  yes Bowel / bladder incontinence:  no Fevers:  no Dysuria / urinary frequency:  no    Relevant past medical, surgical, family and social history reviewed and updated as indicated. Interim medical history since our last visit reviewed. Allergies and medications reviewed and updated.  Review of Systems  Constitutional: Negative.   Respiratory: Negative.    Cardiovascular: Negative.   Gastrointestinal: Negative.   Musculoskeletal:  Positive for back pain and myalgias. Negative for arthralgias, gait problem, joint swelling, neck pain and neck stiffness.  Skin: Negative.   Neurological:  Positive for numbness. Negative for dizziness, tremors, seizures, syncope, facial asymmetry, speech difficulty, weakness, light-headedness and headaches.  Psychiatric/Behavioral: Negative.      Per HPI unless specifically indicated above     Objective:    BP 138/66   Pulse (!) 56   Temp 97.6 F (36.4 C) (Oral)   Ht '5\' 10"'$  (1.778 m)   Wt 207 lb 9.6 oz (94.2 kg)   SpO2 97%   BMI 29.79 kg/m   Wt Readings from Last 3 Encounters:  09/04/22 207 lb 9.6 oz (94.2 kg)  08/18/22 206 lb 3.2 oz (93.5 kg)  08/06/22 202 lb (91.6 kg)    Physical Exam Vitals and nursing note reviewed.  Constitutional:      General: He is not in acute distress.    Appearance: Normal appearance. He is not ill-appearing, toxic-appearing or diaphoretic.  HENT:     Head: Normocephalic and atraumatic.     Right Ear: External ear normal.     Left Ear: External ear normal.     Nose: Nose normal.     Mouth/Throat:  Mouth: Mucous membranes are moist.     Pharynx: Oropharynx is clear.  Eyes:     General: No scleral icterus.       Right eye: No discharge.        Left eye: No discharge.     Extraocular Movements: Extraocular movements intact.     Conjunctiva/sclera: Conjunctivae normal.     Pupils: Pupils are equal, round, and reactive to light.   Cardiovascular:     Rate and Rhythm: Normal rate and regular rhythm.     Pulses: Normal pulses.     Heart sounds: Normal heart sounds. No murmur heard.    No friction rub. No gallop.  Pulmonary:     Effort: Pulmonary effort is normal. No respiratory distress.     Breath sounds: Normal breath sounds. No stridor. No wheezing, rhonchi or rales.  Chest:     Chest wall: No tenderness.  Musculoskeletal:        General: Normal range of motion.     Cervical back: Normal range of motion and neck supple.  Skin:    General: Skin is warm and dry.     Capillary Refill: Capillary refill takes less than 2 seconds.     Coloration: Skin is not jaundiced or pale.     Findings: No bruising, erythema, lesion or rash.  Neurological:     General: No focal deficit present.     Mental Status: He is alert and oriented to person, place, and time. Mental status is at baseline.  Psychiatric:        Mood and Affect: Mood normal.        Behavior: Behavior normal.        Thought Content: Thought content normal.        Judgment: Judgment normal.     Results for orders placed or performed in visit on 07/28/22  PSA  Result Value Ref Range   Prostatic Specific Antigen 0.04 0.00 - 4.00 ng/mL      Assessment & Plan:   Problem List Items Addressed This Visit       Cardiovascular and Mediastinum   Hypertension    Under good control on current regimen. Continue current regimen. Continue to monitor. Call with any concerns. Refills given. Labs drawn today.       Relevant Medications   amLODipine-benazepril (LOTREL) 5-20 MG capsule   Other Relevant Orders   Comprehensive metabolic panel   CBC with Differential/Platelet   TSH   Urinalysis, Routine w reflex microscopic   Aortic atherosclerosis (HCC)    Will keep BP and cholesterol under good control. Continue to monitor. Call with any concerns.       Relevant Medications   amLODipine-benazepril (LOTREL) 5-20 MG capsule   Other Relevant Orders    Comprehensive metabolic panel   CBC with Differential/Platelet     Endocrine   Controlled diabetes mellitus type 2 with complications ([place])    Not under great control with A1c of 7.8. Will really watch his diet. Continue current regimen. Continue to monitor. Call with any concerns. Refills given. Follow up sugars in 3 months.       Relevant Medications   amLODipine-benazepril (LOTREL) 5-20 MG capsule   metFORMIN (GLUCOPHAGE-XR) 500 MG 24 hr tablet   pioglitazone (ACTOS) 45 MG tablet   Semaglutide (RYBELSUS) 14 MG TABS   Other Relevant Orders   Comprehensive metabolic panel   CBC with Differential/Platelet   Microalbumin, Urine Waived   Bayer DCA Hb A1c Waived  Genitourinary   Prostate cancer [facility])    Continue to follow with oncology. Call with any concerns. Continue to monitor.       Relevant Medications   allopurinol (ZYLOPRIM) 100 MG tablet   Other Relevant Orders   Comprehensive metabolic panel   CBC with Differential/Platelet   PSA     Other   Hyperlipidemia    Under good control on current regimen. Continue current regimen. Continue to monitor. Call with any concerns. Refills given. Labs drawn today.      Relevant Medications   amLODipine-benazepril (LOTREL) 5-20 MG capsule   Other Relevant Orders   Comprehensive metabolic panel   CBC with Differential/Platelet   Lipid Panel w/o Chol/HDL Ratio   Gout    Under good control on current regimen. Continue current regimen. Continue to monitor. Call with any concerns. Refills given. Labs drawn today.      Relevant Medications   allopurinol (ZYLOPRIM) 100 MG tablet   Other Relevant Orders   Comprehensive metabolic panel   CBC with Differential/Platelet   Uric acid   Other Visit Diagnoses     Lumbar radiculopathy    -  Primary   Continue PT. Will increase his lyrica as he's tolerating it well. Follow up in 3 weeks.   Relevant Medications   pregabalin (LYRICA) 25 MG capsule   Essential hypertension        Relevant Medications   amLODipine-benazepril (LOTREL) 5-20 MG capsule        Follow up plan: Return in about 3 weeks (around 09/25/2022).

## 2022-09-04 NOTE — Assessment & Plan Note (Addendum)
Not under great control with A1c of 7.8. Will really watch his diet. Continue current regimen. Continue to monitor. Call with any concerns. Refills given. Follow up sugars in 3 months.

## 2022-09-04 NOTE — Assessment & Plan Note (Signed)
Will keep BP and cholesterol under good control. Continue to monitor. Call with any concerns.  

## 2022-09-04 NOTE — Telephone Encounter (Signed)
Most recent Diabetic Eye Exam was requested at today's visit.

## 2022-09-04 NOTE — Assessment & Plan Note (Signed)
Under good control on current regimen. Continue current regimen. Continue to monitor. Call with any concerns. Refills given. Labs drawn today.   

## 2022-09-04 NOTE — Telephone Encounter (Signed)
Patient most recent Diabetic Eye Exam was requested at today's visit.

## 2022-09-04 NOTE — Assessment & Plan Note (Signed)
Continue to follow with oncology. Call with any concerns. Continue to monitor.  

## 2022-09-05 LAB — COMPREHENSIVE METABOLIC PANEL
ALT: 13 IU/L (ref 0–44)
AST: 16 IU/L (ref 0–40)
Albumin/Globulin Ratio: 2.2 (ref 1.2–2.2)
Albumin: 4.4 g/dL (ref 3.8–4.8)
Alkaline Phosphatase: 73 IU/L (ref 44–121)
BUN/Creatinine Ratio: 17 (ref 10–24)
BUN: 18 mg/dL (ref 8–27)
Bilirubin Total: 0.3 mg/dL (ref 0.0–1.2)
CO2: 20 mmol/L (ref 20–29)
Calcium: 9.5 mg/dL (ref 8.6–10.2)
Chloride: 102 mmol/L (ref 96–106)
Creatinine, Ser: 1.09 mg/dL (ref 0.76–1.27)
Globulin, Total: 2 g/dL (ref 1.5–4.5)
Glucose: 140 mg/dL — ABNORMAL HIGH (ref 70–99)
Potassium: 4.6 mmol/L (ref 3.5–5.2)
Sodium: 138 mmol/L (ref 134–144)
Total Protein: 6.4 g/dL (ref 6.0–8.5)
eGFR: 73 mL/min/{1.73_m2} (ref >59)

## 2022-09-05 LAB — LIPID PANEL W/O CHOL/HDL RATIO
Cholesterol, Total: 159 mg/dL (ref 100–199)
HDL: 60 mg/dL (ref >39)
LDL Chol Calc (NIH): 73 mg/dL (ref 0–99)
Triglycerides: 156 mg/dL — ABNORMAL HIGH (ref 0–149)
VLDL Cholesterol Cal: 26 mg/dL (ref 5–40)

## 2022-09-05 LAB — CBC WITH DIFFERENTIAL/PLATELET
Basophils Absolute: 0 10*3/uL (ref 0.0–0.2)
Basos: 1 %
EOS (ABSOLUTE): 0.2 10*3/uL (ref 0.0–0.4)
Eos: 6 %
Hematocrit: 38.1 % (ref 37.5–51.0)
Hemoglobin: 12.5 g/dL — ABNORMAL LOW (ref 13.0–17.7)
Immature Grans (Abs): 0 10*3/uL (ref 0.0–0.1)
Immature Granulocytes: 0 %
Lymphocytes Absolute: 1 10*3/uL (ref 0.7–3.1)
Lymphs: 27 %
MCH: 32.1 pg (ref 26.6–33.0)
MCHC: 32.8 g/dL (ref 31.5–35.7)
MCV: 98 fL — ABNORMAL HIGH (ref 79–97)
Monocytes Absolute: 0.4 10*3/uL (ref 0.1–0.9)
Monocytes: 9 %
Neutrophils Absolute: 2.2 10*3/uL (ref 1.4–7.0)
Neutrophils: 57 %
Platelets: 167 10*3/uL (ref 150–450)
RBC: 3.89 x10E6/uL — ABNORMAL LOW (ref 4.14–5.80)
RDW: 13.2 % (ref 11.6–15.4)
WBC: 3.8 10*3/uL (ref 3.4–10.8)

## 2022-09-05 LAB — PSA: Prostate Specific Ag, Serum: <0.1 ng/mL (ref 0.0–4.0)

## 2022-09-05 LAB — TSH: TSH: 1.87 u[IU]/mL (ref 0.450–4.500)

## 2022-09-05 LAB — URIC ACID: Uric Acid: 4.7 mg/dL (ref 3.8–8.4)

## 2022-09-09 DIAGNOSIS — M5416 Radiculopathy, lumbar region: Secondary | ICD-10-CM | POA: Diagnosis not present

## 2022-09-16 DIAGNOSIS — M5416 Radiculopathy, lumbar region: Secondary | ICD-10-CM | POA: Diagnosis not present

## 2022-09-18 DIAGNOSIS — M5416 Radiculopathy, lumbar region: Secondary | ICD-10-CM | POA: Diagnosis not present

## 2022-09-22 ENCOUNTER — Ambulatory Visit
Admission: EM | Admit: 2022-09-22 | Discharge: 2022-09-22 | Disposition: A | Discharge status: Home / Self Care | Payer: Medicare Other | Attending: Emergency Medicine | Admitting: Emergency Medicine

## 2022-09-22 ENCOUNTER — Encounter: Payer: Self-pay | Admitting: Emergency Medicine

## 2022-09-22 DIAGNOSIS — Z79899 Other long term (current) drug therapy: Secondary | ICD-10-CM | POA: Diagnosis not present

## 2022-09-22 DIAGNOSIS — R630 Anorexia: Secondary | ICD-10-CM | POA: Diagnosis not present

## 2022-09-22 DIAGNOSIS — D72819 Decreased white blood cell count, unspecified: Secondary | ICD-10-CM | POA: Insufficient documentation

## 2022-09-22 DIAGNOSIS — I499 Cardiac arrhythmia, unspecified: Secondary | ICD-10-CM | POA: Diagnosis not present

## 2022-09-22 DIAGNOSIS — E1136 Type 2 diabetes mellitus with diabetic cataract: Secondary | ICD-10-CM | POA: Insufficient documentation

## 2022-09-22 DIAGNOSIS — Z1152 Encounter for screening for COVID-19: Secondary | ICD-10-CM | POA: Insufficient documentation

## 2022-09-22 DIAGNOSIS — R110 Nausea: Secondary | ICD-10-CM | POA: Diagnosis not present

## 2022-09-22 DIAGNOSIS — E871 Hypo-osmolality and hyponatremia: Secondary | ICD-10-CM | POA: Diagnosis not present

## 2022-09-22 LAB — CBC WITH DIFFERENTIAL/PLATELET
Abs Immature Granulocytes: 0.03 10*3/uL (ref 0.00–0.07)
Basophils Absolute: 0 10*3/uL (ref 0.0–0.1)
Basophils Relative: 1 %
Eosinophils Absolute: 0 10*3/uL (ref 0.0–0.5)
Eosinophils Relative: 0 %
HCT: 43.6 % (ref 39.0–52.0)
Hemoglobin: 15 g/dL (ref 13.0–17.0)
Immature Granulocytes: 1 %
Lymphocytes Relative: 30 %
Lymphs Abs: 1.1 10*3/uL (ref 0.7–4.0)
MCH: 32.7 pg (ref 26.0–34.0)
MCHC: 34.4 g/dL (ref 30.0–36.0)
MCV: 95 fL (ref 80.0–100.0)
Monocytes Absolute: 0.6 10*3/uL (ref 0.1–1.0)
Monocytes Relative: 18 %
Neutro Abs: 1.8 10*3/uL (ref 1.7–7.7)
Neutrophils Relative %: 50 %
Platelets: 214 10*3/uL (ref 150–400)
RBC: 4.59 MIL/uL (ref 4.22–5.81)
RDW: 14 % (ref 11.5–15.5)
WBC: 3.6 10*3/uL — ABNORMAL LOW (ref 4.0–10.5)
nRBC: 0 % (ref 0.0–0.2)

## 2022-09-22 LAB — SARS CORONAVIRUS 2 BY RT PCR: SARS Coronavirus 2 by RT PCR: NEGATIVE

## 2022-09-22 LAB — COMPREHENSIVE METABOLIC PANEL
ALT: 32 U/L (ref 0–44)
AST: 44 U/L — ABNORMAL HIGH (ref 15–41)
Albumin: 4.5 g/dL (ref 3.5–5.0)
Alkaline Phosphatase: 58 U/L (ref 38–126)
Anion gap: 13 (ref 5–15)
BUN: 21 mg/dL (ref 8–23)
CO2: 22 mmol/L (ref 22–32)
Calcium: 9.6 mg/dL (ref 8.9–10.3)
Chloride: 99 mmol/L (ref 98–111)
Creatinine, Ser: 1.11 mg/dL (ref 0.61–1.24)
GFR, Estimated: >60 mL/min (ref >60)
Glucose, Bld: 154 mg/dL — ABNORMAL HIGH (ref 70–99)
Potassium: 4.6 mmol/L (ref 3.5–5.1)
Sodium: 134 mmol/L — ABNORMAL LOW (ref 135–145)
Total Bilirubin: 0.3 mg/dL (ref 0.3–1.2)
Total Protein: 8.2 g/dL — ABNORMAL HIGH (ref 6.5–8.1)

## 2022-09-22 MED ORDER — ONDANSETRON 8 MG PO TBDP
8.0000 mg | ORAL_TABLET | Freq: Once | ORAL | Status: AC
  Administered 2022-09-22: 8 mg via ORAL

## 2022-09-22 MED ORDER — ONDANSETRON 8 MG PO TBDP: 8.0000 mg | ORAL_TABLET | Freq: Three times a day (TID) | ORAL | 0 refills | Status: DC | PRN

## 2022-09-22 NOTE — ED Provider Notes (Signed)
[facility]    CSN: [id number] Arrival date & time: 09/22/22  0954      History   Chief Complaint Chief Complaint  Patient presents with   Nausea   Loss of Appetite     HPI Troy Christensen is a 72 y.o. male.   HPI  72 year old male here for evaluation of nausea and loss of appetite.  The patient is here for evaluation of nausea with dry heaving and loss of appetite that has been going on for the past 2 days.  He states that he has had a little bit of orange juice and small bites of soft foods over the past couple days but really has not wanted to eat or drink.  He has been having diarrhea but states that that is baseline since he had radioactive seeds put in his prostate for prostate cancer.  He has not had any increase in diarrhea stools and has not seen any blood in his stools.  He denies vomiting.  He also denies any runny nose, nasal congestion, ear pain, sore throat, chest pain, cough, dizziness, sweating, or bodyaches.  His wife reports that the last time he felt like this he had COVID.  His past medical history is significant for type 2 diabetes that is controlled, prostate cancer, glaucoma, radiation proctitis, hypertension, osteoarthritis, hyperlipidemia, gout, GI bleed, malignant melanoma, bilateral cataracts, and colon polyps.  Past Medical History:  Diagnosis Date   Cancer [facility])    Prostate   Diabetes mellitus without complication ([place])    Glaucoma    Gout    Hypercholesteremia    Hypertension    Osteoarthritis    hands   Prostate cancer [facility])     Patient Active Problem List   Diagnosis Date Noted   Bilateral cataracts 09/24/2021   Family history of malignant melanoma 09/24/2021   Male erectile disorder 09/24/2021   History of colon polyps 09/24/2021   Shoulder pain with history of repair of rotator cuff 09/24/2021   History of total knee replacement, 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   Aortic atherosclerosis ([place]) 12/05/2020   Controlled diabetes mellitus type  2 with complications ([place]) 40/98/1191   COVID-19 03/12/2019   Advanced care planning/counseling discussion 10/15/2017   Prostate cancer ([place]) 07/14/2017   Angiodysplasia of intestine with hemorrhage    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   Glaucoma 11/20/2015   Gout 11/20/2015   Osteoarthritis 03/21/2015   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   Hypertension 03/21/2015   Hyperlipidemia 03/21/2015    Past Surgical History:  Procedure Laterality Date   EYE SURGERY Bilateral    march and april 2021- [place]    FLEXIBLE SIGMOIDOSCOPY N/A 01/10/2016   Procedure: FLEXIBLE SIGMOIDOSCOPY with  argon plasma coagulation;  Surgeon: Lucilla Lame, MD;  Location: [place];  Service: Endoscopy;  Laterality: N/A;  Diabetic - oral meds   HERNIA REPAIR     JOINT REPLACEMENT  2014   Right Knee Replacement   MENISCUS REPAIR Left    nephrolithiasis     pt denies   PROSTATE SURGERY     Radiation treatments - no surgery   RADIOACTIVE SEED IMPLANT N/A 03/25/2021   Procedure: RADIOACTIVE SEED IMPLANT/BRACHYTHERAPY IMPLANT;  Surgeon: Hollice Espy, MD;  Location: [facility];  Service: Urology;  Laterality: N/A;   RETINAL DETACHMENT SURGERY Bilateral    One surgery in March 2021, and Second in April 2021   [place] Right 2012       Home Medications    Prior  to Admission medications   Medication Sig Start Date End Date Taking? Authorizing Provider  ondansetron (ZOFRAN-ODT) 8 MG disintegrating tablet Take 1 tablet (8 mg total) by mouth every 8 (eight) hours as needed for nausea or vomiting. 09/22/22  Yes Margarette Canada, NP  allopurinol (ZYLOPRIM) 100 MG tablet Take 1 tablet (100 mg total) by mouth daily. 09/04/22   Johnson, [name] P, DO  amLODipine-benazepril (LOTREL) 5-20 MG capsule TAKE 2 CAPSULES ONCE EACH DAY 09/04/22   Johnson, [name] P, DO  aspirin EC 81 MG tablet Take 81 mg by mouth 3 (three) times a week. Pt states 2-3 times weekly    [provider]  baclofen  (LIORESAL) 10 MG tablet Take 0.5-1 tablets (5-10 mg total) by mouth at bedtime as needed for muscle spasms. 07/24/22   Johnson, [name] P, DO  dapagliflozin propanediol (FARXIGA) 10 MG TABS tablet Take 1 tablet (10 mg total) by mouth daily. 05/22/22   Johnson, [name] P, DO  glucose blood test strip 1 each by Other route as needed for other. Use contour next test strip to check sugar 2 times daily 05/20/17   Kathrine Haddock, NP  lovastatin (MEVACOR) 40 MG tablet TAKE 1 TABLET DAILY 07/23/22   Park Liter P, DO  meloxicam (MOBIC) 15 MG tablet Take 1 tablet (15 mg total) by mouth daily. 07/25/22   Johnson, [name] P, DO  metFORMIN (GLUCOPHAGE-XR) 500 MG 24 hr tablet Take 2 tablets (1,000 mg total) by mouth 2 (two) times daily. 09/04/22   Johnson, [name] P, DO  Omega-3 Fatty Acids (FISH OIL PO) Take 1 capsule by mouth daily.    [provider]  oxybutynin (DITROPAN XL) 15 MG 24 hr tablet Take 1 tablet (15 mg total) by mouth daily. 02/18/22   Hollice Espy, MD  pioglitazone (ACTOS) 45 MG tablet Take 1 tablet (45 mg total) by mouth daily. 09/04/22   Park Liter P, DO  pregabalin (LYRICA) 25 MG capsule Take 2 pill in the AM for 1 week, then increase to 2 pill BID until you see me 09/04/22   Park Liter P, DO  Semaglutide (RYBELSUS) 14 MG TABS Take 1 tablet (14 mg total) by mouth daily. 09/04/22   Johnson, [name] P, DO  sildenafil (REVATIO) 20 MG tablet Take 1-5 tablets (20-100 mg total) by mouth as needed. Patient taking differently: Take 80-100 mg by mouth daily as needed (ED). 01/30/21   Park Liter P, DO  tamsulosin (FLOMAX) 0.4 MG CAPS capsule TAKE 1 CAPSULE BY MOUTH IN THE MORNING AND AT BEDTIME. 06/30/22   Chrystal, Eulas Post, MD  timolol (TIMOPTIC) 0.5 % ophthalmic solution Place 1 drop into both eyes 2 (two) times daily. 01/07/21   [provider]    Family History Family History  Problem Relation Age of Onset   Cancer Father 37       lung   Emphysema Mother    Dementia Mother     Cancer Brother        melanoma    Social History Social History   Tobacco Use   Smoking status: Former    Types: Cigarettes    Quit date: 03/26/1975    Years since quitting: 47.5   Smokeless tobacco: Never  Vaping Use   Vaping Use: Never used  Substance Use Topics   Alcohol use: No    Alcohol/week: 0.0 standard drinks of alcohol   Drug use: No     Allergies   Gabapentin and Naproxen   Review of Systems Review of  Systems  Constitutional:  Negative for diaphoresis, fatigue and fever.  HENT:  Negative for congestion, ear pain, rhinorrhea and sore throat.   Respiratory:  Negative for cough, shortness of breath and wheezing.   Cardiovascular:  Negative for chest pain, palpitations and leg swelling.  Gastrointestinal:  Positive for diarrhea and nausea. Negative for abdominal pain, blood in stool and vomiting.  Neurological:  Negative for dizziness.     Physical Exam Triage Vital Signs ED Triage Vitals  Enc Vitals Group     BP      Pulse      Resp      Temp      Temp src      SpO2      Weight      Height      Head Circumference      Peak Flow      Pain Score      Pain Loc      Pain Edu?      Excl. in [place]?    No data found.  Updated Vital Signs BP 134/78 (BP Location: Left Arm)   Pulse 64   Temp 98.9 F (37.2 C) (Oral)   SpO2 98%   Visual Acuity Right Eye Distance:   Left Eye Distance:   Bilateral Distance:    Right Eye Near:   Left Eye Near:    Bilateral Near:     Physical Exam Vitals and nursing note reviewed.  Constitutional:      Appearance: Normal appearance. He is ill-appearing.  HENT:     Head: Normocephalic and atraumatic.     Right Ear: Tympanic membrane, ear canal and external ear normal. There is no impacted cerumen.     Left Ear: Tympanic membrane, ear canal and external ear normal. There is no impacted cerumen.     Nose: Nose normal. No congestion or rhinorrhea.     Mouth/Throat:     Mouth: Mucous membranes are dry.     Pharynx:  Oropharynx is clear. No oropharyngeal exudate or posterior oropharyngeal erythema.     Comments: Patient has sticky oral mucous membranes that are also pale in appearance.  Lips are dry. Eyes:     Extraocular Movements: Extraocular movements intact.     Pupils: Pupils are equal, round, and reactive to light.     Comments: Conjunctiva are mildly pale bilaterally.  Cardiovascular:     Rate and Rhythm: Normal rate. Rhythm irregular.     Pulses: Normal pulses.     Heart sounds: Normal heart sounds. No murmur heard.    No friction rub. No gallop.  Pulmonary:     Effort: Pulmonary effort is normal.     Breath sounds: Normal breath sounds. No wheezing, rhonchi or rales.  Musculoskeletal:     Cervical back: Normal range of motion and neck supple.  Lymphadenopathy:     Cervical: No cervical adenopathy.  Skin:    General: Skin is warm and dry.     Capillary Refill: Capillary refill takes less than 2 seconds.     Coloration: Skin is pale.     Comments: Mildly decreased skin turgor.  Neurological:     General: No focal deficit present.     Mental Status: He is alert and oriented to person, place, and time.  Psychiatric:        Mood and Affect: Mood normal.        Behavior: Behavior normal.        Thought Content: Thought content  normal.        Judgment: Judgment normal.      UC Treatments / Results  Labs (all labs ordered are listed, but only abnormal results are displayed) Labs Reviewed  CBC WITH DIFFERENTIAL/PLATELET - Abnormal; Notable for the following components:      Result Value   WBC 3.6 (*)    All other components within normal limits  COMPREHENSIVE METABOLIC PANEL - Abnormal; Notable for the following components:   Sodium 134 (*)    Glucose, Bld 154 (*)    Total Protein 8.2 (*)    AST 44 (*)    All other components within normal limits  SARS CORONAVIRUS 2 BY RT PCR    EKG Sinus rhythm with marked sinus arrhythmia and a ventricular rate of 68 bpm PR interval 148  ms QRS duration 94 ms QT/QTc 404/424 ms No ST or T wave abnormalities appreciated.  Radiology No results found.  Procedures Procedures (including critical care time)  Medications Ordered in UC Medications  ondansetron (ZOFRAN-ODT) disintegrating tablet 8 mg (8 mg Oral Given 09/22/22 1230)    Initial Impression / Assessment and Plan / UC Course  I have reviewed the triage vital signs and the nursing notes.  Pertinent labs & imaging results that were available during my care of the patient were reviewed by me and considered in my medical decision making (see chart for details).   Patient is a pleasant 72 year old male who is mildly pale and ill-appearing presenting for evaluation of 2 days worth of nausea with dry heaving and a loss of appetite.  He denies any upper respiratory symptoms, lower respiratory symptoms, or body aches.  He has had dry heaving but no overt vomiting.  He does have diarrhea but this is baseline for him and he has not had any increase in the number of stools nor has he seen blood in his stool.  On exam patient does have sticky oral mucous membranes and dry lips.  His conjunctiva and or mucous membranes are also pale in appearance.  He had a recent CBC and CMP with lipid panel on 09/04/2022.  His CMP showed a glucose of 140 but was otherwise unremarkable.  He had a low RBC count of 3.89 with a low hemoglobin of 12.5 at that time.  His hematocrit was normal at 38.1.  The patient's physical exam is largely unremarkable.  It was reported that his pulse rate was variable during vital signs acquisition so I ordered an EKG.  EKG shows sinus rhythm with marked sinus arrhythmia and possible left anterior fascicular block.  Previous EKG from 03/20/2021 showed sinus bradycardia with PACs.  I will check a CBC, CMP, and a COVID swab.  The patient denies any fatigue when questioned directly.  CBC shows a mildly low WBC count of 3.6.  RBC count is normal at 4.59.  H&H normal at 15.0 and  43.6 respectively.  Platelets are 214.  CMP shows mild hyponatremia with a sodium of 134.  Potassium is normal at 4.6.  Glucose is mildly elevated at 154.  Renal function shows a normal BUN of 21 and a creatinine of 1.11.  Total protein is mildly elevated 8.2 and AST is mildly elevated at 44.  Remainder transaminases are normal.  Lab ran the COVID PCR as a 4 [place] and patient is negative for COVID, influenza, and RSV.  Because of the patient's nausea and loss of appetite is unclear.  I will discharge him home with some Zofran that he  can use as needed for nausea and encouraged him to use Pedialyte, broth, ginger ale to help maintain oral hydration.  Bland diet.  If he develops any abdominal pain, fevers, nausea vomiting, bloody stools he needs to go to the ER for evaluation.  Final Clinical Impressions(s) / UC Diagnoses   Final diagnoses:  Nausea without vomiting     Discharge Instructions      Take the Zofran every 8 hours as needed for nausea and vomiting.  They are an oral disintegrating tablet and you can place them on her under your tongue and then will be absorbed.  Follow a clear liquid diet for the next 6 to 12 hours.  Clear liquids consist of broth, ginger ale, water, Pedialyte, and Jell-O.  After 6 to 12 hours, if you are tolerating clear liquids, you can advance to bland foods such as bananas, rice, applesauce, and toast.  If you tolerate bland foods you can continue to advance your diet as you see fit.  Make an appointment to see your primary care doctor later this week or early next for reevaluation.  If you develop a fever over 100.5, abdominal pain, bloody vomit, or bloody stool go to the ER.      ED Prescriptions     Medication Sig Dispense Auth. Provider   ondansetron (ZOFRAN-ODT) 8 MG disintegrating tablet Take 1 tablet (8 mg total) by mouth every 8 (eight) hours as needed for nausea or vomiting. 20 tablet Margarette Canada, NP      PDMP not reviewed this  encounter.   Margarette Canada, NP 09/22/22 1353

## 2022-09-22 NOTE — Discharge Instructions (Addendum)
Take the Zofran every 8 hours as needed for nausea and vomiting.  They are an oral disintegrating tablet and you can place them on her under your tongue and then will be absorbed.  Follow a clear liquid diet for the next 6 to 12 hours.  Clear liquids consist of broth, ginger ale, water, Pedialyte, and Jell-O.  After 6 to 12 hours, if you are tolerating clear liquids, you can advance to bland foods such as bananas, rice, applesauce, and toast.  If you tolerate bland foods you can continue to advance your diet as you see fit.  Make an appointment to see your primary care doctor later this week or early next for reevaluation.  If you develop a fever over 100.5, abdominal pain, bloody vomit, or bloody stool go to the ER.

## 2022-09-22 NOTE — ED Triage Notes (Signed)
Pt presents with loss of appetite and nausea x 2 days.

## 2022-09-25 ENCOUNTER — Encounter: Payer: Self-pay | Admitting: Internal Medicine

## 2022-09-25 ENCOUNTER — Ambulatory Visit (INDEPENDENT_AMBULATORY_CARE_PROVIDER_SITE_OTHER): Payer: Medicare Other | Admitting: Family Medicine

## 2022-09-25 ENCOUNTER — Encounter: Payer: Self-pay | Admitting: Family Medicine

## 2022-09-25 ENCOUNTER — Other Ambulatory Visit: Payer: Medicare Other

## 2022-09-25 VITALS — BP 109/66 | HR 85 | Temp 97.6°F | Ht 70.0 in | Wt 197.5 lb

## 2022-09-25 DIAGNOSIS — M5416 Radiculopathy, lumbar region: Secondary | ICD-10-CM

## 2022-09-25 DIAGNOSIS — R32 Unspecified urinary incontinence: Secondary | ICD-10-CM

## 2022-09-25 DIAGNOSIS — K529 Noninfective gastroenteritis and colitis, unspecified: Secondary | ICD-10-CM

## 2022-09-25 LAB — URINALYSIS, ROUTINE W REFLEX MICROSCOPIC
Bilirubin, UA: NEGATIVE
Ketones, UA: NEGATIVE
Leukocytes,UA: NEGATIVE
Nitrite, UA: NEGATIVE
Protein,UA: NEGATIVE
Specific Gravity, UA: 1.02 (ref 1.005–1.030)
Urobilinogen, Ur: 0.2 mg/dL (ref 0.2–1.0)
pH, UA: 5 (ref 5.0–7.5)

## 2022-09-25 LAB — MICROSCOPIC EXAMINATION
Bacteria, UA: NONE SEEN
WBC, UA: NONE SEEN /hpf (ref 0–5)

## 2022-09-25 MED ORDER — ONDANSETRON 8 MG PO TBDP: 8.0000 mg | ORAL_TABLET | Freq: Three times a day (TID) | ORAL | 0 refills | Status: DC | PRN

## 2022-09-25 MED ORDER — PROMETHAZINE HCL 50 MG/ML IJ SOLN
25.0000 mg | Freq: Once | INTRAMUSCULAR | Status: AC
  Administered 2022-09-25: 25 mg via INTRAMUSCULAR

## 2022-09-25 NOTE — Progress Notes (Signed)
BP 109/66   Pulse 85   Temp 97.6 F (36.4 C) (Oral)   Ht '5\' 10"'$  (1.778 m)   Wt 197 lb 8 oz (89.6 kg)   SpO2 96%   BMI 28.34 kg/m    Subjective:    Patient ID: Troy Christensen, male    DOB: [birth year], 72 y.o.   MRN: [id number]  HPI: Troy Christensen is a 72 y.o. male  No chief complaint on file.  GASTROENTERITIS Duration:3-4 days Diarrhea: yes non-bloody  Episodes of diarrhea/day: 3-4 Nausea: yes Vomiting: yes Episodes of vomit/day: none since Monday Abdominal pain: no Fever: no Decreased appetite: yes Tolerating liquids: yes Foreign travel: no Similar illness in contacts: no Recent antibiotic use: no Status: stable Treatments attempted: gatorade  Didn't get to go to PT due to illness this week. Has not noticed much of a difference with his lyrica. But is feeling better with the PT, although it is very short lived.  URINARY SYMPTOMS Duration: 3 days Dysuria: no Urinary frequency: yes Urgency: yes Small volume voids: no Symptom severity: severe Urinary incontinence: yes Foul odor: no Hematuria: no Abdominal pain: no Back pain: no Suprapubic pain/pressure: no Flank pain: no Fever:  no Vomiting: yes Relief with cranberry juice: no Relief with pyridium: no Status: worse History of sexually transmitted disease: no Penile discharge: no Treatments attempted: none   Relevant past medical, surgical, family and social history reviewed and updated as indicated. Interim medical history since our last visit reviewed. Allergies and medications reviewed and updated.  Review of Systems  Constitutional: Negative.   Respiratory: Negative.    Cardiovascular: Negative.   Gastrointestinal:  Positive for diarrhea, nausea and vomiting. Negative for abdominal distention, abdominal pain, anal bleeding, blood in stool, constipation and rectal pain.  Musculoskeletal:  Positive for myalgias. Negative for arthralgias, back pain, gait problem, joint swelling, neck pain and  neck stiffness.  Neurological:  Positive for numbness. Negative for dizziness, tremors, seizures, syncope, facial asymmetry, speech difficulty, weakness, light-headedness and headaches.  Psychiatric/Behavioral: Negative.      Per HPI unless specifically indicated above     Objective:    BP 109/66   Pulse 85   Temp 97.6 F (36.4 C) (Oral)   Ht '5\' 10"'$  (1.778 m)   Wt 197 lb 8 oz (89.6 kg)   SpO2 96%   BMI 28.34 kg/m   Wt Readings from Last 3 Encounters:  09/25/22 197 lb 8 oz (89.6 kg)  09/04/22 207 lb 9.6 oz (94.2 kg)  08/18/22 206 lb 3.2 oz (93.5 kg)    Physical Exam Vitals and nursing note reviewed.  Constitutional:      General: He is not in acute distress.    Appearance: Normal appearance. He is obese. He is ill-appearing. He is not toxic-appearing or diaphoretic.  HENT:     Head: Normocephalic and atraumatic.     Right Ear: External ear normal.     Left Ear: External ear normal.     Nose: Nose normal.     Mouth/Throat:     Mouth: Mucous membranes are moist.     Pharynx: Oropharynx is clear.  Eyes:     General: No scleral icterus.       Right eye: No discharge.        Left eye: No discharge.     Extraocular Movements: Extraocular movements intact.     Conjunctiva/sclera: Conjunctivae normal.     Pupils: Pupils are equal, round, and reactive to light.  Cardiovascular:  Rate and Rhythm: Normal rate and regular rhythm.     Pulses: Normal pulses.     Heart sounds: Normal heart sounds. No murmur heard.    No friction rub. No gallop.  Pulmonary:     Effort: Pulmonary effort is normal. No respiratory distress.     Breath sounds: Normal breath sounds. No stridor. No wheezing, rhonchi or rales.  Chest:     Chest wall: No tenderness.  Musculoskeletal:        General: Normal range of motion.     Cervical back: Normal range of motion and neck supple.  Skin:    General: Skin is warm and dry.     Capillary Refill: Capillary refill takes less than 2 seconds.      Coloration: Skin is not jaundiced or pale.     Findings: No bruising, erythema, lesion or rash.  Neurological:     General: No focal deficit present.     Mental Status: He is alert and oriented to person, place, and time. Mental status is at baseline.  Psychiatric:        Mood and Affect: Mood normal.        Behavior: Behavior normal.        Thought Content: Thought content normal.        Judgment: Judgment normal.     Results for orders placed or performed during the hospital encounter of 09/22/22  SARS Coronavirus 2 by RT PCR (hospital order, performed in [facility] hospital lab) *cepheid single result test* Anterior Nasal Swab   Specimen: Anterior Nasal Swab  Result Value Ref Range   SARS Coronavirus 2 by RT PCR NEGATIVE NEGATIVE  CBC with Differential  Result Value Ref Range   WBC 3.6 (L) 4.0 - 10.5 K/uL   RBC 4.59 4.22 - 5.81 MIL/uL   Hemoglobin 15.0 13.0 - 17.0 g/dL   HCT 43.6 39.0 - 52.0 %   MCV 95.0 80.0 - 100.0 fL   MCH 32.7 26.0 - 34.0 pg   MCHC 34.4 30.0 - 36.0 g/dL   RDW 14.0 11.5 - 15.5 %   Platelets 214 150 - 400 K/uL   nRBC 0.0 0.0 - 0.2 %   Neutrophils Relative % 50 %   Neutro Abs 1.8 1.7 - 7.7 K/uL   Lymphocytes Relative 30 %   Lymphs Abs 1.1 0.7 - 4.0 K/uL   Monocytes Relative 18 %   Monocytes Absolute 0.6 0.1 - 1.0 K/uL   Eosinophils Relative 0 %   Eosinophils Absolute 0.0 0.0 - 0.5 K/uL   Basophils Relative 1 %   Basophils Absolute 0.0 0.0 - 0.1 K/uL   Immature Granulocytes 1 %   Abs Immature Granulocytes 0.03 0.00 - 0.07 K/uL  Comprehensive metabolic panel  Result Value Ref Range   Sodium 134 (L) 135 - 145 mmol/L   Potassium 4.6 3.5 - 5.1 mmol/L   Chloride 99 98 - 111 mmol/L   CO2 22 22 - 32 mmol/L   Glucose, Bld 154 (H) 70 - 99 mg/dL   BUN 21 8 - 23 mg/dL   Creatinine, Ser 1.11 0.61 - 1.24 mg/dL   Calcium 9.6 8.9 - 10.3 mg/dL   Total Protein 8.2 (H) 6.5 - 8.1 g/dL   Albumin 4.5 3.5 - 5.0 g/dL   AST 44 (H) 15 - 41 U/L   ALT 32 0 - 44 U/L    Alkaline Phosphatase 58 38 - 126 U/L   Total Bilirubin 0.3 0.3 - 1.2 mg/dL  GFR, Estimated >60 >60 mL/min   Anion gap 13 5 - 15      Assessment & Plan:   Problem List Items Addressed This Visit       Nervous and Auditory   Lumbar radiculopathy    Has done about 3 weeks of PT without any concerns. Not feeling particularly better with the lyrica. Will continue lyrica and give him another few weeks with PT. Likely will need MRI. Follow up 3 weeks.      Other Visit Diagnoses     Gastroenteritis    -  Primary   On day 3-4. Phenergan given today. Rest. Fluids. If not getting better in 2-3 days, let us know and we'll do stool studies. Labs normal at ER.   Relevant Medications   promethazine (PHENERGAN) injection 25 mg (Completed)   Urinary incontinence, unspecified type       UA clear except 3+ glucose. Will send for culture. To see urology next week.   Relevant Orders   Urinalysis, Routine w reflex microscopic   Urine Culture        Follow up plan: Return in about 3 weeks (around 10/16/2022).

## 2022-09-25 NOTE — Assessment & Plan Note (Signed)
Has done about 3 weeks of PT without any concerns. Not feeling particularly better with the lyrica. Will continue lyrica and give him another few weeks with PT. Likely will need MRI. Follow up 3 weeks.

## 2022-09-26 DIAGNOSIS — M5416 Radiculopathy, lumbar region: Secondary | ICD-10-CM | POA: Diagnosis not present

## 2022-09-27 LAB — URINE CULTURE: Organism ID, Bacteria: NO GROWTH

## 2022-10-01 ENCOUNTER — Ambulatory Visit (INDEPENDENT_AMBULATORY_CARE_PROVIDER_SITE_OTHER): Payer: Medicare Other | Admitting: Urology

## 2022-10-01 ENCOUNTER — Ambulatory Visit: Payer: Medicare Other | Admitting: Urology

## 2022-10-01 ENCOUNTER — Encounter: Payer: Self-pay | Admitting: Urology

## 2022-10-01 VITALS — BP 119/71 | HR 66 | Ht 70.0 in | Wt 195.0 lb

## 2022-10-01 DIAGNOSIS — R3915 Urgency of urination: Secondary | ICD-10-CM

## 2022-10-01 DIAGNOSIS — C61 Malignant neoplasm of prostate: Secondary | ICD-10-CM | POA: Diagnosis not present

## 2022-10-01 DIAGNOSIS — R339 Retention of urine, unspecified: Secondary | ICD-10-CM | POA: Diagnosis not present

## 2022-10-01 DIAGNOSIS — N3941 Urge incontinence: Secondary | ICD-10-CM

## 2022-10-01 DIAGNOSIS — R350 Frequency of micturition: Secondary | ICD-10-CM | POA: Diagnosis not present

## 2022-10-01 LAB — BLADDER SCAN AMB NON-IMAGING

## 2022-10-01 MED ORDER — GEMTESA 75 MG PO TABS: 1.0000 | ORAL_TABLET | Freq: Every day | ORAL | 0 refills | Status: DC

## 2022-10-01 NOTE — Progress Notes (Signed)
I, Troy Christensen,acting as a scribe for Troy Espy, MD.,have documented all relevant documentation on the behalf of Troy Espy, MD,as directed by  Troy Espy, MD while in the presence of Troy Espy, MD.   I, Troy Christensen,acting as a scribe for Troy Espy, MD.,have documented all relevant documentation on the behalf of Troy Espy, MD,as directed by  Troy Espy, MD while in the presence of Troy Espy, MD.  10/01/2022 3:19 PM   [name] [birth year] [id number]  Referring provider: Valerie Roys, DO [place],  [place] [id number]  Chief Complaint  Patient presents with   Follow-up    1 year follow-up    HPI: 72 year-old male with a personal history of prostate cancer. He is status post IMRT with ADT and biochemical recurrence. He is also status post salvage brachytherapy in 2022. He returns today to review his urinary symptoms.   He was having significant issues with urgency and frequency. His medication were optimized to include oxybutynin XL 15 mg and Flomax. His last visit indicated that they seemed to control his symptoms reasonably well.  His last PSA on 09/04/2022 was undetectable.   Today, he reports urinary frequency, urgency, and incontinence. He notes that he is having to change his underwear at least twice while at work due to leakage. He denies any burning, gross hematuria, or concerns for infection.   Results for orders placed or performed in visit on 10/01/22  Bladder Scan (Post Void Residual) in office  Result Value Ref Range   Scan Result 92m     IPSS     Row Name 10/01/22 1000         International Prostate Symptom Score   How often have you had the sensation of not emptying your bladder? Less than 1 in 5     How often have you had to urinate less than every two hours? Almost always     How often have you found you stopped and started again several times when you urinated? About half the time     How often have  you found it difficult to postpone urination? Almost always     How often have you had a weak urinary stream? About half the time     How often have you had to strain to start urination? About half the time     How many times did you typically get up at night to urinate? 2 Times     Total IPSS Score 22       Quality of Life due to urinary symptoms   If you were to spend the rest of your life with your urinary condition just the way it is now how would you feel about that? Mostly Disatisfied              Score:  1-7 Mild 8-19 Moderate 20-35 Severe    PMH: Past Medical History:  Diagnosis Date   Cancer ([facility]    Prostate   Diabetes mellitus without complication ([facility]    Glaucoma    Gout    Hypercholesteremia    Hypertension    Osteoarthritis    hands   Prostate cancer ([facility]     Surgical History: Past Surgical History:  Procedure Laterality Date   EYE SURGERY Bilateral    march and april 2021- [place]    FLEXIBLE SIGMOIDOSCOPY N/A 01/10/2016   Procedure: FLEXIBLE SIGMOIDOSCOPY with  argon plasma coagulation;  Surgeon: DLucilla Lame MD;  Location: [facility];  Service: Endoscopy;  Laterality: N/A;  Diabetic - oral meds   HERNIA REPAIR     JOINT REPLACEMENT  2014   Right Knee Replacement   MENISCUS REPAIR Left    nephrolithiasis     pt denies   PROSTATE SURGERY     Radiation treatments - no surgery   RADIOACTIVE SEED IMPLANT N/A 03/25/2021   Procedure: RADIOACTIVE SEED IMPLANT/BRACHYTHERAPY IMPLANT;  Surgeon: Troy Espy, MD;  Location: [facility];  Service: Urology;  Laterality: N/A;   RETINAL DETACHMENT SURGERY Bilateral    One surgery in March 2021, and Second in April 2021   [place] Right 2012    Home Medications:  Allergies as of 10/01/2022       Reactions   Gabapentin Other (See Comments)   Urinary incontinence   Naproxen Other (See Comments)   Fatigue, patient states it slows him down         Medication List         Accurate as of October 01, 2022  3:19 PM. If you have any questions, ask your nurse or doctor.          STOP taking these medications    oxybutynin 15 MG 24 hr tablet Commonly known as: DITROPAN XL Stopped by: Troy Espy, MD       TAKE these medications    allopurinol 100 MG tablet Commonly known as: ZYLOPRIM Take 1 tablet (100 mg total) by mouth daily.   amLODipine-benazepril 5-20 MG capsule Commonly known as: LOTREL TAKE 2 CAPSULES ONCE EACH DAY   aspirin EC 81 MG tablet Take 81 mg by mouth 3 (three) times a week. Pt states 2-3 times weekly   baclofen 10 MG tablet Commonly known as: LIORESAL Take 0.5-1 tablets (5-10 mg total) by mouth at bedtime as needed for muscle spasms.   dapagliflozin propanediol 10 MG Tabs tablet Commonly known as: Farxiga Take 1 tablet (10 mg total) by mouth daily.   FISH OIL PO Take 1 capsule by mouth daily.   Gemtesa 75 MG Tabs Generic drug: Vibegron Take 1 tablet by mouth daily. Started by: Troy Espy, MD   glucose blood test strip 1 each by Other route as needed for other. Use contour next test strip to check sugar 2 times daily   lovastatin 40 MG tablet Commonly known as: MEVACOR TAKE 1 TABLET DAILY   meloxicam 15 MG tablet Commonly known as: MOBIC Take 1 tablet (15 mg total) by mouth daily.   metFORMIN 500 MG 24 hr tablet Commonly known as: GLUCOPHAGE-XR Take 2 tablets (1,000 mg total) by mouth 2 (two) times daily.   ondansetron 8 MG disintegrating tablet Commonly known as: ZOFRAN-ODT Take 1 tablet (8 mg total) by mouth every 8 (eight) hours as needed for nausea or vomiting.   pioglitazone 45 MG tablet Commonly known as: ACTOS Take 1 tablet (45 mg total) by mouth daily.   pregabalin 25 MG capsule Commonly known as: Lyrica Take 2 pill in the AM for 1 week, then increase to 2 pill BID until you see me   Rybelsus 14 MG Tabs Generic drug: Semaglutide Take 1 tablet (14 mg total) by mouth daily.   sildenafil  20 MG tablet Commonly known as: REVATIO Take 1-5 tablets (20-100 mg total) by mouth as needed. What changed:  how much to take when to take this reasons to take this   tamsulosin 0.4 MG Caps capsule Commonly known as: FLOMAX TAKE 1 CAPSULE BY MOUTH  IN THE MORNING AND AT BEDTIME.   timolol 0.5 % ophthalmic solution Commonly known as: TIMOPTIC Place 1 drop into both eyes 2 (two) times daily.        Allergies:  Allergies  Allergen Reactions   Gabapentin Other (See Comments)    Urinary incontinence   Naproxen Other (See Comments)    Fatigue, patient states it slows him down     Family History: Family History  Problem Relation Age of Onset   Cancer Father 20       lung   Emphysema Mother    Dementia Mother    Cancer Brother        melanoma    Social History:  reports that he quit smoking about 47 years ago. His smoking use included cigarettes. He has been exposed to tobacco smoke. He has never used smokeless tobacco. He reports that he does not drink alcohol and does not use drugs.  Physical Exam: BP 119/71   Pulse 66   Ht '5\' 10"'$  (1.778 m)   Wt 195 lb (88.5 kg)   BMI 27.98 kg/m   Constitutional:  Alert and oriented, No acute distress.  Accompanied by his wife today. HEENT: [place] AT, moist mucus membranes.  Trachea midline, no masses. Neurologic: Grossly intact, no focal deficits, moving all 4 extremities. Psychiatric: Normal mood and affect.  Assessment & Plan:    Urinary urgency/frequency/urge incontinence - Limited benefit to Oxybutynin. We'll try him on 6 weeks Gemtesa 75 mg.  -At the end of this trial he'll let us know if it is effective or not. He'll also work with his primary care to potentially adjust some of his diabetes medications, particularly his Wilder Glade which may be exacerbating some of his urinary symptoms, primarily irritative.  - Continue Flomax twice daily  2. Prostate Cancer - No evidence of disease. PSA in one year.   Return in about 1 year  (around 10/02/2023) for UA/IPSS/PVR/PSA.  I have reviewed the above documentation for accuracy and completeness, and I agree with the above.   Troy Espy, MD  [facility] [address], [place], [place] [address] [phone]

## 2022-10-06 DIAGNOSIS — M5416 Radiculopathy, lumbar region: Secondary | ICD-10-CM | POA: Diagnosis not present

## 2022-10-14 DIAGNOSIS — H35033 Hypertensive retinopathy, bilateral: Secondary | ICD-10-CM | POA: Diagnosis not present

## 2022-10-14 DIAGNOSIS — H34832 Tributary (branch) retinal vein occlusion, left eye, with macular edema: Secondary | ICD-10-CM | POA: Diagnosis not present

## 2022-10-14 DIAGNOSIS — H43813 Vitreous degeneration, bilateral: Secondary | ICD-10-CM | POA: Diagnosis not present

## 2022-10-14 DIAGNOSIS — E113213 Type 2 diabetes mellitus with mild nonproliferative diabetic retinopathy with macular edema, bilateral: Secondary | ICD-10-CM | POA: Diagnosis not present

## 2022-10-14 DIAGNOSIS — H401132 Primary open-angle glaucoma, bilateral, moderate stage: Secondary | ICD-10-CM | POA: Diagnosis not present

## 2022-10-16 ENCOUNTER — Encounter: Payer: Self-pay | Admitting: Family Medicine

## 2022-10-16 ENCOUNTER — Ambulatory Visit (INDEPENDENT_AMBULATORY_CARE_PROVIDER_SITE_OTHER): Payer: Medicare Other | Admitting: Family Medicine

## 2022-10-16 VITALS — BP 138/61 | HR 67 | Temp 97.8°F | Ht 70.0 in | Wt 204.2 lb

## 2022-10-16 DIAGNOSIS — E118 Type 2 diabetes mellitus with unspecified complications: Secondary | ICD-10-CM | POA: Diagnosis not present

## 2022-10-16 DIAGNOSIS — M5416 Radiculopathy, lumbar region: Secondary | ICD-10-CM

## 2022-10-16 DIAGNOSIS — R32 Unspecified urinary incontinence: Secondary | ICD-10-CM | POA: Diagnosis not present

## 2022-10-16 MED ORDER — PREGABALIN 75 MG PO CAPS: 75.0000 mg | ORAL_CAPSULE | Freq: Two times a day (BID) | ORAL | 1 refills | Status: DC

## 2022-10-16 NOTE — Progress Notes (Signed)
BP 138/61   Pulse 67   Temp 97.8 F (36.6 C) (Oral)   Ht '5\' 10"'$  (1.778 m)   Wt 204 lb 3.2 oz (92.6 kg)   SpO2 97%   BMI 29.30 kg/m    Subjective:    Patient ID: Troy Christensen, male    DOB: 10-27-1950, 72 y.o.   MRN: [id number]  HPI: Troy Christensen is a 72 y.o. male  Chief Complaint  Patient presents with   Lumbar Radiculopathy    Patient says he was told by Dr.Brandon to stop the [place] as it had too much sugar in it and wants to discuss with provider at today's visit.   Leg is doing OK. He notes that he's OK when he rests but hurts whenever he moves around. Better with lyrica and PT, but still not good. He notes that pain is shooting and numb and in his L leg. He would like to do an MRI and consider an ESI.   DIABETES-stopped his farxiga to see if that would help his incontinence. It has resolved, but he also started gemtessa. Unclear which fixed the problem Hypoglycemic episodes:no Polydipsia/polyuria: no Visual disturbance: no Chest pain: no Paresthesias: no Glucose Monitoring: no  Accucheck frequency: Not Checking Taking Insulin?: no Blood Pressure Monitoring: not checking Retinal Examination: Not up to Date Foot Exam: Up to Date Diabetic Education: Completed Pneumovax: Up to Date Influenza: Up to Date Aspirin: no  Relevant past medical, surgical, family and social history reviewed and updated as indicated. Interim medical history since our last visit reviewed. Allergies and medications reviewed and updated.  Review of Systems  Constitutional: Negative.   Respiratory: Negative.    Cardiovascular: Negative.   Gastrointestinal: Negative.   Genitourinary: Negative.   Musculoskeletal:  Positive for back pain and myalgias. Negative for arthralgias, gait problem, joint swelling, neck pain and neck stiffness.  Skin: Negative.   Psychiatric/Behavioral: Negative.      Per HPI unless specifically indicated above     Objective:    BP 138/61   Pulse 67    Temp 97.8 F (36.6 C) (Oral)   Ht '5\' 10"'$  (1.778 m)   Wt 204 lb 3.2 oz (92.6 kg)   SpO2 97%   BMI 29.30 kg/m   Wt Readings from Last 3 Encounters:  10/16/22 204 lb 3.2 oz (92.6 kg)  10/01/22 195 lb (88.5 kg)  09/25/22 197 lb 8 oz (89.6 kg)    Physical Exam Vitals and nursing note reviewed.  Constitutional:      General: He is not in acute distress.    Appearance: Normal appearance. He is not ill-appearing, toxic-appearing or diaphoretic.  HENT:     Head: Normocephalic and atraumatic.     Right Ear: External ear normal.     Left Ear: External ear normal.     Nose: Nose normal.     Mouth/Throat:     Mouth: Mucous membranes are moist.     Pharynx: Oropharynx is clear.  Eyes:     General: No scleral icterus.       Right eye: No discharge.        Left eye: No discharge.     Extraocular Movements: Extraocular movements intact.     Conjunctiva/sclera: Conjunctivae normal.     Pupils: Pupils are equal, round, and reactive to light.  Cardiovascular:     Rate and Rhythm: Normal rate and regular rhythm.     Pulses: Normal pulses.     Heart sounds: Normal heart  sounds. No murmur heard.    No friction rub. No gallop.  Pulmonary:     Effort: Pulmonary effort is normal. No respiratory distress.     Breath sounds: Normal breath sounds. No stridor. No wheezing, rhonchi or rales.  Chest:     Chest wall: No tenderness.  Musculoskeletal:        General: Normal range of motion.     Cervical back: Normal range of motion and neck supple.  Skin:    General: Skin is warm and dry.     Capillary Refill: Capillary refill takes less than 2 seconds.     Coloration: Skin is not jaundiced or pale.     Findings: No bruising, erythema, lesion or rash.  Neurological:     General: No focal deficit present.     Mental Status: He is alert and oriented to person, place, and time. Mental status is at baseline.  Psychiatric:        Mood and Affect: Mood normal.        Behavior: Behavior normal.         Thought Content: Thought content normal.        Judgment: Judgment normal.     Results for orders placed or performed in visit on 10/01/22  Bladder Scan (Post Void Residual) in office  Result Value Ref Range   Scan Result 62m       Assessment & Plan:   Problem List Items Addressed This Visit       Endocrine   Controlled diabetes mellitus type 2 with complications ([place]    Sugars stable at 7.8, but would be uncontrolled if we stop farxiga. Will see how he does back on it. If he starts with urinary issues again, consider stopping farxiga and rybelsus and changing to mounjaro. Call with any concerns.         Nervous and Auditory   Lumbar radiculopathy    Continues with pain. Has done PT with improvement, but not resolution. Would like to consider ESI. Will get MRI. Await results. Continue lyrica- will increase him to '75mg'$  BID.      Relevant Medications   pregabalin (LYRICA) 75 MG capsule   Other Relevant Orders   MR Lumbar Spine Wo Contrast   Other Visit Diagnoses     Urinary incontinence, unspecified type    -  Primary   Resolved with starting gemtessa and stopping farxiga- unclear which fixed the problem. Continue gemtessa and restart farxiga- watch for restarting incontinence.        Follow up plan: Return in about 2 months (around 12/15/2022).

## 2022-10-16 NOTE — Assessment & Plan Note (Signed)
Continues with pain. Has done PT with improvement, but not resolution. Would like to consider ESI. Will get MRI. Await results. Continue lyrica- will increase him to '75mg'$  BID.

## 2022-10-16 NOTE — Assessment & Plan Note (Signed)
Sugars stable at 7.8, but would be uncontrolled if we stop farxiga. Will see how he does back on it. If he starts with urinary issues again, consider stopping farxiga and rybelsus and changing to mounjaro. Call with any concerns.

## 2022-10-20 ENCOUNTER — Other Ambulatory Visit: Payer: Self-pay | Admitting: Family Medicine

## 2022-10-20 ENCOUNTER — Ambulatory Visit: Payer: Self-pay

## 2022-10-20 ENCOUNTER — Encounter: Payer: Self-pay | Admitting: Family Medicine

## 2022-10-20 MED ORDER — TIRZEPATIDE 10 MG/0.5ML ~~LOC~~ SOAJ: 10.0000 mg | SUBCUTANEOUS | 0 refills | Status: DC

## 2022-10-20 NOTE — Telephone Encounter (Signed)
OK to stop. I've listed it as an allergy. I'm going to send in the 1x a week shot. I want him to continue taking the rybelsus until he is able to get the new shot. Thanks.

## 2022-10-20 NOTE — Telephone Encounter (Signed)
  Chief Complaint: Pt has stopped taking Farxiga Symptoms: above Frequency:  Pertinent Negatives: Patient denies  Disposition: '[]'$ ED /'[]'$ Urgent Care (no appt availability in office) / '[]'$ Appointment(In office/virtual)/ '[]'$  [facility]/ '[]'$ Home Care/ '[]'$ Refused Recommended Disposition /'[]'$ [facility]/ '[x]'$  Follow-up with PCP Additional Notes: PT called to let you know that he has stopped taking [place]. PT states that he cannot control his urine while taking this medication. He states that his last few blood sugar readings were 11 and 117. He is not sure when they were. He states that he will continue to monitor his blood sugar readings closely.   Summary: side effects from [place]   Patient called in having problems with holding urine when taking Farxiga , says has to change his pants twice a day     Reason for Disposition  [1] Caller has URGENT medicine question about med that PCP or specialist prescribed AND [2] triager unable to answer question  Answer Assessment - Initial Assessment Questions 1. NAME of MEDICINE: "What medicine(s) are you calling about?"     Farxiga 2. QUESTION: "What is your question?" (e.g., double dose of medicine, side effect)      3. PRESCRIBER: "Who prescribed the medicine?" Reason: if prescribed by specialist, call should be referred to that group.     Dr. Wynetta Emery 4. SYMPTOMS: "Do you have any symptoms?" If Yes, ask: "What symptoms are you having?"  "How bad are the symptoms (e.g., mild, moderate, severe)     Unable to hold urine  Protocols used: Medication Question Call-A-AH

## 2022-10-21 NOTE — Telephone Encounter (Signed)
Left message for patient to give our office a call back to discuss Dr.Johnson's recommendations.    OK for [facility] to give note if patient calls back.  

## 2022-10-22 ENCOUNTER — Telehealth: Payer: Self-pay

## 2022-10-22 NOTE — Telephone Encounter (Signed)
Prior authorization was initiated via CoverMyMeds for prescription Mounjaro. Awaiting determination from patient's insurance.  VIF:[id number]

## 2022-10-23 ENCOUNTER — Ambulatory Visit (INDEPENDENT_AMBULATORY_CARE_PROVIDER_SITE_OTHER): Payer: Medicare Other | Admitting: Dermatology

## 2022-10-23 ENCOUNTER — Encounter: Payer: Self-pay | Admitting: Dermatology

## 2022-10-23 VITALS — BP 128/74 | HR 75

## 2022-10-23 DIAGNOSIS — L570 Actinic keratosis: Secondary | ICD-10-CM | POA: Diagnosis not present

## 2022-10-23 DIAGNOSIS — Z1283 Encounter for screening for malignant neoplasm of skin: Secondary | ICD-10-CM | POA: Diagnosis not present

## 2022-10-23 DIAGNOSIS — L821 Other seborrheic keratosis: Secondary | ICD-10-CM | POA: Diagnosis not present

## 2022-10-23 DIAGNOSIS — D225 Melanocytic nevi of trunk: Secondary | ICD-10-CM | POA: Diagnosis not present

## 2022-10-23 DIAGNOSIS — L578 Other skin changes due to chronic exposure to nonionizing radiation: Secondary | ICD-10-CM

## 2022-10-23 DIAGNOSIS — L814 Other melanin hyperpigmentation: Secondary | ICD-10-CM

## 2022-10-23 DIAGNOSIS — D229 Melanocytic nevi, unspecified: Secondary | ICD-10-CM

## 2022-10-23 NOTE — Patient Instructions (Addendum)
Cryotherapy Aftercare  Wash gently with soap and water everyday.   Apply Vaseline and Band-Aid daily until healed.     Due to recent changes in healthcare laws, you may see results of your pathology and/or laboratory studies on MyChart before the doctors have had a chance to review them. We understand that in some cases there may be results that are confusing or concerning to you. Please understand that not all results are received at the same time and often the doctors may need to interpret multiple results in order to provide you with the best plan of care or course of treatment. Therefore, we ask that you please give us 2 business days to thoroughly review all your results before contacting the office for clarification. Should we see a critical lab result, you will be contacted sooner.   If You Need Anything After Your Visit  If you have any questions or concerns for your doctor, please call our main line at [phone] and press option 4 to reach your doctor's medical assistant. If no one answers, please leave a voicemail as directed and we will return your call as soon as possible. Messages left after 4 pm will be answered the following business day.   You may also send us a message via MyChart. We typically respond to MyChart messages within 1-2 business days.  For prescription refills, please ask your pharmacy to contact our office. Our fax number is [phone].  If you have an urgent issue when the clinic is closed that cannot wait until the next business day, you can page your doctor at the number below.    Please note that while we do our best to be available for urgent issues outside of office hours, we are not available 24/7.   If you have an urgent issue and are unable to reach us, you may choose to seek medical care at your doctor's office, retail clinic, urgent care center, or emergency room.  If you have a medical emergency, please immediately call 911 or go to the  emergency department.  Pager Numbers  - Dr. Kowalski: [phone]  - Dr. Moye: [phone]  - Dr. Stewart: [phone]  In the event of inclement weather, please call our main line at [phone] for an update on the status of any delays or closures.  Dermatology Medication Tips: Please keep the boxes that topical medications come in in order to help keep track of the instructions about where and how to use these. Pharmacies typically print the medication instructions only on the boxes and not directly on the medication tubes.   If your medication is too expensive, please contact our office at [phone] option 4 or send us a message through MyChart.   We are unable to tell what your co-pay for medications will be in advance as this is different depending on your insurance coverage. However, we may be able to find a substitute medication at lower cost or fill out paperwork to get insurance to cover a needed medication.   If a prior authorization is required to get your medication covered by your insurance company, please allow us 1-2 business days to complete this process.  Drug prices often vary depending on where the prescription is filled and some pharmacies may offer cheaper prices.  The website www.goodrx.com contains coupons for medications through different pharmacies. The prices here do not account for what the cost may be with help from insurance (it may be cheaper with your insurance), but the website can   give you the price if you did not use any insurance.  - You can print the associated coupon and take it with your prescription to the pharmacy.  - You may also stop by our office during regular business hours and pick up a GoodRx coupon card.  - If you need your prescription sent electronically to a different pharmacy, notify our office through [facility] [place] MyChart or by phone at [phone] option 4.     Si Usted Necesita Algo Despus de Su Visita  Tambin puede  enviarnos un mensaje a travs de MyChart. Por lo general respondemos a los mensajes de MyChart en el transcurso de 1 a 2 das hbiles.  Para renovar recetas, por favor pida a su farmacia que se ponga en contacto con nuestra oficina. Nuestro nmero de fax es el [phone].  Si tiene un asunto urgente cuando la clnica est cerrada y que no puede esperar hasta el siguiente da hbil, puede llamar/localizar a su doctor(a) al nmero que aparece a continuacin.   Por favor, tenga en cuenta que aunque hacemos todo lo posible para estar disponibles para asuntos urgentes fuera del horario de oficina, no estamos disponibles las 24 horas del da, los 7 das de la semana.   Si tiene un problema urgente y no puede comunicarse con nosotros, puede optar por buscar atencin mdica  en el consultorio de su doctor(a), en una clnica privada, en un centro de atencin urgente o en una sala de emergencias.  Si tiene una emergencia mdica, por favor llame inmediatamente al 911 o vaya a la sala de emergencias.  Nmeros de bper  - Dr. Kowalski: [phone]  - Dra. Moye: [phone]  - Dra. Stewart: [phone]  En caso de inclemencias del tiempo, por favor llame a nuestra lnea principal al [phone] para una actualizacin sobre el estado de cualquier retraso o cierre.  Consejos para la medicacin en dermatologa: Por favor, guarde las cajas en las que vienen los medicamentos de uso tpico para ayudarle a seguir las instrucciones sobre dnde y cmo usarlos. Las farmacias generalmente imprimen las instrucciones del medicamento slo en las cajas y no directamente en los tubos del medicamento.   Si su medicamento es muy caro, por favor, pngase en contacto con nuestra oficina llamando al [phone] y presione la opcin 4 o envenos un mensaje a travs de MyChart.   No podemos decirle cul ser su copago por los medicamentos por adelantado ya que esto es diferente dependiendo de la cobertura de su seguro.  Sin embargo, es posible que podamos encontrar un medicamento sustituto a menor costo o llenar un formulario para que el seguro cubra el medicamento que se considera necesario.   Si se requiere una autorizacin previa para que su compaa de seguros cubra su medicamento, por favor permtanos de 1 a 2 das hbiles para completar este proceso.  Los precios de los medicamentos varan con frecuencia dependiendo del lugar de dnde se surte la receta y alguna farmacias pueden ofrecer precios ms baratos.  El sitio web www.goodrx.com tiene cupones para medicamentos de diferentes farmacias. Los precios aqu no tienen en cuenta lo que podra costar con la ayuda del seguro (puede ser ms barato con su seguro), pero el sitio web puede darle el precio si no utiliz ningn seguro.  - Puede imprimir el cupn correspondiente y llevarlo con su receta a la farmacia.  - Tambin puede pasar por nuestra oficina durante el horario de atencin regular y recoger una tarjeta de cupones de GoodRx.  -   Si necesita que su receta se enve electrnicamente a una farmacia diferente, informe a nuestra oficina a travs de MyChart de [place] o por telfono llamando al [phone] y presione la opcin 4.  

## 2022-10-23 NOTE — Progress Notes (Signed)
   Follow-Up Visit   Subjective  Troy Christensen is a 72 y.o. male who presents for the following: Total body skin exam (Hx of AKS). Patient accompanied by wife. The patient presents for Total-Body Skin Exam (TBSE) for skin cancer screening and mole check.  The patient has spots, moles and lesions to be evaluated, some may be new or changing and the patient has concerns that these could be cancer.   The following portions of the chart were reviewed this encounter and updated as appropriate:   Tobacco  Allergies  Meds  Problems  Med Hx  Surg Hx  Fam Hx     Review of Systems:  No other skin or systemic complaints except as noted in HPI or Assessment and Plan.  Objective  Well appearing patient in no apparent distress; mood and affect are within normal limits.  A full examination was performed including scalp, head, eyes, ears, nose, lips, neck, chest, axillae, abdomen, back, buttocks, bilateral upper extremities, bilateral lower extremities, hands, feet, fingers, toes, fingernails, and toenails. All findings within normal limits unless otherwise noted below.  Scalp x 4 (4) Pink scaly macules   Assessment & Plan   Lentigines - Scattered tan macules - Due to sun exposure - Benign-appearing, observe - Recommend daily broad spectrum sunscreen SPF 30+ to sun-exposed areas, reapply every 2 hours as needed. - Call for any changes - upper back  Seborrheic Keratoses - Stuck-on, waxy, tan-brown papules and/or plaques  - Benign-appearing - Discussed benign etiology and prognosis. - Observe - Call for any changes - back  Melanocytic Nevi - Tan-brown and/or pink-flesh-colored symmetric macules and papules - Benign appearing on exam today - Observation - Call clinic for new or changing moles - Recommend daily use of broad spectrum spf 30+ sunscreen to sun-exposed areas.  - trunk  Hemangiomas - Red papules - Discussed benign nature - Observe - Call for any changes -  trunk  Actinic Damage - Chronic condition, secondary to cumulative UV/sun exposure - diffuse scaly erythematous macules with underlying dyspigmentation - Recommend daily broad spectrum sunscreen SPF 30+ to sun-exposed areas, reapply every 2 hours as needed.  - Staying in the shade or wearing long sleeves, sun glasses (UVA+UVB protection) and wide brim hats (4-inch brim around the entire circumference of the hat) are also recommended for sun protection.  - Call for new or changing lesions.  Skin cancer screening performed today.   AK (actinic keratosis) (4) Scalp x 4  Destruction of lesion - Scalp x 4 Complexity: simple   Destruction method: cryotherapy   Informed consent: discussed and consent obtained   Timeout:  patient name, date of birth, surgical site, and procedure verified Lesion destroyed using liquid nitrogen: Yes   Region frozen until ice ball extended beyond lesion: Yes   Outcome: patient tolerated procedure well with no complications   Post-procedure details: wound care instructions given     Return in about 1 year (around 10/24/2023) for TBSE, Hx of AKs.  I, Othelia Pulling, RMA, am acting as scribe for Sarina Ser, MD . Documentation: I have reviewed the above documentation for accuracy and completeness, and I agree with the above.  Sarina Ser, MD

## 2022-10-23 NOTE — Telephone Encounter (Signed)
PA was approved for [facility].

## 2022-10-24 ENCOUNTER — Telehealth: Payer: Self-pay | Admitting: Family Medicine

## 2022-10-24 NOTE — Telephone Encounter (Signed)
Copied from [place]. Topic: General - Inquiry >> Oct 24, 2022  2:19 PM Marcellus Scott wrote: Reason for CRM: Pt stated he received a call yesterday regarding scheduling his MRI. He stated he was advised he would need to be sedated, and they advised they would be reaching out to Dr.Johnson.  Pt is calling to follow up has no further information.  Please advise.

## 2022-10-27 MED ORDER — ALPRAZOLAM 0.25 MG PO TABS: ORAL_TABLET | ORAL | 0 refills | Status: DC

## 2022-10-27 NOTE — Telephone Encounter (Signed)
Rx sent to his pharmacy- please have him schedule his MRI

## 2022-10-27 NOTE — Telephone Encounter (Signed)
Left message for patient to give our office a call back to discuss Dr.Johnson's recommendations.    OK for [facility] to give note if patient calls back.

## 2022-10-28 NOTE — Telephone Encounter (Signed)
Reached out to patient via [place]. Advised patient to give our office a call or send [place] a message via [place].

## 2022-10-28 NOTE — Telephone Encounter (Signed)
Reached out to patient via [place]. Advised patient to give our office a call or send [place] a message via [place].

## 2022-11-03 ENCOUNTER — Encounter: Payer: Self-pay | Admitting: Dermatology

## 2022-11-04 ENCOUNTER — Telehealth: Payer: Self-pay | Admitting: Urology

## 2022-11-04 ENCOUNTER — Ambulatory Visit
Admission: RE | Admit: 2022-11-04 | Discharge: 2022-11-04 | Disposition: A | Discharge status: Home / Self Care | Payer: Medicare Other | Source: Ambulatory Visit | Attending: Family Medicine | Admitting: Family Medicine

## 2022-11-04 ENCOUNTER — Telehealth: Payer: Self-pay

## 2022-11-04 DIAGNOSIS — M5416 Radiculopathy, lumbar region: Secondary | ICD-10-CM

## 2022-11-04 DIAGNOSIS — M5126 Other intervertebral disc displacement, lumbar region: Secondary | ICD-10-CM | POA: Diagnosis not present

## 2022-11-04 MED ORDER — GEMTESA 75 MG PO TABS: 1.0000 | ORAL_TABLET | Freq: Every day | ORAL | 11 refills | Status: DC

## 2022-11-04 NOTE — Telephone Encounter (Signed)
Patient called to let Dr. Erlene Quan know that Troy Christensen is working well and he would like prescription sent to [facility].

## 2022-11-04 NOTE — Telephone Encounter (Signed)
Yes okay to call it in.  If he gets there and is prohibitively expensive, have him check his insurance formulary let us know if there is a cheaper alternative.  Also please let him know that if he is having any issues feel like he cannot empty his bladder on the medication, please let us know ASAP.  Hollice Espy, MD

## 2022-11-04 NOTE — Telephone Encounter (Signed)
RX sent in and patient advised.

## 2022-11-04 NOTE — Telephone Encounter (Unsigned)
Copied from [place] [id number]. Topic: General - Other >> Nov 04, 2022  4:13 PM Chapman Fitch wrote: Reason for CRM: Pt called to get the results to his MRI ./ please advise

## 2022-11-04 NOTE — Telephone Encounter (Signed)
Ok to send in.  

## 2022-11-05 NOTE — Telephone Encounter (Signed)
MRI results are not in. Still needs to be read by radiology.

## 2022-11-05 NOTE — Telephone Encounter (Signed)
Patient notified and verbalized understanding. 

## 2022-11-06 ENCOUNTER — Ambulatory Visit: Payer: Self-pay | Admitting: *Deleted

## 2022-11-06 ENCOUNTER — Other Ambulatory Visit: Payer: Self-pay | Admitting: Family Medicine

## 2022-11-06 DIAGNOSIS — M5416 Radiculopathy, lumbar region: Secondary | ICD-10-CM

## 2022-11-06 NOTE — Telephone Encounter (Signed)
Reason for Disposition  Health Information question, no triage required and triager able to answer question  Answer Assessment - Initial Assessment Questions 1. REASON FOR CALL or QUESTION: "What is your reason for calling today?" or "How can I best help you?" or "What question do you have that I can help answer?"     Pt called in for his results from the scan done on his lower back. I read him the message from Dr. Wynetta Emery dated 11/06/2022 at 9:05 AM.  No questions from pt  Protocols used: Information Only Call - No Triage-A-AH

## 2022-11-06 NOTE — Telephone Encounter (Signed)
  Chief Complaint: Called in for MRI results of lower spine.  Read him the message from Dr. Wynetta Emery dated 11/06/2022 at 9:05 AM Symptoms: N/A Frequency: N/A Pertinent Negatives: Patient denies N/A Disposition: []$ ED /[]$ Urgent Care (no appt availability in office) / []$ Appointment(In office/virtual)/ []$  [place] [facility]/ [x]$ Home Care/ []$ Refused Recommended Disposition /[]$ [place] [facility]/ []$  Follow-up with PCP Additional Notes:

## 2022-11-07 ENCOUNTER — Telehealth: Payer: Self-pay

## 2022-11-07 MED ORDER — TROSPIUM CHLORIDE 20 MG PO TABS: 20.0000 mg | ORAL_TABLET | Freq: Two times a day (BID) | ORAL | 0 refills | Status: DC

## 2022-11-07 NOTE — Telephone Encounter (Signed)
Was Gemtesa effective?  Lets try Trospium 20 mg bid daily for 30 days and please have him let us know how it goes.  Hollice Espy, MD

## 2022-11-07 NOTE — Telephone Encounter (Signed)
Troy Christensen is not covered by his plan/insurance. Alternative medications are Oxybutinin, Trospium, Tolterodine.

## 2022-11-07 NOTE — Telephone Encounter (Signed)
Patient states Troy Christensen was affective. Advised new RX is been sent in for Trospium and to call us back in about a month on how he is doing.

## 2022-11-10 NOTE — Telephone Encounter (Signed)
Pt stated [facility] (Dr. Phyllis Ginger) is not covered by his insurance. Pt is requesting a referral be sent to [facility].  Please advise.

## 2022-11-10 NOTE — Telephone Encounter (Signed)
Can we please change referral based on below.

## 2022-12-01 ENCOUNTER — Other Ambulatory Visit: Payer: Self-pay | Admitting: Family Medicine

## 2022-12-02 ENCOUNTER — Telehealth: Payer: Self-pay

## 2022-12-02 DIAGNOSIS — H34832 Tributary (branch) retinal vein occlusion, left eye, with macular edema: Secondary | ICD-10-CM | POA: Diagnosis not present

## 2022-12-02 DIAGNOSIS — H401132 Primary open-angle glaucoma, bilateral, moderate stage: Secondary | ICD-10-CM | POA: Diagnosis not present

## 2022-12-02 DIAGNOSIS — E113213 Type 2 diabetes mellitus with mild nonproliferative diabetic retinopathy with macular edema, bilateral: Secondary | ICD-10-CM | POA: Diagnosis not present

## 2022-12-02 DIAGNOSIS — H35033 Hypertensive retinopathy, bilateral: Secondary | ICD-10-CM | POA: Diagnosis not present

## 2022-12-02 DIAGNOSIS — H43813 Vitreous degeneration, bilateral: Secondary | ICD-10-CM | POA: Diagnosis not present

## 2022-12-02 LAB — HM DIABETES EYE EXAM

## 2022-12-02 MED ORDER — TROSPIUM CHLORIDE 20 MG PO TABS: 20.0000 mg | ORAL_TABLET | Freq: Two times a day (BID) | ORAL | 11 refills | Status: DC

## 2022-12-02 NOTE — Telephone Encounter (Signed)
Requested medication (s) are due for refill today: yes  Requested medication (s) are on the active medication list: yes  Last refill:  10/16/22  Future visit scheduled: yes  Notes to clinic:  Unable to refill per protocol, cannot delegate.      Requested Prescriptions  Pending Prescriptions Disp Refills   pregabalin (LYRICA) 75 MG capsule [Pharmacy Med Name: PREGABALIN 75 MG CAP] 60 capsule     Sig: TAKE 1 CAPSULE BY MOUTH TWICE DAILY     Not Delegated - Neurology:  Anticonvulsants - Controlled - pregabalin Failed - 12/01/2022  3:03 PM      Failed - This refill cannot be delegated      Passed - Cr in normal range and within 360 days    Creatinine, Ser  Date Value Ref Range Status  09/22/2022 1.11 0.61 - 1.24 mg/dL Final         Passed - Completed PHQ-2 or PHQ-9 in the last 360 days      Passed - Valid encounter within last 12 months    Recent Outpatient Visits           1 month ago Urinary incontinence, unspecified type   [place], [place], DO   2 months ago [place], [place], DO   2 months ago Lumbar radiculopathy   [place], [place], DO   3 months ago Lumbar radiculopathy   [place], [place], DO   3 months ago Left leg pain   [place], Barb Merino, DO       Future Appointments             In 2 weeks Wynetta Emery, Barb Mer[place], DO [place], [place]   In 10 months Hollice Espy, MD [facility] [place]   In 11 months Ralene Bathe, MD [place]

## 2022-12-02 NOTE — Telephone Encounter (Signed)
Call received via triage line  Pt states the med- trospium 20 mg is working great. He would like [facility]ill [facility].   Pt aware we will respond in 48 hours.   Pls advise

## 2022-12-02 NOTE — Telephone Encounter (Signed)
[facility]  Hollice Espy, MD

## 2022-12-02 NOTE — Telephone Encounter (Signed)
Patient advised.

## 2022-12-11 DIAGNOSIS — M4727 Other spondylosis with radiculopathy, lumbosacral region: Secondary | ICD-10-CM | POA: Diagnosis not present

## 2022-12-11 DIAGNOSIS — M5442 Lumbago with sciatica, left side: Secondary | ICD-10-CM | POA: Diagnosis not present

## 2022-12-11 DIAGNOSIS — M7918 Myalgia, other site: Secondary | ICD-10-CM | POA: Diagnosis not present

## 2022-12-12 ENCOUNTER — Other Ambulatory Visit: Payer: Self-pay | Admitting: Urology

## 2022-12-15 ENCOUNTER — Ambulatory Visit: Payer: Medicare Other | Admitting: Family Medicine

## 2022-12-16 ENCOUNTER — Telehealth: Payer: Self-pay

## 2022-12-16 ENCOUNTER — Ambulatory Visit (INDEPENDENT_AMBULATORY_CARE_PROVIDER_SITE_OTHER): Payer: Medicare Other | Admitting: Family Medicine

## 2022-12-16 ENCOUNTER — Encounter: Payer: Self-pay | Admitting: Family Medicine

## 2022-12-16 VITALS — BP 108/68 | HR 63 | Temp 98.0°F | Ht 70.0 in | Wt 209.9 lb

## 2022-12-16 DIAGNOSIS — R32 Unspecified urinary incontinence: Secondary | ICD-10-CM | POA: Diagnosis not present

## 2022-12-16 DIAGNOSIS — I700 Atherosclerosis of aorta: Secondary | ICD-10-CM

## 2022-12-16 DIAGNOSIS — E118 Type 2 diabetes mellitus with unspecified complications: Secondary | ICD-10-CM

## 2022-12-16 LAB — URINALYSIS, ROUTINE W REFLEX MICROSCOPIC
Bilirubin, UA: NEGATIVE
Glucose, UA: NEGATIVE
Ketones, UA: NEGATIVE
Leukocytes,UA: NEGATIVE
Nitrite, UA: NEGATIVE
RBC, UA: NEGATIVE
Specific Gravity, UA: 1.02 (ref 1.005–1.030)
Urobilinogen, Ur: 0.2 mg/dL (ref 0.2–1.0)
pH, UA: 5 (ref 5.0–7.5)

## 2022-12-16 LAB — BAYER DCA HB A1C WAIVED: HB A1C (BAYER DCA - WAIVED): 8.3 % — ABNORMAL HIGH (ref 4.8–5.6)

## 2022-12-16 MED ORDER — TIRZEPATIDE 10 MG/0.5ML ~~LOC~~ SOAJ: 10.0000 mg | SUBCUTANEOUS | 3 refills | Status: DC

## 2022-12-16 NOTE — Assessment & Plan Note (Signed)
Not under good control with A1c of 8.3 up from 7.8. Did not start his mounjaro. Will start mounjaro and stop rybelsus. Recheck 1 month. Call with any concerns.

## 2022-12-16 NOTE — Telephone Encounter (Signed)
Patient most recent Diabetic Eye Exam was requested per Dr Durenda Age request.

## 2022-12-16 NOTE — Assessment & Plan Note (Signed)
Will keep BP and cholesterol under good control. Continue to monitor. Call with any concerns.  

## 2022-12-16 NOTE — Progress Notes (Signed)
BP 108/68   Pulse 63   Temp 98 F (36.7 C) (Oral)   Ht 5\' 10"  (1.778 m)   Wt 209 lb 14.4 oz (95.2 kg)   SpO2 97%   BMI 30.12 kg/m    Subjective:    Patient ID: Troy Christensen, male    DOB: Nov 01, 1950, 72 y.o.   MRN: [id number]  HPI: Troy Christensen is a 72 y.o. male  Chief Complaint  Patient presents with   Diabetes    Patient declines having a recent Diabetic Eye Exam. 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    URINARY SYMPTOMS- Has been having incontinence since having his shot in his hip on Thursday.  Duration: about 5 days Dysuria: no Urinary frequency: no Urgency: yes Small volume voids: no Symptom severity: severe Urinary incontinence: yes Foul odor: no Hematuria: no Abdominal pain: no Back pain: no Suprapubic pain/pressure: no Flank pain: no Fever:  no Vomiting: no Relief with cranberry juice: no Relief with pyridium: no Status: worse Previous urinary tract infection: yes Recurrent urinary tract infection: no History of sexually transmitted disease: no Penile discharge: no Treatments attempted: none   DIABETES- did not start his mounjaro. Has been continuing his rybelsus. Hypoglycemic episodes:no Polydipsia/polyuria: yes Visual disturbance: yes Chest pain: no Paresthesias: yes Glucose Monitoring: no Taking Insulin?: no Blood Pressure Monitoring: not checking Retinal Examination: Up to Date Foot Exam: Up to Date Diabetic Education: Completed Pneumovax: Up to Date Influenza: Up to Date Aspirin: yes  Relevant past medical, surgical, family and social history reviewed and updated as indicated. Interim medical history since our last visit reviewed. Allergies and medications reviewed and updated.  Review of Systems  Constitutional: Negative.   Respiratory: Negative.    Cardiovascular: Negative.   Gastrointestinal:  Negative.   Genitourinary:  Positive for enuresis and urgency. Negative for decreased urine volume, difficulty urinating, dysuria, flank pain, frequency, genital sores, hematuria, penile discharge, penile pain, penile swelling, scrotal swelling and testicular pain.  Musculoskeletal: Negative.   Neurological: Negative.   Psychiatric/Behavioral: Negative.      Per HPI unless specifically indicated above     Objective:    BP 108/68   Pulse 63   Temp 98 F (36.7 C) (Oral)   Ht 5\' 10"  (1.778 m)   Wt 209 lb 14.4 oz (95.2 kg)   SpO2 97%   BMI 30.12 kg/m   Wt Readings from Last 3 Encounters:  12/16/22 209 lb 14.4 oz (95.2 kg)  10/16/22 204 lb 3.2 oz (92.6 kg)  10/01/22 195 lb (88.5 kg)    Physical Exam Vitals and nursing note reviewed.  Constitutional:      General: He is not in acute distress.    Appearance: Normal appearance. He is not ill-appearing, toxic-appearing or diaphoretic.  HENT:     Head: Normocephalic and atraumatic.     Right Ear: External ear normal.     Left Ear: External ear normal.     Nose: Nose normal.     Mouth/Throat:     Mouth: Mucous membranes are moist.     Pharynx: Oropharynx is clear.  Eyes:     General: No scleral icterus.       Right eye: No discharge.        Left eye: No discharge.     Extraocular Movements: Extraocular movements intact.     Conjunctiva/sclera: Conjunctivae  normal.     Pupils: Pupils are equal, round, and reactive to light.  Cardiovascular:     Rate and Rhythm: Normal rate and regular rhythm.     Pulses: Normal pulses.     Heart sounds: Normal heart sounds. No murmur heard.    No friction rub. No gallop.  Pulmonary:     Effort: Pulmonary effort is normal. No respiratory distress.     Breath sounds: Normal breath sounds. No stridor. No wheezing, rhonchi or rales.  Chest:     Chest wall: No tenderness.  Musculoskeletal:        General: Normal range of motion.     Cervical back: Normal range of motion and neck supple.   Skin:    General: Skin is warm and dry.     Capillary Refill: Capillary refill takes less than 2 seconds.     Coloration: Skin is not jaundiced or pale.     Findings: No bruising, erythema, lesion or rash.  Neurological:     General: No focal deficit present.     Mental Status: He is alert and oriented to person, place, and time. Mental status is at baseline.  Psychiatric:        Mood and Affect: Mood normal.        Behavior: Behavior normal.        Thought Content: Thought content normal.        Judgment: Judgment normal.     Results for orders placed or performed in visit on 12/16/22  Bayer DCA Hb A1c Waived  Result Value Ref Range   HB A1C (BAYER DCA - WAIVED) 8.3 (H) 4.8 - 5.6 %  Urinalysis, Routine w reflex microscopic  Result Value Ref Range   Specific Gravity, UA 1.020 1.005 - 1.030   pH, UA 5.0 5.0 - 7.5   Color, UA Yellow Yellow   Appearance Ur Clear Clear   Leukocytes,UA Negative Negative   Protein,UA Trace (A) Negative/Trace   Glucose, UA Negative Negative   Ketones, UA Negative Negative   RBC, UA Negative Negative   Bilirubin, UA Negative Negative   Urobilinogen, Ur 0.2 0.2 - 1.0 mg/dL   Nitrite, UA Negative Negative   Microscopic Examination Comment       Assessment & Plan:   Problem List Items Addressed This Visit       Cardiovascular and Mediastinum   Aortic atherosclerosis    Will keep BP and cholesterol under good control. Continue to monitor. Call with any concerns.         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 Did not start his mounjaro. Will start mounjaro and stop rybelsus. Recheck 1 month. Call with any concerns.       Relevant Medications   tirzepatide (MOUNJARO) 10 MG/0.5ML Pen   Other Relevant Orders   Bayer DCA Hb A1c Waived (Completed)   Other Visit Diagnoses     Urinary incontinence, unspecified type       ?due to hyperglycemia. Urine  clear today. Will get his sugars under better control. Continue to monitor. Recheck 1 month.   Relevant Orders   Urinalysis, Routine w reflex microscopic (Completed)        Follow up plan: Return in about 4 weeks (around 01/13/2023).

## 2022-12-16 NOTE — Telephone Encounter (Signed)
Spoke with Dorothea Ogle with [facility] and got prescription for Rybelsus canceled. Dorothea Ogle verbalized understanding.

## 2022-12-16 NOTE — Telephone Encounter (Signed)
-----   Message from Valerie Roys, [place] sent at 12/16/2022  1:09 PM EDT ----- Please [facility]. Thanks!

## 2022-12-16 NOTE — Telephone Encounter (Signed)
-----   Message from Valerie Roys, DO sent at 12/16/2022  9:17 AM EDT ----- [facility]- sees them regularly

## 2022-12-23 DIAGNOSIS — E113212 Type 2 diabetes mellitus with mild nonproliferative diabetic retinopathy with macular edema, left eye: Secondary | ICD-10-CM | POA: Diagnosis not present

## 2023-01-02 ENCOUNTER — Telehealth: Payer: Self-pay | Admitting: Family Medicine

## 2023-01-02 NOTE — Telephone Encounter (Signed)
Pt is calling because he says he went to [facility] to get a prescription for Pregabaline 75 mg filled but he was out of refills. Pt says [facility] faxed over a request but they haven't heard anything. Pt is requesting someone follow up with him regarding this. Please advise.

## 2023-01-02 NOTE — Telephone Encounter (Signed)
Is the patient supposed to be on this medication? Not listed on current medication list.

## 2023-01-02 NOTE — Telephone Encounter (Signed)
Medication Refill - Medication: pregabalin (LYRICA) 75 MG capsule   Has the patient contacted their pharmacy? Yes.    Preferred Pharmacy (with phone number or street name):  [facility] - [place], [place] - [address] Phone: [phone]  Fax: [phone]     Has the patient been seen for an appointment in the last year OR does the patient have an upcoming appointment? Yes.    Agent: Please be advised that RX refills may take up to 3 business days. We ask that you follow-up with your pharmacy.

## 2023-01-08 DIAGNOSIS — M5442 Lumbago with sciatica, left side: Secondary | ICD-10-CM | POA: Diagnosis not present

## 2023-01-08 DIAGNOSIS — M4727 Other spondylosis with radiculopathy, lumbosacral region: Secondary | ICD-10-CM | POA: Diagnosis not present

## 2023-01-12 ENCOUNTER — Other Ambulatory Visit: Payer: Self-pay | Admitting: Radiation Oncology

## 2023-01-15 ENCOUNTER — Encounter: Payer: Self-pay | Admitting: Family Medicine

## 2023-01-15 ENCOUNTER — Ambulatory Visit (INDEPENDENT_AMBULATORY_CARE_PROVIDER_SITE_OTHER): Payer: Medicare Other | Admitting: Family Medicine

## 2023-01-15 VITALS — BP 110/65 | HR 70 | Temp 97.7°F | Ht 70.0 in | Wt 201.8 lb

## 2023-01-15 DIAGNOSIS — E118 Type 2 diabetes mellitus with unspecified complications: Secondary | ICD-10-CM

## 2023-01-15 NOTE — Assessment & Plan Note (Signed)
Tolerating his mounjaro well. Continue current regimen. Recheck 2 months. Call with any concerns.

## 2023-01-15 NOTE — Progress Notes (Signed)
BP 110/65   Pulse 70   Temp 97.7 F (36.5 C) (Oral)   Ht 5\' 10"  (1.778 m)   Wt 201 lb 12.8 oz (91.5 kg)   SpO2 98%   BMI 28.96 kg/m    Subjective:    Patient ID: Troy Christensen, male    DOB: 28-May-1951, 72 y.o.   MRN: [id number]  HPI: PAU BANH is a 72 y.o. male  Chief Complaint  Patient presents with   Diabetes    Patient says he has been checking his blood sugar readings have been ranging between 95-110.   DIABETES Hypoglycemic episodes:no Polydipsia/polyuria: yes Visual disturbance: no Chest pain: no Paresthesias: no Glucose Monitoring: no  Accucheck frequency: Not Checking  Fasting glucose: 95, 100, 110 Taking Insulin?: no Blood Pressure Monitoring: not checking Retinal Examination: Up to Date Foot Exam: Up to Date Diabetic Education: Completed Pneumovax: Up to Date Influenza: Up to Date Aspirin: yes  Not having issues with his urine. He notes that he is peeing less frequently   Relevant past medical, surgical, family and social history reviewed and updated as indicated. Interim medical history since our last visit reviewed. Allergies and medications reviewed and updated.  Review of Systems  Constitutional: Negative.   Respiratory: Negative.    Cardiovascular: Negative.   Gastrointestinal: Negative.   Musculoskeletal: Negative.   Neurological: Negative.   Psychiatric/Behavioral: Negative.      Per HPI unless specifically indicated above     Objective:    BP 110/65   Pulse 70   Temp 97.7 F (36.5 C) (Oral)   Ht 5\' 10"  (1.778 m)   Wt 201 lb 12.8 oz (91.5 kg)   SpO2 98%   BMI 28.96 kg/m   Wt Readings from Last 3 Encounters:  01/15/23 201 lb 12.8 oz (91.5 kg)  12/16/22 209 lb 14.4 oz (95.2 kg)  10/16/22 204 lb 3.2 oz (92.6 kg)    Physical Exam Vitals and nursing note reviewed.  Constitutional:      General: He is not in acute distress.    Appearance: Normal appearance. He is not ill-appearing, toxic-appearing or diaphoretic.   HENT:     Head: Normocephalic and atraumatic.     Right Ear: External ear normal.     Left Ear: External ear normal.     Nose: Nose normal.     Mouth/Throat:     Mouth: Mucous membranes are moist.     Pharynx: Oropharynx is clear.  Eyes:     General: No scleral icterus.       Right eye: No discharge.        Left eye: No discharge.     Extraocular Movements: Extraocular movements intact.     Conjunctiva/sclera: Conjunctivae normal.     Pupils: Pupils are equal, round, and reactive to light.  Cardiovascular:     Rate and Rhythm: Normal rate and regular rhythm.     Pulses: Normal pulses.     Heart sounds: Normal heart sounds. No murmur heard.    No friction rub. No gallop.  Pulmonary:     Effort: Pulmonary effort is normal. No respiratory distress.     Breath sounds: Normal breath sounds. No stridor. No wheezing, rhonchi or rales.  Chest:     Chest wall: No tenderness.  Musculoskeletal:        General: Normal range of motion.     Cervical back: Normal range of motion and neck supple.  Skin:    General: Skin is warm  and dry.     Capillary Refill: Capillary refill takes less than 2 seconds.     Coloration: Skin is not jaundiced or pale.     Findings: No bruising, erythema, lesion or rash.  Neurological:     General: No focal deficit present.     Mental Status: He is alert and oriented to person, place, and time. Mental status is at baseline.  Psychiatric:        Mood and Affect: Mood normal.        Behavior: Behavior normal.        Thought Content: Thought content normal.        Judgment: Judgment normal.     Results for orders placed or performed in visit on 12/16/22  Bayer DCA Hb A1c Waived  Result Value Ref Range   HB A1C (BAYER DCA - WAIVED) 8.3 (H) 4.8 - 5.6 %  Urinalysis, Routine w reflex microscopic  Result Value Ref Range   Specific Gravity, UA 1.020 1.005 - 1.030   pH, UA 5.0 5.0 - 7.5   Color, UA Yellow Yellow   Appearance Ur Clear Clear   Leukocytes,UA  Negative Negative   Protein,UA Trace (A) Negative/Trace   Glucose, UA Negative Negative   Ketones, UA Negative Negative   RBC, UA Negative Negative   Bilirubin, UA Negative Negative   Urobilinogen, Ur 0.2 0.2 - 1.0 mg/dL   Nitrite, UA Negative Negative   Microscopic Examination Comment   HM DIABETES EYE EXAM  Result Value Ref Range   HM Diabetic Eye Exam No Retinopathy No Retinopathy      Assessment & Plan:   Problem List Items Addressed This Visit       Endocrine   Controlled diabetes mellitus type 2 with complications (HCC) - Primary    Tolerating his mounjaro well. Continue current regimen. Recheck 2 months. Call with any concerns.         Follow up plan: Return in about 2 months (around 03/17/2023).

## 2023-01-15 NOTE — Telephone Encounter (Signed)
Not taking medicine. Does not need refill.

## 2023-01-20 DIAGNOSIS — H34832 Tributary (branch) retinal vein occlusion, left eye, with macular edema: Secondary | ICD-10-CM | POA: Diagnosis not present

## 2023-01-20 DIAGNOSIS — E113213 Type 2 diabetes mellitus with mild nonproliferative diabetic retinopathy with macular edema, bilateral: Secondary | ICD-10-CM | POA: Diagnosis not present

## 2023-01-20 DIAGNOSIS — H43813 Vitreous degeneration, bilateral: Secondary | ICD-10-CM | POA: Diagnosis not present

## 2023-01-20 DIAGNOSIS — H401132 Primary open-angle glaucoma, bilateral, moderate stage: Secondary | ICD-10-CM | POA: Diagnosis not present

## 2023-01-20 DIAGNOSIS — H35033 Hypertensive retinopathy, bilateral: Secondary | ICD-10-CM | POA: Diagnosis not present

## 2023-01-28 ENCOUNTER — Telehealth: Payer: Self-pay | Admitting: Urology

## 2023-01-28 NOTE — Telephone Encounter (Signed)
Patient states the last 2 to 3 days has noticed that during the day he has decreased sensation with knowing when to urinate and has been having accidents, during the night he does not have any problems and urinates ok. Patient is scheduled to come in tomorrow 01/29/23

## 2023-01-28 NOTE — Telephone Encounter (Signed)
Pt is having problems and wants to speak with a CMA to discuss whether meds may need changing or if he needs to be seen.

## 2023-01-29 ENCOUNTER — Ambulatory Visit (INDEPENDENT_AMBULATORY_CARE_PROVIDER_SITE_OTHER): Payer: Medicare Other | Admitting: Urology

## 2023-01-29 VITALS — BP 124/75 | HR 83 | Ht 70.0 in | Wt 199.4 lb

## 2023-01-29 DIAGNOSIS — N3941 Urge incontinence: Secondary | ICD-10-CM

## 2023-01-29 DIAGNOSIS — N3281 Overactive bladder: Secondary | ICD-10-CM

## 2023-01-29 DIAGNOSIS — R350 Frequency of micturition: Secondary | ICD-10-CM | POA: Diagnosis not present

## 2023-01-29 DIAGNOSIS — R3915 Urgency of urination: Secondary | ICD-10-CM

## 2023-01-29 LAB — MICROSCOPIC EXAMINATION

## 2023-01-29 LAB — URINALYSIS, COMPLETE
Bilirubin, UA: NEGATIVE
Glucose, UA: NEGATIVE
Ketones, UA: NEGATIVE
Leukocytes,UA: NEGATIVE
Nitrite, UA: NEGATIVE
RBC, UA: NEGATIVE
Specific Gravity, UA: >1.03 — ABNORMAL HIGH (ref 1.005–1.030)
Urobilinogen, Ur: 0.2 mg/dL (ref 0.2–1.0)
pH, UA: 5.5 (ref 5.0–7.5)

## 2023-01-29 LAB — BLADDER SCAN AMB NON-IMAGING: Scan Result: 106

## 2023-01-29 MED ORDER — TOLTERODINE TARTRATE ER 4 MG PO CP24: 4.0000 mg | ORAL_CAPSULE | Freq: Every day | ORAL | 11 refills | Status: DC

## 2023-01-29 NOTE — Progress Notes (Signed)
I,Amy L Pierron,acting as a scribe for Vanna Scotland, MD.,have documented all relevant documentation on the behalf of Vanna Scotland, MD,as directed by  Vanna Scotland, MD while in the presence of Vanna Scotland, MD.  01/29/2023 11:24 AM   Valentina Gu 06-Sep-1951 [id number]  Referring provider: Dorcas Carrow, DO [address] [place],  [place] [address]  Chief Complaint  Patient presents with   Urinary Retention    HPI: 72 year-old male with a personal history of prostate cancer and OAB urge incontinence presents today for a follow-up.  He is status post IMRT with Salvage brachytherapy in 2022.   He was last seen in January of 2024 with urgency, frequency, and urge incontinence symptoms. He tried and failed Oxybutynin. He was given samples of Gemtasa for six weeks and advised to continue Flomax. He was also on Farxiga which likely was exacerbating his symptoms. He did think Cena Benton was helping, but not cost effective so he was changed to Trospium in February. He called our office yesterday concerned about having decreased sensation of knowing when he needed to urinate, having accidents at nighttime, but not having issues in the daytime.   His urinalysis today is negative.  He reports nocturia x2. He has leakage during the day and isn't aware it is happening until he feels it in his pants. He still doesn't know when he needs to urinate. He is now on [facility] for his blood sugar levels and wonders if it is a side effect.   Results for orders placed or performed in visit on 01/29/23  Bladder Scan (Post Void Residual) in office  Result Value Ref Range   Scan Result 106 ml     PMH: Past Medical History:  Diagnosis Date   Actinic keratosis    Cancer (HCC)    Prostate   Diabetes mellitus without complication (HCC)    Glaucoma    Gout    Hypercholesteremia    Hypertension    Osteoarthritis    hands   Prostate cancer [facility])     Surgical History: Past Surgical History:   Procedure Laterality Date   EYE SURGERY Bilateral    march and april 2021- [place]    FLEXIBLE SIGMOIDOSCOPY N/A 01/10/2016   Procedure: FLEXIBLE SIGMOIDOSCOPY with  argon plasma coagulation;  Surgeon: Midge Minium, MD;  [facility];  Service: Endoscopy;  Laterality: N/A;  Diabetic - oral meds   HERNIA REPAIR     JOINT REPLACEMENT  2014   Right Knee Replacement   MENISCUS REPAIR Left    nephrolithiasis     pt denies   PROSTATE SURGERY     Radiation treatments - no surgery   RADIOACTIVE SEED IMPLANT N/A 03/25/2021   Procedure: RADIOACTIVE SEED IMPLANT/BRACHYTHERAPY IMPLANT;  Surgeon: Vanna Scotland, MD;  Location: [facility];  Service: Urology;  Laterality: N/A;   RETINAL DETACHMENT SURGERY Bilateral    One surgery in March 2021, and Second in April 2021   ROTATOR CUFF REPAIR Right 2012    Home Medications:  Allergies as of 01/29/2023       Reactions   Farxiga [dapagliflozin] Other (See Comments)   Urinary incontinence   Gabapentin Other (See Comments)   Urinary incontinence   Naproxen Other (See Comments)   Fatigue, patient states it slows him down         Medication List        Accurate as of Jan 29, 2023 11:24 AM. If you have any questions, ask your nurse  or doctor.          STOP taking these medications    sildenafil 20 MG tablet Commonly known as: REVATIO   trospium 20 MG tablet Commonly known as: SANCTURA       TAKE these medications    allopurinol 100 MG tablet Commonly known as: ZYLOPRIM Take 1 tablet (100 mg total) by mouth daily.   amLODipine-benazepril 5-20 MG capsule Commonly known as: LOTREL TAKE 2 CAPSULES ONCE EACH DAY   aspirin EC 81 MG tablet Take 81 mg by mouth 3 (three) times a week. Pt states 2-3 times weekly   FISH OIL PO Take 1 capsule by mouth daily.   glucose blood test strip 1 each by Other route as needed for other. Use contour next test strip to check sugar 2 times daily   lovastatin 40 MG  tablet Commonly known as: MEVACOR TAKE 1 TABLET DAILY   meloxicam 15 MG tablet Commonly known as: MOBIC Take 1 tablet (15 mg total) by mouth daily.   metFORMIN 500 MG 24 hr tablet Commonly known as: GLUCOPHAGE-XR Take 2 tablets (1,000 mg total) by mouth 2 (two) times daily.   pioglitazone 45 MG tablet Commonly known as: ACTOS Take 1 tablet (45 mg total) by mouth daily.   tamsulosin 0.4 MG Caps capsule Commonly known as: FLOMAX TAKE 1 CAPSULE BY MOUTH IN THE MORNING AND AT BEDTIME.   timolol 0.5 % ophthalmic solution Commonly known as: TIMOPTIC Place 1 drop into both eyes 2 (two) times daily.   tirzepatide 10 MG/0.5ML Pen Commonly known as: MOUNJARO Inject 10 mg into the skin once a week.   tolterodine 4 MG 24 hr capsule Commonly known as: Detrol LA Take 1 capsule (4 mg total) by mouth daily.        Allergies:  Allergies  Allergen Reactions   Farxiga [Dapagliflozin] Other (See Comments)    Urinary incontinence   Gabapentin Other (See Comments)    Urinary incontinence   Naproxen Other (See Comments)    Fatigue, patient states it slows him down     Family History: Family History  Problem Relation Age of Onset   Cancer Father 69       lung   Emphysema Mother    Dementia Mother    Cancer Brother        melanoma    Social History:  reports that he quit smoking about 47 years ago. His smoking use included cigarettes. He has been exposed to tobacco smoke. He has never used smokeless tobacco. He reports that he does not drink alcohol and does not use drugs.   Physical Exam: BP 124/75   Pulse 83   Ht 5\' 10"  (1.778 m)   Wt 199 lb 6 oz (90.4 kg)   BMI 28.61 kg/m   Constitutional:  Alert and oriented, No acute distress. HEENT: Jay AT, moist mucus membranes.  Trachea midline, no masses. Neurologic: Grossly intact, no focal deficits, moving all 4 extremities. Psychiatric: Normal mood and affect.  Assessment & Plan:    Urgency urge incontinence  - No longer  responding to the Trospium.  -He's emptying his bladder reasonable well.  -His analysis UA negative today. No concern for additional underlying pathology.  -Changed his medication to Detrol LA, 4 mg disp 30, since it is covered by his insurance.  -After that, will have to consider second line therapies.  Return if symptoms worsen or fail to improve.  I have reviewed the above documentation for accuracy and completeness,  and I agree with the above.   Vanna Scotland, MD    [facility] [address] [place], [place] [address] [phone]

## 2023-01-30 ENCOUNTER — Telehealth: Payer: Self-pay

## 2023-01-30 NOTE — Telephone Encounter (Signed)
Received fax requesting refill on Pregabalin 75 MG cap and it is not active on patient's current medication list. Please advise?

## 2023-01-30 NOTE — Telephone Encounter (Signed)
Patient is not taking that medication.

## 2023-02-03 ENCOUNTER — Other Ambulatory Visit: Payer: Self-pay | Admitting: Urology

## 2023-02-03 DIAGNOSIS — M5416 Radiculopathy, lumbar region: Secondary | ICD-10-CM | POA: Diagnosis not present

## 2023-02-24 ENCOUNTER — Telehealth: Payer: Self-pay

## 2023-02-24 NOTE — Telephone Encounter (Signed)
Noted  

## 2023-02-24 NOTE — Telephone Encounter (Signed)
He is not taking this medicine and has not been for quite some time.

## 2023-02-24 NOTE — Telephone Encounter (Signed)
Received a fax from [facility] requesting a refill on patient's Pregabalin 75 MG capsule. Medication is not listed in patient's chart. Please advise?

## 2023-02-25 ENCOUNTER — Telehealth: Payer: Self-pay

## 2023-02-25 NOTE — Telephone Encounter (Signed)
This is in regards to new medication Detrol 4 mg

## 2023-02-25 NOTE — Telephone Encounter (Signed)
Pt wanted AJB to know the med she rxed is working well.

## 2023-02-25 NOTE — Telephone Encounter (Signed)
Refills are at the pharmacy, patient advised.

## 2023-02-25 NOTE — Telephone Encounter (Signed)
Ok be sure he has refills and f/u as schedule.  Great news.  Vanna Scotland, MD

## 2023-03-01 ENCOUNTER — Other Ambulatory Visit: Payer: Self-pay | Admitting: Family Medicine

## 2023-03-01 DIAGNOSIS — T560X1D Toxic effect of lead and its compounds, accidental (unintentional), subsequent encounter: Secondary | ICD-10-CM

## 2023-03-01 DIAGNOSIS — I10 Essential (primary) hypertension: Secondary | ICD-10-CM

## 2023-03-02 NOTE — Telephone Encounter (Signed)
Requested Prescriptions  Pending Prescriptions Disp Refills   allopurinol (ZYLOPRIM) 100 MG tablet [Pharmacy Med Name: ALLOPURINOL TABS 100MG ] 90 tablet 1    Sig: TAKE 1 TABLET DAILY     Endocrinology:  Gout Agents - allopurinol Passed - 03/01/2023  5:23 AM      Passed - Uric Acid in normal range and within 360 days    Uric Acid  Date Value Ref Range Status  09/04/2022 4.7 3.8 - 8.4 mg/dL Final    Comment:               Therapeutic target for gout patients: <6.0         Passed - Cr in normal range and within 360 days    Creatinine, Ser  Date Value Ref Range Status  09/22/2022 1.11 0.61 - 1.24 mg/dL Final         Passed - Valid encounter within last 12 months    Recent Outpatient Visits           1 month ago Controlled type 2 diabetes mellitus with complication, without long-term current use of insulin (HCC)   [place] [facility] [place], [name] P, DO   2 months ago Controlled type 2 diabetes mellitus with complication, without long-term current use of insulin (HCC)   [facility] [place], [name] P, DO   4 months ago Urinary incontinence, unspecified type   [place] [facility] [place], [name] P, DO   5 months ago Gastroenteritis   [place] [facility] [place], [place], DO   5 months ago Lumbar radiculopathy   [place] [facility] [place], [place], DO       Future Appointments             In 1 week Dorcas Carrow, DO [place] [facility]   In 7 months Vanna Scotland, MD [facility] [place]   In 8 months Deirdre Evener, MD [place] [facility]            Passed - CBC within normal limits and completed in the last 12 months    WBC  Date Value Ref Range Status  09/22/2022 3.6 (L) 4.0 - 10.5 K/uL Final   RBC  Date Value Ref Range Status  09/22/2022 4.59 4.22 - 5.81 MIL/uL Final   Hemoglobin  Date Value Ref Range Status   09/22/2022 15.0 13.0 - 17.0 g/dL Final  16/06/9603 54.0 (L) 13.0 - 17.7 g/dL Final   HCT  Date Value Ref Range Status  09/22/2022 43.6 39.0 - 52.0 % Final   Hematocrit  Date Value Ref Range Status  09/04/2022 38.1 37.5 - 51.0 % Final   MCHC  Date Value Ref Range Status  09/22/2022 34.4 30.0 - 36.0 g/dL Final   [facility]  Date Value Ref Range Status  09/22/2022 32.7 26.0 - 34.0 pg Final   MCV  Date Value Ref Range Status  09/22/2022 95.0 80.0 - 100.0 fL Final  09/04/2022 98 (H) 79 - 97 fL Final   No results found for: "PLTCOUNTKUC", "LABPLAT", "POCPLA" RDW  Date Value Ref Range Status  09/22/2022 14.0 11.5 - 15.5 % Final  09/04/2022 13.2 11.6 - 15.4 % Final          amLODipine-benazepril (LOTREL) 5-20 MG capsule [Pharmacy Med Name: AMLODIPINE/BENAZEPRIL CAPS 5/20MG ] 180 capsule 1    Sig: TAKE 2 CAPSULES ONCE DAILY     Cardiovascular: CCB + ACEI  Combos Failed - 03/01/2023  5:23 AM      Failed - Na in normal range and within 180 days    Sodium  Date Value Ref Range Status  09/22/2022 134 (L) 135 - 145 mmol/L Final  09/04/2022 138 134 - 144 mmol/L Final         Passed - Cr in normal range and within 180 days    Creatinine, Ser  Date Value Ref Range Status  09/22/2022 1.11 0.61 - 1.24 mg/dL Final         Passed - K in normal range and within 180 days    Potassium  Date Value Ref Range Status  09/22/2022 4.6 3.5 - 5.1 mmol/L Final         Passed - eGFR is 30 or above and within 180 days    GFR calc Af Amer  Date Value Ref Range Status  07/31/2020 80 >59 mL/min/1.73 Final    Comment:    **In accordance with recommendations from the NKF-ASN Task force,**   [facility] is in the process of updating its eGFR calculation to the   2021 CKD-EPI creatinine equation that estimates kidney function   without a race variable.    GFR, Estimated  Date Value Ref Range Status  09/22/2022 >60 >60 mL/min Final    Comment:    (NOTE) Calculated using the CKD-EPI Creatinine  Equation (2021)    eGFR  Date Value Ref Range Status  09/04/2022 73 >59 mL/min/1.73 Final         Passed - Patient is not pregnant      Passed - Last BP in normal range    BP Readings from Last 1 Encounters:  01/29/23 124/75         Passed - Valid encounter within last 6 months    Recent Outpatient Visits           1 month ago Controlled type 2 diabetes mellitus with complication, without long-term current use of insulin (HCC)   [place] [facility] [place], [name] P, DO   2 months ago Controlled type 2 diabetes mellitus with complication, without long-term current use of insulin (HCC)   [facility] [place], [name] P, DO   4 months ago Urinary incontinence, unspecified type   [place] [facility] [place], [place], DO   5 months ago Gastroenteritis   [place] [facility] [place], [place], DO   5 months ago Lumbar radiculopathy   [place] [facility] [place], Oralia [place], DO       Future Appointments             In 1 week Dorcas Carrow, DO [place] [facility]   In 7 months Vanna Scotland, MD [facility] [place]   In 8 months Deirdre Evener, MD [facility]

## 2023-03-06 ENCOUNTER — Other Ambulatory Visit: Payer: Self-pay | Admitting: Family Medicine

## 2023-03-09 NOTE — Telephone Encounter (Signed)
Unable to refill per protocol, Rx expired. Discontinued 12/16/22.  Requested Prescriptions  Pending Prescriptions Disp Refills   pregabalin (LYRICA) 75 MG capsule [Pharmacy Med Name: PREGABALIN 75 MG CAP] 60 capsule     Sig: TAKE 1 CAPSULE BY MOUTH TWICE DAILY     Not Delegated - Neurology:  Anticonvulsants - Controlled - pregabalin Failed - 03/06/2023  5:32 PM      Failed - This refill cannot be delegated      Passed - Cr in normal range and within 360 days    Creatinine, Ser  Date Value Ref Range Status  09/22/2022 1.11 0.61 - 1.24 mg/dL Final         Passed - Completed PHQ-2 or PHQ-9 in the last 360 days      Passed - Valid encounter within last 12 months    Recent Outpatient Visits           1 month ago Controlled type 2 diabetes mellitus with complication, without long-term current use of insulin (HCC)   [place] [facility] [place], [name] P, DO   2 months ago Controlled type 2 diabetes mellitus with complication, without long-term current use of insulin (HCC)   [place] [facility] [place], [name] P, DO   4 months ago Urinary incontinence, unspecified type   [place] [facility] [place], [place], DO   5 months ago Gastroenteritis   [place] [facility] [place], [place], DO   6 months ago Lumbar radiculopathy   [place] [facility] Dorcas Carrow, DO       Future Appointments             In 3 days Dorcas Carrow, DO [place] [facility]   In 6 months Vanna Scotland, MD [place] [place]   In 7 months Deirdre Evener, MD [facility]

## 2023-03-10 DIAGNOSIS — E113291 Type 2 diabetes mellitus with mild nonproliferative diabetic retinopathy without macular edema, right eye: Secondary | ICD-10-CM | POA: Diagnosis not present

## 2023-03-10 DIAGNOSIS — H43813 Vitreous degeneration, bilateral: Secondary | ICD-10-CM | POA: Diagnosis not present

## 2023-03-10 DIAGNOSIS — H401132 Primary open-angle glaucoma, bilateral, moderate stage: Secondary | ICD-10-CM | POA: Diagnosis not present

## 2023-03-10 DIAGNOSIS — H35033 Hypertensive retinopathy, bilateral: Secondary | ICD-10-CM | POA: Diagnosis not present

## 2023-03-10 DIAGNOSIS — H34832 Tributary (branch) retinal vein occlusion, left eye, with macular edema: Secondary | ICD-10-CM | POA: Diagnosis not present

## 2023-03-10 DIAGNOSIS — E113212 Type 2 diabetes mellitus with mild nonproliferative diabetic retinopathy with macular edema, left eye: Secondary | ICD-10-CM | POA: Diagnosis not present

## 2023-03-12 ENCOUNTER — Ambulatory Visit (INDEPENDENT_AMBULATORY_CARE_PROVIDER_SITE_OTHER): Payer: Medicare Other | Admitting: Family Medicine

## 2023-03-12 ENCOUNTER — Other Ambulatory Visit: Payer: Self-pay

## 2023-03-12 ENCOUNTER — Encounter: Payer: Self-pay | Admitting: Family Medicine

## 2023-03-12 VITALS — BP 126/74 | HR 66 | Temp 97.9°F | Wt 192.0 lb

## 2023-03-12 DIAGNOSIS — E118 Type 2 diabetes mellitus with unspecified complications: Secondary | ICD-10-CM | POA: Diagnosis not present

## 2023-03-12 DIAGNOSIS — I10 Essential (primary) hypertension: Secondary | ICD-10-CM | POA: Diagnosis not present

## 2023-03-12 DIAGNOSIS — Z7985 Long-term (current) use of injectable non-insulin antidiabetic drugs: Secondary | ICD-10-CM

## 2023-03-12 DIAGNOSIS — E785 Hyperlipidemia, unspecified: Secondary | ICD-10-CM | POA: Diagnosis not present

## 2023-03-12 DIAGNOSIS — E7800 Pure hypercholesterolemia, unspecified: Secondary | ICD-10-CM | POA: Diagnosis not present

## 2023-03-12 LAB — BAYER DCA HB A1C WAIVED: HB A1C (BAYER DCA - WAIVED): 6.6 % — ABNORMAL HIGH (ref 4.8–5.6)

## 2023-03-12 MED ORDER — PIOGLITAZONE HCL 45 MG PO TABS: 45.0000 mg | ORAL_TABLET | Freq: Every day | ORAL | 1 refills | Status: DC

## 2023-03-12 MED ORDER — LOVASTATIN 40 MG PO TABS: 40.0000 mg | ORAL_TABLET | Freq: Every day | ORAL | 1 refills | Status: DC

## 2023-03-12 MED ORDER — METFORMIN HCL ER 500 MG PO TB24: 1000.0000 mg | ORAL_TABLET | Freq: Two times a day (BID) | ORAL | 1 refills | Status: DC

## 2023-03-12 MED ORDER — TIRZEPATIDE 10 MG/0.5ML ~~LOC~~ SOAJ
10.0000 mg | SUBCUTANEOUS | 3 refills | Status: DC
  Filled 2023-03-12: qty 6, 84d supply, fill #0
  Filled 2023-03-13: qty 2, 28d supply, fill #0

## 2023-03-12 NOTE — Assessment & Plan Note (Signed)
Under good control on current regimen. Continue current regimen. Continue to monitor. Call with any concerns. Refills given. Labs drawn today.   

## 2023-03-12 NOTE — Assessment & Plan Note (Signed)
Improved with A1c of 6.6 down from 8.3. Continue current regimen. Continue to monitor. Call with any concerns.

## 2023-03-12 NOTE — Progress Notes (Signed)
BP 126/74   Pulse 66   Temp 97.9 F (36.6 C) (Oral)   Wt 192 lb (87.1 kg)   SpO2 98%   BMI 27.55 kg/m    Subjective:    Patient ID: Troy Christensen, male    DOB: May 05, 1951, 72 y.o.   MRN: [id number]  HPI: Troy BALEY is a 72 y.o. male  Chief Complaint  Patient presents with   Diabetes    Pt states everything is going good. Needs a new medication because having a hard time getting mounjoro   DIABETES Hypoglycemic episodes:no Polydipsia/polyuria: no Visual disturbance: no Chest pain: no Paresthesias: no Glucose Monitoring: yes Taking Insulin?: no Blood Pressure Monitoring: not checking Retinal Examination: Up to Date Foot Exam: Up to Date Diabetic Education: Completed Pneumovax: Up to Date Influenza: Up to Date Aspirin: yes  HYPERTENSION / HYPERLIPIDEMIA Satisfied with current treatment? yes Duration of hypertension: chronic BP monitoring frequency: not checking BP medication side effects: no Past BP meds: amlodipine, benazepril Duration of hyperlipidemia: chronic Cholesterol medication side effects: no Cholesterol supplements: fish oil Past cholesterol medications: lovastatin Medication compliance: excellent compliance Aspirin: yes Recent stressors: no Recurrent headaches: no Visual changes: no Palpitations: no Dyspnea: no Chest pain: no Lower extremity edema: no Dizzy/lightheaded: no  Relevant past medical, surgical, family and social history reviewed and updated as indicated. Interim medical history since our last visit reviewed. Allergies and medications reviewed and updated.  Review of Systems  Constitutional: Negative.   Respiratory: Negative.    Cardiovascular: Negative.   Gastrointestinal: Negative.   Musculoskeletal: Negative.   Neurological: Negative.   Psychiatric/Behavioral: Negative.      Per HPI unless specifically indicated above     Objective:    BP 126/74   Pulse 66   Temp 97.9 F (36.6 C) (Oral)   Wt 192 lb  (87.1 kg)   SpO2 98%   BMI 27.55 kg/m   Wt Readings from Last 3 Encounters:  03/12/23 192 lb (87.1 kg)  01/29/23 199 lb 6 oz (90.4 kg)  01/15/23 201 lb 12.8 oz (91.5 kg)    Physical Exam Vitals and nursing note reviewed.  Constitutional:      General: He is not in acute distress.    Appearance: Normal appearance. He is not ill-appearing, toxic-appearing or diaphoretic.  HENT:     Head: Normocephalic and atraumatic.     Right Ear: External ear normal.     Left Ear: External ear normal.     Nose: Nose normal.     Mouth/Throat:     Mouth: Mucous membranes are moist.     Pharynx: Oropharynx is clear.  Eyes:     General: No scleral icterus.       Right eye: No discharge.        Left eye: No discharge.     Extraocular Movements: Extraocular movements intact.     Conjunctiva/sclera: Conjunctivae normal.     Pupils: Pupils are equal, round, and reactive to light.  Cardiovascular:     Rate and Rhythm: Normal rate and regular rhythm.     Pulses: Normal pulses.     Heart sounds: Normal heart sounds. No murmur heard.    No friction rub. No gallop.  Pulmonary:     Effort: Pulmonary effort is normal. No respiratory distress.     Breath sounds: Normal breath sounds. No stridor. No wheezing, rhonchi or rales.  Chest:     Chest wall: No tenderness.  Musculoskeletal:  General: Normal range of motion.     Cervical back: Normal range of motion and neck supple.  Skin:    General: Skin is warm and dry.     Capillary Refill: Capillary refill takes less than 2 seconds.     Coloration: Skin is not jaundiced or pale.     Findings: No bruising, erythema, lesion or rash.  Neurological:     General: No focal deficit present.     Mental Status: He is alert and oriented to person, place, and time. Mental status is at baseline.  Psychiatric:        Mood and Affect: Mood normal.        Behavior: Behavior normal.        Thought Content: Thought content normal.        Judgment: Judgment  normal.     Results for orders placed or performed in visit on 01/29/23  Microscopic Examination   Urine  Result Value Ref Range   WBC, UA 0-5 0 - 5 /hpf   RBC, Urine 0-2 0 - 2 /hpf   Epithelial Cells (non renal) 0-10 0 - 10 /hpf   Bacteria, UA Few None seen/Few  Urinalysis, Complete  Result Value Ref Range   Specific Gravity, UA >1.030 (H) 1.005 - 1.030   pH, UA 5.5 5.0 - 7.5   Color, UA Yellow Yellow   Appearance Ur Clear Clear   Leukocytes,UA Negative Negative   Protein,UA 1+ (A) Negative/Trace   Glucose, UA Negative Negative   Ketones, UA Negative Negative   RBC, UA Negative Negative   Bilirubin, UA Negative Negative   Urobilinogen, Ur 0.2 0.2 - 1.0 mg/dL   Nitrite, UA Negative Negative   Microscopic Examination See below:   Bladder Scan (Post Void Residual) in office  Result Value Ref Range   Scan Result 106 ml       Assessment & Plan:   Problem List Items Addressed This Visit       Cardiovascular and Mediastinum   Hypertension    Under good control on current regimen. Continue current regimen. Continue to monitor. Call with any concerns. Refills given. Labs drawn today.        Relevant Medications   lovastatin (MEVACOR) 40 MG tablet     Endocrine   Controlled diabetes mellitus type 2 with complications (HCC) - Primary    Improved with A1c of 6.6 down from 8.3. Continue current regimen. Continue to monitor. Call with any concerns.       Relevant Medications   lovastatin (MEVACOR) 40 MG tablet   metFORMIN (GLUCOPHAGE-XR) 500 MG 24 hr tablet   pioglitazone (ACTOS) 45 MG tablet   tirzepatide (MOUNJARO) 10 MG/0.5ML Pen   Other Relevant Orders   Bayer DCA Hb A1c Waived   CBC with Differential/Platelet   Lipid Panel w/o Chol/HDL Ratio   Comprehensive metabolic panel     Other   Hyperlipidemia    Under good control on current regimen. Continue current regimen. Continue to monitor. Call with any concerns. Refills given. Labs drawn today.       Relevant  Medications   lovastatin (MEVACOR) 40 MG tablet     Follow up plan: Return in about 3 months (around 06/12/2023).

## 2023-03-13 ENCOUNTER — Encounter: Payer: Self-pay | Admitting: Internal Medicine

## 2023-03-13 ENCOUNTER — Other Ambulatory Visit: Payer: Self-pay

## 2023-03-13 LAB — CBC WITH DIFFERENTIAL/PLATELET
Basophils Absolute: 0 10*3/uL (ref 0.0–0.2)
Basos: 1 %
EOS (ABSOLUTE): 0.1 10*3/uL (ref 0.0–0.4)
Eos: 4 %
Hematocrit: 35.8 % — ABNORMAL LOW (ref 37.5–51.0)
Hemoglobin: 12.2 g/dL — ABNORMAL LOW (ref 13.0–17.7)
Immature Grans (Abs): 0 10*3/uL (ref 0.0–0.1)
Immature Granulocytes: 0 %
Lymphocytes Absolute: 1.1 10*3/uL (ref 0.7–3.1)
Lymphs: 38 %
MCH: 33.5 pg — ABNORMAL HIGH (ref 26.6–33.0)
MCHC: 34.1 g/dL (ref 31.5–35.7)
MCV: 98 fL — ABNORMAL HIGH (ref 79–97)
Monocytes Absolute: 0.3 10*3/uL (ref 0.1–0.9)
Monocytes: 9 %
Neutrophils Absolute: 1.4 10*3/uL (ref 1.4–7.0)
Neutrophils: 48 %
Platelets: 165 10*3/uL (ref 150–450)
RBC: 3.64 x10E6/uL — ABNORMAL LOW (ref 4.14–5.80)
RDW: 14.1 % (ref 11.6–15.4)
WBC: 3 10*3/uL — ABNORMAL LOW (ref 3.4–10.8)

## 2023-03-13 LAB — COMPREHENSIVE METABOLIC PANEL
ALT: 15 IU/L (ref 0–44)
AST: 19 IU/L (ref 0–40)
Albumin: 4.6 g/dL (ref 3.8–4.8)
Alkaline Phosphatase: 51 IU/L (ref 44–121)
BUN/Creatinine Ratio: 14 (ref 10–24)
BUN: 16 mg/dL (ref 8–27)
Bilirubin Total: 0.3 mg/dL (ref 0.0–1.2)
CO2: 21 mmol/L (ref 20–29)
Calcium: 9.8 mg/dL (ref 8.6–10.2)
Chloride: 105 mmol/L (ref 96–106)
Creatinine, Ser: 1.12 mg/dL (ref 0.76–1.27)
Globulin, Total: 2 g/dL (ref 1.5–4.5)
Glucose: 110 mg/dL — ABNORMAL HIGH (ref 70–99)
Potassium: 4.6 mmol/L (ref 3.5–5.2)
Sodium: 141 mmol/L (ref 134–144)
Total Protein: 6.6 g/dL (ref 6.0–8.5)
eGFR: 70 mL/min/{1.73_m2} (ref >59)

## 2023-03-13 LAB — LIPID PANEL W/O CHOL/HDL RATIO
Cholesterol, Total: 141 mg/dL (ref 100–199)
HDL: 53 mg/dL (ref >39)
LDL Chol Calc (NIH): 61 mg/dL (ref 0–99)
Triglycerides: 158 mg/dL — ABNORMAL HIGH (ref 0–149)
VLDL Cholesterol Cal: 27 mg/dL (ref 5–40)

## 2023-03-15 ENCOUNTER — Other Ambulatory Visit: Payer: Self-pay | Admitting: Family Medicine

## 2023-03-15 DIAGNOSIS — D649 Anemia, unspecified: Secondary | ICD-10-CM

## 2023-03-17 ENCOUNTER — Other Ambulatory Visit: Payer: Medicare Other

## 2023-03-17 ENCOUNTER — Telehealth: Payer: Self-pay

## 2023-03-17 DIAGNOSIS — D649 Anemia, unspecified: Secondary | ICD-10-CM | POA: Diagnosis not present

## 2023-03-17 NOTE — Telephone Encounter (Signed)
ERROR

## 2023-03-18 LAB — IRON AND TIBC
Iron Saturation: 27 % (ref 15–55)
Iron: 96 ug/dL (ref 38–169)
Total Iron Binding Capacity: 356 ug/dL (ref 250–450)
UIBC: 260 ug/dL (ref 111–343)

## 2023-03-18 LAB — FERRITIN: Ferritin: 67 ng/mL (ref 30–400)

## 2023-03-18 LAB — VITAMIN B12: Vitamin B-12: 123 pg/mL — ABNORMAL LOW (ref 232–1245)

## 2023-03-20 ENCOUNTER — Other Ambulatory Visit: Payer: Self-pay | Admitting: Family Medicine

## 2023-03-20 DIAGNOSIS — E538 Deficiency of other specified B group vitamins: Secondary | ICD-10-CM | POA: Insufficient documentation

## 2023-03-20 LAB — FECAL OCCULT BLOOD, IMMUNOCHEMICAL: Fecal Occult Bld: NEGATIVE

## 2023-03-20 MED ORDER — CYANOCOBALAMIN 1000 MCG/ML IJ SOLN
1000.00 ug | INTRAMUSCULAR | Status: AC
Start: 2023-03-20 — End: 2023-04-17
  Administered 2023-03-24: 1000 ug via INTRAMUSCULAR

## 2023-03-20 MED ORDER — CYANOCOBALAMIN 1000 MCG/ML IJ SOLN
1000.00 ug | INTRAMUSCULAR | Status: AC
Start: 2023-04-20 — End: 2024-04-13
  Administered 2023-04-28 – 2024-03-24 (×8): 1000 ug via INTRAMUSCULAR

## 2023-03-24 ENCOUNTER — Ambulatory Visit (INDEPENDENT_AMBULATORY_CARE_PROVIDER_SITE_OTHER): Payer: Medicare Other

## 2023-03-24 DIAGNOSIS — E538 Deficiency of other specified B group vitamins: Secondary | ICD-10-CM

## 2023-03-31 ENCOUNTER — Other Ambulatory Visit: Payer: Self-pay | Admitting: Family Medicine

## 2023-04-01 NOTE — Telephone Encounter (Signed)
Requested medication (s) are due for refill today -no  Requested medication (s) are on the active medication list -no  Future visit scheduled -yes  Last refill: no longer on current medication list  Notes to clinic: non delegated Rx  Requested Prescriptions  Pending Prescriptions Disp Refills   pregabalin (LYRICA) 75 MG capsule [Pharmacy Med Name: PREGABALIN 75 MG CAP] 60 capsule     Sig: TAKE 1 CAPSULE BY MOUTH TWICE DAILY     Not Delegated - Neurology:  Anticonvulsants - Controlled - pregabalin Failed - 03/31/2023  4:31 PM      Failed - This refill cannot be delegated      Passed - Cr in normal range and within 360 days    Creatinine, Ser  Date Value Ref Range Status  03/12/2023 1.12 0.76 - 1.27 mg/dL Final         Passed - Completed PHQ-2 or PHQ-9 in the last 360 days      Passed - Valid encounter within last 12 months    Recent Outpatient Visits           2 weeks ago Controlled type 2 diabetes mellitus with complication, without long-term current use of insulin (HCC)   [place] [facility] [place], [name] P, DO   2 months ago Controlled type 2 diabetes mellitus with complication, without long-term current use of insulin (HCC)   [place] [facility] [place], [name] P, DO   3 months ago Controlled type 2 diabetes mellitus with complication, without long-term current use of insulin (HCC)   [facility] [place], [name] P, DO   5 months ago Urinary incontinence, unspecified type   [facility] [place], [name] P, DO   6 months ago Gastroenteritis   [place] [facility] [place], [place], DO       Future Appointments             In 2 months Laural Benes, Oralia Rud, DO [place] [facility], [facility]   In 6 months Vanna Scotland, MD [facility] [place]   In 7 months Deirdre Evener, MD [place] [facility]               Requested Prescriptions   Pending Prescriptions Disp Refills   pregabalin (LYRICA) 75 MG capsule [Pharmacy Med Name: PREGABALIN 75 MG CAP] 60 capsule     Sig: TAKE 1 CAPSULE BY MOUTH TWICE DAILY     Not Delegated - Neurology:  Anticonvulsants - Controlled - pregabalin Failed - 03/31/2023  4:31 PM      Failed - This refill cannot be delegated      Passed - Cr in normal range and within 360 days    Creatinine, Ser  Date Value Ref Range Status  03/12/2023 1.12 0.76 - 1.27 mg/dL Final         Passed - Completed PHQ-2 or PHQ-9 in the last 360 days      Passed - Valid encounter within last 12 months    Recent Outpatient Visits           2 weeks ago Controlled type 2 diabetes mellitus with complication, without long-term current use of insulin (HCC)   [facility] [place], [name] P, DO   2 months ago Controlled type 2 diabetes mellitus with complication, without long-term current use of insulin (HCC)    [facility] [place], [name] P, DO   3  months ago Controlled type 2 diabetes mellitus with complication, without long-term current use of insulin (HCC)   [place] [facility] [place], [name] P, DO   5 months ago Urinary incontinence, unspecified type   [place] [facility] [place], [place], DO   6 months ago Gastroenteritis   [place] [facility] [place], Oralia Rud, DO       Future Appointments             In 2 months Laural Benes, Oralia Rud, DO  [facility] [place]   In 6 months Vanna Scotland, MD [facility] [place]   In 7 months Deirdre Evener, MD [facility] [place] [facility]

## 2023-04-28 ENCOUNTER — Ambulatory Visit (INDEPENDENT_AMBULATORY_CARE_PROVIDER_SITE_OTHER): Payer: Medicare Other

## 2023-04-28 DIAGNOSIS — E538 Deficiency of other specified B group vitamins: Secondary | ICD-10-CM | POA: Diagnosis not present

## 2023-05-05 DIAGNOSIS — H35033 Hypertensive retinopathy, bilateral: Secondary | ICD-10-CM | POA: Diagnosis not present

## 2023-05-05 DIAGNOSIS — H43813 Vitreous degeneration, bilateral: Secondary | ICD-10-CM | POA: Diagnosis not present

## 2023-05-05 DIAGNOSIS — E113291 Type 2 diabetes mellitus with mild nonproliferative diabetic retinopathy without macular edema, right eye: Secondary | ICD-10-CM | POA: Diagnosis not present

## 2023-05-05 DIAGNOSIS — H34832 Tributary (branch) retinal vein occlusion, left eye, with macular edema: Secondary | ICD-10-CM | POA: Diagnosis not present

## 2023-05-05 DIAGNOSIS — E113212 Type 2 diabetes mellitus with mild nonproliferative diabetic retinopathy with macular edema, left eye: Secondary | ICD-10-CM | POA: Diagnosis not present

## 2023-05-05 DIAGNOSIS — H401132 Primary open-angle glaucoma, bilateral, moderate stage: Secondary | ICD-10-CM | POA: Diagnosis not present

## 2023-05-13 ENCOUNTER — Other Ambulatory Visit: Payer: Self-pay | Admitting: Family Medicine

## 2023-05-13 ENCOUNTER — Other Ambulatory Visit: Payer: Self-pay

## 2023-05-13 MED ORDER — FREESTYLE LIBRE 3 SENSOR MISC: 3 refills | Status: DC

## 2023-05-13 NOTE — Telephone Encounter (Signed)
Telephone Encounter Glean Salen  Encounter Date: 05/13/2023  Signed Medication Refill - Medication: freestyle libre lancets   Has the patient contacted their pharmacy? Yes   (Agent: If no, request that the patient contact the pharmacy for the refill. If patient does not wish to contact the pharmacy document the reason why and proceed with request.) (Agent: If yes, when and what did the pharmacy advise?)   Preferred Pharmacy (with phone number or street name): [facility]  [address] [place], [place], [place] [address]  [phone] Has the patient been seen for an appointment in the last year OR does the patient have an upcoming appointment? yes   Agent: Please be advised that RX refills may take up to 3 business days. We ask that you follow-up with your pharmacy.

## 2023-05-13 NOTE — Telephone Encounter (Signed)
Medication Refill - Medication: Glucose test strips/ freestyle libre lancets  Has the patient contacted their pharmacy? Yes  (Agent: If no, request that the patient contact the pharmacy for the refill. If patient does not wish to contact the pharmacy document the reason why and proceed with request.) (Agent: If yes, when and what did the pharmacy advise?)  Preferred Pharmacy (with phone number or street name): [facility]  [address] [place], [place], [place] [address]  [phone] Has the patient been seen for an appointment in the last year OR does the patient have an upcoming appointment? yes  Agent: Please be advised that RX refills may take up to 3 business days. We ask that you follow-up with your pharmacy.

## 2023-05-13 NOTE — Telephone Encounter (Signed)
Sent request for freestyle libre lancets in a separate encounter to the office.

## 2023-05-14 ENCOUNTER — Other Ambulatory Visit: Payer: Self-pay | Admitting: Physician Assistant

## 2023-05-14 ENCOUNTER — Telehealth: Payer: Self-pay | Admitting: Family Medicine

## 2023-05-14 MED ORDER — LANCETS MISC. MISC: 1.0000 | Freq: Two times a day (BID) | 3 refills | Status: DC

## 2023-05-14 MED ORDER — BLOOD GLUCOSE TEST VI STRP: 1.0000 | ORAL_STRIP | Freq: Two times a day (BID) | 3 refills | Status: DC

## 2023-05-14 NOTE — Telephone Encounter (Signed)
Patent needs prescription for lancets and needles sent to [facility] today. Patient states that someone sent in a refill but sent it to the wrong pharmacy.  Patient states he is currently out and unable to check his blood sugar.

## 2023-05-14 NOTE — Telephone Encounter (Signed)
FYI: Patient is requesting to discontinue sending medications to [facility] as a secondary pharmacy and add [facility] in [place] as the secondary pharmacy. Please update.

## 2023-05-14 NOTE — Telephone Encounter (Signed)
Requested medication (s) are due for refill today: Yes  Requested medication (s) are on the active medication list: Yes  Last refill:  05/20/17  Future visit scheduled: Yes  Notes to clinic:  Rx was last filled 5 years ago     Requested Prescriptions  Pending Prescriptions Disp Refills   glucose blood test strip 200 each 4    Sig: 1 each by Other route as needed for other. Use contour next test strip to check sugar 2 times daily     Endocrinology: Diabetes - Testing Supplies Passed - 05/13/2023 11:04 AM      Passed - Valid encounter within last 12 months    Recent Outpatient Visits           2 months ago Controlled type 2 diabetes mellitus with complication, without long-term current use of insulin (HCC)   [place] [facility] [place], [name] P, DO   3 months ago Controlled type 2 diabetes mellitus with complication, without long-term current use of insulin (HCC)   [place] [facility] [place], [name] P, DO   4 months ago Controlled type 2 diabetes mellitus with complication, without long-term current use of insulin (HCC)   [place] [facility] [place], [name] P, DO   7 months ago Urinary incontinence, unspecified type   [place] [facility] [place], [place], DO   7 months ago Gastroenteritis   [place] [facility] [place], [place], DO       Future Appointments             In 3 weeks Laural Benes, Oralia Rud, DO [place] [facility], [facility]   In 4 months Vanna Scotland, MD [facility] [place]   In 5 months Deirdre Evener, MD [facility] [place] [facility]

## 2023-05-15 MED ORDER — BLOOD GLUCOSE TEST VI STRP: 1.0000 | ORAL_STRIP | Freq: Two times a day (BID) | 3 refills | Status: DC

## 2023-05-15 MED ORDER — LANCETS MISC. MISC: 1.0000 | Freq: Two times a day (BID) | 3 refills | Status: AC

## 2023-06-02 ENCOUNTER — Ambulatory Visit (INDEPENDENT_AMBULATORY_CARE_PROVIDER_SITE_OTHER): Payer: Medicare Other

## 2023-06-02 DIAGNOSIS — E538 Deficiency of other specified B group vitamins: Secondary | ICD-10-CM

## 2023-06-03 ENCOUNTER — Telehealth: Payer: Self-pay | Admitting: Family Medicine

## 2023-06-03 NOTE — Telephone Encounter (Unsigned)
Copied from CRM [id number]. Topic: General - Inquiry >> Jun 03, 2023 11:43 AM Patsy Lager T wrote: Reason for CRM: Andrey Campanile from [facility] pharmacy call center request the ICD-10 code for glucose blood test strip. She can be reached at [phone] option 3

## 2023-06-04 NOTE — Telephone Encounter (Signed)
Called and spoke with pharmacy code was provided

## 2023-06-09 ENCOUNTER — Encounter: Payer: Self-pay | Admitting: Family Medicine

## 2023-06-09 ENCOUNTER — Ambulatory Visit (INDEPENDENT_AMBULATORY_CARE_PROVIDER_SITE_OTHER): Payer: Medicare Other | Admitting: Family Medicine

## 2023-06-09 VITALS — BP 116/67 | HR 67 | Ht 70.0 in | Wt 187.4 lb

## 2023-06-09 DIAGNOSIS — E118 Type 2 diabetes mellitus with unspecified complications: Secondary | ICD-10-CM | POA: Diagnosis not present

## 2023-06-09 DIAGNOSIS — Z7985 Long-term (current) use of injectable non-insulin antidiabetic drugs: Secondary | ICD-10-CM

## 2023-06-09 DIAGNOSIS — E538 Deficiency of other specified B group vitamins: Secondary | ICD-10-CM | POA: Diagnosis not present

## 2023-06-09 DIAGNOSIS — Z23 Encounter for immunization: Secondary | ICD-10-CM | POA: Diagnosis not present

## 2023-06-09 NOTE — Progress Notes (Signed)
BP 116/67   Pulse 67   Ht 5\' 10"  (1.778 m)   Wt 187 lb 6.4 oz (85 kg)   SpO2 98%   BMI 26.89 kg/m    Subjective:    Patient ID: Troy Christensen, male    DOB: 1951-07-14, 72 y.o.   MRN: [id number]  HPI: Troy Christensen is a 72 y.o. male  Chief Complaint  Patient presents with   Diabetes   DIABETES Hypoglycemic episodes:no Polydipsia/polyuria: no Visual disturbance: no Chest pain: no Paresthesias: no Glucose Monitoring: yes  Accucheck frequency: Daily  Fasting glucose: 106 Taking Insulin?: no Blood Pressure Monitoring: not checking Retinal Examination: Up to Date Foot Exam: Up to Date Diabetic Education: Completed Pneumovax: Up to Date Influenza: Up to Date Aspirin: yes   Relevant past medical, surgical, family and social history reviewed and updated as indicated. Interim medical history since our last visit reviewed. Allergies and medications reviewed and updated.  Review of Systems  Constitutional: Negative.   Respiratory: Negative.    Cardiovascular: Negative.   Gastrointestinal: Negative.   Musculoskeletal: Negative.   Neurological: Negative.   Psychiatric/Behavioral: Negative.      Per HPI unless specifically indicated above     Objective:    BP 116/67   Pulse 67   Ht 5\' 10"  (1.778 m)   Wt 187 lb 6.4 oz (85 kg)   SpO2 98%   BMI 26.89 kg/m   Wt Readings from Last 3 Encounters:  06/09/23 187 lb 6.4 oz (85 kg)  03/12/23 192 lb (87.1 kg)  01/29/23 199 lb 6 oz (90.4 kg)    Physical Exam Vitals and nursing note reviewed.  Constitutional:      General: He is not in acute distress.    Appearance: Normal appearance. He is not ill-appearing, toxic-appearing or diaphoretic.  HENT:     Head: Normocephalic and atraumatic.     Right Ear: External ear normal.     Left Ear: External ear normal.     Nose: Nose normal.     Mouth/Throat:     Mouth: Mucous membranes are moist.     Pharynx: Oropharynx is clear.  Eyes:     General: No scleral  icterus.       Right eye: No discharge.        Left eye: No discharge.     Extraocular Movements: Extraocular movements intact.     Conjunctiva/sclera: Conjunctivae normal.     Pupils: Pupils are equal, round, and reactive to light.  Cardiovascular:     Rate and Rhythm: Normal rate and regular rhythm.     Pulses: Normal pulses.     Heart sounds: Normal heart sounds. No murmur heard.    No friction rub. No gallop.  Pulmonary:     Effort: Pulmonary effort is normal. No respiratory distress.     Breath sounds: Normal breath sounds. No stridor. No wheezing, rhonchi or rales.  Chest:     Chest wall: No tenderness.  Musculoskeletal:        General: Normal range of motion.     Cervical back: Normal range of motion and neck supple.  Skin:    General: Skin is warm and dry.     Capillary Refill: Capillary refill takes less than 2 seconds.     Coloration: Skin is not jaundiced or pale.     Findings: No bruising, erythema, lesion or rash.  Neurological:     General: No focal deficit present.     Mental Status:  He is alert and oriented to person, place, and time. Mental status is at baseline.  Psychiatric:        Mood and Affect: Mood normal.        Behavior: Behavior normal.        Thought Content: Thought content normal.        Judgment: Judgment normal.     Results for orders placed or performed in visit on 03/17/23  Fecal occult blood, imunochemical([facility])   Specimen: Stool   ST  Result Value Ref Range   Fecal Occult Bld Negative Negative  B12  Result Value Ref Range   Vitamin B-12 123 (L) 232 - 1,245 pg/mL  Ferritin  Result Value Ref Range   Ferritin 67 30 - 400 ng/mL  Iron Binding Cap (TIBC)([facility])  Result Value Ref Range   Total Iron Binding Capacity 356 250 - 450 ug/dL   UIBC 161 096 - 045 ug/dL   Iron 96 38 - 409 ug/dL   Iron Saturation 27 15 - 55 %      Assessment & Plan:   Problem List Items Addressed This Visit       Endocrine    Controlled diabetes mellitus type 2 with complications (HCC) - Primary    Rechecking labs today. Await results. Treat as needed- goal of stopping actos and continuing mounjaro. Doesn't really want to go up on his mounjaro at this time.       Relevant Orders   Hgb A1c w/o eAG     Other   B12 deficiency    Rechecking labs today. Await results. Continue B12 shots.      Relevant Orders   B12   Other Visit Diagnoses     Needs flu shot       Flu shot given today.   Relevant Orders   Flu Vaccine Trivalent High Dose (Fluad) (Completed)        Follow up plan: Return in about 3 months (around 09/08/2023).

## 2023-06-09 NOTE — Assessment & Plan Note (Signed)
Rechecking labs today. Await results. Treat as needed- goal of stopping actos and continuing mounjaro. Doesn't really want to go up on his mounjaro at this time.

## 2023-06-09 NOTE — Assessment & Plan Note (Signed)
Rechecking labs today. Await results. Continue B12 shots.

## 2023-06-10 LAB — VITAMIN B12: Vitamin B-12: 417 pg/mL (ref 232–1245)

## 2023-06-10 LAB — HGB A1C W/O EAG: Hgb A1c MFr Bld: 6.1 % — ABNORMAL HIGH (ref 4.8–5.6)

## 2023-06-11 ENCOUNTER — Encounter: Payer: Self-pay | Admitting: Family Medicine

## 2023-06-11 ENCOUNTER — Other Ambulatory Visit: Payer: Self-pay | Admitting: Family Medicine

## 2023-07-02 ENCOUNTER — Ambulatory Visit: Payer: Medicare Other

## 2023-07-07 ENCOUNTER — Ambulatory Visit: Payer: Medicare Other

## 2023-07-07 DIAGNOSIS — E538 Deficiency of other specified B group vitamins: Secondary | ICD-10-CM | POA: Diagnosis not present

## 2023-07-14 DIAGNOSIS — H34832 Tributary (branch) retinal vein occlusion, left eye, with macular edema: Secondary | ICD-10-CM | POA: Diagnosis not present

## 2023-07-14 DIAGNOSIS — E113291 Type 2 diabetes mellitus with mild nonproliferative diabetic retinopathy without macular edema, right eye: Secondary | ICD-10-CM | POA: Diagnosis not present

## 2023-07-14 DIAGNOSIS — H35033 Hypertensive retinopathy, bilateral: Secondary | ICD-10-CM | POA: Diagnosis not present

## 2023-07-14 DIAGNOSIS — E113212 Type 2 diabetes mellitus with mild nonproliferative diabetic retinopathy with macular edema, left eye: Secondary | ICD-10-CM | POA: Diagnosis not present

## 2023-07-14 DIAGNOSIS — H43813 Vitreous degeneration, bilateral: Secondary | ICD-10-CM | POA: Diagnosis not present

## 2023-07-14 DIAGNOSIS — H401132 Primary open-angle glaucoma, bilateral, moderate stage: Secondary | ICD-10-CM | POA: Diagnosis not present

## 2023-07-29 ENCOUNTER — Other Ambulatory Visit: Payer: Medicare Other

## 2023-07-30 ENCOUNTER — Inpatient Hospital Stay: Payer: Medicare Other | Attending: Radiation Oncology

## 2023-07-30 DIAGNOSIS — C61 Malignant neoplasm of prostate: Secondary | ICD-10-CM | POA: Insufficient documentation

## 2023-07-30 LAB — PSA: Prostatic Specific Antigen: 0.37 ng/mL (ref 0.00–4.00)

## 2023-08-04 ENCOUNTER — Encounter: Payer: Self-pay | Admitting: Family Medicine

## 2023-08-06 ENCOUNTER — Encounter: Payer: Self-pay | Admitting: Radiation Oncology

## 2023-08-06 ENCOUNTER — Ambulatory Visit (INDEPENDENT_AMBULATORY_CARE_PROVIDER_SITE_OTHER): Payer: Medicare Other

## 2023-08-06 ENCOUNTER — Other Ambulatory Visit: Payer: Self-pay | Admitting: *Deleted

## 2023-08-06 ENCOUNTER — Ambulatory Visit
Admission: RE | Admit: 2023-08-06 | Discharge: 2023-08-06 | Disposition: A | Discharge status: Home / Self Care | Payer: Medicare Other | Source: Ambulatory Visit | Attending: Radiation Oncology | Admitting: Radiation Oncology

## 2023-08-06 VITALS — BP 129/78 | HR 64 | Temp 98.5°F | Resp 16 | Wt 185.0 lb

## 2023-08-06 DIAGNOSIS — E538 Deficiency of other specified B group vitamins: Secondary | ICD-10-CM

## 2023-08-06 DIAGNOSIS — C61 Malignant neoplasm of prostate: Secondary | ICD-10-CM | POA: Diagnosis not present

## 2023-08-06 DIAGNOSIS — Z191 Hormone sensitive malignancy status: Secondary | ICD-10-CM | POA: Diagnosis not present

## 2023-08-06 DIAGNOSIS — Z923 Personal history of irradiation: Secondary | ICD-10-CM | POA: Diagnosis not present

## 2023-08-06 NOTE — Progress Notes (Signed)
Radiation Oncology Follow up Note  Name: LODEN ALBER   Date:   08/06/2023 MRN:  [id number] DOB: 09-12-1951    This 71 y.o. male presents to the clinic today for 2-1/2-year follow-up status post IMRT radiation therapy for Gleason 8 (4+4) adenocarcinoma the prostate and underwent salvage treatment for biochemical failure now out 2-1/2 years I have elected for active running after start eating up and make.  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 He initially presented with a PSA of 6.3, which has gradually increased to 0.37 from 0.04 a year ago. He reports no pain and normal bowel movements. He has been seeing another doctor, Dr. Apolinar Junes, and last saw her a month ago. He is scheduled to see her again soon..  Patient specifically denies any increased lower urinary tract symptoms diarrhea or fatigue.  COMPLICATIONS OF TREATMENT: none  FOLLOW UP COMPLIANCE: keeps appointments   PHYSICAL EXAM:  BP 129/78   Pulse 64   Temp 98.5 F (36.9 C) (Tympanic)   Resp 16   Wt 185 lb (83.9 kg)   BMI 26.54 kg/m  Well-developed well-nourished patient in NAD. HEENT reveals PERLA, EOMI, discs not visualized.  Oral cavity is clear. No oral mucosal lesions are identified. Neck is clear without evidence of cervical or supraclavicular adenopathy. Lungs are clear to A&P. Cardiac examination is essentially unremarkable with regular rate and rhythm without murmur rub or thrill. Abdomen is benign with no organomegaly or masses noted. Motor sensory and DTR levels are equal and symmetric in the upper and lower extremities. Cranial nerves II through XII are grossly intact. Proprioception is intact. No peripheral adenopathy or edema is identified. No motor or sensory  levels are noted. Crude visual fields are within normal range.  RADIOLOGY RESULTS: No current films to review  PLAN: Prostate Cancer Treated with IMRT radiation therapy and I-125 interstitial implant for salvage. PSA has increased from 0.04 to 0.37 over the past year. No current symptoms. Discussed the possibility of restarting Eligard if PSA continues to rise. -Continue monitoring PSA levels with Dr. Apolinar Junes. -Consider Eligard therapy if PSA continues to rise. -Follow-up in 6 months to monitor progress.    Carmina Miller, MD

## 2023-08-09 ENCOUNTER — Other Ambulatory Visit: Payer: Self-pay | Admitting: Radiation Oncology

## 2023-08-25 ENCOUNTER — Telehealth: Payer: Self-pay | Admitting: Urology

## 2023-08-25 NOTE — Telephone Encounter (Signed)
Patient dropped in office and stated that he had his labs done in November at the [facility] and saw Dr. Rushie Chestnut. He said he doesn't think he needs to have his labs done again in January with [place]. He wants to keep the appointment with Dr. Apolinar Junes, but cancel the labs. Please advise patient.

## 2023-08-25 NOTE — Telephone Encounter (Signed)
Agree.  That's fine.    Vanna Scotland, MD

## 2023-08-25 NOTE — Telephone Encounter (Signed)
Patient advised and lab appointment cancelled.

## 2023-08-26 ENCOUNTER — Other Ambulatory Visit: Payer: Self-pay | Admitting: Family Medicine

## 2023-08-26 DIAGNOSIS — T560X1D Toxic effect of lead and its compounds, accidental (unintentional), subsequent encounter: Secondary | ICD-10-CM

## 2023-08-26 DIAGNOSIS — I10 Essential (primary) hypertension: Secondary | ICD-10-CM

## 2023-08-26 NOTE — Telephone Encounter (Signed)
Requested Prescriptions  Pending Prescriptions Disp Refills   amLODipine-benazepril (LOTREL) 5-20 MG capsule [Pharmacy Med Name: AMLODIPINE/BENAZEPRIL CAPS 5/20MG ] 180 capsule 1    Sig: TAKE 2 CAPSULES ONCE DAILY     Cardiovascular: CCB + ACEI Combos Passed - 08/26/2023  2:41 AM      Passed - Cr in normal range and within 180 days    Creatinine, Ser  Date Value Ref Range Status  03/12/2023 1.12 0.76 - 1.27 mg/dL Final         Passed - K in normal range and within 180 days    Potassium  Date Value Ref Range Status  03/12/2023 4.6 3.5 - 5.2 mmol/L Final         Passed - Na in normal range and within 180 days    Sodium  Date Value Ref Range Status  03/12/2023 141 134 - 144 mmol/L Final         Passed - eGFR is 30 or above and within 180 days    GFR calc Af Amer  Date Value Ref Range Status  07/31/2020 80 >59 mL/min/1.73 Final    Comment:    **In accordance with recommendations from the NKF-ASN Task force,**   [facility] is in the process of updating its eGFR calculation to the   2021 CKD-EPI creatinine equation that estimates kidney function   without a race variable.    GFR, Estimated  Date Value Ref Range Status  09/22/2022 >60 >60 mL/min Final    Comment:    (NOTE) Calculated using the CKD-EPI Creatinine Equation (2021)    eGFR  Date Value Ref Range Status  03/12/2023 70 >59 mL/min/1.73 Final         Passed - Patient is not pregnant      Passed - Last BP in normal range    BP Readings from Last 1 Encounters:  08/06/23 129/78         Passed - Valid encounter within last 6 months    Recent Outpatient Visits           2 months ago Controlled type 2 diabetes mellitus with complication, without long-term current use of insulin (HCC)   [place] [facility] [place], [name] P, DO   5 months ago Controlled type 2 diabetes mellitus with complication, without long-term current use of insulin (HCC)   [place] [facility], [name] P,  DO   7 months ago Controlled type 2 diabetes mellitus with complication, without long-term current use of insulin (HCC)   [place] [facility], [name] P, DO   8 months ago Controlled type 2 diabetes mellitus with complication, without long-term current use of insulin (HCC)   [place] [facility] [place], [name] P, DO   10 months ago Urinary incontinence, unspecified type   [place] [facility] Dorcas Carrow, DO       Future Appointments             In 2 weeks Dorcas Carrow, DO [facility]   In 1 month Vanna Scotland, MD [facility] [place]   In 2 months Deirdre Evener, MD [facility]             allopurinol (ZYLOPRIM) 100 MG tablet [Pharmacy Med Name: ALLOPURINOL TABS 100MG ] 90 tablet 0    Sig: TAKE 1 TABLET DAILY     Endocrinology:  Gout Agents - allopurinol Passed - 08/26/2023  2:41 AM      Passed - Uric Acid in normal range and within 360 days    Uric Acid  Date Value Ref Range Status  09/04/2022 4.7 3.8 - 8.4 mg/dL Final    Comment:               Therapeutic target for gout patients: <6.0         Passed - Cr in normal range and within 360 days    Creatinine, Ser  Date Value Ref Range Status  03/12/2023 1.12 0.76 - 1.27 mg/dL Final         Passed - Valid encounter within last 12 months    Recent Outpatient Visits           2 months ago Controlled type 2 diabetes mellitus with complication, without long-term current use of insulin (HCC)   [place] [facility] [place], [name] P, DO   5 months ago Controlled type 2 diabetes mellitus with complication, without long-term current use of insulin (HCC)   [place] [facility] [place] [place], [name] P, DO   7 months ago Controlled type 2 diabetes mellitus with complication, without long-term current use of insulin (HCC)   [place] [facility] [place], [name] P,  DO   8 months ago Controlled type 2 diabetes mellitus with complication, without long-term current use of insulin (HCC)   [place] [facility] [place], [name] P, DO   10 months ago Urinary incontinence, unspecified type   [place] [facility] [place], Oralia Rud, DO       Future Appointments             In 2 weeks Dorcas Carrow, DO [place] [facility], [facility]   In 1 month Vanna Scotland, MD [facility] [place]   In 2 months Deirdre Evener, MD [place] [facility]            Passed - CBC within normal limits and completed in the last 12 months    WBC  Date Value Ref Range Status  03/12/2023 3.0 (L) 3.4 - 10.8 x10E3/uL Final  09/22/2022 3.6 (L) 4.0 - 10.5 K/uL Final   RBC  Date Value Ref Range Status  03/12/2023 3.64 (L) 4.14 - 5.80 x10E6/uL Final  09/22/2022 4.59 4.22 - 5.81 MIL/uL Final   Hemoglobin  Date Value Ref Range Status  03/12/2023 12.2 (L) 13.0 - 17.7 g/dL Final   Hematocrit  Date Value Ref Range Status  03/12/2023 35.8 (L) 37.5 - 51.0 % Final   MCHC  Date Value Ref Range Status  03/12/2023 34.1 31.5 - 35.7 g/dL Final  16/06/9603 54.0 30.0 - 36.0 g/dL Final   [facility]  Date Value Ref Range Status  03/12/2023 33.5 (H) 26.6 - 33.0 pg Final  09/22/2022 32.7 26.0 - 34.0 pg Final   MCV  Date Value Ref Range Status  03/12/2023 98 (H) 79 - 97 fL Final   No results found for: "PLTCOUNTKUC", "LABPLAT", "POCPLA" RDW  Date Value Ref Range Status  03/12/2023 14.1 11.6 - 15.4 % Final

## 2023-09-10 ENCOUNTER — Other Ambulatory Visit: Payer: Self-pay | Admitting: *Deleted

## 2023-09-10 ENCOUNTER — Ambulatory Visit: Payer: Medicare Other | Admitting: Family Medicine

## 2023-09-10 MED ORDER — TAMSULOSIN HCL 0.4 MG PO CAPS: 0.4000 mg | ORAL_CAPSULE | Freq: Every day | ORAL | 2 refills | Status: DC

## 2023-09-11 ENCOUNTER — Telehealth: Payer: Self-pay | Admitting: Family Medicine

## 2023-09-11 NOTE — Telephone Encounter (Signed)
FYI........Troy KitchenPt's spouse came into the office without the patient because she was wanting to inform the provider of her concerns of the patient forgetting a lot.  She stated that his mother had Alzheimer's with dementia and she wasn't sure if it was hereditary.  She stated that she has noticed that he has forgotten things more than usual around her.  As well as she spoke to one of his coworkers and he told her that one day he was trying to clock in at work and couldn't remember how to clock in.  She is concerned that he needs to be checked but she is trying not to let him know that she brought it to the provider's attention.

## 2023-09-11 NOTE — Telephone Encounter (Signed)
Will discuss at appointment.

## 2023-09-14 ENCOUNTER — Ambulatory Visit: Payer: Medicare Other | Admitting: Family Medicine

## 2023-09-15 ENCOUNTER — Encounter: Payer: Self-pay | Admitting: Family Medicine

## 2023-09-15 ENCOUNTER — Ambulatory Visit: Payer: Medicare Other | Admitting: Family Medicine

## 2023-09-15 VITALS — BP 124/57 | HR 72 | Wt 180.4 lb

## 2023-09-15 DIAGNOSIS — I700 Atherosclerosis of aorta: Secondary | ICD-10-CM | POA: Diagnosis not present

## 2023-09-15 DIAGNOSIS — E538 Deficiency of other specified B group vitamins: Secondary | ICD-10-CM

## 2023-09-15 DIAGNOSIS — E118 Type 2 diabetes mellitus with unspecified complications: Secondary | ICD-10-CM | POA: Diagnosis not present

## 2023-09-15 DIAGNOSIS — T560X1D Toxic effect of lead and its compounds, accidental (unintentional), subsequent encounter: Secondary | ICD-10-CM

## 2023-09-15 DIAGNOSIS — Z7984 Long term (current) use of oral hypoglycemic drugs: Secondary | ICD-10-CM

## 2023-09-15 DIAGNOSIS — Z7985 Long-term (current) use of injectable non-insulin antidiabetic drugs: Secondary | ICD-10-CM | POA: Diagnosis not present

## 2023-09-15 DIAGNOSIS — E785 Hyperlipidemia, unspecified: Secondary | ICD-10-CM

## 2023-09-15 DIAGNOSIS — I10 Essential (primary) hypertension: Secondary | ICD-10-CM | POA: Diagnosis not present

## 2023-09-15 DIAGNOSIS — M109 Gout, unspecified: Secondary | ICD-10-CM

## 2023-09-15 DIAGNOSIS — E7800 Pure hypercholesterolemia, unspecified: Secondary | ICD-10-CM

## 2023-09-15 DIAGNOSIS — Z0000 Encounter for general adult medical examination without abnormal findings: Secondary | ICD-10-CM

## 2023-09-15 DIAGNOSIS — C61 Malignant neoplasm of prostate: Secondary | ICD-10-CM | POA: Diagnosis not present

## 2023-09-15 DIAGNOSIS — M1A1790 Lead-induced chronic gout, unspecified ankle and foot, without tophus (tophi): Secondary | ICD-10-CM

## 2023-09-15 LAB — MICROALBUMIN, URINE WAIVED
Creatinine, Urine Waived: 100 mg/dL (ref 10–300)
Microalb, Ur Waived: 80 mg/L — ABNORMAL HIGH (ref 0–19)

## 2023-09-15 LAB — BAYER DCA HB A1C WAIVED: HB A1C (BAYER DCA - WAIVED): 5.9 % — ABNORMAL HIGH (ref 4.8–5.6)

## 2023-09-15 MED ORDER — METFORMIN HCL ER 500 MG PO TB24: 1000.0000 mg | ORAL_TABLET | Freq: Two times a day (BID) | ORAL | 1 refills | Status: DC

## 2023-09-15 MED ORDER — ALLOPURINOL 100 MG PO TABS: 100.0000 mg | ORAL_TABLET | Freq: Every day | ORAL | 0 refills | Status: DC

## 2023-09-15 MED ORDER — MELOXICAM 15 MG PO TABS: 15.0000 mg | ORAL_TABLET | Freq: Every day | ORAL | 0 refills | Status: DC

## 2023-09-15 MED ORDER — TIRZEPATIDE 7.5 MG/0.5ML ~~LOC~~ SOAJ: 7.5000 mg | SUBCUTANEOUS | 1 refills | Status: DC

## 2023-09-15 MED ORDER — LOVASTATIN 40 MG PO TABS: 40.0000 mg | ORAL_TABLET | Freq: Every day | ORAL | 1 refills | Status: DC

## 2023-09-15 MED ORDER — AMLODIPINE BESY-BENAZEPRIL HCL 5-20 MG PO CAPS: ORAL_CAPSULE | ORAL | 1 refills | Status: DC

## 2023-09-15 NOTE — Patient Instructions (Signed)
 Preventative Services:  Health Risk Assessment and Personalized Prevention Plan: Done today Bone Mass Measurements: N/A CVD Screening: Done today Colon Cancer Screening: up to date Depression Screening: done today Diabetes Screening: done today Glaucoma Screening: see your eye doctor Hepatitis B vaccine: N/A Hepatitis C screening: up to date HIV Screening: up to date Flu Vaccine: up to date Lung cancer Screening: N/A Obesity Screening: done today Pneumonia Vaccines (2): up to date STI Screening: N/A PSA screening:done today

## 2023-09-15 NOTE — Progress Notes (Signed)
 BP (!) 124/57   Pulse 72   Wt 180 lb 6.4 oz (81.8 kg)   SpO2 98%   BMI 25.88 kg/m    Subjective:    Patient ID: Troy Christensen, male    DOB: 1951-08-23, 72 y.o.   MRN: [id number]  HPI: Troy Christensen is a 72 y.o. male presenting on 09/15/2023 for comprehensive medical examination. Current medical complaints include:  DIABETES Hypoglycemic episodes:no Polydipsia/polyuria: no Visual disturbance: no Chest pain: no Paresthesias: no Glucose Monitoring: no  Accucheck frequency: Not Checking Taking Insulin?: no Blood Pressure Monitoring: not checking Retinal Examination: Up to Date Foot Exam: Up to Date Diabetic Education: Completed Pneumovax: Up to Date Influenza: Up to Date Aspirin: yes  HYPERTENSION / HYPERLIPIDEMIA Satisfied with current treatment? yes Duration of hypertension: chronic BP monitoring frequency: not checking BP medication side effects: no Past BP meds: amlodipine , benazepril  Duration of hyperlipidemia: chronic Cholesterol medication side effects: no Cholesterol supplements: none Past cholesterol medications: lovastatin  Medication compliance: excellent compliance Aspirin: yes Recent stressors: no Recurrent headaches: no Visual changes: no Palpitations: no Dyspnea: no Chest pain: no Lower extremity edema: no Dizzy/lightheaded: no  He currently lives with: wife Interim Problems from his last visit: no  Functional Status Survey: Is the patient deaf or have difficulty hearing?: No Does the patient have difficulty seeing, even when wearing glasses/contacts?: No Does the patient have difficulty concentrating, remembering, or making decisions?: No Does the patient have difficulty walking or climbing stairs?: No Does the patient have difficulty dressing or bathing?: No Does the patient have difficulty doing errands alone such as visiting a doctor's office or shopping?: No  FALL RISK:    09/15/2023   11:41 AM 06/09/2023    8:48 AM 03/12/2023     8:43 AM 01/15/2023    8:56 AM 12/16/2022    8:45 AM  Fall Risk   Falls in the past year? 0 0 0 0 0  Number falls in past yr: 0 0 0 0 0  Injury with Fall? 0 0 0 0 0  Risk for fall due to :  No Fall Risks No Fall Risks No Fall Risks No Fall Risks  Follow up  Falls evaluation completed Falls evaluation completed Falls evaluation completed Falls evaluation completed    Depression Screen    09/15/2023   11:41 AM 06/09/2023    8:48 AM 03/12/2023    8:43 AM 01/15/2023    8:56 AM 12/16/2022    8:45 AM  Depression screen PHQ 2/9  Decreased Interest 0 0 0 0 0  Down, Depressed, Hopeless 0 0 0 0 0  PHQ - 2 Score 0 0 0 0 0  Altered sleeping  0 0 0 0  Tired, decreased energy  0 0 0 0  Change in appetite  0 3 2 0  Feeling bad or failure about yourself   0 0 0 0  Trouble concentrating  0 0 0 0  Moving slowly or fidgety/restless  0 0 0 0  Suicidal thoughts  0 0 0 0  PHQ-9 Score  0 3 2 0  Difficult doing work/chores  Not difficult at all Not difficult at all Not difficult at all Not difficult at all   Advanced Directives Does patient have a HCPOA?    yes If yes, name and contact information:  Does patient have a living will or MOST form?  yes  Past Medical History:  Past Medical History:  Diagnosis Date   Actinic keratosis    Cancer (  HCC)    Prostate   Diabetes mellitus without complication (HCC)    Glaucoma    Gout    Hypercholesteremia    Hypertension    Osteoarthritis    hands   Prostate cancer [facility])     Surgical History:  Past Surgical History:  Procedure Laterality Date   EYE SURGERY Bilateral    march and april 2021- [place]    FLEXIBLE SIGMOIDOSCOPY N/A 01/10/2016   Procedure: FLEXIBLE SIGMOIDOSCOPY with  argon plasma coagulation;  Surgeon: Rogelia Copping, MD;  Location: [facility];  Service: Endoscopy;  Laterality: N/A;  Diabetic - oral meds   HERNIA REPAIR     JOINT REPLACEMENT  2014   Right Knee Replacement   MENISCUS REPAIR Left    nephrolithiasis     pt  denies   PROSTATE SURGERY     Radiation treatments - no surgery   RADIOACTIVE SEED IMPLANT N/A 03/25/2021   Procedure: RADIOACTIVE SEED IMPLANT/BRACHYTHERAPY IMPLANT;  Surgeon: Penne Knee, MD;  Location: [facility];  Service: Urology;  Laterality: N/A;   RETINAL DETACHMENT SURGERY Bilateral    One surgery in March 2021, and Second in April 2021   ROTATOR CUFF REPAIR Right 2012    Medications:  Current Outpatient Medications on File Prior to Visit  Medication Sig   aspirin EC 81 MG tablet Take 81 mg by mouth 3 (three) times a week. Pt states 2-3 times weekly   Glucose Blood (BLOOD GLUCOSE TEST STRIPS) STRP 1 each by In Vitro route in the morning and at bedtime. May substitute to any manufacturer covered by patient's insurance.   glucose blood test strip 1 each by Other route as needed for other. Use contour next test strip to check sugar 2 times daily   Lancets Misc. MISC 1 each by Does not apply route in the morning and at bedtime. May substitute to any manufacturer covered by patient's insurance.   Omega-3 Fatty Acids (FISH OIL PO) Take 1 capsule by mouth daily.   tamsulosin  (FLOMAX ) 0.4 MG CAPS capsule Take 1 capsule (0.4 mg total) by mouth daily after supper.   timolol (TIMOPTIC) 0.5 % ophthalmic solution Place 1 drop into both eyes 2 (two) times daily.   tolterodine  (DETROL  LA) 4 MG 24 hr capsule Take 1 capsule (4 mg total) by mouth daily.   Current Facility-Administered Medications on File Prior to Visit  Medication   ceFAZolin  (ANCEF ) 2 g in dextrose  5 % 100 mL IVPB   cyanocobalamin  (VITAMIN B12) injection 1,000 mcg    Allergies:  Allergies  Allergen Reactions   Farxiga  [Dapagliflozin ] Other (See Comments)    Urinary incontinence   Gabapentin  Other (See Comments)    Urinary incontinence   Naproxen  Other (See Comments)    Fatigue, patient states it slows him down     Social History:  Social History   Socioeconomic History   Marital status: Married    Spouse name:  Not on file   Number of children: Not on file   Years of education: Not on file   Highest education level: Not on file  Occupational History   Occupation: retired  Tobacco Use   Smoking status: Former    Current packs/day: 0.00    Types: Cigarettes    Quit date: 03/26/1975    Years since quitting: 48.5    Passive exposure: Past   Smokeless tobacco: Never  Vaping Use   Vaping status: Never Used  Substance and Sexual Activity   Alcohol use: No  Alcohol/week: 0.0 standard drinks of alcohol   Drug use: No   Sexual activity: Not Currently  Other Topics Concern   Not on file  Social History Narrative   [place]; with wife; 5 children. 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 Quit smoking 50 years; no alcohol.    Social Drivers of Corporate Investment Banker Strain: Low Risk  (06/09/2022)   Overall Financial Resource Strain (CARDIA)    Difficulty of Paying Living Expenses: Not hard at all  Food Insecurity: No Food Insecurity (09/15/2023)   Hunger Vital Sign    Worried About Running Out of Food in the Last Year: Never true    Ran Out of Food in the Last Year: Never true  Transportation Needs: No Transportation Needs (09/15/2023)   PRAPARE - Administrator, Civil Service (Medical): No    Lack of Transportation (Non-Medical): No  Physical Activity: Inactive (06/09/2022)   Exercise Vital Sign    Days of Exercise per Week: 0 days    Minutes of Exercise per Session: 0 min  Stress: No Stress Concern Present (06/09/2022)   [employer] of Occupational Health - Occupational Stress Questionnaire    Feeling of Stress : Not at all  Social Connections: Socially Integrated (06/09/2022)   Social Connection and Isolation Panel [NHANES]    Frequency of Communication with Friends and Family: More than three times a week    Frequency of Social Gatherings with Friends and Family: Twice a week    Attends Religious Services: More than 4 times per year    Active  Member of [employer] or Organizations: Yes    Attends Engineer, Structural: More than 4 times per year    Marital Status: Married  Catering Manager Violence: Not At Risk (09/15/2023)   Humiliation, Afraid, Rape, and Kick questionnaire    Fear of Current or Ex-Partner: No    Emotionally Abused: No    Physically Abused: No    Sexually Abused: No   Social History   Tobacco Use  Smoking Status Former   Current packs/day: 0.00   Types: Cigarettes   Quit date: 03/26/1975   Years since quitting: 48.5   Passive exposure: Past  Smokeless Tobacco Never   Social History   Substance and Sexual Activity  Alcohol Use No   Alcohol/week: 0.0 standard drinks of alcohol    Family History:  Family History  Problem Relation Age of Onset   Cancer Father 61       lung   Emphysema Mother    Dementia Mother    Cancer Brother        melanoma    Past medical history, surgical history, medications, allergies, family history and social history reviewed with patient today and changes made to appropriate areas of the chart.   Review of Systems  Constitutional: Negative.   HENT: Negative.    Eyes: Negative.   Respiratory: Negative.    Cardiovascular: Negative.   Gastrointestinal:  Positive for constipation. Negative for abdominal pain, blood in stool, diarrhea, heartburn, melena, nausea and vomiting.  Genitourinary: Negative.   Musculoskeletal: Negative.   Skin: Negative.   Neurological: Negative.   Endo/Heme/Allergies: Negative.   Psychiatric/Behavioral:  Positive for memory loss. Negative for depression, hallucinations, substance abuse and suicidal ideas. The patient is not nervous/anxious and does not have insomnia.    All other ROS negative except what is listed above and in the HPI.      Objective:  BP (!) 124/57   Pulse 72   Wt 180 lb 6.4 oz (81.8 kg)   SpO2 98%   BMI 25.88 kg/m   Wt Readings from Last 3 Encounters:  09/15/23 180 lb 6.4 oz (81.8 kg)  08/06/23 185 lb  (83.9 kg)  06/09/23 187 lb 6.4 oz (85 kg)    Physical Exam Vitals and nursing note reviewed.  Constitutional:      General: He is not in acute distress.    Appearance: Normal appearance. He is not ill-appearing, toxic-appearing or diaphoretic.  HENT:     Head: Normocephalic and atraumatic.     Right Ear: Tympanic membrane, ear canal and external ear normal. There is no impacted cerumen.     Left Ear: Tympanic membrane, ear canal and external ear normal. There is no impacted cerumen.     Nose: Nose normal. No congestion or rhinorrhea.     Mouth/Throat:     Mouth: Mucous membranes are moist.     Pharynx: Oropharynx is clear. No oropharyngeal exudate or posterior oropharyngeal erythema.  Eyes:     General: No scleral icterus.       Right eye: No discharge.        Left eye: No discharge.     Extraocular Movements: Extraocular movements intact.     Conjunctiva/sclera: Conjunctivae normal.     Pupils: Pupils are equal, round, and reactive to light.  Neck:     Vascular: No carotid bruit.  Cardiovascular:     Rate and Rhythm: Normal rate and regular rhythm.     Pulses: Normal pulses.     Heart sounds: No murmur heard.    No friction rub. No gallop.  Pulmonary:     Effort: Pulmonary effort is normal. No respiratory distress.     Breath sounds: Normal breath sounds. No stridor. No wheezing, rhonchi or rales.  Chest:     Chest wall: No tenderness.  Abdominal:     General: Abdomen is flat. Bowel sounds are normal. There is no distension.     Palpations: Abdomen is soft. There is no mass.     Tenderness: There is no abdominal tenderness. There is no right CVA tenderness, left CVA tenderness, guarding or rebound.     Hernia: No hernia is present.  Genitourinary:    Comments: Genital exam deferred with shared decision making Musculoskeletal:        General: No swelling, tenderness, deformity or signs of injury.     Cervical back: Normal range of motion and neck supple. No rigidity. No  muscular tenderness.     Right lower leg: No edema.     Left lower leg: No edema.  Lymphadenopathy:     Cervical: No cervical adenopathy.  Skin:    General: Skin is warm and dry.     Capillary Refill: Capillary refill takes less than 2 seconds.     Coloration: Skin is pale. Skin is not jaundiced.     Findings: No bruising, erythema, lesion or rash.  Neurological:     General: No focal deficit present.     Mental Status: He is alert and oriented to person, place, and time.     Cranial Nerves: No cranial nerve deficit.     Sensory: No sensory deficit.     Motor: No weakness.     Coordination: Coordination normal.     Gait: Gait normal.     Deep Tendon Reflexes: Reflexes normal.  Psychiatric:        Mood and  Affect: Mood normal.        Behavior: Behavior normal.        Thought Content: Thought content normal.        Judgment: Judgment normal.        09/15/2023   11:42 AM 06/09/2022   10:10 AM 06/07/2021    4:32 PM 10/25/2018   11:26 AM 10/15/2017    8:39 AM  6CIT Screen  What Year? 0 points 0 points 0 points 0 points 0 points  What month? 0 points 0 points 0 points 0 points 0 points  What time? 0 points 0 points 0 points 0 points 0 points  Count back from 20 0 points 0 points 0 points 0 points 0 points  Months in reverse 0 points 0 points 0 points 0 points 0 points  Repeat phrase 4 points 0 points 0 points 0 points 0 points  Total Score 4 points 0 points 0 points 0 points 0 points    Results for orders placed or performed in visit on 09/15/23  Microalbumin, Urine Waived   Collection Time: 09/15/23  9:16 AM  Result Value Ref Range   Microalb, Ur Waived 80 (H) 0 - 19 mg/L   Creatinine, Urine Waived 100 10 - 300 mg/dL   Microalb/Creat Ratio 30-300 (H) <30 mg/g  Bayer DCA Hb A1c Waived   Collection Time: 09/15/23  9:16 AM  Result Value Ref Range   HB A1C (BAYER DCA - WAIVED) 5.9 (H) 4.8 - 5.6 %  Comprehensive metabolic panel   Collection Time: 09/15/23  9:17 AM  Result  Value Ref Range   Glucose 114 (H) 70 - 99 mg/dL   BUN 17 8 - 27 mg/dL   Creatinine, Ser 8.89 0.76 - 1.27 mg/dL   eGFR 71 >40 fO/fpw/8.26   BUN/Creatinine Ratio 15 10 - 24   Sodium 140 134 - 144 mmol/L   Potassium 4.4 3.5 - 5.2 mmol/L   Chloride 101 96 - 106 mmol/L   CO2 23 20 - 29 mmol/L   Calcium 9.5 8.6 - 10.2 mg/dL   Total Protein 6.5 6.0 - 8.5 g/dL   Albumin 4.6 3.8 - 4.8 g/dL   Globulin, Total 1.9 1.5 - 4.5 g/dL   Bilirubin Total 0.4 0.0 - 1.2 mg/dL   Alkaline Phosphatase 52 44 - 121 IU/L   AST 15 0 - 40 IU/L   ALT 14 0 - 44 IU/L  CBC with Differential/Platelet   Collection Time: 09/15/23  9:17 AM  Result Value Ref Range   WBC 3.3 (L) 3.4 - 10.8 x10E3/uL   RBC 3.73 (L) 4.14 - 5.80 x10E6/uL   Hemoglobin 12.5 (L) 13.0 - 17.7 g/dL   Hematocrit 62.9 (L) 62.4 - 51.0 %   MCV 99 (H) 79 - 97 fL   MCH 33.5 (H) 26.6 - 33.0 pg   MCHC 33.8 31.5 - 35.7 g/dL   RDW 86.9 88.3 - 84.5 %   Platelets 175 150 - 450 x10E3/uL   Neutrophils 50 Not Estab. %   Lymphs 34 Not Estab. %   Monocytes 9 Not Estab. %   Eos 6 Not Estab. %   Basos 1 Not Estab. %   Neutrophils Absolute 1.6 1.4 - 7.0 x10E3/uL   Lymphocytes Absolute 1.1 0.7 - 3.1 x10E3/uL   Monocytes Absolute 0.3 0.1 - 0.9 x10E3/uL   EOS (ABSOLUTE) 0.2 0.0 - 0.4 x10E3/uL   Basophils Absolute 0.0 0.0 - 0.2 x10E3/uL   Immature Granulocytes 0 Not Estab. %  Immature Grans (Abs) 0.0 0.0 - 0.1 x10E3/uL  Lipid Panel w/o Chol/HDL Ratio   Collection Time: 09/15/23  9:17 AM  Result Value Ref Range   Cholesterol, Total 139 100 - 199 mg/dL   Triglycerides 877 0 - 149 mg/dL   HDL 46 >60 mg/dL   VLDL Cholesterol Cal 22 5 - 40 mg/dL   LDL Chol Calc (NIH) 71 0 - 99 mg/dL  PSA   Collection Time: 09/15/23  9:17 AM  Result Value Ref Range   Prostate Specific Ag, Serum 0.5 0.0 - 4.0 ng/mL  TSH   Collection Time: 09/15/23  9:17 AM  Result Value Ref Range   TSH 1.570 0.450 - 4.500 uIU/mL  B12   Collection Time: 09/15/23  9:17 AM  Result Value  Ref Range   Vitamin B-12 223 (L) 232 - 1,245 pg/mL      Assessment & Plan:   Problem List Items Addressed This Visit       Cardiovascular and Mediastinum   Hypertension   Under good control on current regimen. Continue current regimen. Continue to monitor. Call with any concerns. Refills given.  Labs drawn today.       Relevant Medications   amLODipine -benazepril  (LOTREL) 5-20 MG capsule   lovastatin  (MEVACOR ) 40 MG tablet   Other Relevant Orders   Comprehensive metabolic panel (Completed)   CBC with Differential/Platelet (Completed)   TSH (Completed)   Microalbumin, Urine Waived (Completed)   Aortic atherosclerosis (HCC)   Will keep his BP and cholesterol under good control. Continue to monitor. Cal with any concerns.       Relevant Medications   amLODipine -benazepril  (LOTREL) 5-20 MG capsule   lovastatin  (MEVACOR ) 40 MG tablet   Other Relevant Orders   Comprehensive metabolic panel (Completed)   CBC with Differential/Platelet (Completed)   Lipid Panel w/o Chol/HDL Ratio (Completed)     Endocrine   Controlled diabetes mellitus type 2 with complications (HCC)   Doing well with A1c of 5.9. Will cut his mounjaro  down to 7.5mg . Recheck in 3 months. Call with any concerns.       Relevant Medications   tirzepatide  (MOUNJARO ) 7.5 MG/0.5ML Pen   amLODipine -benazepril  (LOTREL) 5-20 MG capsule   lovastatin  (MEVACOR ) 40 MG tablet   metFORMIN  (GLUCOPHAGE -XR) 500 MG 24 hr tablet   Other Relevant Orders   Comprehensive metabolic panel (Completed)   CBC with Differential/Platelet (Completed)   Bayer DCA Hb A1c Waived (Completed)     Genitourinary   Prostate cancer (HCC)   Rechecking labs today. Await results. Treat as needed. Continue to follow with oncology.      Relevant Medications   allopurinol  (ZYLOPRIM ) 100 MG tablet   Other Relevant Orders   Comprehensive metabolic panel (Completed)   CBC with Differential/Platelet (Completed)   PSA (Completed)     Other    Hyperlipidemia   Under good control on current regimen. Continue current regimen. Continue to monitor. Call with any concerns. Refills given.  Labs drawn today.       Relevant Medications   amLODipine -benazepril  (LOTREL) 5-20 MG capsule   lovastatin  (MEVACOR ) 40 MG tablet   Other Relevant Orders   Comprehensive metabolic panel (Completed)   CBC with Differential/Platelet (Completed)   Lipid Panel w/o Chol/HDL Ratio (Completed)   Gout   Under good control on current regimen. Continue current regimen. Continue to monitor. Call with any concerns. Refills given.  Labs drawn today.       Relevant Medications   allopurinol  (ZYLOPRIM ) 100 MG tablet  B12 deficiency   Rechecking labs today. Await results. Treat as needed.       Relevant Orders   Comprehensive metabolic panel (Completed)   CBC with Differential/Platelet (Completed)   B12 (Completed)   Other Visit Diagnoses       Encounter for Medicare annual wellness exam    -  Primary   Vaccines up to date. Screening labs checked today. Colonoscopy up to date. Continue diet and exercise. Call with any concerns.        Preventative Services:  Health Risk Assessment and Personalized Prevention Plan: Done today Bone Mass Measurements: N/A CVD Screening: Done today Colon Cancer Screening: up to date Depression Screening: done today Diabetes Screening: done today Glaucoma Screening: see your eye doctor Hepatitis B vaccine: N/A Hepatitis C screening: up to date HIV Screening: up to date Flu Vaccine: up to date Lung cancer Screening: N/A Obesity Screening: done today Pneumonia Vaccines (2): up to date STI Screening: N/A PSA screening:done today  Discussed aspirin prophylaxis for myocardial infarction prevention and decision was made to continue ASA  LABORATORY TESTING:  Health maintenance labs ordered today as discussed above.   The natural history of prostate cancer and ongoing controversy regarding screening and  potential treatment outcomes of prostate cancer has been discussed with the patient. The meaning of a false positive PSA and a false negative PSA has been discussed. He indicates understanding of the limitations of this screening test and wishes to proceed with screening PSA testing.   IMMUNIZATIONS:   - Tdap: Tetanus vaccination status reviewed: last tetanus booster within 10 years. - Influenza: Up to date - Pneumovax: Up to date - Prevnar: Up to date - Zostavax vaccine: Up to date  SCREENING: - Colonoscopy: Up to date  Discussed with patient purpose of the colonoscopy is to detect colon cancer at curable precancerous or early stages   PATIENT COUNSELING:    Sexuality: Discussed sexually transmitted diseases, partner selection, use of condoms, avoidance of unintended pregnancy  and contraceptive alternatives.   Advised to avoid cigarette smoking.  I discussed with the patient that most people either abstain from alcohol or drink within safe limits (<=14/week and <=4 drinks/occasion for males, <=7/weeks and <= 3 drinks/occasion for females) and that the risk for alcohol disorders and other health effects rises proportionally with the number of drinks per week and how often a drinker exceeds daily limits.  Discussed cessation/primary prevention of drug use and availability of treatment for abuse.   Diet: Encouraged to adjust caloric intake to maintain  or achieve ideal body weight, to reduce intake of dietary saturated fat and total fat, to limit sodium intake by avoiding high sodium foods and not adding table salt, and to maintain adequate dietary potassium and calcium preferably from fresh fruits, vegetables, and low-fat dairy products.    stressed the importance of regular exercise  Injury prevention: Discussed safety belts, safety helmets, smoke detector, smoking near bedding or upholstery.   Dental health: Discussed importance of regular tooth brushing, flossing, and dental visits.    Follow up plan: NEXT PREVENTATIVE PHYSICAL DUE IN 1 YEAR. Return in about 3 months (around 12/14/2023).

## 2023-09-16 LAB — CBC WITH DIFFERENTIAL/PLATELET
Basophils Absolute: 0 10*3/uL (ref 0.0–0.2)
Basos: 1 %
EOS (ABSOLUTE): 0.2 10*3/uL (ref 0.0–0.4)
Eos: 6 %
Hematocrit: 37 % — ABNORMAL LOW (ref 37.5–51.0)
Hemoglobin: 12.5 g/dL — ABNORMAL LOW (ref 13.0–17.7)
Immature Grans (Abs): 0 10*3/uL (ref 0.0–0.1)
Immature Granulocytes: 0 %
Lymphocytes Absolute: 1.1 10*3/uL (ref 0.7–3.1)
Lymphs: 34 %
MCH: 33.5 pg — ABNORMAL HIGH (ref 26.6–33.0)
MCHC: 33.8 g/dL (ref 31.5–35.7)
MCV: 99 fL — ABNORMAL HIGH (ref 79–97)
Monocytes Absolute: 0.3 10*3/uL (ref 0.1–0.9)
Monocytes: 9 %
Neutrophils Absolute: 1.6 10*3/uL (ref 1.4–7.0)
Neutrophils: 50 %
Platelets: 175 10*3/uL (ref 150–450)
RBC: 3.73 x10E6/uL — ABNORMAL LOW (ref 4.14–5.80)
RDW: 13 % (ref 11.6–15.4)
WBC: 3.3 10*3/uL — ABNORMAL LOW (ref 3.4–10.8)

## 2023-09-16 LAB — COMPREHENSIVE METABOLIC PANEL
ALT: 14 [IU]/L (ref 0–44)
AST: 15 [IU]/L (ref 0–40)
Albumin: 4.6 g/dL (ref 3.8–4.8)
Alkaline Phosphatase: 52 [IU]/L (ref 44–121)
BUN/Creatinine Ratio: 15 (ref 10–24)
BUN: 17 mg/dL (ref 8–27)
Bilirubin Total: 0.4 mg/dL (ref 0.0–1.2)
CO2: 23 mmol/L (ref 20–29)
Calcium: 9.5 mg/dL (ref 8.6–10.2)
Chloride: 101 mmol/L (ref 96–106)
Creatinine, Ser: 1.1 mg/dL (ref 0.76–1.27)
Globulin, Total: 1.9 g/dL (ref 1.5–4.5)
Glucose: 114 mg/dL — ABNORMAL HIGH (ref 70–99)
Potassium: 4.4 mmol/L (ref 3.5–5.2)
Sodium: 140 mmol/L (ref 134–144)
Total Protein: 6.5 g/dL (ref 6.0–8.5)
eGFR: 71 mL/min/{1.73_m2} (ref >59)

## 2023-09-16 LAB — PSA: Prostate Specific Ag, Serum: 0.5 ng/mL (ref 0.0–4.0)

## 2023-09-16 LAB — LIPID PANEL W/O CHOL/HDL RATIO
Cholesterol, Total: 139 mg/dL (ref 100–199)
HDL: 46 mg/dL (ref >39)
LDL Chol Calc (NIH): 71 mg/dL (ref 0–99)
Triglycerides: 122 mg/dL (ref 0–149)
VLDL Cholesterol Cal: 22 mg/dL (ref 5–40)

## 2023-09-16 LAB — VITAMIN B12: Vitamin B-12: 223 pg/mL — ABNORMAL LOW (ref 232–1245)

## 2023-09-16 LAB — TSH: TSH: 1.57 u[IU]/mL (ref 0.450–4.500)

## 2023-09-20 ENCOUNTER — Encounter: Payer: Self-pay | Admitting: Family Medicine

## 2023-09-20 NOTE — Assessment & Plan Note (Signed)
 Rechecking labs today. Await results. Treat as needed. Continue to follow with oncology.

## 2023-09-20 NOTE — Assessment & Plan Note (Signed)
 Will keep his BP and cholesterol under good control. Continue to monitor. Cal with any concerns.

## 2023-09-20 NOTE — Assessment & Plan Note (Signed)
 Rechecking labs today. Await results. Treat as needed.

## 2023-09-20 NOTE — Assessment & Plan Note (Signed)
 Under good control on current regimen. Continue current regimen. Continue to monitor. Call with any concerns. Refills given. Labs drawn today.

## 2023-09-20 NOTE — Assessment & Plan Note (Signed)
 Doing well with A1c of 5.9. Will cut his mounjaro down to 7.5mg . Recheck in 3 months. Call with any concerns.

## 2023-09-22 ENCOUNTER — Ambulatory Visit: Payer: Medicare Other

## 2023-09-22 DIAGNOSIS — E113212 Type 2 diabetes mellitus with mild nonproliferative diabetic retinopathy with macular edema, left eye: Secondary | ICD-10-CM | POA: Diagnosis not present

## 2023-09-22 DIAGNOSIS — H34832 Tributary (branch) retinal vein occlusion, left eye, with macular edema: Secondary | ICD-10-CM | POA: Diagnosis not present

## 2023-09-22 DIAGNOSIS — E538 Deficiency of other specified B group vitamins: Secondary | ICD-10-CM

## 2023-09-22 DIAGNOSIS — H43813 Vitreous degeneration, bilateral: Secondary | ICD-10-CM | POA: Diagnosis not present

## 2023-09-22 DIAGNOSIS — E113291 Type 2 diabetes mellitus with mild nonproliferative diabetic retinopathy without macular edema, right eye: Secondary | ICD-10-CM | POA: Diagnosis not present

## 2023-09-22 DIAGNOSIS — H401132 Primary open-angle glaucoma, bilateral, moderate stage: Secondary | ICD-10-CM | POA: Diagnosis not present

## 2023-09-22 DIAGNOSIS — H35033 Hypertensive retinopathy, bilateral: Secondary | ICD-10-CM | POA: Diagnosis not present

## 2023-09-22 NOTE — Progress Notes (Signed)
 Patient presented to clinic for a nurse visit for B 12 injection. 1,000 mcg of cyanocobalamin administered to left deltoid, per patient request. Patient tolerated well, no complications encountered.

## 2023-09-25 ENCOUNTER — Telehealth: Payer: Self-pay | Admitting: Urology

## 2023-09-25 ENCOUNTER — Other Ambulatory Visit: Payer: Medicare Other

## 2023-09-25 NOTE — Telephone Encounter (Signed)
 Patient called and stated that Tolterodine  has stopped working the past few days ; he is still having urine leakage. Also, his urine stream is slow to start. He would like to know if there is another medication that is recommended to try. [facility] is [facility] in [place].

## 2023-09-28 ENCOUNTER — Other Ambulatory Visit: Payer: Self-pay | Admitting: *Deleted

## 2023-09-28 MED ORDER — TAMSULOSIN HCL 0.4 MG PO CAPS: 0.4000 mg | ORAL_CAPSULE | Freq: Every day | ORAL | 2 refills | Status: DC

## 2023-09-28 NOTE — Telephone Encounter (Signed)
 Patient has f/u appt on 10/06/23 that was already scheduled prior. Does patient need to wait until follow up to discuss?

## 2023-09-28 NOTE — Progress Notes (Signed)
 error

## 2023-09-29 NOTE — Telephone Encounter (Signed)
Notified patient as instructed.   

## 2023-09-29 NOTE — Telephone Encounter (Signed)
 Lets discuss this next week.  Vanna Scotland, MD

## 2023-09-30 ENCOUNTER — Ambulatory Visit: Payer: Medicare Other | Admitting: Urology

## 2023-10-06 ENCOUNTER — Ambulatory Visit (INDEPENDENT_AMBULATORY_CARE_PROVIDER_SITE_OTHER): Payer: Medicare Other | Admitting: Urology

## 2023-10-06 VITALS — BP 156/81 | HR 71 | Ht 70.0 in | Wt 178.0 lb

## 2023-10-06 DIAGNOSIS — C61 Malignant neoplasm of prostate: Secondary | ICD-10-CM

## 2023-10-06 DIAGNOSIS — R350 Frequency of micturition: Secondary | ICD-10-CM | POA: Diagnosis not present

## 2023-10-06 LAB — URINALYSIS, COMPLETE
Bilirubin, UA: NEGATIVE
Glucose, UA: NEGATIVE
Ketones, UA: NEGATIVE
Leukocytes,UA: NEGATIVE
Nitrite, UA: NEGATIVE
Specific Gravity, UA: >1.03 — ABNORMAL HIGH (ref 1.005–1.030)
Urobilinogen, Ur: 0.2 mg/dL (ref 0.2–1.0)
pH, UA: 5 (ref 5.0–7.5)

## 2023-10-06 LAB — MICROSCOPIC EXAMINATION
Bacteria, UA: NONE SEEN
RBC, Urine: NONE SEEN /[HPF] (ref 0–2)

## 2023-10-06 LAB — BLADDER SCAN AMB NON-IMAGING: Scan Result: 0

## 2023-10-06 NOTE — Progress Notes (Signed)
I,Amy L Pierron,acting as a scribe for Vanna Scotland, MD.,have documented all relevant documentation on the behalf of Vanna Scotland, MD,as directed by  Vanna Scotland, MD while in the presence of Vanna Scotland, MD.  10/06/2023 6:18 PM   Troy Christensen 1951-09-15 [id number]  Referring provider: Dorcas Carrow, DO [address] [place],  [place] [address]  Chief Complaint  Patient presents with   Urinary Frequency    HPI: 73 year-old male with a personal history of prostate cancer and urge incontinence returns today for a follow-up.   He is status post IMRT with Salvage brachytherapy in 2022.    He was last seen in May of 2024 with urgency, frequency, nocturia, and urge incontinence symptoms. He tried and failed Oxybutynin. He was given samples of Gemtasa for six weeks and advised to continue Flomax. He was also on Farxiga which likely was exacerbating his symptoms. But he was switched to [facility]. He did think Gemtessa was helping, but not cost effective so he was changed to Trospium, however he was no longer responding to this either. At that point in time he was prescribed Detrol LA.  His most recent PSA from 09/15/2023 was 0.5.  He mentions he was taking Flomax twice a day and ran out about a week ago. However, he thinks his symptoms are improved since not taking it. While taking it he often didn't have the urge to urinate but would end up having an accident. But since he stopped taking it, that has only happened one time. He has a weak stream at first.   He doesn't think he has been taking the Detrol or Vesicare.   IPSS     Row Name 10/06/23 0900         International Prostate Symptom Score   How often have you had the sensation of not emptying your bladder? Less than 1 in 5     How often have you had to urinate less than every two hours? Less than 1 in 5 times     How often have you found you stopped and started again several times when you urinated? Less than half the time      How often have you found it difficult to postpone urination? Not at All     How often have you had a weak urinary stream? About half the time     How often have you had to strain to start urination? About half the time     How many times did you typically get up at night to urinate? 2 Times     Total IPSS Score 12       Quality of Life due to urinary symptoms   If you were to spend the rest of your life with your urinary condition just the way it is now how would you feel about that? Mostly Satisfied            Score:  1-7 Mild 8-19 Moderate 20-35 Severe Results for orders placed or performed in visit on 10/06/23  Microscopic Examination   Urine  Result Value Ref Range   WBC, UA 0-5 0 - 5 /hpf   RBC, Urine None seen 0 - 2 /hpf   Epithelial Cells (non renal) 0-10 0 - 10 /hpf   Mucus, UA Present (A) Not Estab.   Bacteria, UA None seen None seen/Few  Urinalysis, Complete  Result Value Ref Range   Specific Gravity, UA >1.030 (H) 1.005 - 1.030  pH, UA 5.0 5.0 - 7.5   Color, UA Yellow Yellow   Appearance Ur Clear Clear   Leukocytes,UA Negative Negative   Protein,UA 1+ (A) Negative/Trace   Glucose, UA Negative Negative   Ketones, UA Negative Negative   RBC, UA Trace (A) Negative   Bilirubin, UA Negative Negative   Urobilinogen, Ur 0.2 0.2 - 1.0 mg/dL   Nitrite, UA Negative Negative   Microscopic Examination See below:   Bladder Scan (Post Void Residual) in office  Result Value Ref Range   Scan Result 0 ml     PMH: Past Medical History:  Diagnosis Date   Actinic keratosis    Cancer (HCC)    Prostate   Diabetes mellitus without complication (HCC)    Glaucoma    Gout    Hypercholesteremia    Hypertension    Osteoarthritis    hands   Prostate cancer [facility])     Surgical History: Past Surgical History:  Procedure Laterality Date   EYE SURGERY Bilateral    march and april 2021- [place]    FLEXIBLE SIGMOIDOSCOPY N/A 01/10/2016   Procedure: FLEXIBLE  SIGMOIDOSCOPY with  argon plasma coagulation;  Surgeon: Midge Minium, MD;  Location: [facility];  Service: Endoscopy;  Laterality: N/A;  Diabetic - oral meds   HERNIA REPAIR     JOINT REPLACEMENT  2014   Right Knee Replacement   MENISCUS REPAIR Left    nephrolithiasis     pt denies   PROSTATE SURGERY     Radiation treatments - no surgery   RADIOACTIVE SEED IMPLANT N/A 03/25/2021   Procedure: RADIOACTIVE SEED IMPLANT/BRACHYTHERAPY IMPLANT;  Surgeon: Vanna Scotland, MD;  Location: [facility] ORS;  Service: Urology;  Laterality: N/A;   RETINAL DETACHMENT SURGERY Bilateral    One surgery in March 2021, and Second in April 2021   ROTATOR CUFF REPAIR Right 2012    Home Medications:  Allergies as of 10/06/2023       Reactions   Farxiga [dapagliflozin] Other (See Comments)   Urinary incontinence   Gabapentin Other (See Comments)   Urinary incontinence   Naproxen Other (See Comments)   Fatigue, patient states it slows him down         Medication List        Accurate as of October 06, 2023  6:18 PM. If you have any questions, ask your nurse or doctor.          STOP taking these medications    tamsulosin 0.4 MG Caps capsule Commonly known as: FLOMAX   tolterodine 4 MG 24 hr capsule Commonly known as: Detrol LA       TAKE these medications    allopurinol 100 MG tablet Commonly known as: ZYLOPRIM Take 1 tablet (100 mg total) by mouth daily.   amLODipine-benazepril 5-20 MG capsule Commonly known as: LOTREL TAKE 2 CAPSULES ONCE DAILY   aspirin EC 81 MG tablet Take 81 mg by mouth 3 (three) times a week. Pt states 2-3 times weekly   FISH OIL PO Take 1 capsule by mouth daily.   glucose blood test strip 1 each by Other route as needed for other. Use contour next test strip to check sugar 2 times daily   BLOOD GLUCOSE TEST STRIPS Strp 1 each by In Vitro route in the morning and at bedtime. May substitute to any manufacturer covered by patient's insurance.    Lancets Misc. Misc 1 each by Does not apply route in the morning and at bedtime. May substitute to  any manufacturer covered by [employer].   lovastatin 40 MG tablet Commonly known as: MEVACOR Take 1 tablet (40 mg total) by mouth daily.   meloxicam 15 MG tablet Commonly known as: MOBIC Take 1 tablet (15 mg total) by mouth daily.   metFORMIN 500 MG 24 hr tablet Commonly known as: GLUCOPHAGE-XR Take 2 tablets (1,000 mg total) by mouth 2 (two) times daily.   timolol 0.5 % ophthalmic solution Commonly known as: TIMOPTIC Place 1 drop into both eyes 2 (two) times daily.   tirzepatide 7.5 MG/0.5ML Pen Commonly known as: MOUNJARO Inject 7.5 mg into the skin once a week.        Allergies:  Allergies  Allergen Reactions   Farxiga [Dapagliflozin] Other (See Comments)    Urinary incontinence   Gabapentin Other (See Comments)    Urinary incontinence   Naproxen Other (See Comments)    Fatigue, patient states it slows him down     Family History: Family History  Problem Relation Age of Onset   Cancer Father 39       lung   Emphysema Mother    Dementia Mother    Cancer Brother        melanoma    Social History:  reports that he quit smoking about 48 years ago. His smoking use included cigarettes. He has been exposed to tobacco smoke. He has never used smokeless tobacco. He reports that he does not drink alcohol and does not use drugs.   Physical Exam: BP (!) 156/81   Pulse 71   Ht 5\' 10"  (1.778 m)   Wt 178 lb (80.7 kg)   BMI 25.54 kg/m   Constitutional:  Alert and oriented, No acute distress. HEENT: [place] AT, moist mucus membranes.  Trachea midline, no masses. Neurologic: Grossly intact, no focal deficits, moving all 4 extremities. Psychiatric: Normal mood and affect.   Urinalysis Results for orders placed or performed in visit on 10/06/23  Microscopic Examination   Collection Time: 10/06/23  9:04 AM   Urine  Result Value Ref Range   WBC, UA 0-5 0 - 5  /hpf   RBC, Urine None seen 0 - 2 /hpf   Epithelial Cells (non renal) 0-10 0 - 10 /hpf   Mucus, UA Present (A) Not Estab.   Bacteria, UA None seen None seen/Few  Urinalysis, Complete   Collection Time: 10/06/23  9:04 AM  Result Value Ref Range   Specific Gravity, UA >1.030 (H) 1.005 - 1.030   pH, UA 5.0 5.0 - 7.5   Color, UA Yellow Yellow   Appearance Ur Clear Clear   Leukocytes,UA Negative Negative   Protein,UA 1+ (A) Negative/Trace   Glucose, UA Negative Negative   Ketones, UA Negative Negative   RBC, UA Trace (A) Negative   Bilirubin, UA Negative Negative   Urobilinogen, Ur 0.2 0.2 - 1.0 mg/dL   Nitrite, UA Negative Negative   Microscopic Examination See below:   Bladder Scan (Post Void Residual) in office   Collection Time: 10/06/23  9:16 AM  Result Value Ref Range   Scan Result 0 ml      Assessment & Plan:    1. Prostate cancer  - His PSA is going up and will continue to monitor on a q6 month basis.  -Will follow carefully, does not meet the criteria of recurrence at this point in time.  2. Urinary frequency  - Symptoms have improved since he ran out of Flomax. Plan to discontinue this as well as the  Vesicare and Detrol which he thinks he hasn't been taking either. If his symptoms return he will start back on Flomax and let us know.  Return in about 5 months (around 03/05/2024) for PSA, IPSS, PVR.  I have reviewed the above documentation for accuracy and completeness, and I agree with the above.   Vanna Scotland, MD   [facility] [address] [place], [place] [address] [phone]

## 2023-10-19 DIAGNOSIS — H04123 Dry eye syndrome of bilateral lacrimal glands: Secondary | ICD-10-CM | POA: Diagnosis not present

## 2023-10-19 DIAGNOSIS — E113212 Type 2 diabetes mellitus with mild nonproliferative diabetic retinopathy with macular edema, left eye: Secondary | ICD-10-CM | POA: Diagnosis not present

## 2023-10-19 DIAGNOSIS — H35033 Hypertensive retinopathy, bilateral: Secondary | ICD-10-CM | POA: Diagnosis not present

## 2023-10-19 DIAGNOSIS — E113291 Type 2 diabetes mellitus with mild nonproliferative diabetic retinopathy without macular edema, right eye: Secondary | ICD-10-CM | POA: Diagnosis not present

## 2023-10-19 DIAGNOSIS — H43813 Vitreous degeneration, bilateral: Secondary | ICD-10-CM | POA: Diagnosis not present

## 2023-10-19 DIAGNOSIS — H401132 Primary open-angle glaucoma, bilateral, moderate stage: Secondary | ICD-10-CM | POA: Diagnosis not present

## 2023-10-19 DIAGNOSIS — H34832 Tributary (branch) retinal vein occlusion, left eye, with macular edema: Secondary | ICD-10-CM | POA: Diagnosis not present

## 2023-10-29 ENCOUNTER — Ambulatory Visit: Payer: Medicare Other | Admitting: Dermatology

## 2023-12-01 DIAGNOSIS — H04123 Dry eye syndrome of bilateral lacrimal glands: Secondary | ICD-10-CM | POA: Diagnosis not present

## 2023-12-01 DIAGNOSIS — H35033 Hypertensive retinopathy, bilateral: Secondary | ICD-10-CM | POA: Diagnosis not present

## 2023-12-01 DIAGNOSIS — E113291 Type 2 diabetes mellitus with mild nonproliferative diabetic retinopathy without macular edema, right eye: Secondary | ICD-10-CM | POA: Diagnosis not present

## 2023-12-01 DIAGNOSIS — H34832 Tributary (branch) retinal vein occlusion, left eye, with macular edema: Secondary | ICD-10-CM | POA: Diagnosis not present

## 2023-12-01 DIAGNOSIS — H401132 Primary open-angle glaucoma, bilateral, moderate stage: Secondary | ICD-10-CM | POA: Diagnosis not present

## 2023-12-01 DIAGNOSIS — H43813 Vitreous degeneration, bilateral: Secondary | ICD-10-CM | POA: Diagnosis not present

## 2023-12-01 DIAGNOSIS — E113212 Type 2 diabetes mellitus with mild nonproliferative diabetic retinopathy with macular edema, left eye: Secondary | ICD-10-CM | POA: Diagnosis not present

## 2023-12-03 ENCOUNTER — Encounter: Payer: Self-pay | Admitting: Dermatology

## 2023-12-03 ENCOUNTER — Ambulatory Visit: Payer: Medicare Other | Admitting: Dermatology

## 2023-12-03 DIAGNOSIS — Z1283 Encounter for screening for malignant neoplasm of skin: Secondary | ICD-10-CM | POA: Diagnosis not present

## 2023-12-03 DIAGNOSIS — Z872 Personal history of diseases of the skin and subcutaneous tissue: Secondary | ICD-10-CM | POA: Diagnosis not present

## 2023-12-03 DIAGNOSIS — D229 Melanocytic nevi, unspecified: Secondary | ICD-10-CM

## 2023-12-03 DIAGNOSIS — L821 Other seborrheic keratosis: Secondary | ICD-10-CM

## 2023-12-03 DIAGNOSIS — L570 Actinic keratosis: Secondary | ICD-10-CM

## 2023-12-03 DIAGNOSIS — L578 Other skin changes due to chronic exposure to nonionizing radiation: Secondary | ICD-10-CM | POA: Diagnosis not present

## 2023-12-03 DIAGNOSIS — L814 Other melanin hyperpigmentation: Secondary | ICD-10-CM

## 2023-12-03 DIAGNOSIS — D1801 Hemangioma of skin and subcutaneous tissue: Secondary | ICD-10-CM | POA: Diagnosis not present

## 2023-12-03 DIAGNOSIS — W908XXA Exposure to other nonionizing radiation, initial encounter: Secondary | ICD-10-CM | POA: Diagnosis not present

## 2023-12-03 NOTE — Patient Instructions (Addendum)

## 2023-12-03 NOTE — Progress Notes (Signed)
 Follow-Up Visit   Subjective  Troy Christensen is a 73 y.o. male who presents for the following: Skin Cancer Screening and Full Body Skin Exam hx of AKs  The patient presents for Total-Body Skin Exam (TBSE) for skin cancer screening and mole check. The patient has spots, moles and lesions to be evaluated, some may be new or changing and the patient may have concern these could be cancer.  Patient accompanied by wife who contributes to history.  The following portions of the chart were reviewed this encounter and updated as appropriate: medications, allergies, medical history  Review of Systems:  No other skin or systemic complaints except as noted in HPI or Assessment and Plan.  Objective  Well appearing patient in no apparent distress; mood and affect are within normal limits.  A full examination was performed including scalp, head, eyes, ears, nose, lips, neck, chest, axillae, abdomen, back, buttocks, bilateral upper extremities, bilateral lower extremities, hands, feet, fingers, toes, fingernails, and toenails. All findings within normal limits unless otherwise noted below.   Relevant physical exam findings are noted in the Assessment and Plan.  L temple x 3, scalp x 4, forehead x 2 (9) Pink scaly macules  Assessment & Plan   SKIN CANCER SCREENING PERFORMED TODAY.  ACTINIC DAMAGE - Chronic condition, secondary to cumulative UV/sun exposure - diffuse scaly erythematous macules with underlying dyspigmentation - Recommend daily broad spectrum sunscreen SPF 30+ to sun-exposed areas, reapply every 2 hours as needed.  - Staying in the shade or wearing long sleeves, sun glasses (UVA+UVB protection) and wide brim hats (4-inch brim around the entire circumference of the hat) are also recommended for sun protection.  - Call for new or changing lesions.  LENTIGINES, SEBORRHEIC KERATOSES, HEMANGIOMAS - Benign normal skin lesions - Benign-appearing - Call for any changes  MELANOCYTIC  NEVI - Tan-brown and/or pink-flesh-colored symmetric macules and papules - Benign appearing on exam today - Observation - Call clinic for new or changing moles - Recommend daily use of broad spectrum spf 30+ sunscreen to sun-exposed areas.  AK (ACTINIC KERATOSIS) (9) L temple x 3, scalp x 4, forehead x 2 (9) Actinic keratoses are precancerous spots that appear secondary to cumulative UV radiation exposure/sun exposure over time. They are chronic with expected duration over 1 year. A portion of actinic keratoses will progress to squamous cell carcinoma of the skin. It is not possible to reliably predict which spots will progress to skin cancer and so treatment is recommended to prevent development of skin cancer.  Recommend daily broad spectrum sunscreen SPF 30+ to sun-exposed areas, reapply every 2 hours as needed.  Recommend staying in the shade or wearing long sleeves, sun glasses (UVA+UVB protection) and wide brim hats (4-inch brim around the entire circumference of the hat). Call for new or changing lesions. Destruction of lesion - L temple x 3, scalp x 4, forehead x 2 (9) Complexity: simple   Destruction method: cryotherapy   Informed consent: discussed and consent obtained   Timeout:  patient name, date of birth, surgical site, and procedure verified Lesion destroyed using liquid nitrogen: Yes   Region frozen until ice ball extended beyond lesion: Yes   Cryo cycles: 1 or 2. Outcome: patient tolerated procedure well with no complications   Post-procedure details: wound care instructions given   MULTIPLE BENIGN NEVI   LENTIGINES   ACTINIC ELASTOSIS   SEBORRHEIC KERATOSES   CHERRY ANGIOMA   Return in about 1 year (around 12/02/2024) for TBSE, Hx of  AKs.  I, Sonya Hupman, RMA, am acting as scribe for Elie Goody, MD .   Documentation: I have reviewed the above documentation for accuracy and completeness, and I agree with the above.  Elie Goody, MD

## 2023-12-17 ENCOUNTER — Encounter: Payer: Self-pay | Admitting: Family Medicine

## 2023-12-17 ENCOUNTER — Ambulatory Visit (INDEPENDENT_AMBULATORY_CARE_PROVIDER_SITE_OTHER): Payer: Self-pay | Admitting: Family Medicine

## 2023-12-17 VITALS — BP 122/72 | HR 72 | Temp 97.3°F | Wt 175.0 lb

## 2023-12-17 DIAGNOSIS — E538 Deficiency of other specified B group vitamins: Secondary | ICD-10-CM | POA: Diagnosis not present

## 2023-12-17 DIAGNOSIS — Z7985 Long-term (current) use of injectable non-insulin antidiabetic drugs: Secondary | ICD-10-CM | POA: Diagnosis not present

## 2023-12-17 DIAGNOSIS — E118 Type 2 diabetes mellitus with unspecified complications: Secondary | ICD-10-CM | POA: Diagnosis not present

## 2023-12-17 MED ORDER — CYANOCOBALAMIN 1000 MCG/ML IJ SOLN
1000.0000 ug | Freq: Once | INTRAMUSCULAR | Status: AC
Start: 2023-12-17 — End: 2023-12-17
  Administered 2023-12-17: 1000 ug via INTRAMUSCULAR

## 2023-12-17 NOTE — Progress Notes (Signed)
 BP 122/72   Pulse 72   Temp (!) 97.3 F (36.3 C) (Oral)   Wt 175 lb (79.4 kg)   SpO2 94%   BMI 25.11 kg/m    Subjective:    Patient ID: Troy Christensen, male    DOB: Jul 05, 1951, 73 y.o.   MRN: [id number]  HPI: Troy Christensen is a 73 y.o. male  Chief Complaint  Patient presents with   Diabetes   DIABETES Hypoglycemic episodes:no Polydipsia/polyuria: no Visual disturbance: no Chest pain: no Paresthesias: no Glucose Monitoring: yes  Accucheck frequency: Daily  Fasting glucose: 108, 122 Taking Insulin?: no Blood Pressure Monitoring: not checking Retinal Examination: Up to Date Foot Exam: Up to Date Diabetic Education: Completed Pneumovax: Up to Date Influenza: Up to Date Aspirin: yes  Relevant past medical, surgical, family and social history reviewed and updated as indicated. Interim medical history since our last visit reviewed. Allergies and medications reviewed and updated.  Review of Systems  Constitutional: Negative.   Respiratory: Negative.    Cardiovascular: Negative.   Musculoskeletal: Negative.   Neurological: Negative.   Psychiatric/Behavioral: Negative.      Per HPI unless specifically indicated above     Objective:    BP 122/72   Pulse 72   Temp (!) 97.3 F (36.3 C) (Oral)   Wt 175 lb (79.4 kg)   SpO2 94%   BMI 25.11 kg/m   Wt Readings from Last 3 Encounters:  12/17/23 175 lb (79.4 kg)  10/06/23 178 lb (80.7 kg)  09/15/23 180 lb 6.4 oz (81.8 kg)    Physical Exam Vitals and nursing note reviewed.  Constitutional:      General: He is not in acute distress.    Appearance: Normal appearance. He is not ill-appearing, toxic-appearing or diaphoretic.  HENT:     Head: Normocephalic and atraumatic.     Right Ear: External ear normal.     Left Ear: External ear normal.     Nose: Nose normal.     Mouth/Throat:     Mouth: Mucous membranes are moist.     Pharynx: Oropharynx is clear.  Eyes:     General: No scleral icterus.        Right eye: No discharge.        Left eye: No discharge.     Extraocular Movements: Extraocular movements intact.     Conjunctiva/sclera: Conjunctivae normal.     Pupils: Pupils are equal, round, and reactive to light.  Cardiovascular:     Rate and Rhythm: Normal rate and regular rhythm.     Pulses: Normal pulses.     Heart sounds: Normal heart sounds. No murmur heard.    No friction rub. No gallop.  Pulmonary:     Effort: Pulmonary effort is normal. No respiratory distress.     Breath sounds: Normal breath sounds. No stridor. No wheezing, rhonchi or rales.  Chest:     Chest wall: No tenderness.  Musculoskeletal:        General: Normal range of motion.     Cervical back: Normal range of motion and neck supple.  Skin:    General: Skin is warm and dry.     Capillary Refill: Capillary refill takes less than 2 seconds.     Coloration: Skin is not jaundiced or pale.     Findings: No bruising, erythema, lesion or rash.  Neurological:     General: No focal deficit present.     Mental Status: He is alert and oriented to  person, place, and time. Mental status is at baseline.  Psychiatric:        Mood and Affect: Mood normal.        Behavior: Behavior normal.        Thought Content: Thought content normal.        Judgment: Judgment normal.     Results for orders placed or performed in visit on 10/06/23  Microscopic Examination   Collection Time: 10/06/23  9:04 AM   Urine  Result Value Ref Range   WBC, UA 0-5 0 - 5 /hpf   RBC, Urine None seen 0 - 2 /hpf   Epithelial Cells (non renal) 0-10 0 - 10 /hpf   Mucus, UA Present (A) Not Estab.   Bacteria, UA None seen None seen/Few  Urinalysis, Complete   Collection Time: 10/06/23  9:04 AM  Result Value Ref Range   Specific Gravity, UA >1.030 (H) 1.005 - 1.030   pH, UA 5.0 5.0 - 7.5   Color, UA Yellow Yellow   Appearance Ur Clear Clear   Leukocytes,UA Negative Negative   Protein,UA 1+ (A) Negative/Trace   Glucose, UA Negative Negative    Ketones, UA Negative Negative   RBC, UA Trace (A) Negative   Bilirubin, UA Negative Negative   Urobilinogen, Ur 0.2 0.2 - 1.0 mg/dL   Nitrite, UA Negative Negative   Microscopic Examination See below:   Bladder Scan (Post Void Residual) in office   Collection Time: 10/06/23  9:16 AM  Result Value Ref Range   Scan Result 0 ml       Assessment & Plan:   Problem List Items Addressed This Visit       Endocrine   Controlled diabetes mellitus type 2 with complications (HCC) - Primary   Rechecking labs today. Await results. Treat as needed. Will cut down on mounjaro if sugars under good control.      Relevant Orders   Hgb A1c w/o eAG     Other   B12 deficiency   Due for B12 shot- given today.       Relevant Medications   cyanocobalamin (VITAMIN B12) injection 1,000 mcg (Start on 12/17/2023  9:15 AM)     Follow up plan: Return in about 3 months (around 03/17/2024) for with me, monthly B12s until next appointment.

## 2023-12-17 NOTE — Assessment & Plan Note (Addendum)
 Rechecking labs today. Await results. Treat as needed. Will cut down on mounjaro if sugars under good control.

## 2023-12-17 NOTE — Assessment & Plan Note (Signed)
Due for B12 shot- given today. 

## 2023-12-18 LAB — HGB A1C W/O EAG: Hgb A1c MFr Bld: 6.4 % — ABNORMAL HIGH (ref 4.8–5.6)

## 2023-12-31 ENCOUNTER — Encounter: Payer: Self-pay | Admitting: Urology

## 2024-01-19 ENCOUNTER — Telehealth: Payer: Self-pay | Admitting: Urology

## 2024-01-19 ENCOUNTER — Ambulatory Visit

## 2024-01-19 DIAGNOSIS — E538 Deficiency of other specified B group vitamins: Secondary | ICD-10-CM | POA: Diagnosis not present

## 2024-01-19 MED ORDER — TOLTERODINE TARTRATE ER 4 MG PO CP24: 4.0000 mg | ORAL_CAPSULE | Freq: Every day | ORAL | 11 refills | Status: AC

## 2024-01-19 NOTE — Telephone Encounter (Signed)
Is it ok to refill? 

## 2024-01-19 NOTE — Progress Notes (Signed)
 Patient is in office today for a nurse visit for B12 Injection. Patient Injection was given in the  Left deltoid. Patient tolerated injection well.

## 2024-01-19 NOTE — Telephone Encounter (Signed)
 Patient dropped in office this morning and stated that his pharmacy said his prescription for Tolterodine  Tart ER 4 mg capsules was denied. Chart shows it was discontinued on 10/06/23. Patient has been taking regularly and needs refill. Pharmacy is [facility] in [place].

## 2024-01-19 NOTE — Telephone Encounter (Signed)
 When I saw him in January, there are some question about what he was and was not taking.  If he thinks he needs it and wants to continue, okay to refill.  If he has been doing fine off of it, then I prefer him to stop.  Dustin Gimenez, MD

## 2024-01-19 NOTE — Telephone Encounter (Signed)
 Spoke with patient. Patient has been taking this medication since he saw Dr Ace Holder. RX refilled.

## 2024-01-25 ENCOUNTER — Inpatient Hospital Stay: Attending: Radiation Oncology

## 2024-01-25 DIAGNOSIS — C61 Malignant neoplasm of prostate: Secondary | ICD-10-CM | POA: Diagnosis not present

## 2024-01-25 LAB — PSA: Prostatic Specific Antigen: 0.73 ng/mL (ref 0.00–4.00)

## 2024-01-28 ENCOUNTER — Other Ambulatory Visit: Payer: Medicare Other

## 2024-02-04 ENCOUNTER — Ambulatory Visit
Admission: RE | Admit: 2024-02-04 | Discharge: 2024-02-04 | Disposition: A | Discharge status: Home / Self Care | Payer: Medicare Other | Source: Ambulatory Visit | Attending: Radiation Oncology | Admitting: Radiation Oncology

## 2024-02-04 ENCOUNTER — Encounter: Payer: Self-pay | Admitting: Radiation Oncology

## 2024-02-04 VITALS — BP 137/76 | HR 64 | Temp 96.8°F | Resp 16 | Ht 70.0 in | Wt 181.8 lb

## 2024-02-04 DIAGNOSIS — Z923 Personal history of irradiation: Secondary | ICD-10-CM | POA: Diagnosis not present

## 2024-02-04 DIAGNOSIS — Z191 Hormone sensitive malignancy status: Secondary | ICD-10-CM | POA: Diagnosis not present

## 2024-02-04 DIAGNOSIS — C61 Malignant neoplasm of prostate: Secondary | ICD-10-CM | POA: Diagnosis not present

## 2024-02-04 NOTE — Progress Notes (Signed)
 Radiation Oncology Follow up Note  Name: Troy Christensen   Date:   02/04/2024 MRN:  [id number] DOB: 04-Sep-1951    This 73 y.o. 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  REFERRING PROVIDER: Solomon Dupre, DO  HPI: Patient is a 73 year old male now out over 3 years having completed salvage radiation therapy status post prostatectomy.  He is now seen 3 years out is doing well.  States he does have some urgency and occasional incontinence.  He is on Flomax .  He otherwise specifically denies any diarrhea or overall fatigue.  His most recent PSA.  His 0.7 slightly up from 0.46 months ago.  He has been followed by urology and has had multiple courses of oxybutynin  as well as Gemtesa  and he continues on Flomax .  COMPLICATIONS OF TREATMENT: none  FOLLOW UP COMPLIANCE: keeps appointments   PHYSICAL EXAM:  BP 137/76   Pulse 64   Temp (!) 96.8 F (36 C) (Tympanic)   Resp 16   Ht 5\' 10"  (1.778 m)   Wt 181 lb 12.8 oz (82.5 kg)   BMI 26.09 kg/m  Well-developed well-nourished patient in NAD. HEENT reveals PERLA, EOMI, discs not visualized.  Oral cavity is clear. No oral mucosal lesions are identified. Neck is clear without evidence of cervical or supraclavicular adenopathy. Lungs are clear to A&P. Cardiac examination is essentially unremarkable with regular rate and rhythm without murmur rub or thrill. Abdomen is benign with no organomegaly or masses noted. Motor sensory and DTR levels are equal and symmetric in the upper and lower extremities. Cranial nerves II through XII are grossly intact. Proprioception is intact. No peripheral adenopathy or edema is identified. No motor or sensory levels are noted. Crude visual fields are within normal range.  RADIOLOGY RESULTS: No current films for review  PLAN: Present time I am going  to turn follow-up care over to urology.  He does have a small slight incremental increase in his PSA which we must keep an eye on.  I would be happy to reevaluate the patient anytime should that be indicated.  Patient knows to call with any concerns.  I would like to take this opportunity to thank you for allowing me to participate in the care of your patient.Glenis Langdon, MD

## 2024-02-09 DIAGNOSIS — H04123 Dry eye syndrome of bilateral lacrimal glands: Secondary | ICD-10-CM | POA: Diagnosis not present

## 2024-02-09 DIAGNOSIS — H35033 Hypertensive retinopathy, bilateral: Secondary | ICD-10-CM | POA: Diagnosis not present

## 2024-02-09 DIAGNOSIS — H31002 Unspecified chorioretinal scars, left eye: Secondary | ICD-10-CM | POA: Diagnosis not present

## 2024-02-09 DIAGNOSIS — E113291 Type 2 diabetes mellitus with mild nonproliferative diabetic retinopathy without macular edema, right eye: Secondary | ICD-10-CM | POA: Diagnosis not present

## 2024-02-09 DIAGNOSIS — H43813 Vitreous degeneration, bilateral: Secondary | ICD-10-CM | POA: Diagnosis not present

## 2024-02-09 DIAGNOSIS — H34832 Tributary (branch) retinal vein occlusion, left eye, with macular edema: Secondary | ICD-10-CM | POA: Diagnosis not present

## 2024-02-09 DIAGNOSIS — E113212 Type 2 diabetes mellitus with mild nonproliferative diabetic retinopathy with macular edema, left eye: Secondary | ICD-10-CM | POA: Diagnosis not present

## 2024-02-09 DIAGNOSIS — H401132 Primary open-angle glaucoma, bilateral, moderate stage: Secondary | ICD-10-CM | POA: Diagnosis not present

## 2024-02-09 LAB — HM DIABETES EYE EXAM

## 2024-02-16 ENCOUNTER — Ambulatory Visit (INDEPENDENT_AMBULATORY_CARE_PROVIDER_SITE_OTHER)

## 2024-02-16 DIAGNOSIS — E538 Deficiency of other specified B group vitamins: Secondary | ICD-10-CM

## 2024-02-16 NOTE — Progress Notes (Signed)
 Patient is in office today for a nurse visit for B12 Injection. Patient Injection was given in the  Left deltoid. Patient tolerated injection well.

## 2024-02-28 ENCOUNTER — Other Ambulatory Visit: Payer: Self-pay | Admitting: Family Medicine

## 2024-02-28 DIAGNOSIS — M1A1790 Lead-induced chronic gout, unspecified ankle and foot, without tophus (tophi): Secondary | ICD-10-CM

## 2024-03-01 ENCOUNTER — Ambulatory Visit (INDEPENDENT_AMBULATORY_CARE_PROVIDER_SITE_OTHER): Admitting: Urology

## 2024-03-01 VITALS — BP 128/72 | HR 80 | Ht 70.0 in | Wt 179.2 lb

## 2024-03-01 DIAGNOSIS — Z9889 Other specified postprocedural states: Secondary | ICD-10-CM | POA: Insufficient documentation

## 2024-03-01 DIAGNOSIS — R3915 Urgency of urination: Secondary | ICD-10-CM

## 2024-03-01 DIAGNOSIS — R350 Frequency of micturition: Secondary | ICD-10-CM

## 2024-03-01 DIAGNOSIS — Z87438 Personal history of other diseases of male genital organs: Secondary | ICD-10-CM | POA: Insufficient documentation

## 2024-03-01 DIAGNOSIS — M545 Low back pain, unspecified: Secondary | ICD-10-CM | POA: Insufficient documentation

## 2024-03-01 DIAGNOSIS — C61 Malignant neoplasm of prostate: Secondary | ICD-10-CM | POA: Diagnosis not present

## 2024-03-01 LAB — BLADDER SCAN AMB NON-IMAGING: Scan Result: 32

## 2024-03-01 NOTE — Telephone Encounter (Signed)
 Requested medications are due for refill today.  yes  Requested medications are on the active medications list.  yes  Last refill. 09/15/2023 #90 0 rf  Future visit scheduled.   yes  Notes to clinic.  Uric acid lab is expired.    Requested Prescriptions  Pending Prescriptions Disp Refills   allopurinol  (ZYLOPRIM ) 100 MG tablet [Pharmacy Med Name: ALLOPURINOL  TABS 100MG ] 90 tablet 3    Sig: TAKE 1 TABLET DAILY     Endocrinology:  Gout Agents - allopurinol  Failed - 03/01/2024  4:45 PM      Failed - Uric Acid in normal range and within 360 days    Uric Acid  Date Value Ref Range Status  09/04/2022 4.7 3.8 - 8.4 mg/dL Final    Comment:               Therapeutic target for gout patients: <6.0         Passed - Cr in normal range and within 360 days    Creatinine, Ser  Date Value Ref Range Status  09/15/2023 1.10 0.76 - 1.27 mg/dL Final         Passed - Valid encounter within last 12 months    Recent Outpatient Visits           2 months ago Controlled type 2 diabetes mellitus with complication, without long-term current use of insulin (HCC)   Gutierrez [facility] [place], [place], DO       Future Appointments             In 6 months Vaillancourt, Ova Bloomer, PA-C [facility]   In 9 months Elta Halter, MD [facility]            Passed - CBC within normal limits and completed in the last 12 months    WBC  Date Value Ref Range Status  09/15/2023 3.3 (L) 3.4 - 10.8 x10E3/uL Final  09/22/2022 3.6 (L) 4.0 - 10.5 K/uL Final   RBC  Date Value Ref Range Status  09/15/2023 3.73 (L) 4.14 - 5.80 x10E6/uL Final  09/22/2022 4.59 4.22 - 5.81 MIL/uL Final   Hemoglobin  Date Value Ref Range Status  09/15/2023 12.5 (L) 13.0 - 17.7 g/dL Final   Hematocrit  Date Value Ref Range Status  09/15/2023 37.0 (L) 37.5 - 51.0 % Final   MCHC  Date Value Ref Range Status  09/15/2023 33.8 31.5 - 35.7 g/dL Final  95/18/8416  60.6 30.0 - 36.0 g/dL Final   [facility]  Date Value Ref Range Status  09/15/2023 33.5 (H) 26.6 - 33.0 pg Final  09/22/2022 32.7 26.0 - 34.0 pg Final   MCV  Date Value Ref Range Status  09/15/2023 99 (H) 79 - 97 fL Final   No results found for: PLTCOUNTKUC, LABPLAT, POCPLA RDW  Date Value Ref Range Status  09/15/2023 13.0 11.6 - 15.4 % Final

## 2024-03-01 NOTE — Progress Notes (Signed)
 I,Amy L Pierron,acting as a scribe for Troy Gimenez, MD.,have documented all relevant documentation on the behalf of Troy Gimenez, MD,as directed by  Troy Gimenez, MD while in the presence of Troy Gimenez, MD.  03/01/2024 2:26 PM   Armstead Landsberg [birth year] [id number]  Referring provider: Solomon Dupre, DO [address] [place],  [place] [address]  Chief Complaint  Patient presents with   Urinary Frequency    HPI: 73 year-old male with a personal history of urge incontinence and prostate cancer presents today for a six month routine follow up.  He is status post IMRT with salvage brachytherapy in 2022.   He has tried and failed Oxybutynin . He was given samples of Gemtasa for six weeks and advised to continue Flomax . He was also on Farxiga  which likely was exacerbating his symptoms. But he was switched to Monjaro. He did think Gemtessa was helping, but not cost effective so he was changed to Trospium , however he was no longer responding to this either. At that point in time he was prescribed Detrol  LA. He is not currently on Vesicare or Flomax .  His most recent PSA on 01/25/2024 has risen to 0.73, which is concerning but does not meet the criteria for recurrence.    His primary complaint is urgency, and he gets up twice at night. He is mostly satisfied with his urine management. He reports that his symptoms have not worsened or improved since discontinuing Flomax . He has been taking the Detrol .  He drinks one to two cups of coffee a day and sometimes a Pepsi at lunch.   IPSS     Row Name 03/01/24 0900         International Prostate Symptom Score   How often have you had the sensation of not emptying your bladder? Less than half the time     How often have you had to urinate less than every two hours? Less than half the time     How often have you found you stopped and started again several times when you urinated? Not at All     How often have you found it difficult to  postpone urination? About half the time     How often have you had a weak urinary stream? Less than half the time     How often have you had to strain to start urination? Less than 1 in 5 times     How many times did you typically get up at night to urinate? 2 Times     Total IPSS Score 12       Quality of Life due to urinary symptoms   If you were to spend the rest of your life with your urinary condition just the way it is now how would you feel about that? Mostly Satisfied      Score:  1-7 Mild 8-19 Moderate 20-35 Severe Results for orders placed or performed in visit on 03/01/24  Bladder Scan (Post Void Residual) in office  Result Value Ref Range   Scan Result 32 ml     PMH: Past Medical History:  Diagnosis Date   Actinic keratosis    Cancer (HCC)    Prostate   Diabetes mellitus without complication (HCC)    Glaucoma    Gout    Hypercholesteremia    Hypertension    Osteoarthritis    hands   Prostate cancer [facility])     Surgical History: Past Surgical History:  Procedure Laterality Date  EYE SURGERY Bilateral    march and april 2021- [place]    FLEXIBLE SIGMOIDOSCOPY N/A 01/10/2016   Procedure: FLEXIBLE SIGMOIDOSCOPY with  argon plasma coagulation;  Surgeon: Marnee Sink, MD;  Location: [facility];  Service: Endoscopy;  Laterality: N/A;  Diabetic - oral meds   HERNIA REPAIR     JOINT REPLACEMENT  2014   Right Knee Replacement   MENISCUS REPAIR Left    nephrolithiasis     pt denies   PROSTATE SURGERY     Radiation treatments - no surgery   RADIOACTIVE SEED IMPLANT N/A 03/25/2021   Procedure: RADIOACTIVE SEED IMPLANT/BRACHYTHERAPY IMPLANT;  Surgeon: Troy Gimenez, MD;  Location: [facility];  Service: Urology;  Laterality: N/A;   RETINAL DETACHMENT SURGERY Bilateral    One surgery in March 2021, and Second in April 2021   ROTATOR CUFF REPAIR Right 2012    Home Medications:  Allergies as of 03/01/2024       Reactions   Farxiga  [dapagliflozin ] Other  (See Comments)   Urinary incontinence   Gabapentin  Other (See Comments)   Urinary incontinence   Naproxen  Other (See Comments)   Fatigue, patient states it slows him down         Medication List        Accurate as of March 01, 2024  2:26 PM. If you have any questions, ask your nurse or doctor.          STOP taking these medications    meloxicam  15 MG tablet Commonly known as: MOBIC        TAKE these medications    allopurinol  100 MG tablet Commonly known as: ZYLOPRIM  Take 1 tablet (100 mg total) by mouth daily.   amLODipine -benazepril  5-20 MG capsule Commonly known as: LOTREL TAKE 2 CAPSULES ONCE DAILY   aspirin EC 81 MG tablet Take 81 mg by mouth 3 (three) times a week. Pt states 2-3 times weekly   FISH OIL PO Take 1 capsule by mouth daily.   glucose blood test strip 1 each by Other route as needed for other. Use contour next test strip to check sugar 2 times daily   BLOOD GLUCOSE TEST STRIPS Strp 1 each by In Vitro route in the morning and at bedtime. May substitute to any manufacturer covered by patient's insurance.   Lancets Misc. Misc 1 each by Does not apply route in the morning and at bedtime. May substitute to any manufacturer covered by patient's insurance.   lovastatin  40 MG tablet Commonly known as: MEVACOR  Take 1 tablet (40 mg total) by mouth daily.   metFORMIN  500 MG 24 hr tablet Commonly known as: GLUCOPHAGE -XR Take 2 tablets (1,000 mg total) by mouth 2 (two) times daily.   timolol 0.5 % ophthalmic solution Commonly known as: TIMOPTIC Place 1 drop into both eyes 2 (two) times daily.   tirzepatide  7.5 MG/0.5ML Pen Commonly known as: MOUNJARO  Inject 7.5 mg into the skin once a week.   tolterodine  4 MG 24 hr capsule Commonly known as: Detrol  LA Take 1 capsule (4 mg total) by mouth daily.        Allergies:  Allergies  Allergen Reactions   Farxiga  [Dapagliflozin ] Other (See Comments)    Urinary incontinence   Gabapentin  Other  (See Comments)    Urinary incontinence   Naproxen  Other (See Comments)    Fatigue, patient states it slows him down     Family History: Family History  Problem Relation Age of Onset   Cancer Father 15  lung   Emphysema Mother    Dementia Mother    Cancer Brother        melanoma    Social History:  reports that he quit smoking about 48 years ago. His smoking use included cigarettes. He has been exposed to tobacco smoke. He has never used smokeless tobacco. He reports that he does not drink alcohol and does not use drugs.   Physical Exam: BP 128/72   Pulse 80   Ht 5' 10 (1.778 m)   Wt 179 lb 4 oz (81.3 kg)   BMI 25.72 kg/m   Constitutional:  Alert and oriented, No acute distress. HEENT: [place] AT, moist mucus membranes.  Trachea midline, no masses. Neurologic: Grossly intact, no focal deficits, moving all 4 extremities. Psychiatric: Normal mood and affect.   Assessment & Plan:    1. Prostate Cancer - PSA is 0.73, which is rising but does not meet recurrence criteria. Plan to recheck PSA in six months to monitor for any further increase.  2. Urge Incontinence - Patient reports urgency and nocturia, getting up twice at night. He is not currently taking Flomax . Continue current management with Detrol . Advised to avoid bladder irritants such as coffee and Pepsi.  Return in about 6 months (around 08/31/2024) for PSA.  I have reviewed the above documentation for accuracy and completeness, and I agree with the above.   Troy Gimenez, MD   [facility] [address] [place], [place] [address] [phone]

## 2024-03-11 ENCOUNTER — Other Ambulatory Visit: Payer: Self-pay

## 2024-03-15 ENCOUNTER — Ambulatory Visit: Admitting: Physician Assistant

## 2024-03-15 ENCOUNTER — Ambulatory Visit: Payer: Self-pay | Admitting: Urology

## 2024-03-16 ENCOUNTER — Other Ambulatory Visit: Payer: Self-pay | Admitting: *Deleted

## 2024-03-16 MED ORDER — TAMSULOSIN HCL 0.4 MG PO CAPS
0.4000 mg | ORAL_CAPSULE | Freq: Every day | ORAL | 2 refills | Status: DC
Start: 2024-03-16 — End: 2024-07-14

## 2024-03-16 NOTE — Telephone Encounter (Signed)
 Incoming triage call from [facility] to request a Rf for patient's flomax . Would Dr. JAYSON approve? Script pended.

## 2024-03-17 ENCOUNTER — Ambulatory Visit

## 2024-03-24 ENCOUNTER — Ambulatory Visit

## 2024-03-24 ENCOUNTER — Other Ambulatory Visit: Payer: Self-pay

## 2024-03-24 ENCOUNTER — Telehealth: Payer: Self-pay | Admitting: Family Medicine

## 2024-03-24 ENCOUNTER — Ambulatory Visit: Admitting: Family Medicine

## 2024-03-24 DIAGNOSIS — E538 Deficiency of other specified B group vitamins: Secondary | ICD-10-CM | POA: Diagnosis not present

## 2024-03-24 MED ORDER — TIRZEPATIDE 7.5 MG/0.5ML ~~LOC~~ SOAJ: 7.5000 mg | SUBCUTANEOUS | 0 refills | Status: DC

## 2024-03-24 NOTE — Telephone Encounter (Signed)
 Prescription sent in today.

## 2024-03-24 NOTE — Progress Notes (Signed)
 Patient is in office today for a nurse visit for B12 Injection. Patient Injection was given in the  Left deltoid. Patient tolerated injection well.

## 2024-03-24 NOTE — Telephone Encounter (Signed)
 Patient stated that he will need another RX for  Mounjaro  7.5 before his next appt with provider. On 7/15. Please advise.

## 2024-03-25 ENCOUNTER — Other Ambulatory Visit: Payer: Self-pay | Admitting: Family Medicine

## 2024-03-25 ENCOUNTER — Other Ambulatory Visit: Payer: Self-pay

## 2024-03-25 NOTE — Telephone Encounter (Signed)
 Copied from CRM [id number]. Topic: Clinical - Prescription Issue >> Mar 25, 2024  1:33 PM Carlatta H wrote: Reason for CRM: Patient called and stated he needs his tirzepatide  (MOUNJARO ) 7.5 MG/0.5ML Pen prescription by this weekend//Currently pending

## 2024-03-28 ENCOUNTER — Ambulatory Visit: Admitting: Family Medicine

## 2024-03-28 NOTE — Telephone Encounter (Signed)
 Rx sent to pharmacy

## 2024-03-28 NOTE — Telephone Encounter (Signed)
 Requested medication (s) are due for refill today - no  Requested medication (s) are on the active medication list -yes  Future visit scheduled -yes  Last refill: 03/24/24 2ml  Notes to clinic: off protocol- provider review- [facility]  Requested Prescriptions  Pending Prescriptions Disp Refills   MOUNJARO  7.5 MG/0.5ML Pen [Pharmacy Med Name: MOUNJARO  7.5 MG/0.5 ML PEN]  5    Sig: INJECT 7.5 MG SUBCUTANEOUSLY WEEKLY     Off-Protocol Failed - 03/28/2024  2:03 PM      Failed - Medication not assigned to a protocol, review manually.      Passed - Valid encounter within last 12 months    Recent Outpatient Visits           3 months ago Controlled type 2 diabetes mellitus with complication, without long-term current use of insulin (HCC)   [facility] [place], Duwaine SQUIBB, DO       Future Appointments             In 5 months Vaillancourt, Samantha, PA-C [facility] [place]   In 8 months Hester Alm BROCKS, MD [place] [facility]               Requested Prescriptions  Pending Prescriptions Disp Refills   MOUNJARO  7.5 MG/0.5ML Pen [Pharmacy Med Name: MOUNJARO  7.5 MG/0.5 ML PEN]  5    Sig: INJECT 7.5 MG SUBCUTANEOUSLY WEEKLY     Off-Protocol Failed - 03/28/2024  2:03 PM      Failed - Medication not assigned to a protocol, review manually.      Passed - Valid encounter within last 12 months    Recent Outpatient Visits           3 months ago Controlled type 2 diabetes mellitus with complication, without long-term current use of insulin (HCC)   [place] [facility] [place], Duwaine SQUIBB, DO       Future Appointments             In 5 months Vaillancourt, Samantha, PA-C [facility] [place]   In 8 months Hester Alm BROCKS, MD [facility]

## 2024-03-29 ENCOUNTER — Encounter: Payer: Self-pay | Admitting: Family Medicine

## 2024-03-29 ENCOUNTER — Telehealth: Payer: Self-pay

## 2024-03-29 ENCOUNTER — Ambulatory Visit: Admitting: Family Medicine

## 2024-03-29 VITALS — BP 122/63 | HR 63 | Temp 98.0°F | Resp 15 | Ht 70.0 in | Wt 178.2 lb

## 2024-03-29 DIAGNOSIS — I10 Essential (primary) hypertension: Secondary | ICD-10-CM

## 2024-03-29 DIAGNOSIS — M1A1790 Lead-induced chronic gout, unspecified ankle and foot, without tophus (tophi): Secondary | ICD-10-CM

## 2024-03-29 DIAGNOSIS — Z7985 Long-term (current) use of injectable non-insulin antidiabetic drugs: Secondary | ICD-10-CM | POA: Diagnosis not present

## 2024-03-29 DIAGNOSIS — E785 Hyperlipidemia, unspecified: Secondary | ICD-10-CM

## 2024-03-29 DIAGNOSIS — E538 Deficiency of other specified B group vitamins: Secondary | ICD-10-CM | POA: Diagnosis not present

## 2024-03-29 DIAGNOSIS — E118 Type 2 diabetes mellitus with unspecified complications: Secondary | ICD-10-CM

## 2024-03-29 DIAGNOSIS — T560X1D Toxic effect of lead and its compounds, accidental (unintentional), subsequent encounter: Secondary | ICD-10-CM

## 2024-03-29 LAB — BAYER DCA HB A1C WAIVED: HB A1C (BAYER DCA - WAIVED): 6.6 % — ABNORMAL HIGH (ref 4.8–5.6)

## 2024-03-29 MED ORDER — METFORMIN HCL ER 500 MG PO TB24: 1000.0000 mg | ORAL_TABLET | Freq: Two times a day (BID) | ORAL | 1 refills | Status: DC

## 2024-03-29 MED ORDER — TIRZEPATIDE 7.5 MG/0.5ML ~~LOC~~ SOAJ: 7.5000 mg | SUBCUTANEOUS | 1 refills | Status: DC

## 2024-03-29 MED ORDER — AMLODIPINE BESY-BENAZEPRIL HCL 5-20 MG PO CAPS: ORAL_CAPSULE | ORAL | 1 refills | Status: DC

## 2024-03-29 MED ORDER — ALLOPURINOL 100 MG PO TABS: 100.0000 mg | ORAL_TABLET | Freq: Every day | ORAL | 1 refills | Status: DC

## 2024-03-29 MED ORDER — LOVASTATIN 40 MG PO TABS: 40.0000 mg | ORAL_TABLET | Freq: Every day | ORAL | 1 refills | Status: DC

## 2024-03-29 NOTE — Telephone Encounter (Signed)
 Spoke with patient and let him know that I reached out to [facility] to inquire about the prescription for Mounjaro . They stated it is already in process and can not be cancelled but that it should be delivered to him Friday 04/01/2024.

## 2024-03-29 NOTE — Assessment & Plan Note (Signed)
 Under good control on current regimen. Continue current regimen. Continue to monitor. Call with any concerns. Refills given. Labs drawn today.

## 2024-03-29 NOTE — Assessment & Plan Note (Signed)
 No flares. Tolerating his medicine well.

## 2024-03-29 NOTE — Assessment & Plan Note (Signed)
Stable with A1c of 6.6. Continue current regimen. Continue to monitor. Call with any concerns.

## 2024-03-29 NOTE — Assessment & Plan Note (Signed)
 Rechecking labs today. Await results. Treat as needed.

## 2024-03-29 NOTE — Progress Notes (Signed)
 BP 122/63 (BP Location: Left Arm, Patient Position: Sitting, Cuff Size: Normal)   Pulse 63   Temp 98 F (36.7 C) (Oral)   Resp 15   Ht 5' 10 (1.778 m)   Wt 178 lb 3.2 oz (80.8 kg)   SpO2 98%   BMI 25.57 kg/m    Subjective:    Patient ID: Troy Christensen, male    DOB: 08-31-1951, 73 y.o.   MRN: [id number]  HPI: Troy Christensen is a 73 y.o. male  Chief Complaint  Patient presents with   Diabetes    Ranges at home 105-112 fasting am glucose. Pending refill on Mounjaro  due to error in where prescription was sent last week. Eye exam being requested to fill HM gap.    Hypertension    Dos not check at home but doesn't feel he has any concerns.    DIABETES Hypoglycemic episodes:no Polydipsia/polyuria: no Visual disturbance: no Chest pain: no Paresthesias: no Glucose Monitoring: yes  Accucheck frequency: Daily Taking Insulin?: no Blood Pressure Monitoring: not checking Retinal Examination: Up to Date Foot Exam: Up to Date Diabetic Education: Completed Pneumovax: Up to Date Influenza: Up to Date Aspirin: yes  HYPERTENSION / HYPERLIPIDEMIA Satisfied with current treatment? yes Duration of hypertension: chronic BP medication side effects: no Past BP meds: benazepril , amlodipine ,  Duration of hyperlipidemia: chronic Cholesterol medication side effects: no Cholesterol supplements: none Past cholesterol medications: pravastatin  Medication compliance: excellent compliance Aspirin: yes Recent stressors: no Recurrent headaches: no Visual changes: no Palpitations: no Dyspnea: no Chest pain: no Lower extremity edema: no Dizzy/lightheaded: no   Relevant past medical, surgical, family and social history reviewed and updated as indicated. Interim medical history since our last visit reviewed. Allergies and medications reviewed and updated.  Review of Systems  Constitutional: Negative.   Respiratory: Negative.    Cardiovascular: Negative.   Musculoskeletal:  Negative.   Psychiatric/Behavioral: Negative.      Per HPI unless specifically indicated above     Objective:    BP 122/63 (BP Location: Left Arm, Patient Position: Sitting, Cuff Size: Normal)   Pulse 63   Temp 98 F (36.7 C) (Oral)   Resp 15   Ht 5' 10 (1.778 m)   Wt 178 lb 3.2 oz (80.8 kg)   SpO2 98%   BMI 25.57 kg/m   Wt Readings from Last 3 Encounters:  03/29/24 178 lb 3.2 oz (80.8 kg)  03/01/24 179 lb 4 oz (81.3 kg)  02/04/24 181 lb 12.8 oz (82.5 kg)    Physical Exam Vitals and nursing note reviewed.  Constitutional:      General: He is not in acute distress.    Appearance: Normal appearance. He is not ill-appearing, toxic-appearing or diaphoretic.  HENT:     Head: Normocephalic and atraumatic.     Right Ear: External ear normal.     Left Ear: External ear normal.     Nose: Nose normal.     Mouth/Throat:     Mouth: Mucous membranes are moist.     Pharynx: Oropharynx is clear.  Eyes:     General: No scleral icterus.       Right eye: No discharge.        Left eye: No discharge.     Extraocular Movements: Extraocular movements intact.     Conjunctiva/sclera: Conjunctivae normal.     Pupils: Pupils are equal, round, and reactive to light.  Cardiovascular:     Rate and Rhythm: Normal rate and regular rhythm.  Pulses: Normal pulses.     Heart sounds: Normal heart sounds. No murmur heard.    No friction rub. No gallop.  Pulmonary:     Effort: Pulmonary effort is normal. No respiratory distress.     Breath sounds: Normal breath sounds. No stridor. No wheezing, rhonchi or rales.  Chest:     Chest wall: No tenderness.  Musculoskeletal:        General: Normal range of motion.     Cervical back: Normal range of motion and neck supple.  Skin:    General: Skin is warm and dry.     Capillary Refill: Capillary refill takes less than 2 seconds.     Coloration: Skin is not jaundiced or pale.     Findings: No bruising, erythema, lesion or rash.  Neurological:      General: No focal deficit present.     Mental Status: He is alert and oriented to person, place, and time. Mental status is at baseline.  Psychiatric:        Mood and Affect: Mood normal.        Behavior: Behavior normal.        Thought Content: Thought content normal.        Judgment: Judgment normal.     Results for orders placed or performed in visit on 03/01/24  Bladder Scan (Post Void Residual) in office   Collection Time: 03/01/24  9:50 AM  Result Value Ref Range   Scan Result 32 ml       Assessment & Plan:   Problem List Items Addressed This Visit       Cardiovascular and Mediastinum   Hypertension   Under good control on current regimen. Continue current regimen. Continue to monitor. Call with any concerns. Refills given. Labs drawn today.        Relevant Medications   amLODipine -benazepril  (LOTREL) 5-20 MG capsule   lovastatin  (MEVACOR ) 40 MG tablet     Endocrine   Controlled diabetes mellitus type 2 with complications (HCC) - Primary   Stable with A1c of 6.6. Continue current regimen. Continue to monitor. Call with any concerns.       Relevant Medications   tirzepatide  (MOUNJARO ) 7.5 MG/0.5ML Pen   amLODipine -benazepril  (LOTREL) 5-20 MG capsule   lovastatin  (MEVACOR ) 40 MG tablet   metFORMIN  (GLUCOPHAGE -XR) 500 MG 24 hr tablet   Other Relevant Orders   Bayer DCA Hb A1c Waived   Comprehensive metabolic panel with GFR   Lipid Panel w/o Chol/HDL Ratio   CBC with Differential/Platelet     Other   Hyperlipidemia   Under good control on current regimen. Continue current regimen. Continue to monitor. Call with any concerns. Refills given. Labs drawn today.       Relevant Medications   amLODipine -benazepril  (LOTREL) 5-20 MG capsule   lovastatin  (MEVACOR ) 40 MG tablet   Gout   No flares. Tolerating his medicine well.       Relevant Medications   allopurinol  (ZYLOPRIM ) 100 MG tablet   B12 deficiency   Rechecking labs today. Await results. Treat as  needed.       Relevant Orders   B12     Follow up plan: Return in about 3 months (around 06/29/2024).

## 2024-03-30 LAB — LIPID PANEL W/O CHOL/HDL RATIO

## 2024-03-31 ENCOUNTER — Encounter: Payer: Self-pay | Admitting: Family Medicine

## 2024-03-31 LAB — CBC WITH DIFFERENTIAL/PLATELET
Basophils Absolute: 0 x10E3/uL (ref 0.0–0.2)
Basos: 1 %
EOS (ABSOLUTE): 0.3 x10E3/uL (ref 0.0–0.4)
Eos: 7 %
Hematocrit: 39.3 % (ref 37.5–51.0)
Hemoglobin: 12.8 g/dL — ABNORMAL LOW (ref 13.0–17.7)
Immature Grans (Abs): 0 x10E3/uL (ref 0.0–0.1)
Immature Granulocytes: 0 %
Lymphocytes Absolute: 1.3 x10E3/uL (ref 0.7–3.1)
Lymphs: 33 %
MCH: 32.7 pg (ref 26.6–33.0)
MCHC: 32.6 g/dL (ref 31.5–35.7)
MCV: 100 fL — ABNORMAL HIGH (ref 79–97)
Monocytes Absolute: 0.3 x10E3/uL (ref 0.1–0.9)
Monocytes: 9 %
Neutrophils Absolute: 2 x10E3/uL (ref 1.4–7.0)
Neutrophils: 50 %
Platelets: 179 x10E3/uL (ref 150–450)
RBC: 3.92 x10E6/uL — ABNORMAL LOW (ref 4.14–5.80)
RDW: 13.4 % (ref 11.6–15.4)
WBC: 4 x10E3/uL (ref 3.4–10.8)

## 2024-03-31 LAB — COMPREHENSIVE METABOLIC PANEL WITH GFR
ALT: 15 IU/L (ref 0–44)
AST: 18 IU/L (ref 0–40)
Albumin: 4.5 g/dL (ref 3.8–4.8)
Alkaline Phosphatase: 60 IU/L (ref 44–121)
BUN/Creatinine Ratio: 13 (ref 10–24)
BUN: 15 mg/dL (ref 8–27)
Bilirubin Total: 0.3 mg/dL (ref 0.0–1.2)
CO2: 19 mmol/L — AB (ref 20–29)
Calcium: 10 mg/dL (ref 8.6–10.2)
Chloride: 104 mmol/L (ref 96–106)
Creatinine, Ser: 1.19 mg/dL (ref 0.76–1.27)
Globulin, Total: 2 g/dL (ref 1.5–4.5)
Glucose: 116 mg/dL — AB (ref 70–99)
Potassium: 5.1 mmol/L (ref 3.5–5.2)
Sodium: 142 mmol/L (ref 134–144)
Total Protein: 6.5 g/dL (ref 6.0–8.5)
eGFR: 65 mL/min/1.73 (ref >59)

## 2024-03-31 LAB — LIPID PANEL W/O CHOL/HDL RATIO
Cholesterol, Total: 138 mg/dL (ref 100–199)
HDL: 44 mg/dL (ref >39)
LDL Chol Calc (NIH): 67 mg/dL (ref 0–99)
Triglycerides: 157 mg/dL — AB (ref 0–149)
VLDL Cholesterol Cal: 27 mg/dL (ref 5–40)

## 2024-03-31 LAB — VITAMIN B12: Vitamin B-12: 574 pg/mL (ref 232–1245)

## 2024-04-06 ENCOUNTER — Ambulatory Visit: Payer: Self-pay | Admitting: Family Medicine

## 2024-04-19 ENCOUNTER — Ambulatory Visit

## 2024-04-19 DIAGNOSIS — E538 Deficiency of other specified B group vitamins: Secondary | ICD-10-CM

## 2024-04-19 MED ORDER — CYANOCOBALAMIN 1000 MCG/ML IJ SOLN
1000.0000 ug | Freq: Once | INTRAMUSCULAR | Status: AC
  Administered 2024-04-19: 1000 ug via INTRAMUSCULAR

## 2024-04-19 NOTE — Progress Notes (Signed)
 Patient is in office today for a nurse visit for B12 Injection. Patient Injection was given in the  Left deltoid. Patient tolerated injection well.

## 2024-04-20 ENCOUNTER — Other Ambulatory Visit: Payer: Self-pay

## 2024-04-21 MED ORDER — TIRZEPATIDE 7.5 MG/0.5ML ~~LOC~~ SOAJ: 7.5000 mg | SUBCUTANEOUS | 1 refills | Status: DC

## 2024-04-26 DIAGNOSIS — H401132 Primary open-angle glaucoma, bilateral, moderate stage: Secondary | ICD-10-CM | POA: Diagnosis not present

## 2024-04-26 DIAGNOSIS — H35033 Hypertensive retinopathy, bilateral: Secondary | ICD-10-CM | POA: Diagnosis not present

## 2024-04-26 DIAGNOSIS — H34832 Tributary (branch) retinal vein occlusion, left eye, with macular edema: Secondary | ICD-10-CM | POA: Diagnosis not present

## 2024-04-26 DIAGNOSIS — H31092 Other chorioretinal scars, left eye: Secondary | ICD-10-CM | POA: Diagnosis not present

## 2024-04-26 DIAGNOSIS — E113212 Type 2 diabetes mellitus with mild nonproliferative diabetic retinopathy with macular edema, left eye: Secondary | ICD-10-CM | POA: Diagnosis not present

## 2024-04-26 DIAGNOSIS — E113291 Type 2 diabetes mellitus with mild nonproliferative diabetic retinopathy without macular edema, right eye: Secondary | ICD-10-CM | POA: Diagnosis not present

## 2024-04-26 DIAGNOSIS — H43813 Vitreous degeneration, bilateral: Secondary | ICD-10-CM | POA: Diagnosis not present

## 2024-04-26 DIAGNOSIS — H04123 Dry eye syndrome of bilateral lacrimal glands: Secondary | ICD-10-CM | POA: Diagnosis not present

## 2024-05-13 ENCOUNTER — Other Ambulatory Visit: Payer: Self-pay | Admitting: Family Medicine

## 2024-05-15 NOTE — Telephone Encounter (Signed)
 Requested medication (s) are due for refill today: Yes  Requested medication (s) are on the active medication list: Yes  Last refill:  04/21/24  Future visit scheduled: Yes  Notes to clinic:  Unable to refill due to no refill protocol for this medication.      Requested Prescriptions  Pending Prescriptions Disp Refills   MOUNJARO  7.5 MG/0.5ML Pen [Pharmacy Med Name: MOUNJARO  PEN 0.5ML 4'S 7.5MG ] 2 mL 12    Sig: INJECT 7.5 MG UNDER THE SKIN WEEKLY     Off-Protocol Failed - 05/15/2024 10:42 PM      Failed - Medication not assigned to a protocol, review manually.      Passed - Valid encounter within last 12 months    Recent Outpatient Visits           1 month ago Controlled type 2 diabetes mellitus with complication, without long-term current use of insulin (HCC)   [facility] [place], [name] P, DO   5 months ago Controlled type 2 diabetes mellitus with complication, without long-term current use of insulin [facility])   [place] [facility] [place], Duwaine SQUIBB, DO       Future Appointments             In 3 months Vaillancourt, Samantha, PA-C [facility] [place]   In 6 months Hester Alm BROCKS, MD [facility]

## 2024-05-17 ENCOUNTER — Ambulatory Visit

## 2024-05-24 ENCOUNTER — Ambulatory Visit (INDEPENDENT_AMBULATORY_CARE_PROVIDER_SITE_OTHER)

## 2024-05-24 DIAGNOSIS — Z23 Encounter for immunization: Secondary | ICD-10-CM | POA: Diagnosis not present

## 2024-05-24 DIAGNOSIS — E538 Deficiency of other specified B group vitamins: Secondary | ICD-10-CM | POA: Diagnosis not present

## 2024-05-24 MED ORDER — CYANOCOBALAMIN 1000 MCG/ML IJ SOLN
1000.0000 ug | Freq: Once | INTRAMUSCULAR | Status: AC
  Administered 2024-05-24: 1000 ug via INTRAMUSCULAR

## 2024-05-24 NOTE — Progress Notes (Signed)
 Patient is in office today for a nurse visit for B12 Injection. Patient Injection was given in the  Right deltoid. Patient tolerated injection well.  Patient is in office today for a nurse visit for Flu vaccine. Patient Injection was given in the  Left deltoid. Patient tolerated injection well.

## 2024-06-21 ENCOUNTER — Ambulatory Visit

## 2024-06-21 DIAGNOSIS — E538 Deficiency of other specified B group vitamins: Secondary | ICD-10-CM

## 2024-06-21 MED ORDER — CYANOCOBALAMIN 1000 MCG/ML IJ SOLN
1000.0000 ug | Freq: Once | INTRAMUSCULAR | Status: AC
  Administered 2024-06-21: 1000 ug via INTRAMUSCULAR

## 2024-06-21 NOTE — Progress Notes (Signed)
 Patient is in office today for a nurse visit for B12 Injection. Patient Injection was given in the  Left deltoid. Patient tolerated injection well.

## 2024-07-05 ENCOUNTER — Ambulatory Visit: Admitting: Family Medicine

## 2024-07-05 ENCOUNTER — Encounter: Payer: Self-pay | Admitting: Family Medicine

## 2024-07-05 VITALS — BP 128/58 | HR 74 | Temp 97.4°F | Ht 70.0 in

## 2024-07-05 DIAGNOSIS — E118 Type 2 diabetes mellitus with unspecified complications: Secondary | ICD-10-CM

## 2024-07-05 LAB — BAYER DCA HB A1C WAIVED: HB A1C (BAYER DCA - WAIVED): 6.5 % — ABNORMAL HIGH (ref 4.8–5.6)

## 2024-07-05 MED ORDER — MOUNJARO 7.5 MG/0.5ML ~~LOC~~ SOAJ: 7.5000 mg | SUBCUTANEOUS | 1 refills | Status: DC

## 2024-07-05 NOTE — Assessment & Plan Note (Signed)
 Doing well with A1c of 6.5. Continue current regimen. Continue to monitor. Call with any concerns.

## 2024-07-05 NOTE — Progress Notes (Signed)
 BP (!) 128/58   Pulse 74   Temp (!) 97.4 F (36.3 C) (Oral)   Ht 5' 10 (1.778 m)   SpO2 97%   BMI 25.57 kg/m    Subjective:    Patient ID: Troy Christensen, male    DOB: 1951/08/17, 73 y.o.   MRN: [id number]  HPI: Troy Christensen is a 73 y.o. male  Chief Complaint  Patient presents with   Diabetes   DIABETES Hypoglycemic episodes:no Polydipsia/polyuria: no Visual disturbance: no Chest pain: no Paresthesias: no Glucose Monitoring: yes  Accucheck frequency: occasionally Taking Insulin?: no Blood Pressure Monitoring: not checking Retinal Examination: Up to Date Foot Exam: Up to Date Diabetic Education: Completed Pneumovax: Up to Date Influenza: Up to Date Aspirin: yes  Relevant past medical, surgical, family and social history reviewed and updated as indicated. Interim medical history since our last visit reviewed. Allergies and medications reviewed and updated.  Review of Systems  Constitutional: Negative.   Respiratory: Negative.    Cardiovascular: Negative.   Musculoskeletal: Negative.   Neurological: Negative.   Psychiatric/Behavioral: Negative.      Per HPI unless specifically indicated above     Objective:    BP (!) 128/58   Pulse 74   Temp (!) 97.4 F (36.3 C) (Oral)   Ht 5' 10 (1.778 m)   SpO2 97%   BMI 25.57 kg/m   Wt Readings from Last 3 Encounters:  03/29/24 178 lb 3.2 oz (80.8 kg)  03/01/24 179 lb 4 oz (81.3 kg)  02/04/24 181 lb 12.8 oz (82.5 kg)    Physical Exam Vitals and nursing note reviewed.  Constitutional:      General: He is not in acute distress.    Appearance: Normal appearance. He is not ill-appearing, toxic-appearing or diaphoretic.  HENT:     Head: Normocephalic and atraumatic.     Right Ear: External ear normal.     Left Ear: External ear normal.     Nose: Nose normal.     Mouth/Throat:     Mouth: Mucous membranes are moist.     Pharynx: Oropharynx is clear.  Eyes:     General: No scleral icterus.        Right eye: No discharge.        Left eye: No discharge.     Extraocular Movements: Extraocular movements intact.     Conjunctiva/sclera: Conjunctivae normal.     Pupils: Pupils are equal, round, and reactive to light.  Cardiovascular:     Rate and Rhythm: Normal rate and regular rhythm.     Pulses: Normal pulses.     Heart sounds: Normal heart sounds. No murmur heard.    No friction rub. No gallop.  Pulmonary:     Effort: Pulmonary effort is normal. No respiratory distress.     Breath sounds: Normal breath sounds. No stridor. No wheezing, rhonchi or rales.  Chest:     Chest wall: No tenderness.  Musculoskeletal:        General: Normal range of motion.     Cervical back: Normal range of motion and neck supple.  Skin:    General: Skin is warm and dry.     Capillary Refill: Capillary refill takes less than 2 seconds.     Coloration: Skin is not jaundiced or pale.     Findings: No bruising, erythema, lesion or rash.  Neurological:     General: No focal deficit present.     Mental Status: He is alert and oriented  to person, place, and time. Mental status is at baseline.  Psychiatric:        Mood and Affect: Mood normal.        Behavior: Behavior normal.        Thought Content: Thought content normal.        Judgment: Judgment normal.     Results for orders placed or performed in visit on 03/31/24  HM DIABETES EYE EXAM   Collection Time: 02/09/24 12:00 AM  Result Value Ref Range   HM Diabetic Eye Exam Retinopathy (A) No Retinopathy      Assessment & Plan:   Problem List Items Addressed This Visit       Endocrine   Controlled diabetes mellitus type 2 with complications (HCC)   Doing well with A1c of 6.5. Continue current regimen. Continue to monitor. Call with any concerns.       Relevant Medications   tirzepatide  (MOUNJARO ) 7.5 MG/0.5ML Pen   Other Visit Diagnoses       Controlled type 2 diabetes mellitus with complication, without long-term current use of insulin  (HCC)    -  Primary   Relevant Medications   tirzepatide  (MOUNJARO ) 7.5 MG/0.5ML Pen   Other Relevant Orders   Bayer DCA Hb A1c Waived        Follow up plan: Return in about 2 months (around 09/16/2024) for physical.

## 2024-07-12 DIAGNOSIS — H35033 Hypertensive retinopathy, bilateral: Secondary | ICD-10-CM | POA: Diagnosis not present

## 2024-07-12 DIAGNOSIS — H401132 Primary open-angle glaucoma, bilateral, moderate stage: Secondary | ICD-10-CM | POA: Diagnosis not present

## 2024-07-12 DIAGNOSIS — H43813 Vitreous degeneration, bilateral: Secondary | ICD-10-CM | POA: Diagnosis not present

## 2024-07-12 DIAGNOSIS — E113212 Type 2 diabetes mellitus with mild nonproliferative diabetic retinopathy with macular edema, left eye: Secondary | ICD-10-CM | POA: Diagnosis not present

## 2024-07-12 DIAGNOSIS — H34832 Tributary (branch) retinal vein occlusion, left eye, with macular edema: Secondary | ICD-10-CM | POA: Diagnosis not present

## 2024-07-12 DIAGNOSIS — H04123 Dry eye syndrome of bilateral lacrimal glands: Secondary | ICD-10-CM | POA: Diagnosis not present

## 2024-07-12 DIAGNOSIS — E113291 Type 2 diabetes mellitus with mild nonproliferative diabetic retinopathy without macular edema, right eye: Secondary | ICD-10-CM | POA: Diagnosis not present

## 2024-07-12 DIAGNOSIS — H31092 Other chorioretinal scars, left eye: Secondary | ICD-10-CM | POA: Diagnosis not present

## 2024-07-14 ENCOUNTER — Other Ambulatory Visit: Payer: Self-pay | Admitting: Radiation Oncology

## 2024-07-19 ENCOUNTER — Ambulatory Visit

## 2024-07-19 DIAGNOSIS — E538 Deficiency of other specified B group vitamins: Secondary | ICD-10-CM | POA: Diagnosis not present

## 2024-07-19 MED ORDER — CYANOCOBALAMIN 1000 MCG/ML IJ SOLN
1000.0000 ug | Freq: Once | INTRAMUSCULAR | Status: AC
  Administered 2024-07-19: 1000 ug via INTRAMUSCULAR

## 2024-07-19 NOTE — Progress Notes (Addendum)
 Patient is in office today for a nurse visit for B12 Injection. Patient Injection was given in the  Left deltoid. Patient tolerated injection well.

## 2024-08-01 ENCOUNTER — Encounter: Payer: Self-pay | Admitting: Urology

## 2024-08-16 ENCOUNTER — Ambulatory Visit

## 2024-08-16 DIAGNOSIS — E538 Deficiency of other specified B group vitamins: Secondary | ICD-10-CM

## 2024-08-16 MED ORDER — CYANOCOBALAMIN 1000 MCG/ML IJ SOLN: 1000.0000 ug | Freq: Once | INTRAMUSCULAR | Status: AC

## 2024-08-16 NOTE — Progress Notes (Signed)
 Patient is in office today for a nurse visit for B12 Injection. Patient Injection was given in the  Left deltoid. Patient tolerated injection well.

## 2024-08-19 ENCOUNTER — Other Ambulatory Visit: Payer: Self-pay

## 2024-08-19 DIAGNOSIS — R350 Frequency of micturition: Secondary | ICD-10-CM

## 2024-08-19 DIAGNOSIS — C61 Malignant neoplasm of prostate: Secondary | ICD-10-CM

## 2024-08-19 DIAGNOSIS — R3915 Urgency of urination: Secondary | ICD-10-CM

## 2024-08-26 ENCOUNTER — Other Ambulatory Visit

## 2024-08-30 ENCOUNTER — Ambulatory Visit: Admitting: Physician Assistant

## 2024-08-30 ENCOUNTER — Ambulatory Visit: Admitting: Urology

## 2024-08-30 ENCOUNTER — Other Ambulatory Visit

## 2024-08-30 DIAGNOSIS — C61 Malignant neoplasm of prostate: Secondary | ICD-10-CM | POA: Diagnosis not present

## 2024-08-31 LAB — PSA: Prostate Specific Ag, Serum: 1.1 ng/mL (ref 0.0–4.0)

## 2024-09-01 ENCOUNTER — Ambulatory Visit: Admitting: Urology

## 2024-09-01 ENCOUNTER — Telehealth: Payer: Self-pay

## 2024-09-01 VITALS — BP 126/71 | HR 83 | Ht 70.0 in | Wt 176.8 lb

## 2024-09-01 DIAGNOSIS — R3915 Urgency of urination: Secondary | ICD-10-CM

## 2024-09-01 DIAGNOSIS — C61 Malignant neoplasm of prostate: Secondary | ICD-10-CM | POA: Diagnosis not present

## 2024-09-01 DIAGNOSIS — R350 Frequency of micturition: Secondary | ICD-10-CM

## 2024-09-01 LAB — BLADDER SCAN AMB NON-IMAGING

## 2024-09-01 NOTE — Patient Instructions (Addendum)
 We will order a PET scan to see if there is any prostate cancer that has returned.  Please let us  know if you are taking Detrol (tolterodine ) which is an overactive bladder medicine.  You should be taking both that medicine and the Flomax .  Please call [phone] or [phone] to schedule your imaging prior to your appointment.

## 2024-09-01 NOTE — Progress Notes (Signed)
° °  09/01/2024 11:42 AM   Troy Christensen 05-18-1951 [id number]  Reason for visit: Follow up prostate cancer, OAB/LUTS  History: Previously followed by Dr. 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  PSA increased to 2 in 2022, and PET scan showed recurrence at the base of the prostate.  He was treated with salvage brachytherapy July 2022, I do not see that he received any adjuvant ADT at that time.  PSA undetectable after brachytherapy. PSA has increased over the last year including 0.37 November 2024, 0.20 August 2023, 0.73 May 2025, 1.15 August 2024 Also has overactive symptoms, currently relatively well-controlled on Flomax  and Detrol   Physical Exam: BP 126/71 (BP Location: Left Arm, Patient Position: Sitting, Cuff Size: Normal)   Pulse 83   Ht 5' 10 (1.778 m)   Wt 176 lb 12.8 oz (80.2 kg)   SpO2 98%   BMI 25.37 kg/m   Imaging/labs: See HPI for PSA history  Today: Urinary symptoms overall well-controlled, continues on Flomax .  He is unsure if he is taking the Detrol .  Occasionally has urge incontinence but symptoms overall stable.  IPSS score today 7, quality-of-life mostly satisfied, PVR normal at 0ml Denies any gross hematuria, dysuria, weight loss  Plan:   Prostate cancer: High concern for recurrence based on persistently increasing PSA over the last year, recommended PSMA PET scan for further evaluation.  If found to have recurrence will refer to medical oncology for consideration of other treatment options.  Not a candidate for surgery after radiation x 2. OAB/LUTS: Overall well-controlled, recommended avoiding bladder irritants, continue Flomax -he is unsure if he is taking the Detrol  and I recommended resuming that if he has not Called with PSMA PET scan results  I spent 40 total minutes on the day of the encounter including pre-visit review of the medical record,  face-to-face time with the patient, and post visit ordering of labs/imaging/tests.  Extensive review of prior urologic records, radiation oncology records, prior PET scan, and conversation with patient about concerns for recurrence of prostate cancer after radiation x 2.  Troy JAYSON Burnet, MD  [facility] [address] [place], [place] [address] [phone]

## 2024-09-01 NOTE — Telephone Encounter (Signed)
 Pt was seen this morning. Pt called to confirm he is taking tolterodine  4mg  and flomax  0.4mg 

## 2024-09-02 NOTE — Telephone Encounter (Signed)
 Pt informed, pt voiced understanding.

## 2024-09-13 ENCOUNTER — Ambulatory Visit
Admission: RE | Admit: 2024-09-13 | Discharge: 2024-09-13 | Disposition: A | Discharge status: Home / Self Care | Source: Ambulatory Visit | Attending: Urology | Admitting: Urology

## 2024-09-13 DIAGNOSIS — Z923 Personal history of irradiation: Secondary | ICD-10-CM | POA: Diagnosis not present

## 2024-09-13 DIAGNOSIS — C61 Malignant neoplasm of prostate: Secondary | ICD-10-CM | POA: Diagnosis present

## 2024-09-13 DIAGNOSIS — C7982 Secondary malignant neoplasm of genital organs: Secondary | ICD-10-CM | POA: Insufficient documentation

## 2024-09-13 MED ORDER — FLOTUFOLASTAT F 18 GALLIUM 296-5846 MBQ/ML IV SOLN
7.9800 | Freq: Once | INTRAVENOUS | Status: AC
  Administered 2024-09-13: 7.98 via INTRAVENOUS

## 2024-09-19 ENCOUNTER — Ambulatory Visit

## 2024-09-20 ENCOUNTER — Encounter: Payer: Self-pay | Admitting: Family Medicine

## 2024-09-20 ENCOUNTER — Ambulatory Visit (INDEPENDENT_AMBULATORY_CARE_PROVIDER_SITE_OTHER): Admitting: Family Medicine

## 2024-09-20 ENCOUNTER — Ambulatory Visit

## 2024-09-20 ENCOUNTER — Other Ambulatory Visit: Payer: Self-pay

## 2024-09-20 VITALS — BP 120/73 | HR 72 | Temp 98.0°F | Ht 70.0 in | Wt 184.6 lb

## 2024-09-20 DIAGNOSIS — E785 Hyperlipidemia, unspecified: Secondary | ICD-10-CM | POA: Diagnosis not present

## 2024-09-20 DIAGNOSIS — R809 Proteinuria, unspecified: Secondary | ICD-10-CM

## 2024-09-20 DIAGNOSIS — Z7985 Long-term (current) use of injectable non-insulin antidiabetic drugs: Secondary | ICD-10-CM

## 2024-09-20 DIAGNOSIS — C61 Malignant neoplasm of prostate: Secondary | ICD-10-CM | POA: Diagnosis not present

## 2024-09-20 DIAGNOSIS — E1129 Type 2 diabetes mellitus with other diabetic kidney complication: Secondary | ICD-10-CM | POA: Diagnosis not present

## 2024-09-20 DIAGNOSIS — I10 Essential (primary) hypertension: Secondary | ICD-10-CM | POA: Diagnosis not present

## 2024-09-20 DIAGNOSIS — E538 Deficiency of other specified B group vitamins: Secondary | ICD-10-CM | POA: Diagnosis not present

## 2024-09-20 DIAGNOSIS — M1A1790 Lead-induced chronic gout, unspecified ankle and foot, without tophus (tophi): Secondary | ICD-10-CM | POA: Diagnosis not present

## 2024-09-20 DIAGNOSIS — T560X1D Toxic effect of lead and its compounds, accidental (unintentional), subsequent encounter: Secondary | ICD-10-CM | POA: Diagnosis not present

## 2024-09-20 DIAGNOSIS — Z0000 Encounter for general adult medical examination without abnormal findings: Secondary | ICD-10-CM

## 2024-09-20 LAB — MICROALBUMIN, URINE WAIVED
Creatinine, Urine Waived: 200 mg/dL (ref 10–300)
Microalb, Ur Waived: 80 mg/L — ABNORMAL HIGH (ref 0–19)

## 2024-09-20 LAB — BAYER DCA HB A1C WAIVED: HB A1C (BAYER DCA - WAIVED): 6.4 % — ABNORMAL HIGH (ref 4.8–5.6)

## 2024-09-20 MED ORDER — BLOOD GLUCOSE TEST VI STRP: 1.0000 | ORAL_STRIP | Freq: Two times a day (BID) | 3 refills | Status: AC

## 2024-09-20 MED ORDER — MOUNJARO 7.5 MG/0.5ML ~~LOC~~ SOAJ: 7.5000 mg | SUBCUTANEOUS | 1 refills | Status: AC

## 2024-09-20 MED ORDER — ALLOPURINOL 100 MG PO TABS: 100.0000 mg | ORAL_TABLET | Freq: Every day | ORAL | 1 refills | Status: AC

## 2024-09-20 MED ORDER — METFORMIN HCL ER 500 MG PO TB24: 1000.0000 mg | ORAL_TABLET | Freq: Two times a day (BID) | ORAL | 1 refills | Status: AC

## 2024-09-20 MED ORDER — LOVASTATIN 40 MG PO TABS: 40.0000 mg | ORAL_TABLET | Freq: Every day | ORAL | 1 refills | Status: AC

## 2024-09-20 MED ORDER — AMLODIPINE BESY-BENAZEPRIL HCL 5-20 MG PO CAPS: ORAL_CAPSULE | ORAL | 1 refills | Status: AC

## 2024-09-20 NOTE — Patient Instructions (Addendum)
 Troy Christensen,  Thank you for taking the time for your Medicare Wellness Visit. I appreciate your continued commitment to your health goals. Please review the care plan we discussed, and feel free to reach out if I can assist you further.  Please note that Annual Wellness Visits do not include a physical exam. Some assessments may be limited, especially if the visit was conducted virtually. If needed, we may recommend an in-person follow-up with your provider.  Ongoing Care Seeing your primary care provider every 3 to 6 months helps us  monitor your health and provide consistent, personalized care.   Referrals If a referral was made during today's visit and you haven't received any updates within two weeks, please contact the referred provider directly to check on the status.  Recommended Screenings:  Health Maintenance  Topic Date Due   Yearly kidney health urinalysis for diabetes  09/14/2024   Medicare Annual Wellness Visit  09/14/2024   Complete foot exam   09/14/2024   Colon Cancer Screening  12/20/2024   Hemoglobin A1C  01/03/2025   Yearly kidney function blood test for diabetes  03/29/2025   Eye exam for diabetics  07/12/2025   DTaP/Tdap/Td vaccine (3 - Td or Tdap) 08/14/2031   Pneumococcal Vaccine for age over 78  Completed   Flu Shot  Completed   Hepatitis C Screening  Completed   Zoster (Shingles) Vaccine  Completed   Meningitis B Vaccine  Aged Out   COVID-19 Vaccine  Discontinued       09/20/2024    9:51 AM  Advanced Directives  Does Patient Have a Medical Advance Directive? Yes  Type of Estate Agent of [place];Living will  Copy of Healthcare Power of Attorney in Chart? No - copy requested    Vision: Annual vision screenings are recommended for early detection of glaucoma, cataracts, and diabetic retinopathy. These exams can also reveal signs of chronic conditions such as diabetes and high blood pressure.  Dental: Annual dental screenings help  detect early signs of oral cancer, gum disease, and other conditions linked to overall health, including heart disease and diabetes.  Please see the attached documents for additional preventive care recommendations.   Preventative Services:  Health Risk Assessment and Personalized Prevention Plan: done today Bone Mass Measurements:N/A CVD Screening: Done today Colon Cancer Screening: Refused Depression Screening: done today Diabetes Screening: done today Glaucoma Screening: see your eye doctor Hepatitis B vaccine: N/A Hepatitis C screening: up to date HIV Screening: up to date Flu Vaccine:up to date Lung cancer Screening: N/A Obesity Screening: done today Pneumonia Vaccines (2): up to date STI Screening: N/A PSA screening:Done today

## 2024-09-20 NOTE — Assessment & Plan Note (Signed)
 Under good control on current regimen. Continue current regimen. Continue to monitor. Call with any concerns. Refills given. Labs drawn today.

## 2024-09-20 NOTE — Assessment & Plan Note (Signed)
 Continue to follow with oncology. Call with any concerns. PSA drawn today.

## 2024-09-20 NOTE — Assessment & Plan Note (Signed)
 Doing well with A1c of 6.4. Continue diet and exercise. Call with any concerns. Refills given.

## 2024-09-20 NOTE — Progress Notes (Signed)
 "  BP 120/73 (BP Location: Left Arm, Cuff Size: Normal)   Pulse 72   Temp 98 F (36.7 C) (Oral)   Ht 5' 10 (1.778 m)   Wt 184 lb 9.6 oz (83.7 kg)   SpO2 97%   BMI 26.49 kg/m    Subjective:    Patient ID: Troy Christensen, male    DOB: Oct 04, 1950, 74 y.o.   MRN: [id number]  HPI: Troy Christensen is a 74 y.o. male presenting on 09/20/2024 for comprehensive medical examination. Current medical complaints include:  DIABETES Hypoglycemic episodes:no Polydipsia/polyuria: no Visual disturbance: no Chest pain: no Paresthesias: no Glucose Monitoring: no Taking Insulin?: no Blood Pressure Monitoring: a few times a month Retinal Examination: Up to Date Foot Exam: Up to Date Diabetic Education: Completed Pneumovax: Up to Date Influenza: Up to Date Aspirin: yes  No gout flares. Tolerating his medicine well. No concerns.   HYPERTENSION / HYPERLIPIDEMIA Satisfied with current treatment? yes Duration of hypertension: chronic BP monitoring frequency: a few times a month BP medication side effects: no Past BP meds: amlodipine , benazepril  Duration of hyperlipidemia: chronic Cholesterol medication side effects: no Cholesterol supplements: none Past cholesterol medications: lovastatin  Medication compliance: excellent compliance Aspirin: yes Recent stressors: no Recurrent headaches: no Visual changes: no Palpitations: no Dyspnea: no Chest pain: no Lower extremity edema: no Dizzy/lightheaded: no   He currently lives with: wife Interim Problems from his last visit: no  Functional Status Survey: Is the patient deaf or have difficulty hearing?: No Does the patient have difficulty seeing, even when wearing glasses/contacts?: No Does the patient have difficulty concentrating, remembering, or making decisions?: No Does the patient have difficulty dressing or bathing?: No Does the patient have difficulty doing errands alone such as visiting a doctor's office or shopping?: No  FALL  RISK:    09/20/2024    9:51 AM 09/15/2023   11:41 AM 06/09/2023    8:48 AM 03/12/2023    8:43 AM 01/15/2023    8:56 AM  Fall Risk   Falls in the past year? 0 0 0 0 0  Number falls in past yr: 1 0 0 0 0  Injury with Fall? 0 0  0  0  0   Risk for fall due to : No Fall Risks  No Fall Risks No Fall Risks No Fall Risks  Follow up Falls evaluation completed  Falls evaluation completed Falls evaluation completed Falls evaluation completed     Data saved with a previous flowsheet row definition    Depression Screen    09/20/2024    9:50 AM 12/17/2023    8:48 AM 12/17/2023    8:46 AM 09/15/2023   11:41 AM 06/09/2023    8:48 AM  Depression screen PHQ 2/9  Decreased Interest 0 0 0 0 0  Down, Depressed, Hopeless 0 0 0 0 0  PHQ - 2 Score 0 0 0 0 0  Altered sleeping 0 0 0  0  Tired, decreased energy 0 0 0  0  Change in appetite 0 0 0  0  Feeling bad or failure about yourself  0 0 0  0  Trouble concentrating 0 0 0  0  Moving slowly or fidgety/restless 0 0 0  0  Suicidal thoughts 0 0 0  0  PHQ-9 Score 0 0  0   0   Difficult doing work/chores Not difficult at all Not difficult at all   Not difficult at all     Data saved with a previous  flowsheet row definition    Advanced Directives Does patient have a HCPOA?    no If yes, name and contact information:  Does patient have a living will or MOST form?  no  Past Medical History:  Past Medical History:  Diagnosis Date   Actinic keratosis    Cancer (HCC)    Prostate   Diabetes mellitus without complication (HCC)    Glaucoma    Gout    Hypercholesteremia    Hypertension    Osteoarthritis    hands   Prostate cancer [facility])     Surgical History:  Past Surgical History:  Procedure Laterality Date   EYE SURGERY Bilateral    march and april 2021- [place]    FLEXIBLE SIGMOIDOSCOPY N/A 01/10/2016   Procedure: FLEXIBLE SIGMOIDOSCOPY with  argon plasma coagulation;  Surgeon: Rogelia Copping, MD;  Location: [facility];  Service:  Endoscopy;  Laterality: N/A;  Diabetic - oral meds   HERNIA REPAIR     JOINT REPLACEMENT  2014   Right Knee Replacement   MENISCUS REPAIR Left    nephrolithiasis     pt denies   PROSTATE SURGERY     Radiation treatments - no surgery   RADIOACTIVE SEED IMPLANT N/A 03/25/2021   Procedure: RADIOACTIVE SEED IMPLANT/BRACHYTHERAPY IMPLANT;  Surgeon: Penne Knee, MD;  Location: [facility];  Service: Urology;  Laterality: N/A;   RETINAL DETACHMENT SURGERY Bilateral    One surgery in March 2021, and Second in April 2021   ROTATOR CUFF REPAIR Right 2012    Medications:  Current Outpatient Medications on File Prior to Visit  Medication Sig   aspirin EC 81 MG tablet Take 81 mg by mouth 3 (three) times a week. Pt states 2-3 times weekly   Glucose Blood (BLOOD GLUCOSE TEST STRIPS) STRP 1 each by In Vitro route in the morning and at bedtime. May substitute to any manufacturer covered by patient's insurance.   Lancets Misc. MISC 1 each by Does not apply route in the morning and at bedtime. May substitute to any manufacturer covered by patient's insurance.   Omega-3 Fatty Acids (FISH OIL PO) Take 1 capsule by mouth daily.   tamsulosin  (FLOMAX ) 0.4 MG CAPS capsule TAKE 1 CAPSULE DAILY AFTER SUPPER   timolol (TIMOPTIC) 0.5 % ophthalmic solution Place 1 drop into both eyes 2 (two) times daily.   tolterodine  (DETROL  LA) 4 MG 24 hr capsule Take 1 capsule (4 mg total) by mouth daily.   Current Facility-Administered Medications on File Prior to Visit  Medication   ceFAZolin  (ANCEF ) 2 g in dextrose  5 % 100 mL IVPB   cyanocobalamin  (VITAMIN B12) injection 1,000 mcg    Allergies:  Allergies[1]  Social History:  Social History   Socioeconomic History   Marital status: Married    Spouse name: Not on file   Number of children: Not on file   Years of education: Not on file   Highest education level: Not on file  Occupational History   Occupation: retired  Tobacco Use   Smoking status: Former     Current packs/day: 0.00    Types: Cigarettes    Quit date: 03/26/1975    Years since quitting: 49.5    Passive exposure: Past   Smokeless tobacco: Never  Vaping Use   Vaping status: Never Used  Substance and Sexual Activity   Alcohol use: No    Alcohol/week: 0.0 standard drinks of alcohol   Drug use: No   Sexual activity: Not Currently  Other Topics  Concern   Not on file  Social History Narrative   L[place]; with wife; 5 children. 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 Quit smoking 50 years; no alcohol.    Social Drivers of Health   Tobacco Use: Medium Risk (09/20/2024)   Patient History    Smoking Tobacco Use: Former    Smokeless Tobacco Use: Never    Passive Exposure: Past  Physicist, Medical Strain: Low Risk (03/29/2024)   Overall Financial Resource Strain (CARDIA)    Difficulty of Paying Living Expenses: Not hard at all  Food Insecurity: No Food Insecurity (09/20/2024)   ACO Reach    Worried About Running Out of Food in the Last Year: No    Ran Out of Food in the Last Year: No  Transportation Needs: No Transportation Needs (09/20/2024)   ACO Reach    Lack of Transportation: No  Physical Activity: Inactive (03/29/2024)   Exercise Vital Sign    Days of Exercise per Week: 0 days    Minutes of Exercise per Session: 0 min  Stress: No Stress Concern Present (03/29/2024)   [employer] of Occupational Health - Occupational Stress Questionnaire    Feeling of Stress: Not at all  Social Connections: Moderately Integrated (03/29/2024)   Social Connection and Isolation Panel    Frequency of Communication with Friends and Family: More than three times a week    Frequency of Social Gatherings with Friends and Family: Twice a week    Attends Religious Services: More than 4 times per year    Active Member of [employer] or Organizations: No    Attends Banker Meetings: Never    Marital Status: Married  Catering Manager Violence: At Risk (09/20/2024)   ACO  Reach    Feels Physically and Emotionally Safe: No    Physically Hurt by Someone: No    Humiliated or Emotionally Abused by Someone: No  Depression (PHQ2-9): Low Risk (09/20/2024)   Depression (PHQ2-9)    PHQ-2 Score: 0  Alcohol Screen: Low Risk (03/29/2024)   Alcohol Screen    Last Alcohol Screening Score (AUDIT): 0  Housing: At Risk (09/20/2024)   ACO Reach    Has Housing: No    Worried About Losing Housing: No    Unable to Get Utilities: Not on file  Utilities: At Risk (09/20/2024)   ACO Reach    Has Housing: No    Worried About Losing Housing: No    Unable to Get Utilities: Not on file  Health Literacy: Adequate Health Literacy (03/29/2024)   B1300 Health Literacy    Frequency of need for help with medical instructions: Never   Tobacco Use History[2] Social History   Substance and Sexual Activity  Alcohol Use No   Alcohol/week: 0.0 standard drinks of alcohol    Family History:  Family History  Problem Relation Age of Onset   Cancer Father 58       lung   Emphysema Mother    Dementia Mother    Cancer Brother        melanoma    Past medical history, surgical history, medications, allergies, family history and social history reviewed with patient today and changes made to appropriate areas of the chart.   Review of Systems  Constitutional: Negative.   HENT: Negative.    Eyes: Negative.   Respiratory: Negative.    Cardiovascular: Negative.   Gastrointestinal: Negative.   Genitourinary: Negative.   Musculoskeletal: Negative.   Skin: Negative.   Neurological: Negative.  Endo/Heme/Allergies: Negative.   Psychiatric/Behavioral: Negative.     All other ROS negative except what is listed above and in the HPI.      Objective:    BP 120/73 (BP Location: Left Arm, Cuff Size: Normal)   Pulse 72   Temp 98 F (36.7 C) (Oral)   Ht 5' 10 (1.778 m)   Wt 184 lb 9.6 oz (83.7 kg)   SpO2 97%   BMI 26.49 kg/m   Wt Readings from Last 3 Encounters:  09/20/24 184 lb 9.6  oz (83.7 kg)  09/01/24 176 lb 12.8 oz (80.2 kg)  03/29/24 178 lb 3.2 oz (80.8 kg)    No results found.  Physical Exam Vitals and nursing note reviewed.  Constitutional:      General: He is not in acute distress.    Appearance: Normal appearance. He is not ill-appearing, toxic-appearing or diaphoretic.  HENT:     Head: Normocephalic and atraumatic.     Right Ear: Tympanic membrane, ear canal and external ear normal. There is no impacted cerumen.     Left Ear: Tympanic membrane, ear canal and external ear normal. There is no impacted cerumen.     Nose: Nose normal. No congestion or rhinorrhea.     Mouth/Throat:     Mouth: Mucous membranes are moist.     Pharynx: Oropharynx is clear. No oropharyngeal exudate or posterior oropharyngeal erythema.  Eyes:     General: No scleral icterus.       Right eye: No discharge.        Left eye: No discharge.     Extraocular Movements: Extraocular movements intact.     Conjunctiva/sclera: Conjunctivae normal.     Pupils: Pupils are equal, round, and reactive to light.  Neck:     Vascular: No carotid bruit.  Cardiovascular:     Rate and Rhythm: Normal rate and regular rhythm.     Pulses: Normal pulses.     Heart sounds: No murmur heard.    No friction rub. No gallop.  Pulmonary:     Effort: Pulmonary effort is normal. No respiratory distress.     Breath sounds: Normal breath sounds. No stridor. No wheezing, rhonchi or rales.  Chest:     Chest wall: No tenderness.  Abdominal:     General: Abdomen is flat. Bowel sounds are normal. There is no distension.     Palpations: Abdomen is soft. There is no mass.     Tenderness: There is no abdominal tenderness. There is no right CVA tenderness, left CVA tenderness, guarding or rebound.     Hernia: No hernia is present.  Genitourinary:    Comments: Genital exam deferred with shared decision making Musculoskeletal:        General: No swelling, tenderness, deformity or signs of injury.     Cervical  back: Normal range of motion and neck supple. No rigidity. No muscular tenderness.     Right lower leg: No edema.     Left lower leg: No edema.  Lymphadenopathy:     Cervical: No cervical adenopathy.  Skin:    General: Skin is warm and dry.     Capillary Refill: Capillary refill takes less than 2 seconds.     Coloration: Skin is not jaundiced or pale.     Findings: No bruising, erythema, lesion or rash.  Neurological:     General: No focal deficit present.     Mental Status: He is alert and oriented to person, place, and time.  Cranial Nerves: No cranial nerve deficit.     Sensory: No sensory deficit.     Motor: No weakness.     Coordination: Coordination normal.     Gait: Gait normal.     Deep Tendon Reflexes: Reflexes normal.  Psychiatric:        Mood and Affect: Mood normal.        Behavior: Behavior normal.        Thought Content: Thought content normal.        Judgment: Judgment normal.        09/15/2023   11:42 AM 06/09/2022   10:10 AM 06/07/2021    4:32 PM 10/25/2018   11:26 AM 10/15/2017    8:39 AM  6CIT Screen  What Year? 0 points 0 points 0 points 0 points 0 points  What month? 0 points 0 points 0 points 0 points 0 points  What time? 0 points 0 points 0 points 0 points 0 points  Count back from 20 0 points 0 points 0 points 0 points 0 points  Months in reverse 0 points 0 points 0 points 0 points 0 points  Repeat phrase 4 points 0 points 0 points 0 points 0 points  Total Score 4 points 0 points 0 points 0 points 0 points    Results for orders placed or performed in visit on 09/01/24  Bladder Scan (Post Void Residual) in office   Collection Time: 09/01/24 11:39 AM  Result Value Ref Range   Scan Result 0ml       Assessment & Plan:   Problem List Items Addressed This Visit       Cardiovascular and Mediastinum   Hypertension   Under good control on current regimen. Continue current regimen. Continue to monitor. Call with any concerns. Refills given. Labs  drawn today.        Relevant Medications   amLODipine -benazepril  (LOTREL) 5-20 MG capsule   lovastatin  (MEVACOR ) 40 MG tablet   Other Relevant Orders   Comprehensive metabolic panel with GFR   CBC with Differential/Platelet   TSH     Endocrine   Controlled type 2 diabetes mellitus with microalbuminuria (HCC)   Doing well with A1c of 6.4. Continue diet and exercise. Call with any concerns. Refills given.       Relevant Medications   amLODipine -benazepril  (LOTREL) 5-20 MG capsule   lovastatin  (MEVACOR ) 40 MG tablet   metFORMIN  (GLUCOPHAGE -XR) 500 MG 24 hr tablet   tirzepatide  (MOUNJARO ) 7.5 MG/0.5ML Pen   Other Relevant Orders   Comprehensive metabolic panel with GFR   CBC with Differential/Platelet   Bayer DCA Hb A1c Waived   Microalbumin, Urine Waived     Genitourinary   Prostate cancer (HCC)   Continue to follow with oncology. Call with any concerns. PSA drawn today.       Relevant Medications   allopurinol  (ZYLOPRIM ) 100 MG tablet   Other Relevant Orders   Comprehensive metabolic panel with GFR   CBC with Differential/Platelet   PSA     Other   Hyperlipidemia   Under good control on current regimen. Continue current regimen. Continue to monitor. Call with any concerns. Refills given. Labs drawn today.       Relevant Medications   amLODipine -benazepril  (LOTREL) 5-20 MG capsule   lovastatin  (MEVACOR ) 40 MG tablet   Other Relevant Orders   Comprehensive metabolic panel with GFR   CBC with Differential/Platelet   Lipid Panel w/o Chol/HDL Ratio   Gout   Under good control on  current regimen. Continue current regimen. Continue to monitor. Call with any concerns. Refills given. Labs drawn today.       Relevant Medications   allopurinol  (ZYLOPRIM ) 100 MG tablet   Other Relevant Orders   Comprehensive metabolic panel with GFR   CBC with Differential/Platelet   Uric acid   B12 deficiency   Under good control on current regimen. Continue current regimen. Continue  to monitor. Call with any concerns. Refills given. Labs drawn today.       Relevant Orders   Comprehensive metabolic panel with GFR   CBC with Differential/Platelet   B12   Other Visit Diagnoses       Encounter for Medicare annual wellness exam    -  Primary   Preventative care discussed today as below. Call with any concerns.        Preventative Services:  Health Risk Assessment and Personalized Prevention Plan: done today Bone Mass Measurements:N/A CVD Screening: Done today Colon Cancer Screening: Refused Depression Screening: done today Diabetes Screening: done today Glaucoma Screening: see your eye doctor Hepatitis B vaccine: N/A Hepatitis C screening: up to date HIV Screening: up to date Flu Vaccine:up to date Lung cancer Screening: N/A Obesity Screening: done today Pneumonia Vaccines (2): up to date STI Screening: N/A PSA screening:Done today  Discussed aspirin prophylaxis for myocardial infarction prevention and decision was made to continue ASA  LABORATORY TESTING:  Health maintenance labs ordered today as discussed above.   The natural history of prostate cancer and ongoing controversy regarding screening and potential treatment outcomes of prostate cancer has been discussed with the patient. The meaning of a false positive PSA and a false negative PSA has been discussed. He indicates understanding of the limitations of this screening test and wishes to proceed with screening PSA testing.   IMMUNIZATIONS:   - Tdap: Tetanus vaccination status reviewed: last tetanus booster within 10 years. - Influenza: Up to date - Pneumovax: Up to date - Prevnar: Up to date - Zostavax vaccine: Up to date  SCREENING: - Colonoscopy: Ordered today  Discussed with patient purpose of the colonoscopy is to detect colon cancer at curable precancerous or early stages   PATIENT COUNSELING:    Sexuality: Discussed sexually transmitted diseases, partner selection, use of condoms,  avoidance of unintended pregnancy  and contraceptive alternatives.   Advised to avoid cigarette smoking.  I discussed with the patient that most people either abstain from alcohol or drink within safe limits (<=14/week and <=4 drinks/occasion for males, <=7/weeks and <= 3 drinks/occasion for females) and that the risk for alcohol disorders and other health effects rises proportionally with the number of drinks per week and how often a drinker exceeds daily limits.  Discussed cessation/primary prevention of drug use and availability of treatment for abuse.   Diet: Encouraged to adjust caloric intake to maintain  or achieve ideal body weight, to reduce intake of dietary saturated fat and total fat, to limit sodium intake by avoiding high sodium foods and not adding table salt, and to maintain adequate dietary potassium and calcium preferably from fresh fruits, vegetables, and low-fat dairy products.    stressed the importance of regular exercise  Injury prevention: Discussed safety belts, safety helmets, smoke detector, smoking near bedding or upholstery.   Dental health: Discussed importance of regular tooth brushing, flossing, and dental visits.   Follow up plan: NEXT PREVENTATIVE PHYSICAL DUE IN 1 YEAR. Return in about 3 months (around 12/19/2024).     [1]  Allergies Allergen Reactions  Farxiga  [Dapagliflozin ] Other (See Comments)    Urinary incontinence   Gabapentin  Other (See Comments)    Urinary incontinence   Naproxen  Other (See Comments)    Fatigue, patient states it slows him down   [2]  Social History Tobacco Use  Smoking Status Former   Current packs/day: 0.00   Types: Cigarettes   Quit date: 03/26/1975   Years since quitting: 49.5   Passive exposure: Past  Smokeless Tobacco Never   "

## 2024-09-20 NOTE — Progress Notes (Signed)
 "  Chief Complaint  Patient presents with   Medicare Wellness     Subjective:   Troy Christensen is a 74 y.o. male who presents for a Medicare Annual Wellness Visit.  Visit info / Clinical Intake: Medicare Wellness Visit Type:: Subsequent Annual Wellness Visit Persons participating in visit and providing information:: patient Medicare Wellness Visit Mode:: In-person (required for WTM) Interpreter Needed?: No Living arrangements:: lives with spouse/significant other Patient's Overall Health Status Rating: very good Typical amount of pain: none Does pain affect daily life?: no Are you currently prescribed opioids?: no  Dietary Habits and Nutritional Risks How many meals a day?: 3 Eats fruit and vegetables daily?: yes Most meals are obtained by: preparing own meals; eating out In the last 2 weeks, have you had any of the following?: none Diabetic:: (!) yes Any non-healing wounds?: no How often do you check your BS?: as needed Would you like to be referred to a Nutritionist or for Diabetic Management? : no  Functional Status Activities of Daily Living (to include ambulation/medication): Independent Ambulation: Independent Medication Administration: Independent Home Management (perform basic housework or laundry): Independent Manage your own finances?: yes Primary transportation is: driving Concerns about vision?: no *vision screening is required for WTM* Concerns about hearing?: no  Fall Screening Falls in the past year?: 0 Number of falls in past year: 1 Was there an injury with Fall?: 0 Fall Risk Category Calculator: 1 Patient Fall Risk Level: Low Fall Risk  Fall Risk Patient at Risk for Falls Due to: No Fall Risks Fall risk Follow up: Falls evaluation completed  Home and Transportation Safety: All rugs have non-skid backing?: (!) no All stairs or steps have railings?: N/A, no stairs Grab bars in the bathtub or shower?: (!) no Have non-skid surface in bathtub or  shower?: yes Good home lighting?: yes Regular seat belt use?: yes Hospital stays in the last year:: no  Cognitive Assessment Will 6CIT or Mini Cog be Completed: no 6CIT or Mini Cog Declined: patient alert, oriented, able to answer questions appropriately and recall recent events  Advance Directives (For Healthcare) Does Patient Have a Medical Advance Directive?: Yes Type of Advance Directive: Healthcare Power of [place]; Living will Copy of Healthcare Power of Attorney in Chart?: No - copy requested Copy of Living Will in Chart?: No - copy requested Would patient like information on creating a medical advance directive?: No - Patient declined  Reviewed/Updated  Reviewed/Updated: Reviewed All (Medical, Surgical, Family, Medications, Allergies, Care Teams, Patient Goals)    Allergies (verified) Farxiga  [dapagliflozin ], Gabapentin , and Naproxen    Current Medications (verified) Outpatient Encounter Medications as of 09/20/2024  Medication Sig   allopurinol  (ZYLOPRIM ) 100 MG tablet Take 1 tablet (100 mg total) by mouth daily.   amLODipine -benazepril  (LOTREL) 5-20 MG capsule TAKE 2 CAPSULES ONCE DAILY   aspirin EC 81 MG tablet Take 81 mg by mouth 3 (three) times a week. Pt states 2-3 times weekly   Glucose Blood (BLOOD GLUCOSE TEST STRIPS) STRP 1 each by In Vitro route in the morning and at bedtime. May substitute to any manufacturer covered by patient's insurance.   Lancets Misc. MISC 1 each by Does not apply route in the morning and at bedtime. May substitute to any manufacturer covered by patient's insurance.   lovastatin  (MEVACOR ) 40 MG tablet Take 1 tablet (40 mg total) by mouth daily.   metFORMIN  (GLUCOPHAGE -XR) 500 MG 24 hr tablet Take 2 tablets (1,000 mg total) by mouth 2 (two) times daily.   Omega-3  Fatty Acids (FISH OIL PO) Take 1 capsule by mouth daily.   tamsulosin  (FLOMAX ) 0.4 MG CAPS capsule TAKE 1 CAPSULE DAILY AFTER SUPPER   timolol (TIMOPTIC) 0.5 % ophthalmic solution  Place 1 drop into both eyes 2 (two) times daily.   tirzepatide  (MOUNJARO ) 7.5 MG/0.5ML Pen Inject 7.5 mg into the skin once a week.   tolterodine  (DETROL  LA) 4 MG 24 hr capsule Take 1 capsule (4 mg total) by mouth daily.   Facility-Administered Encounter Medications as of 09/20/2024  Medication   ceFAZolin  (ANCEF ) 2 g in dextrose  5 % 100 mL IVPB   cyanocobalamin  (VITAMIN B12) injection 1,000 mcg    History: Past Medical History:  Diagnosis Date   Actinic keratosis    Cancer (HCC)    Prostate   Diabetes mellitus without complication (HCC)    Glaucoma    Gout    Hypercholesteremia    Hypertension    Osteoarthritis    hands   Prostate cancer [facility])    Past Surgical History:  Procedure Laterality Date   EYE SURGERY Bilateral    march and april 2021- [place]    FLEXIBLE SIGMOIDOSCOPY N/A 01/10/2016   Procedure: FLEXIBLE SIGMOIDOSCOPY with  argon plasma coagulation;  Surgeon: Rogelia Copping, MD;  Location: [facility];  Service: Endoscopy;  Laterality: N/A;  Diabetic - oral meds   HERNIA REPAIR     JOINT REPLACEMENT  2014   Right Knee Replacement   MENISCUS REPAIR Left    nephrolithiasis     pt denies   PROSTATE SURGERY     Radiation treatments - no surgery   RADIOACTIVE SEED IMPLANT N/A 03/25/2021   Procedure: RADIOACTIVE SEED IMPLANT/BRACHYTHERAPY IMPLANT;  Surgeon: Penne Knee, MD;  Location: [facility];  Service: Urology;  Laterality: N/A;   RETINAL DETACHMENT SURGERY Bilateral    One surgery in March 2021, and Second in April 2021   ROTATOR CUFF REPAIR Right 2012   Family History  Problem Relation Age of Onset   Cancer Father 70       lung   Emphysema Mother    Dementia Mother    Cancer Brother        melanoma   Social History   Occupational History   Occupation: retired  Tobacco Use   Smoking status: Former    Current packs/day: 0.00    Types: Cigarettes    Quit date: 03/26/1975    Years since quitting: 49.5    Passive exposure: Past   Smokeless  tobacco: Never  Vaping Use   Vaping status: Never Used  Substance and Sexual Activity   Alcohol use: No    Alcohol/week: 0.0 standard drinks of alcohol   Drug use: No   Sexual activity: Not Currently   Tobacco Counseling Counseling given: Not Answered  SDOH Screenings   Food Insecurity: No Food Insecurity (09/20/2024)  Housing: At Risk (09/20/2024)  Transportation Needs: No Transportation Needs (09/20/2024)  Utilities: At Risk (09/20/2024)  Alcohol Screen: Low Risk (03/29/2024)  Depression (PHQ2-9): Low Risk (09/20/2024)  Financial Resource Strain: Low Risk (03/29/2024)  Physical Activity: Inactive (03/29/2024)  Social Connections: Moderately Integrated (03/29/2024)  Stress: No Stress Concern Present (03/29/2024)  Tobacco Use: Medium Risk (09/20/2024)  Health Literacy: Adequate Health Literacy (03/29/2024)   See flowsheets for full screening details  Depression Screen Depression Screening Exception Documentation Depression Screening Exception:: Patient refusal  PHQ 2 & 9 Depression Scale- Over the past 2 weeks, how often have you been bothered by any of the following problems? Little  interest or pleasure in doing things: 0 Feeling down, depressed, or hopeless (PHQ Adolescent also includes...irritable): 0 PHQ-2 Total Score: 0 Trouble falling or staying asleep, or sleeping too much: 0 Feeling tired or having little energy: 0 Poor appetite or overeating (PHQ Adolescent also includes...weight loss): 0 Feeling bad about yourself - or that you are a failure or have let yourself or your family down: 0 Trouble concentrating on things, such as reading the newspaper or watching television (PHQ Adolescent also includes...like school work): 0 Moving or speaking so slowly that other people could have noticed. Or the opposite - being so fidgety or restless that you have been moving around a lot more than usual: 0 Thoughts that you would be better off dead, or of hurting yourself in some way: 0 PHQ-9  Total Score: 0 If you checked off any problems, how difficult have these problems made it for you to do your work, take care of things at home, or get along with other people?: Not difficult at all     Goals Addressed   None          Objective:    Today's Vitals   09/20/24 0948 09/20/24 0951 09/20/24 0957  BP: (!) 143/76  120/73  Pulse: 77  72  Temp: 98 F (36.7 C)    TempSrc: Oral    SpO2: 97%    Weight: 184 lb 9.6 oz (83.7 kg)    Height: 5' 10 (1.778 m)    PainSc: 0-No pain 0-No pain    Body mass index is 26.49 kg/m.  Hearing/Vision screen No results found. Immunizations and Health Maintenance Health Maintenance  Topic Date Due   Diabetic kidney evaluation - Urine ACR  09/14/2024   Medicare Annual Wellness (AWV)  09/14/2024   FOOT EXAM  09/14/2024   Colonoscopy  12/20/2024   HEMOGLOBIN A1C  01/03/2025   Diabetic kidney evaluation - eGFR measurement  03/29/2025   OPHTHALMOLOGY EXAM  07/12/2025   DTaP/Tdap/Td (3 - Td or Tdap) 08/14/2031   Pneumococcal Vaccine: 50+ Years  Completed   Influenza Vaccine  Completed   Hepatitis C Screening  Completed   Zoster Vaccines- Shingrix  Completed   Meningococcal B Vaccine  Aged Out   COVID-19 Vaccine  Discontinued        Assessment/Plan:  This is a routine wellness examination for Troy Christensen.  Patient Care Team: Vicci Duwaine SQUIBB, DO as PCP - General (Family Medicine) [facility]  I have personally reviewed and noted the following in the patients chart:   Medical and social history Use of alcohol, tobacco or illicit drugs  Current medications and supplements including opioid prescriptions. Functional ability and status Nutritional status Physical activity Advanced directives List of other physicians Hospitalizations, surgeries, and ER visits in previous 12 months Vitals Screenings to include cognitive, depression, and falls Referrals and appointments  Orders Placed This Encounter   Procedures   Comprehensive metabolic panel with GFR    Has the patient fasted?:   Yes   CBC with Differential/Platelet   Lipid Panel w/o Chol/HDL Ratio    Has the patient fasted?:   Yes   PSA   TSH   Bayer DCA Hb A1c Waived   Microalbumin, Urine Waived   B12   Uric acid   In addition, I have reviewed and discussed with patient certain preventive protocols, quality metrics, and best practice recommendations. A written personalized care plan for preventive services as well as general preventive health recommendations were provided to  patient.   Marvon Shillingburg, DO   09/20/2024   No follow-ups on file.  After Visit Summary: (In Person-Printed) AVS printed and given to the patient   "

## 2024-09-21 LAB — COMPREHENSIVE METABOLIC PANEL WITH GFR
ALT: 23 IU/L (ref 0–44)
AST: 19 IU/L (ref 0–40)
Albumin: 4.5 g/dL (ref 3.8–4.8)
Alkaline Phosphatase: 70 IU/L (ref 47–123)
BUN/Creatinine Ratio: 13 (ref 10–24)
BUN: 15 mg/dL (ref 8–27)
Bilirubin Total: 0.3 mg/dL (ref 0.0–1.2)
CO2: 21 mmol/L (ref 20–29)
Calcium: 9.7 mg/dL (ref 8.6–10.2)
Chloride: 99 mmol/L (ref 96–106)
Creatinine, Ser: 1.13 mg/dL (ref 0.76–1.27)
Globulin, Total: 1.9 g/dL (ref 1.5–4.5)
Glucose: 148 mg/dL — ABNORMAL HIGH (ref 70–99)
Potassium: 4.5 mmol/L (ref 3.5–5.2)
Sodium: 138 mmol/L (ref 134–144)
Total Protein: 6.4 g/dL (ref 6.0–8.5)
eGFR: 69 mL/min/1.73

## 2024-09-21 LAB — LIPID PANEL W/O CHOL/HDL RATIO
Cholesterol, Total: 147 mg/dL (ref 100–199)
HDL: 50 mg/dL
LDL Chol Calc (NIH): 67 mg/dL (ref 0–99)
Triglycerides: 181 mg/dL — ABNORMAL HIGH (ref 0–149)
VLDL Cholesterol Cal: 30 mg/dL (ref 5–40)

## 2024-09-21 LAB — TSH: TSH: 1.22 u[IU]/mL (ref 0.450–4.500)

## 2024-09-21 LAB — CBC WITH DIFFERENTIAL/PLATELET
Basophils Absolute: 0 x10E3/uL (ref 0.0–0.2)
Basos: 1 %
EOS (ABSOLUTE): 0.4 x10E3/uL (ref 0.0–0.4)
Eos: 7 %
Hematocrit: 37.8 % (ref 37.5–51.0)
Hemoglobin: 12.4 g/dL — ABNORMAL LOW (ref 13.0–17.7)
Immature Grans (Abs): 0 x10E3/uL (ref 0.0–0.1)
Immature Granulocytes: 0 %
Lymphocytes Absolute: 1.2 x10E3/uL (ref 0.7–3.1)
Lymphs: 24 %
MCH: 32.8 pg (ref 26.6–33.0)
MCHC: 32.8 g/dL (ref 31.5–35.7)
MCV: 100 fL — ABNORMAL HIGH (ref 79–97)
Monocytes Absolute: 0.4 x10E3/uL (ref 0.1–0.9)
Monocytes: 8 %
Neutrophils Absolute: 3 x10E3/uL (ref 1.4–7.0)
Neutrophils: 60 %
Platelets: 171 x10E3/uL (ref 150–450)
RBC: 3.78 x10E6/uL — ABNORMAL LOW (ref 4.14–5.80)
RDW: 12.9 % (ref 11.6–15.4)
WBC: 5 x10E3/uL (ref 3.4–10.8)

## 2024-09-21 LAB — URIC ACID: Uric Acid: 6.2 mg/dL (ref 3.8–8.4)

## 2024-09-21 LAB — VITAMIN B12: Vitamin B-12: 357 pg/mL (ref 232–1245)

## 2024-09-21 LAB — PSA: Prostate Specific Ag, Serum: 1.1 ng/mL (ref 0.0–4.0)

## 2024-09-22 ENCOUNTER — Ambulatory Visit: Payer: Self-pay | Admitting: Urology

## 2024-09-22 DIAGNOSIS — C61 Malignant neoplasm of prostate: Secondary | ICD-10-CM

## 2024-09-22 NOTE — Telephone Encounter (Signed)
 Pt stopped by office asking about results.  He said he does not use MyChart and if someone tried to call and he didn't recognize the number, he would not have answered.

## 2024-09-22 NOTE — Telephone Encounter (Signed)
 Referral placed.

## 2024-09-23 ENCOUNTER — Other Ambulatory Visit: Payer: Self-pay | Admitting: Radiation Oncology

## 2024-09-23 NOTE — Telephone Encounter (Signed)
 Called pt informed him of the information below. Pt voiced understanding. Referral previously placed.

## 2024-09-25 ENCOUNTER — Other Ambulatory Visit: Payer: Self-pay | Admitting: Family Medicine

## 2024-09-25 DIAGNOSIS — E118 Type 2 diabetes mellitus with unspecified complications: Secondary | ICD-10-CM

## 2024-09-26 ENCOUNTER — Ambulatory Visit: Payer: Self-pay | Admitting: Family Medicine

## 2024-09-26 NOTE — Telephone Encounter (Signed)
 Requested Prescriptions  Refused Prescriptions Disp Refills   pioglitazone  (ACTOS ) 45 MG tablet [Pharmacy Med Name: PIOGLITAZONE  TABS 45MG ] 90 tablet 3    Sig: TAKE 1 TABLET DAILY     Endocrinology:  Diabetes - Glitazones - pioglitazone  Passed - 09/26/2024  4:32 PM      Passed - HBA1C is between 0 and 7.9 and within 180 days    HB A1C (BAYER DCA - WAIVED)  Date Value Ref Range Status  09/20/2024 6.4 (H) 4.8 - 5.6 % Final    Comment:             Prediabetes: 5.7 - 6.4          Diabetes: >6.4          Glycemic control for adults with diabetes: <7.0          Passed - Valid encounter within last 6 months    Recent Outpatient Visits           6 days ago Encounter for [employer] annual wellness exam   [facility] [place], [name] P, DO   2 months ago Controlled type 2 diabetes mellitus with complication, without long-term current use of insulin (HCC)   [place] [facility] [place], [name] P, DO   6 months ago Controlled type 2 diabetes mellitus with complication, without long-term current use of insulin (HCC)   [place] [facility] [place], [name] P, DO   9 months ago Controlled type 2 diabetes mellitus with complication, without long-term current use of insulin [facility])   [place] [facility] Vicci Duwaine SQUIBB, DO       Future Appointments             In 2 months Hester Alm BROCKS, MD [facility]

## 2024-09-28 ENCOUNTER — Inpatient Hospital Stay

## 2024-09-28 ENCOUNTER — Encounter: Payer: Self-pay | Admitting: Internal Medicine

## 2024-09-28 ENCOUNTER — Inpatient Hospital Stay: Attending: Internal Medicine | Admitting: Internal Medicine

## 2024-09-28 DIAGNOSIS — E7800 Pure hypercholesterolemia, unspecified: Secondary | ICD-10-CM | POA: Diagnosis not present

## 2024-09-28 DIAGNOSIS — Z801 Family history of malignant neoplasm of trachea, bronchus and lung: Secondary | ICD-10-CM | POA: Insufficient documentation

## 2024-09-28 DIAGNOSIS — I10 Essential (primary) hypertension: Secondary | ICD-10-CM | POA: Diagnosis not present

## 2024-09-28 DIAGNOSIS — Z7984 Long term (current) use of oral hypoglycemic drugs: Secondary | ICD-10-CM | POA: Insufficient documentation

## 2024-09-28 DIAGNOSIS — M1990 Unspecified osteoarthritis, unspecified site: Secondary | ICD-10-CM | POA: Insufficient documentation

## 2024-09-28 DIAGNOSIS — M109 Gout, unspecified: Secondary | ICD-10-CM | POA: Insufficient documentation

## 2024-09-28 DIAGNOSIS — L570 Actinic keratosis: Secondary | ICD-10-CM | POA: Insufficient documentation

## 2024-09-28 DIAGNOSIS — Z7985 Long-term (current) use of injectable non-insulin antidiabetic drugs: Secondary | ICD-10-CM | POA: Insufficient documentation

## 2024-09-28 DIAGNOSIS — E119 Type 2 diabetes mellitus without complications: Secondary | ICD-10-CM | POA: Insufficient documentation

## 2024-09-28 DIAGNOSIS — C61 Malignant neoplasm of prostate: Secondary | ICD-10-CM | POA: Diagnosis not present

## 2024-09-28 DIAGNOSIS — Z923 Personal history of irradiation: Secondary | ICD-10-CM | POA: Insufficient documentation

## 2024-09-28 DIAGNOSIS — Z808 Family history of malignant neoplasm of other organs or systems: Secondary | ICD-10-CM | POA: Insufficient documentation

## 2024-09-28 DIAGNOSIS — Z79899 Other long term (current) drug therapy: Secondary | ICD-10-CM | POA: Diagnosis not present

## 2024-09-28 DIAGNOSIS — Z79818 Long term (current) use of other agents affecting estrogen receptors and estrogen levels: Secondary | ICD-10-CM | POA: Insufficient documentation

## 2024-09-28 NOTE — Assessment & Plan Note (Addendum)
#   Locally recurrent prostate cancer [initial diagnosis 2016] JAN 2026- PET PSMA-  Intense radiotracer uptake in the bilateral seminal vesicles and left vas deferens, consistent with recurrent/metastatic prostate carcinoma. No evidence of metastatic lymphadenopathy or other sites of metastatic disease    # PSA trend indicates recurrence localized to prostate/seminal vesicle region without metastasis.  No role for surgery.  Also no role for radiation given his EBRT and also brachytherapy in 2022.  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 Chemotherapy not indicated. Antihormone pills as alternative if injections ineffective-or castrate resistant.  #I had a long discussion regarding the concerning findings progressive disease.  Again discussed at length the importance of starting ADT-to treat his progressive prostate cancer.  I reviewed the mechanism of action of ADT/blocking testosterone.  Again reviewed the potential side effects including but not limited to-fatigue hot flashes loss of libido.  Also reviewed that bone health/cardiovascular as long-term complications.  I would recommend Eligard   Understands treatments are palliative/local control not curative. Discussed treatments are in general indefinite; however treatment breaks were given based upon tolerance preference.    # Initiated androgen deprivation therapy with hormone injections every six months, pending insurance approval. Educated on mechanism and rationale.  - Ordered PSA check three months post-initiation to assess response.  - Coordinated treatments and follow-up, including scheduling for injections and PSA testing.      Thank you Drs.Sninski/Johnson MD   for allowing me to participate in the care of your pleasant patient. Please do not hesitate to contact me with questions or concerns in the interim.  # DISPOSITION: # Eligard - next week # follow up in 3 months- MD; 2-3 days prior-labs- cbc/cmp; PSA;- Dr.B

## 2024-09-28 NOTE — Progress Notes (Signed)
 Troy Christensen CONSULT NOTE  Patient Care Team: Troy Duwaine SQUIBB, DO as PCP - General (Family Medicine) Troy Christensen, P.A. 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  Eligard  45 mg- started.    Prostate cancer (HCC)  07/14/2017 Initial Diagnosis   Prostate cancer (HCC)   09/28/2024 Cancer Staging   Staging form: Prostate, AJCC 8th Edition - Clinical: Stage IIIB (cT3b, cN0, cM0, Grade Group: 4) - Signed by Troy Cindy SAUNDERS, MD on 09/28/2024 Gleason score: 8 Histologic grading system: 5 grade system     HISTORY OF PRESENTING ILLNESS: Walking independently.  Accompanied by his wife. Troy Christensen 74 y.o.  male with above history of prostate cancer has been referred to us  for further evaluation recommendations.  Discussed the use of AI scribe software for clinical note transcription with the patient, who gave verbal consent to proceed.  History of Present Illness   Troy Christensen is a 74 year old male with recurrent, non-metastatic prostate cancer who presents for oncology follow-up due to rising PSA and local disease recurrence.  He was initially diagnosed with prostate cancer in 2016 and treated with external beam radiation therapy (forty treatments), resulting in a decrease in PSA. Several years later, PSA began to rise, and in 2021, he underwent brachytherapy with radioactive seed implantation in the prostate region. He received external beam radiation and brachytherapy as separate treatments.  Over the past year, PSA has demonstrated a gradual  increase with values of 0.37, 0.5, 0.73, and most recently 1.1 as of January 2026. PET scan revealed local recurrence adjacent to the prostate near the seminal vesicles, without evidence of distant metastasis to bone, liver, or lungs. He remains asymptomatic, denying pain, nausea, vomiting, or other new symptoms. He is not currently taking daily antihormone therapy.  He previously received androgen deprivation therapy (Eligard  or Lupron ), with the last injection administered in 2022 and a total of three injections given approximately every six months.       Review of Systems  Constitutional:  Negative for chills, diaphoresis, fever, malaise/fatigue and weight loss.  HENT:  Negative for nosebleeds and sore throat.   Eyes:  Negative for double vision.  Respiratory:  Negative for cough, hemoptysis, sputum production, shortness of breath and wheezing.   Cardiovascular:  Negative for chest pain, palpitations, orthopnea and leg swelling.  Gastrointestinal:  Negative for abdominal pain, blood in stool, constipation, diarrhea, heartburn, melena, nausea and vomiting.  Genitourinary:  Negative for dysuria, frequency and urgency.  Musculoskeletal:  Positive for joint pain. Negative for back pain.  Skin: Negative.  Negative for itching and rash.  Neurological:  Negative for dizziness, tingling, focal weakness, weakness and headaches.  Endo/Heme/Allergies:  Does not bruise/bleed easily.  Psychiatric/Behavioral:  Negative for depression. The patient is not nervous/anxious and does not have insomnia.      MEDICAL HISTORY:  Past Medical History:  Diagnosis Date   Actinic keratosis    Cancer (HCC)    Prostate   Diabetes mellitus without complication (HCC)    Glaucoma    Gout    Hypercholesteremia    Hypertension    Osteoarthritis    hands  Prostate cancer Troy Christensen)     SURGICAL HISTORY: Past Surgical History:  Procedure Laterality Date   EYE SURGERY Bilateral    march and april 2021- [place]     FLEXIBLE SIGMOIDOSCOPY N/A 01/10/2016   Procedure: FLEXIBLE SIGMOIDOSCOPY with  argon plasma coagulation;  Surgeon: Troy Copping, MD;  Location: [facility];  Service: Endoscopy;  Laterality: N/A;  Diabetic - oral meds   HERNIA REPAIR     JOINT REPLACEMENT  2014   Right Christensen Replacement   MENISCUS REPAIR Left    nephrolithiasis     pt denies   PROSTATE SURGERY     Radiation treatments - no surgery   RADIOACTIVE SEED IMPLANT N/A 03/25/2021   Procedure: RADIOACTIVE SEED IMPLANT/BRACHYTHERAPY IMPLANT;  Surgeon: Troy Knee, MD;  Location: [facility];  Service: Urology;  Laterality: N/A;   RETINAL DETACHMENT SURGERY Bilateral    One surgery in March 2021, and Second in April 2021   ROTATOR CUFF REPAIR Right 2012    SOCIAL HISTORY: Social History   Socioeconomic History   Marital status: Married    Spouse name: Not on file   Number of children: Not on file   Years of education: Not on file   Highest education level: Not on file  Occupational History   Occupation: retired  Tobacco Use   Smoking status: Former    Current packs/day: 0.00    Types: Cigarettes    Quit date: 03/26/1975    Years since quitting: 49.5    Passive exposure: Past   Smokeless tobacco: Never  Vaping Use   Vaping status: Never Used  Substance and Sexual Activity   Alcohol use: No    Alcohol/week: 0.0 standard drinks of alcohol   Drug use: No   Sexual activity: Not Currently  Other Topics Concern   Not on file  Social History Narrative   [place]; with wife; 5 children. 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 Quit smoking 50 years; no alcohol.    Social Drivers of Health   Tobacco Use: Medium Risk (09/28/2024)   Patient History    Smoking Tobacco Use: Former    Smokeless Tobacco Use: Never    Passive Exposure: Past  Physicist, Medical Strain: Low Risk (03/29/2024)   Overall Financial Resource Strain (CARDIA)    Difficulty of Paying Living Expenses: Not hard at all  Food  Insecurity: No Food Insecurity (09/28/2024)   Epic    Worried About Programme Researcher, Broadcasting/film/video in the Last Year: Never true    Ran Out of Food in the Last Year: Never true  Transportation Needs: No Transportation Needs (09/28/2024)   Epic    Lack of Transportation (Medical): No    Lack of Transportation (Non-Medical): No  Physical Activity: Inactive (03/29/2024)   Exercise Vital Sign    Days of Exercise per Week: 0 days    Minutes of Exercise per Session: 0 min  Stress: No Stress Concern Present (03/29/2024)   [employer] of Occupational Health - Occupational Stress Questionnaire    Feeling of Stress: Not at all  Social Connections: Moderately Integrated (03/29/2024)   Social Connection and Isolation Panel    Frequency of Communication with Friends and Family: More than three times a week    Frequency of Social Gatherings with Friends and Family: Twice a week    Attends Religious Services: More than 4 times per year    Active Member of [employer] or Organizations: No    Attends Banker Meetings:  Never    Marital Status: Married  Catering Manager Violence: Not At Risk (09/28/2024)   Epic    Fear of Current or Ex-Partner: No    Emotionally Abused: No    Physically Abused: No    Sexually Abused: No  Recent Concern: Intimate Partner Violence - At Risk (09/20/2024)   ACO Reach    Feels Physically and Emotionally Safe: No    Physically Hurt by Someone: No    Humiliated or Emotionally Abused by Someone: No  Depression (PHQ2-9): Low Risk (09/28/2024)   Depression (PHQ2-9)    PHQ-2 Score: 0  Alcohol Screen: Low Risk (03/29/2024)   Alcohol Screen    Last Alcohol Screening Score (AUDIT): 0  Housing: Low Risk (09/28/2024)   Epic    Unable to Pay for Housing in the Last Year: No    Number of Times Moved in the Last Year: 0    Homeless in the Last Year: No  Recent Concern: Housing - At Risk (09/20/2024)   ACO Reach    Has Housing: No    Worried About Losing Housing: No    Unable to Get  Utilities: Not on file  Utilities: Not At Risk (09/28/2024)   Epic    Threatened with loss of utilities: No  Recent Concern: Utilities - At Risk (09/20/2024)   ACO Reach    Has Housing: No    Worried About Losing Housing: No    Unable to Get Utilities: Not on file  Health Literacy: Adequate Health Literacy (03/29/2024)   B1300 Health Literacy    Frequency of need for help with medical instructions: Never    FAMILY HISTORY: Family History  Problem Relation Age of Onset   Emphysema Mother    Dementia Mother    Cancer Father 34       lung   Cancer Brother        melanoma    ALLERGIES:  is allergic to farxiga  [dapagliflozin ], gabapentin , and naproxen .  MEDICATIONS:  Current Outpatient Medications  Medication Sig Dispense Refill   allopurinol  (ZYLOPRIM ) 100 MG tablet Take 1 tablet (100 mg total) by mouth daily. 90 tablet 1   amLODipine -benazepril  (LOTREL) 5-20 MG capsule TAKE 2 CAPSULES ONCE DAILY 180 capsule 1   aspirin EC 81 MG tablet Take 81 mg by mouth 3 (three) times a week. Pt states 2-3 times weekly     Glucose Blood (BLOOD GLUCOSE TEST STRIPS) STRP 1 each by In Vitro route in the morning and at bedtime. May substitute to any manufacturer covered by patient's insurance. 100 strip 3   Lancets Misc. MISC 1 each by Does not apply route in the morning and at bedtime. May substitute to any manufacturer covered by patient's insurance. 100 each 3   lovastatin  (MEVACOR ) 40 MG tablet Take 1 tablet (40 mg total) by mouth daily. 90 tablet 1   metFORMIN  (GLUCOPHAGE -XR) 500 MG 24 hr tablet Take 2 tablets (1,000 mg total) by mouth 2 (two) times daily. 360 tablet 1   Multiple Vitamins-Minerals (MENS MULTIVITAMIN PO) Take 1 tablet by mouth daily.     Omega-3 Fatty Acids (FISH OIL PO) Take 1 capsule by mouth daily.     tamsulosin  (FLOMAX ) 0.4 MG CAPS capsule TAKE 1 CAPSULE(0.4 MG) BY MOUTH DAILY AFTER SUPPER 30 capsule 11   timolol (TIMOPTIC) 0.5 % ophthalmic solution Place 1 drop into both eyes  2 (two) times daily.     tirzepatide  (MOUNJARO ) 7.5 MG/0.5ML Pen Inject 7.5 mg into the skin once a  week. 6 mL 1   tolterodine  (DETROL  LA) 4 MG 24 hr capsule Take 1 capsule (4 mg total) by mouth daily. 30 capsule 11   Current Facility-Administered Medications  Medication Dose Route Frequency Provider Last Rate Last Admin   ceFAZolin  (ANCEF ) 2 g in dextrose  5 % 100 mL IVPB  2 g Intravenous Q8H Troy Knee, MD       cyanocobalamin  (VITAMIN B12) injection 1,000 mcg  1,000 mcg Intramuscular Once Johnson, [name] P, DO          .  PHYSICAL EXAMINATION: ECOG PERFORMANCE STATUS: 0 - Asymptomatic  Vitals:   09/28/24 1401  BP: 124/81  Pulse: 82  Resp: 18  Temp: 97.9 F (36.6 C)  SpO2: 100%    Filed Weights   09/28/24 1401  Weight: 183 lb 12.8 oz (83.4 kg)     Physical Exam HENT:     Head: Normocephalic and atraumatic.     Mouth/Throat:     Pharynx: No oropharyngeal exudate.  Eyes:     Pupils: Pupils are equal, round, and reactive to light.  Cardiovascular:     Rate and Rhythm: Normal rate and regular rhythm.  Pulmonary:     Effort: Pulmonary effort is normal. No respiratory distress.     Breath sounds: Normal breath sounds. No wheezing.  Abdominal:     General: Bowel sounds are normal. There is no distension.     Palpations: Abdomen is soft. There is no mass.     Tenderness: There is no abdominal tenderness. There is no guarding or rebound.  Musculoskeletal:        General: No tenderness. Normal range of motion.     Cervical back: Normal range of motion and neck supple.  Skin:    General: Skin is warm.  Neurological:     Mental Status: He is alert and oriented to person, place, and time.  Psychiatric:        Mood and Affect: Affect normal.      LABORATORY DATA:  I have reviewed the data as listed Lab Results  Component Value Date   WBC 5.0 09/20/2024   HGB 12.4 (L) 09/20/2024   HCT 37.8 09/20/2024   MCV 100 (H) 09/20/2024   PLT 171 09/20/2024   Recent  Labs    03/29/24 0926 09/20/24 1001  NA 142 138  K 5.1 4.5  CL 104 99  CO2 19* 21  GLUCOSE 116* 148*  BUN 15 15  CREATININE 1.19 1.13  CALCIUM 10.0 9.7  PROT 6.5 6.4  ALBUMIN 4.5 4.5  AST 18 19  ALT 15 23  ALKPHOS 60 70  BILITOT 0.3 0.3    RADIOGRAPHIC STUDIES: I have personally reviewed the radiological images as listed and agreed with the findings in the report. NM PET (PSMA) SKULL TO MID THIGH Result Date: 09/22/2024 CLINICAL DATA:  Prostate carcinoma with biochemical recurrence. Previous radiation therapy. EXAM: NUCLEAR MEDICINE PET SKULL BASE TO THIGH TECHNIQUE: 8.0 mCi Flotufolastat (Posluma ) was injected intravenously. Full-ring PET imaging was performed from the skull base to thigh after the radiotracer. CT data was obtained and used for attenuation correction and anatomic localization. COMPARISON:  01/07/2021 PET-CT with Pylarify  FINDINGS: NECK No radiotracer activity in neck lymph nodes. Incidental CT finding: None. CHEST No radiotracer accumulation within mediastinal or hilar lymph nodes. No suspicious pulmonary nodules on the CT scan. Incidental CT finding: None. ABDOMEN/PELVIS Prostate: Brachytherapy seeds seen throughout the prostate bed. Intense radiotracer uptake seen in the seminal vesicles bilaterally, with SUV  max of 22.2, consistent with seminal vesicle involvement by recurrent tumor. A tiny focus of radiotracer uptake is also seen in the left vas deferens just superior to the left seminal vesicle on image 137. This has SUV max 3.4. Lymph nodes: No abnormal radiotracer accumulation within pelvic or abdominal nodes. Liver: No evidence of liver metastasis. Incidental CT finding: None. SKELETON No focal activity to suggest skeletal metastasis. IMPRESSION: Intense radiotracer uptake in the bilateral seminal vesicles and left vas deferens, consistent with recurrent/metastatic prostate carcinoma. No evidence of metastatic lymphadenopathy or other sites of metastatic disease.  Electronically Signed   By: Norleen DELENA Kil M.D.   On: 09/22/2024 10:43     ASSESSMENT & PLAN:   Prostate cancer (HCC) # Locally recurrent prostate cancer [initial diagnosis 2016] JAN 2026- PET PSMA-  Intense radiotracer uptake in the bilateral seminal vesicles and left vas deferens, consistent with recurrent/metastatic prostate carcinoma. No evidence of metastatic lymphadenopathy or other sites of metastatic disease    # PSA trend indicates recurrence localized to prostate/seminal vesicle region without metastasis.  No role for surgery.  Also no role for radiation given his EBRT and also brachytherapy in 2022.  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 Chemotherapy not indicated. Antihormone pills as alternative if injections ineffective-or castrate resistant.  #I had a long discussion regarding the concerning findings progressive disease.  Again discussed at length the importance of starting ADT-to treat his progressive prostate cancer.  I reviewed the mechanism of action of ADT/blocking testosterone.  Again reviewed the potential side effects including but not limited to-fatigue hot flashes loss of libido.  Also reviewed that bone health/cardiovascular as long-term complications.  I would recommend Eligard   Understands treatments are palliative/local control not curative. Discussed treatments are in general indefinite; however treatment breaks were given based upon tolerance preference.    # Initiated androgen deprivation therapy with hormone injections every six months, pending insurance approval. Educated on mechanism and rationale.  - Ordered PSA check three months post-initiation to assess response.  - Coordinated treatments and follow-up, including scheduling for injections and PSA testing.      Thank you Drs.Sninski/Johnson MD   for allowing me to participate in the care of your pleasant patient. Please do not hesitate to contact me with questions or concerns in the  interim.  # DISPOSITION: # Eligard - next week # follow up in 3 months- MD; 2-3 days prior-labs- cbc/cmp; PSA;- Dr.B   All questions were answered. The patient knows to call the clinic with any problems, questions or concerns.    Cindy JONELLE Joe, MD 09/28/2024 4:23 PM

## 2024-09-28 NOTE — Progress Notes (Signed)
 No question or concerns at this time.

## 2024-10-06 ENCOUNTER — Inpatient Hospital Stay

## 2024-10-06 DIAGNOSIS — C61 Malignant neoplasm of prostate: Secondary | ICD-10-CM | POA: Diagnosis not present

## 2024-10-06 MED ORDER — LEUPROLIDE ACETATE (6 MONTH) 45 MG ~~LOC~~ KIT
45.0000 mg | PACK | Freq: Once | SUBCUTANEOUS | Status: AC
  Administered 2024-10-06: 45 mg via SUBCUTANEOUS
  Filled 2024-10-06: qty 45

## 2024-11-02 ENCOUNTER — Inpatient Hospital Stay

## 2024-12-06 ENCOUNTER — Ambulatory Visit: Admitting: Dermatology

## 2024-12-20 ENCOUNTER — Ambulatory Visit: Admitting: Family Medicine

## 2024-12-23 ENCOUNTER — Inpatient Hospital Stay

## 2024-12-27 ENCOUNTER — Inpatient Hospital Stay: Admitting: Internal Medicine

## 2024-12-27 ENCOUNTER — Inpatient Hospital Stay

## 2025-02-02 ENCOUNTER — Ambulatory Visit: Admitting: Radiation Oncology
